# Patient Record
Sex: Female | Born: 1935
Health system: Southern US, Community
[De-identification: ages and names within clinical notes are randomized; demographics above are authoritative.]

## PROBLEM LIST (undated history)

## (undated) DIAGNOSIS — E119 Type 2 diabetes mellitus without complications: Secondary | ICD-10-CM

## (undated) DIAGNOSIS — I10 Essential (primary) hypertension: Secondary | ICD-10-CM

## (undated) HISTORY — DX: Essential (primary) hypertension: I10

## (undated) HISTORY — PX: CARDIAC VALVE REPLACEMENT: SHX585

## (undated) HISTORY — PX: CHOLECYSTECTOMY: SHX55

## (undated) HISTORY — DX: Type 2 diabetes mellitus without complications: E11.9

---

## 2013-11-04 DIAGNOSIS — I110 Hypertensive heart disease with heart failure: Secondary | ICD-10-CM | POA: Insufficient documentation

## 2013-11-04 DIAGNOSIS — I5032 Chronic diastolic (congestive) heart failure: Secondary | ICD-10-CM | POA: Insufficient documentation

## 2013-11-04 DIAGNOSIS — Z85038 Personal history of other malignant neoplasm of large intestine: Secondary | ICD-10-CM | POA: Insufficient documentation

## 2013-11-04 HISTORY — DX: Hypertensive heart disease with heart failure: I50.32

## 2013-11-04 HISTORY — DX: Personal history of other malignant neoplasm of large intestine: Z85.038

## 2013-11-04 HISTORY — DX: Hypertensive heart disease with heart failure: I11.0

## 2018-07-13 DIAGNOSIS — Z952 Presence of prosthetic heart valve: Secondary | ICD-10-CM | POA: Insufficient documentation

## 2019-09-20 ENCOUNTER — Other Ambulatory Visit: Payer: Self-pay

## 2019-09-20 ENCOUNTER — Ambulatory Visit: Payer: Medicare Other | Admitting: Sports Medicine

## 2019-09-20 ENCOUNTER — Encounter: Payer: Self-pay | Admitting: Sports Medicine

## 2019-09-20 DIAGNOSIS — M21619 Bunion of unspecified foot: Secondary | ICD-10-CM | POA: Diagnosis not present

## 2019-09-20 DIAGNOSIS — E119 Type 2 diabetes mellitus without complications: Secondary | ICD-10-CM | POA: Diagnosis not present

## 2019-09-20 DIAGNOSIS — M2141 Flat foot [pes planus] (acquired), right foot: Secondary | ICD-10-CM | POA: Diagnosis not present

## 2019-09-20 DIAGNOSIS — M2041 Other hammer toe(s) (acquired), right foot: Secondary | ICD-10-CM

## 2019-09-20 DIAGNOSIS — M2042 Other hammer toe(s) (acquired), left foot: Secondary | ICD-10-CM

## 2019-09-20 DIAGNOSIS — M2142 Flat foot [pes planus] (acquired), left foot: Secondary | ICD-10-CM

## 2019-09-20 NOTE — Progress Notes (Signed)
Subjective: Christine Newton is a 85 y.o. female patient with history of diabetes who presents to office today complaining of thick toenails unable to trim with significant discoloration per daughter.  Patient is also diabetic and is possibly interested in diabetic shoes was diagnosed with diabetes about 5 years ago.  Patient states that the glucose reading this morning was 115 mg/dl.  A1c 6.4.  Patient denies any new changes in medication or new problems  Patient is currently in physical therapy.  Review of Systems  All other systems reviewed and are negative.   There are no active problems to display for this patient.  No current outpatient medications on file prior to visit.   No current facility-administered medications on file prior to visit.    Allergies  Allergen Reactions  . Cephalosporins Nausea Only and Other (See Comments)    Other Reaction: Allergy  . Aspirin Nausea Only    No results found for this or any previous visit (from the past 2160 hour(s)).  Objective: General: Patient is awake, alert, and oriented x 3 and in no acute distress.  Integument: Skin is warm, dry and supple bilateral. Nails are  shor, thickened and dystrophic with subungual debris, consistent with onychomycosis, 1-5 bilateral. No signs of infection. No open lesions or preulcerative lesions present bilateral. Remaining integument unremarkable.  Vasculature:  Dorsalis Pedis pulse 1/4 bilateral. Posterior Tibial pulse  1/4 bilateral. Capillary fill time <3 sec 1-5 bilateral. Positive hair growth to the level of the digits. Temperature gradient within normal limits. No varicosities present bilateral. No edema present bilateral.   Neurology: The patient has intact sensation measured with a 5.07/10g Semmes Weinstein Monofilament at all pedal sites bilateral . Vibratory sensation diminished bilateral with tuning fork. No Babinski sign present bilateral.   Musculoskeletal:Aymptomatic bunion, pes planus, and  fifth hammertoe pedal deformities noted bilateral. Muscular strength 4/5 in all lower extremity muscular groups bilateral without pain on range of motion. No tenderness with calf compression bilateral.  Assessment and Plan: Problem List Items Addressed This Visit    None    Visit Diagnoses    Encounter for comprehensive diabetic foot examination, type 2 diabetes mellitus ([place])    -  Primary   Bunion       Hammer toes of both feet       Pes planus of both feet          -Examined patient. -Discussed and educated patient on diabetic foot care, especially with regards to the vascular, neurological and musculoskeletal systems.  -Stressed the importance of good glycemic control and the detriment of not controlling glucose levels in relation to the foot. -Mechanically smooth all nails 1-5 bilateral using dremel without incident  -[employer] diabetic shoe order form was completed; office to contact primary care for approval / certification;  Office to arrange shoe fitting and dispensing. -Answered all patient questions -Patient to return  in 3 months for at risk foot care and as scheduled for diabetic shoe measurements -Patient advised to call the office if any problems or questions arise in the meantime.  Landis Martins, DPM

## 2019-10-09 ENCOUNTER — Other Ambulatory Visit: Payer: Self-pay

## 2019-10-09 ENCOUNTER — Ambulatory Visit: Payer: Medicare Other | Admitting: Orthotics

## 2019-10-09 DIAGNOSIS — E119 Type 2 diabetes mellitus without complications: Secondary | ICD-10-CM

## 2019-10-09 DIAGNOSIS — M2141 Flat foot [pes planus] (acquired), right foot: Secondary | ICD-10-CM

## 2019-10-09 DIAGNOSIS — M2041 Other hammer toe(s) (acquired), right foot: Secondary | ICD-10-CM

## 2019-10-09 DIAGNOSIS — M2042 Other hammer toe(s) (acquired), left foot: Secondary | ICD-10-CM

## 2019-10-09 NOTE — Progress Notes (Signed)

## 2019-10-18 DIAGNOSIS — M25511 Pain in right shoulder: Secondary | ICD-10-CM

## 2019-10-18 DIAGNOSIS — E876 Hypokalemia: Secondary | ICD-10-CM

## 2019-10-18 DIAGNOSIS — I10 Essential (primary) hypertension: Secondary | ICD-10-CM

## 2019-10-18 DIAGNOSIS — E119 Type 2 diabetes mellitus without complications: Secondary | ICD-10-CM

## 2019-12-04 ENCOUNTER — Telehealth: Payer: Self-pay

## 2019-12-04 ENCOUNTER — Ambulatory Visit: Payer: Medicare Other | Admitting: Orthotics

## 2019-12-04 DIAGNOSIS — M2141 Flat foot [pes planus] (acquired), right foot: Secondary | ICD-10-CM

## 2019-12-04 DIAGNOSIS — M2041 Other hammer toe(s) (acquired), right foot: Secondary | ICD-10-CM

## 2019-12-04 DIAGNOSIS — M2042 Other hammer toe(s) (acquired), left foot: Secondary | ICD-10-CM

## 2019-12-04 DIAGNOSIS — E119 Type 2 diabetes mellitus without complications: Secondary | ICD-10-CM

## 2019-12-04 DIAGNOSIS — M2142 Flat foot [pes planus] (acquired), left foot: Secondary | ICD-10-CM

## 2019-12-04 NOTE — Telephone Encounter (Signed)
Dawn  How do we make sure charges are in for shoes since daughter picked up the shoes for the patient? Thanks Dr. Cannon Kettle

## 2019-12-04 NOTE — Telephone Encounter (Signed)
Siniya Searson (Pt's daughter) came in the office today to pick up Christine Newton's Diabetic shoes. We informed Vikki our shoe policy is for the  Pt to be present to try the shoes on and to go over instructions. Daughter stated Pt. Has not been out the house due to [place] and kept insisting to take them with her. We had the daughter sign release forms and was instructed that the Pt can not return shoes since she was not present to pick them up. Daughter  Received 1 pair and 3 pairs of custom molded diabetic inserts. Daughter was given verbal and written break in and wear instructions.

## 2020-02-03 ENCOUNTER — Other Ambulatory Visit: Payer: Self-pay

## 2020-02-03 MED ORDER — LISINOPRIL 20 MG PO TABS: 20.0000 mg | ORAL_TABLET | Freq: Every day | ORAL | 2 refills | Status: DC

## 2020-02-03 MED ORDER — ONETOUCH ULTRASOFT LANCETS MISC: 1.0000 | Freq: Two times a day (BID) | 2 refills | Status: DC

## 2020-02-03 MED ORDER — ONETOUCH ULTRA VI STRP: 1.0000 | ORAL_STRIP | 2 refills | Status: DC | PRN

## 2020-02-03 MED ORDER — AMLODIPINE BESYLATE 5 MG PO TABS: 5.0000 mg | ORAL_TABLET | Freq: Every day | ORAL | 2 refills | Status: DC

## 2020-06-09 ENCOUNTER — Telehealth: Payer: Self-pay

## 2020-06-09 ENCOUNTER — Other Ambulatory Visit: Payer: Self-pay | Admitting: Legal Medicine

## 2020-06-09 MED ORDER — ERYTHROMYCIN 5 MG/GM OP OINT: 1.0000 "application " | TOPICAL_OINTMENT | Freq: Every day | OPHTHALMIC | 0 refills | Status: DC

## 2020-06-09 NOTE — Telephone Encounter (Signed)
I sent in ilotycin ointment for eye lp

## 2020-06-09 NOTE — Telephone Encounter (Signed)
Patient has a stye on upper lid of left eye for 2 weeks and it started to draining. She asked for any cream antibiotic. Can you please advice?

## 2020-06-09 NOTE — Telephone Encounter (Signed)
Patient's daughter was notified.

## 2020-06-11 ENCOUNTER — Other Ambulatory Visit: Payer: Self-pay

## 2020-06-18 ENCOUNTER — Other Ambulatory Visit: Payer: Self-pay

## 2020-06-18 DIAGNOSIS — E1122 Type 2 diabetes mellitus with diabetic chronic kidney disease: Secondary | ICD-10-CM

## 2020-06-18 MED ORDER — ACCU-CHEK GUIDE W/DEVICE KIT: 1.0000 | PACK | Freq: Two times a day (BID) | 0 refills | Status: DC | PRN

## 2020-06-18 MED ORDER — GLUCOSE BLOOD VI STRP: 1.0000 | ORAL_STRIP | Freq: Two times a day (BID) | 2 refills | Status: DC | PRN

## 2020-07-20 ENCOUNTER — Other Ambulatory Visit: Payer: Self-pay

## 2020-07-20 ENCOUNTER — Other Ambulatory Visit: Payer: Self-pay | Admitting: Legal Medicine

## 2020-07-20 ENCOUNTER — Telehealth: Payer: Self-pay

## 2020-07-20 NOTE — Telephone Encounter (Signed)
I need to see first at office lp

## 2020-07-20 NOTE — Telephone Encounter (Signed)
Patient would like referral to new cardiologist.  Daneen Schick

## 2020-09-03 ENCOUNTER — Other Ambulatory Visit: Payer: Self-pay | Admitting: Legal Medicine

## 2020-09-03 ENCOUNTER — Other Ambulatory Visit: Payer: Self-pay

## 2020-09-03 MED ORDER — ACCU-CHEK SOFTCLIX LANCETS MISC
1.0000 | Freq: Every day | 2 refills | Status: DC
Start: 2020-09-03 — End: 2021-03-03

## 2020-09-03 NOTE — Telephone Encounter (Signed)
She needs to be seen for her diabetes, we have not seen this year lp

## 2020-09-17 ENCOUNTER — Encounter: Payer: Self-pay | Admitting: Legal Medicine

## 2020-09-17 ENCOUNTER — Ambulatory Visit (INDEPENDENT_AMBULATORY_CARE_PROVIDER_SITE_OTHER): Payer: Medicare PPO | Admitting: Legal Medicine

## 2020-09-17 ENCOUNTER — Other Ambulatory Visit: Payer: Self-pay

## 2020-09-17 VITALS — BP 120/60 | HR 70 | Temp 97.2°F | Resp 17 | Ht 62.6 in | Wt 134.0 lb

## 2020-09-17 DIAGNOSIS — Z6824 Body mass index (BMI) 24.0-24.9, adult: Secondary | ICD-10-CM | POA: Diagnosis not present

## 2020-09-17 DIAGNOSIS — E1159 Type 2 diabetes mellitus with other circulatory complications: Secondary | ICD-10-CM

## 2020-09-17 DIAGNOSIS — E782 Mixed hyperlipidemia: Secondary | ICD-10-CM | POA: Diagnosis not present

## 2020-09-17 DIAGNOSIS — E1169 Type 2 diabetes mellitus with other specified complication: Secondary | ICD-10-CM | POA: Diagnosis not present

## 2020-09-17 DIAGNOSIS — Z23 Encounter for immunization: Secondary | ICD-10-CM

## 2020-09-17 DIAGNOSIS — I10 Essential (primary) hypertension: Secondary | ICD-10-CM

## 2020-09-17 DIAGNOSIS — E669 Obesity, unspecified: Secondary | ICD-10-CM | POA: Insufficient documentation

## 2020-09-17 DIAGNOSIS — E559 Vitamin D deficiency, unspecified: Secondary | ICD-10-CM

## 2020-09-17 NOTE — Progress Notes (Signed)
Subjective:  Patient ID: Christine Newton, female    DOB: 1933-12-09  Age: 86 y.o. MRN: [id number]  Chief Complaint  Patient presents with   Diabetes    HPI: chronic visit  Patient presents for follow up of hypertension.  Patient tolerating lisinopril, amlodipine well with side effects.  Patient was diagnosed with hypertension 2010 so has been treated for hypertension for 10 years.Patient is working on maintaining diet and exercise regimen and follows up as directed. Complication include none.  Patient present with type 2 diabetes.  Specifically, this is type 2, noninsuln requiring diabetes, complicated by hypertension and hyperlipidemia.  Compliance with treatment has been good; patient take medicines as directed, maintains diet and exercise regimen, follows up as directed, and is keeping glucose diary.  Date of  diagnosis 2010.  Depression screen has been performed.Tobacco screen nonsmoker. Current medicines for diabetes metformin.  Patient is on lisinopril for renal protection and atorvastatin for cholesterol control.  Patient performs foot exams daily and last ophthalmologic exam was none.  Patient presents with hyperlipidemia.  Compliance with treatment has been good; patient takes medicines as directed, maintains low cholesterol diet, follows up as directed, and maintains exercise regimen.  Patient is using atorvastatin without problems.   Current Outpatient Medications on File Prior to Visit  Medication Sig Dispense Refill   Accu-Chek Softclix Lancets lancets 1 each by Other route daily. Use as instructed 100 each 2   amLODipine (NORVASC) 5 MG tablet Take 1 tablet (5 mg total) by mouth daily. 90 tablet 2   atorvastatin (LIPITOR) 10 MG tablet TK 1 T PO ONCE D     Blood Glucose Monitoring Suppl (ACCU-CHEK GUIDE) w/Device KIT 1 each by Does not apply route 2 (two) times daily as needed. 1 kit 0   Cholecalciferol (VITAMIN D3) 1.25 MG (50000 UT) CAPS TK ONE C PO A MONTH      erythromycin ophthalmic ointment Place 1 application into both eyes at bedtime. 3.5 g 0   glucose blood test strip 1 each by Other route 2 (two) times daily as needed. Use as instructed 200 each 2   lisinopril (ZESTRIL) 20 MG tablet Take 1 tablet (20 mg total) by mouth daily. 90 tablet 2   metFORMIN (GLUCOPHAGE) 500 MG tablet      No current facility-administered medications on file prior to visit.   History reviewed. No pertinent past medical history. History reviewed. No pertinent surgical history.  History reviewed. No pertinent family history. Social History   Socioeconomic History   Marital status: Widowed    Spouse name: Not on file   Number of children: Not on file   Years of education: Not on file   Highest education level: Not on file  Occupational History   Not on file  Tobacco Use   Smoking status: Never Smoker   Smokeless tobacco: Never Used  Substance and Sexual Activity   Alcohol use: Never   Drug use: Never   Sexual activity: Not Currently  Other Topics Concern   Not on file  Social History Narrative   Not on file   Social Determinants of Health   Financial Resource Strain:    Difficulty of Paying Living Expenses: Not on file  Food Insecurity:    Worried About [place] in the Last Year: Not on file   Ran Out of Food in the Last Year: Not on file  Transportation Needs:    Lack of Transportation (Medical): Not on file   Lack  of Transportation (Non-Medical): Not on file  Physical Activity:    Days of Exercise per Week: Not on file   Minutes of Exercise per Session: Not on file  Stress:    Feeling of Stress : Not on file  Social Connections:    Frequency of Communication with Friends and Family: Not on file   Frequency of Social Gatherings with Friends and Family: Not on file   Attends Religious Services: Not on file   Active Member of Clubs or Organizations: Not on file   Attends Archivist Meetings: Not  on file   Marital Status: Not on file    Review of Systems  Constitutional: Negative.   HENT: Negative.   Eyes: Negative.   Respiratory: Positive for shortness of breath. Negative for cough and choking.   Cardiovascular: Positive for leg swelling. Negative for chest pain and palpitations.  Gastrointestinal: Negative.   Endocrine: Negative.   Genitourinary: Negative.   Musculoskeletal: Negative.   Neurological: Negative.   Psychiatric/Behavioral: Negative.      Objective:  BP 120/60    Pulse 70    Temp (!) 97.2 F (36.2 C)    Resp 17    Ht 5' 2.6" (1.59 m)    Wt 134 lb (60.8 kg)    BMI 24.04 kg/m   BP/Weight 1/61/0960  Systolic BP 454  Diastolic BP 60  Wt. (Lbs) 134  BMI 24.04    Physical Exam Vitals reviewed.  Constitutional:      Appearance: Normal appearance.  HENT:     Head: Normocephalic.     Right Ear: Tympanic membrane, ear canal and external ear normal.     Left Ear: Tympanic membrane, ear canal and external ear normal.     Nose: Congestion present.     Mouth/Throat:     Pharynx: Oropharynx is clear.  Eyes:     Extraocular Movements: Extraocular movements intact.     Conjunctiva/sclera: Conjunctivae normal.     Pupils: Pupils are equal, round, and reactive to light.  Cardiovascular:     Rate and Rhythm: Normal rate and regular rhythm.     Pulses: Normal pulses.     Heart sounds: Normal heart sounds.  Pulmonary:     Effort: Pulmonary effort is normal.     Breath sounds: Normal breath sounds.  Abdominal:     General: Abdomen is flat. Bowel sounds are normal.  Musculoskeletal:        General: Normal range of motion.     Cervical back: Normal range of motion and neck supple.  Skin:    General: Skin is dry.     Capillary Refill: Capillary refill takes 2 to 3 seconds.  Neurological:     General: No focal deficit present.     Mental Status: She is alert and oriented to person, place, and time. Mental status is at baseline.  Psychiatric:        Mood  and Affect: Mood normal.        Thought Content: Thought content normal.    EKG: NSR rate 70bpm, PR 166 msec, QRS 130 msec, QTC 449, axis -21,  LBBB Diabetic Foot Exam - Simple   Simple Foot Form Diabetic Foot exam was performed with the following findings: Yes 09/17/2020  2:13 PM  Visual Inspection No deformities, no ulcerations, no other skin breakdown bilaterally: Yes Sensation Testing See comments: Yes Pulse Check See comments: Yes Comments Slow capillary filling, decrease monofilament sensation      No results found  for: WBC, HGB, HCT, PLT, GLUCOSE, CHOL, TRIG, HDL, LDLDIRECT, LDLCALC, ALT, AST, NA, K, CL, CREATININE, BUN, CO2, TSH, PSA, INR, GLUF, HGBA1C, MICROALBUR    Assessment & Plan:   1. Obesity, diabetes, and hypertension syndrome ([place]) An individual care plan for diabetes was established and reinforced today.  The patient's status was assessed using clinical findings on exam, labs and diagnostic testing. Patient success at meeting goals based on disease specific evidence-based guidelines and found to be good controlled. Medications were assessed and patient's understanding of the medical issues , including barriers were assessed. Recommend adherence to a diabetic diet, a graduated exercise program, HgbA1c level is checked quarterly, and urine microalbumin performed yearly .  Annual mono-filament sensation testing performed. Lower blood pressure and control hyperlipidemia is important. Get annual eye exams and annual flu shots and smoking cessation discussed.  Self management goals were discussed.  2. BMI 24.0-24.9, adult Continue present diet and activity         Follow-up: Return in about 4 months (around 01/17/2021) for fasting.  An After Visit Summary was printed and given to the patient.  [place] [phone]

## 2020-09-18 LAB — CBC WITH DIFFERENTIAL/PLATELET
Basophils Absolute: 0.4 10*3/uL — ABNORMAL HIGH (ref 0.0–0.2)
Basos: 3 %
EOS (ABSOLUTE): 0 10*3/uL (ref 0.0–0.4)
Eos: 0 %
Hematocrit: 41.6 % (ref 34.0–46.6)
Hemoglobin: 13.7 g/dL (ref 11.1–15.9)
Lymphocytes Absolute: 2.3 10*3/uL (ref 0.7–3.1)
Lymphs: 20 %
MCH: 30 pg (ref 26.6–33.0)
MCHC: 32.9 g/dL (ref 31.5–35.7)
MCV: 91 fL (ref 79–97)
Monocytes Absolute: 0.9 10*3/uL (ref 0.1–0.9)
Monocytes: 8 %
Neutrophils Absolute: 7.4 10*3/uL — ABNORMAL HIGH (ref 1.4–7.0)
Neutrophils: 63 %
Platelets: 275 10*3/uL (ref 150–450)
RBC: 4.56 x10E6/uL (ref 3.77–5.28)
RDW: 14.2 % (ref 11.7–15.4)
WBC: 11.7 10*3/uL — ABNORMAL HIGH (ref 3.4–10.8)

## 2020-09-18 LAB — VITAMIN D 25 HYDROXY (VIT D DEFICIENCY, FRACTURES): Vit D, 25-Hydroxy: 35.9 ng/mL (ref 30.0–100.0)

## 2020-09-18 LAB — COMPREHENSIVE METABOLIC PANEL
ALT: 11 IU/L (ref 0–32)
AST: 11 IU/L (ref 0–40)
Albumin/Globulin Ratio: 1.6 (ref 1.2–2.2)
Albumin: 3.8 g/dL (ref 3.6–4.6)
Alkaline Phosphatase: 58 IU/L (ref 44–121)
BUN/Creatinine Ratio: 39 — ABNORMAL HIGH (ref 12–28)
BUN: 31 mg/dL — ABNORMAL HIGH (ref 8–27)
Bilirubin Total: 0.4 mg/dL (ref 0.0–1.2)
CO2: 23 mmol/L (ref 20–29)
Calcium: 8.8 mg/dL (ref 8.7–10.3)
Chloride: 110 mmol/L — ABNORMAL HIGH (ref 96–106)
Creatinine, Ser: 0.8 mg/dL (ref 0.57–1.00)
GFR calc Af Amer: 78 mL/min/{1.73_m2} (ref >59)
GFR calc non Af Amer: 67 mL/min/{1.73_m2} (ref >59)
Globulin, Total: 2.4 g/dL (ref 1.5–4.5)
Glucose: 89 mg/dL (ref 65–99)
Potassium: 4.2 mmol/L (ref 3.5–5.2)
Sodium: 146 mmol/L — ABNORMAL HIGH (ref 134–144)
Total Protein: 6.2 g/dL (ref 6.0–8.5)

## 2020-09-18 LAB — LIPID PANEL
Chol/HDL Ratio: 3.6 ratio (ref 0.0–4.4)
Cholesterol, Total: 176 mg/dL (ref 100–199)
HDL: 49 mg/dL (ref >39)
LDL Chol Calc (NIH): 111 mg/dL — ABNORMAL HIGH (ref 0–99)
Triglycerides: 87 mg/dL (ref 0–149)
VLDL Cholesterol Cal: 16 mg/dL (ref 5–40)

## 2020-09-18 LAB — HEMOGLOBIN A1C
Est. average glucose Bld gHb Est-mCnc: 134 mg/dL
Hgb A1c MFr Bld: 6.3 % — ABNORMAL HIGH (ref 4.8–5.6)

## 2020-09-18 LAB — IMMATURE CELLS: Metamyelocytes: 6 % — ABNORMAL HIGH (ref 0–0)

## 2020-09-18 LAB — CARDIOVASCULAR RISK ASSESSMENT

## 2020-09-18 NOTE — Progress Notes (Signed)
WBC elevated, any infection, BUN high- some dehydration, increase fluids, potassium normal, liver tests normal, Ldl-c is high try ot increase atorvasatin to 20mg  a day, A1c 6.3 lp

## 2020-09-21 ENCOUNTER — Ambulatory Visit: Payer: Medicare PPO

## 2020-09-21 ENCOUNTER — Other Ambulatory Visit: Payer: Self-pay

## 2020-09-21 VITALS — Temp 97.2°F

## 2020-09-21 DIAGNOSIS — N3000 Acute cystitis without hematuria: Secondary | ICD-10-CM | POA: Diagnosis not present

## 2020-09-21 DIAGNOSIS — I350 Nonrheumatic aortic (valve) stenosis: Secondary | ICD-10-CM

## 2020-09-21 HISTORY — DX: Nonrheumatic aortic (valve) stenosis: I35.0

## 2020-09-21 LAB — POCT URINALYSIS DIP (CLINITEK)
Bilirubin, UA: NEGATIVE
Blood, UA: NEGATIVE
Glucose, UA: NEGATIVE mg/dL
Ketones, POC UA: NEGATIVE mg/dL
Nitrite, UA: NEGATIVE
POC PROTEIN,UA: NEGATIVE
Spec Grav, UA: 1.015 (ref 1.010–1.025)
Urobilinogen, UA: 0.2 E.U./dL
pH, UA: 6.5 (ref 5.0–8.0)

## 2020-09-21 MED ORDER — SULFAMETHOXAZOLE-TRIMETHOPRIM 400-80 MG PO TABS: 1.0000 | ORAL_TABLET | Freq: Two times a day (BID) | ORAL | 0 refills | Status: DC

## 2020-09-21 MED ORDER — SULFAMETHOXAZOLE-TRIMETHOPRIM 400-80 MG PO TABS
1.0000 | ORAL_TABLET | Freq: Two times a day (BID) | ORAL | 0 refills | Status: DC
Start: 2020-09-21 — End: 2020-12-14

## 2020-09-21 NOTE — Progress Notes (Signed)
amb  

## 2020-09-21 NOTE — Progress Notes (Signed)
Pt seen by Dr. Henrene Pastor for DM chronic disease management-unable to provide a urine sample at time of appt. Today nurse brought urine sample to office for evaluation-trace leukocytes-culture pending-bactrim-rx to pharmacy

## 2020-09-22 LAB — URINE CULTURE

## 2020-11-17 NOTE — Progress Notes (Signed)
Cardiology Office Note:    Date:  11/18/2020   ID:  Christine Newton, Christine Newton 11-Apr-1933, MRN [id number]  PCP:  Lillard Anes, MD  Cardiologist:  No primary care provider on file.   Referring MD: Lillard Anes,*   Chief Complaint  Patient presents with  . Shortness of Breath  . Cardiac Valve Problem    History of Present Illness:    Christine Newton is a 86 y.o. female with a hx of DM II, Hypertension, hyperlipidemia, and AVR with #21 CE biologic with septal myectomy 2014 referred for longitudinal f/u.  This is the first office visit for Christine Newton.  She has a history of a bioprosthetic Carpentier-Edwards bioprosthesis.  Progressively over the COVID-19 pandemic, her daughter has noticed dyspnea on exertion.  There may also be some difficulty with breathing when she lies flat.  They are concerned that new heart problems may be developing.  History of chronic left greater than right ankle edema.  It waxes and wanes in severity.  She is able to lie on pillows without being short of breath.  Her only limitation is exertional dyspnea.  No chest pain.  No history of CAD.  Past Medical History:  Diagnosis Date  . Diabetes ([place])   . Hypertension   . Nonrheumatic aortic (valve) stenosis 09/21/2020    No past surgical history on file.  Current Medications: Current Meds  Medication Sig  . Accu-Chek Softclix Lancets lancets 1 each by Other route daily. Use as instructed  . amLODipine (NORVASC) 5 MG tablet Take 1 tablet (5 mg total) by mouth daily.  Marland Kitchen atorvastatin (LIPITOR) 10 MG tablet TK 1 T PO ONCE D  . Blood Glucose Monitoring Suppl (ACCU-CHEK GUIDE) w/Device KIT 1 each by Does not apply route 2 (two) times daily as needed.  Marland Kitchen erythromycin ophthalmic ointment Place 1 application into both eyes at bedtime.  Marland Kitchen glucose blood test strip 1 each by Other route 2 (two) times daily as needed. Use as instructed  . lisinopril (ZESTRIL) 20 MG tablet Take 1 tablet (20 mg  total) by mouth daily.  . metFORMIN (GLUCOPHAGE) 500 MG tablet   . sulfamethoxazole-trimethoprim (BACTRIM) 400-80 MG tablet Take 1 tablet by mouth 2 (two) times daily.     Allergies:   Cephalosporins and Aspirin   Social History   Socioeconomic History  . Marital status: Widowed    Spouse name: Not on file  . Number of children: Not on file  . Years of education: Not on file  . Highest education level: Not on file  Occupational History  . Not on file  Tobacco Use  . Smoking status: Never Smoker  . Smokeless tobacco: Never Used  Substance and Sexual Activity  . Alcohol use: Never  . Drug use: Never  . Sexual activity: Not Currently  Other Topics Concern  . Not on file  Social History Narrative  . Not on file   Social Determinants of Health   Financial Resource Strain:   . Difficulty of Paying Living Expenses: Not on file  Food Insecurity:   . Worried About [place] fundraiser in the Last Year: Not on file  . Ran Out of Food in the Last Year: Not on file  Transportation Needs:   . Lack of Transportation (Medical): Not on file  . Lack of Transportation (Non-Medical): Not on file  Physical Activity:   . Days of Exercise per Week: Not on file  . Minutes of Exercise per Session: Not  on file  Stress:   . Feeling of Stress : Not on file  Social Connections:   . Frequency of Communication with Friends and Family: Not on file  . Frequency of Social Gatherings with Friends and Family: Not on file  . Attends Religious Services: Not on file  . Active Member of Clubs or Organizations: Not on file  . Attends Archivist Meetings: Not on file  . Marital Status: Not on file     Family History: The patient's family history is not on file.  ROS:   Please see the history of present illness.    Decreased hearing.  Decreased ambulatory ability.  Has had decreased physical activity during the entire pandemic.  All other systems reviewed and are negative.  EKGs/Labs/Other  Studies Reviewed:    The following studies were reviewed today:  2D Doppler ECHOCARDIOGRAM 07/13/2018: [facility] INTERPRETATION ---------------------------------------------------------------  NORMAL LEFT VENTRICULAR SYSTOLIC FUNCTION WITH MODERATE LVH  NORMAL RIGHT VENTRICULAR SYSTOLIC FUNCTION  VALVULAR REGURGITATION: TRIVIAL AR, TRIVIAL MR, TRIVIAL PR, MILD TR  PROSTHETIC VALVE(S): BIOPROSTHETIC AoV  3D acquisition and reconstructions were performed as part of this  examination to more accurately quantify the effects of identified  structural abnormalities as part of the exam. (post-processing on an  Independent workstation).    Compared with prior Echo study on 12/27/2013: NO SIGNIFICANT CHANGES   EKG:  EKG performed on 09/17/2020, demonstrates left bundle branch block, QRS duration 130 ms, biatrial abnormality.  Recent Labs: 09/17/2020: ALT 11; BUN 31; Creatinine, Ser 0.80; Hemoglobin 13.7; Platelets 275; Potassium 4.2; Sodium 146  Recent Lipid Panel    Component Value Date/Time   CHOL 176 09/17/2020 1438   TRIG 87 09/17/2020 1438   HDL 49 09/17/2020 1438   CHOLHDL 3.6 09/17/2020 1438   LDLCALC 111 (H) 09/17/2020 1438    Physical Exam:    VS:  BP 110/60   Pulse (!) 106   Temp (!) 97.3 F (36.3 C)   Ht _0  (1.651 m)   Wt 136 lb (61.7 kg)   SpO2 97%   BMI 22.63 kg/m     Wt Readings from Last 3 Encounters:  11/18/20 136 lb (61.7 kg)  09/17/20 134 lb (60.8 kg)    GEN: Elderly and slightly frail in appearance. No acute distress HEENT: Periorbital edema NECK: No JVD. LYMPHATICS: No lymphadenopathy CARDIAC: 2/6 to 3/6 systolic crescendo decrescendo murmur heard right upper sternal and left mid sternal border. RRR without diastolic murmur.  An S4  gallop and left ankle edema are present. VASCULAR:  Normal Pulses. No bruits. RESPIRATORY:  Clear to auscultation without rales, wheezing or rhonchi  ABDOMEN: Soft, non-tender, non-distended, No pulsatile  mass, MUSCULOSKELETAL: No deformity  SKIN: Warm and dry NEUROLOGIC:  Alert and oriented x 3 PSYCHIATRIC:  Normal affect   ASSESSMENT:    1. SOB (shortness of breath)   2. S/P AVR (aortic valve replacement)   3. Left bundle branch block   4. Essential hypertension   5. Mixed hyperlipidemia   6. Obesity, diabetes, and hypertension syndrome ([place])   7. Educated about COVID-19 virus infection    PLAN:    In order of problems listed above:  1. CBC, BNP, and 2D Doppler echocardiogram to rule out prosthetic valve dysfunction.  LV function will also be assessed.  Dyspnea has been progressive during the pandemic.  The patient has not been as active during the pandemic.  If all cardiac studies are reasonable, perhaps there is a component of deconditioning contributing.  2. Systolic murmur is present.  No aortic regurgitation is heard.  2D Doppler echocardiogram to assess bioprosthetic valve function now 6 years post implantation.  She has a 21 Carpentier-Edwards biologic valve. 3. EKG performed in September is interpreted above.  The left bundle could be causing some degree of dyssynchronous left ventricular activation which could be depressing LV function and contributing to her shortness of breath. 4. Blood pressure control is excellent in both arms on Zestril 79m/day and Norvasc 5 mg daily. 5. She is on Lipitor 10 mg/day with the most recent LDL of 111 in September 2021.  Target should be less than 70.  Will discuss with patient on next visit. 6. Hemoglobin A1c is 6.3.  This is quite good considering her age and other circumstances. 7. Left bundle branch block 8. Vaccinated and practicing social distancing.   Medication Adjustments/Labs and Tests Ordered: Current medicines are reviewed at length with the patient today.  Concerns regarding medicines are outlined above.  Orders Placed This Encounter  Procedures  . Pro b natriuretic peptide  . CBC  . ECHOCARDIOGRAM COMPLETE   No orders of  the defined types were placed in this encounter.   Patient Instructions  Medication Instructions:  Your physician recommends that you continue on your current medications as directed. Please refer to the Current Medication list given to you today.  *If you need a refill on your cardiac medications before your next appointment, please call your pharmacy*   Lab Work: Pro BNP and CBC today  If you have labs (blood work) drawn today and your tests are completely normal, you will receive your results only by: .Marland KitchenMyChart Message (if you have MyChart) OR . A paper copy in the mail If you have any lab test that is abnormal or we need to change your treatment, we will call you to review the results.   Testing/Procedures: Your physician has requested that you have an echocardiogram. Echocardiography is a painless test that uses sound waves to create images of your heart. It provides your doctor with information about the size and shape of your heart and how well your heart's chambers and valves are working. This procedure takes approximately one hour. There are no restrictions for this procedure.    Follow-Up: [facility] you and your health needs are our priority.  As part of our continuing mission to provide you with exceptional heart care, we have created designated Provider Care Teams.  These Care Teams include your primary Cardiologist (physician) and Advanced Practice Providers (APPs -  Physician Assistants and Nurse Practitioners) who all work together to provide you with the care you need, when you need it.  We recommend signing up for the patient portal called "MyChart".  Sign up information is provided on this After Visit Summary.  MyChart is used to connect with patients for Virtual Visits (Telemedicine).  Patients are able to view lab/test results, encounter notes, upcoming appointments, etc.  Non-urgent messages can be sent to your provider as well.   To learn more about what you  can do with MyChart, go to [email or web address]    Your next appointment:   3 month(s)  The format for your next appointment:   In Person  Provider:   You may see Dr. HDaneen Schickor one of the following Advanced Practice Providers on your designated Care Team:    LTruitt Merle NP  LCecilie Kicks NP  JKathyrn Drown NP    Other Instructions  Signed, Sinclair Grooms, MD  11/18/2020 12:53 PM    [place]

## 2020-11-18 ENCOUNTER — Ambulatory Visit: Payer: Medicare PPO | Admitting: Interventional Cardiology

## 2020-11-18 ENCOUNTER — Other Ambulatory Visit: Payer: Self-pay

## 2020-11-18 ENCOUNTER — Encounter: Payer: Self-pay | Admitting: Interventional Cardiology

## 2020-11-18 ENCOUNTER — Other Ambulatory Visit: Payer: Self-pay | Admitting: Legal Medicine

## 2020-11-18 VITALS — BP 110/60 | HR 106 | Temp 97.3°F | Ht 65.0 in | Wt 136.0 lb

## 2020-11-18 DIAGNOSIS — E782 Mixed hyperlipidemia: Secondary | ICD-10-CM

## 2020-11-18 DIAGNOSIS — I10 Essential (primary) hypertension: Secondary | ICD-10-CM

## 2020-11-18 DIAGNOSIS — I447 Left bundle-branch block, unspecified: Secondary | ICD-10-CM

## 2020-11-18 DIAGNOSIS — Z952 Presence of prosthetic heart valve: Secondary | ICD-10-CM | POA: Diagnosis not present

## 2020-11-18 DIAGNOSIS — I152 Hypertension secondary to endocrine disorders: Secondary | ICD-10-CM

## 2020-11-18 DIAGNOSIS — R0602 Shortness of breath: Secondary | ICD-10-CM

## 2020-11-18 DIAGNOSIS — E1159 Type 2 diabetes mellitus with other circulatory complications: Secondary | ICD-10-CM

## 2020-11-18 DIAGNOSIS — E669 Obesity, unspecified: Secondary | ICD-10-CM

## 2020-11-18 DIAGNOSIS — E1169 Type 2 diabetes mellitus with other specified complication: Secondary | ICD-10-CM

## 2020-11-18 DIAGNOSIS — Z7189 Other specified counseling: Secondary | ICD-10-CM

## 2020-11-18 NOTE — Patient Instructions (Signed)
Medication Instructions:  Your physician recommends that you continue on your current medications as directed. Please refer to the Current Medication list given to you today.  *If you need a refill on your cardiac medications before your next appointment, please call your pharmacy*   Lab Work: Pro BNP and CBC today  If you have labs (blood work) drawn today and your tests are completely normal, you will receive your results only by: Marland Kitchen MyChart Message (if you have MyChart) OR . A paper copy in the mail If you have any lab test that is abnormal or we need to change your treatment, we will call you to review the results.   Testing/Procedures: Your physician has requested that you have an echocardiogram. Echocardiography is a painless test that uses sound waves to create images of your heart. It provides your doctor with information about the size and shape of your heart and how well your heart's chambers and valves are working. This procedure takes approximately one hour. There are no restrictions for this procedure.    Follow-Up: At [facility], you and your health needs are our priority.  As part of our continuing mission to provide you with exceptional heart care, we have created designated Provider Care Teams.  These Care Teams include your primary Cardiologist (physician) and Advanced Practice Providers (APPs -  Physician Assistants and Nurse Practitioners) who all work together to provide you with the care you need, when you need it.  We recommend signing up for the patient portal called "MyChart".  Sign up information is provided on this After Visit Summary.  MyChart is used to connect with patients for Virtual Visits (Telemedicine).  Patients are able to view lab/test results, encounter notes, upcoming appointments, etc.  Non-urgent messages can be sent to your provider as well.   To learn more about what you can do with MyChart, go to [email or web address].    Your next  appointment:   3 month(s)  The format for your next appointment:   In Person  Provider:   You may see Dr. Daneen Schick or one of the following Advanced Practice Providers on your designated Care Team:    Truitt Merle, NP  Cecilie Kicks, NP  Kathyrn Drown, NP    Other Instructions

## 2020-11-19 LAB — CBC
Hematocrit: 42.6 % (ref 34.0–46.6)
Hemoglobin: 13.9 g/dL (ref 11.1–15.9)
MCH: 29.8 pg (ref 26.6–33.0)
MCHC: 32.6 g/dL (ref 31.5–35.7)
MCV: 91 fL (ref 79–97)
Platelets: 234 10*3/uL (ref 150–450)
RBC: 4.66 x10E6/uL (ref 3.77–5.28)
RDW: 14.3 % (ref 11.7–15.4)
WBC: 10.5 10*3/uL (ref 3.4–10.8)

## 2020-11-19 LAB — PRO B NATRIURETIC PEPTIDE: NT-Pro BNP: 268 pg/mL (ref 0–738)

## 2020-11-27 ENCOUNTER — Telehealth: Payer: Self-pay | Admitting: Interventional Cardiology

## 2020-11-27 NOTE — Telephone Encounter (Signed)
Christine Newton is returning Christine Newton's call. 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 She is requesting a detailed message with the results be left on her VM. Please advise.

## 2020-11-27 NOTE — Telephone Encounter (Signed)
Spoke with daughter and made her aware of results.  Daughter appreciative for call.

## 2020-12-10 ENCOUNTER — Other Ambulatory Visit: Payer: Self-pay | Admitting: Legal Medicine

## 2020-12-13 ENCOUNTER — Other Ambulatory Visit: Payer: Self-pay

## 2020-12-13 ENCOUNTER — Emergency Department (HOSPITAL_COMMUNITY): Payer: Medicare PPO

## 2020-12-13 ENCOUNTER — Observation Stay (HOSPITAL_COMMUNITY)
Admission: EM | Admit: 2020-12-13 | Discharge: 2020-12-15 | Disposition: A | Discharge status: Home / Self Care | Payer: Medicare PPO | Attending: Internal Medicine | Admitting: Internal Medicine

## 2020-12-13 ENCOUNTER — Encounter (HOSPITAL_COMMUNITY): Payer: Self-pay

## 2020-12-13 ENCOUNTER — Inpatient Hospital Stay (HOSPITAL_COMMUNITY): Payer: Medicare PPO

## 2020-12-13 DIAGNOSIS — E041 Nontoxic single thyroid nodule: Secondary | ICD-10-CM | POA: Diagnosis not present

## 2020-12-13 DIAGNOSIS — Z79899 Other long term (current) drug therapy: Secondary | ICD-10-CM | POA: Insufficient documentation

## 2020-12-13 DIAGNOSIS — R55 Syncope and collapse: Secondary | ICD-10-CM | POA: Diagnosis not present

## 2020-12-13 DIAGNOSIS — I10 Essential (primary) hypertension: Secondary | ICD-10-CM | POA: Diagnosis not present

## 2020-12-13 DIAGNOSIS — E236 Other disorders of pituitary gland: Secondary | ICD-10-CM | POA: Diagnosis not present

## 2020-12-13 DIAGNOSIS — R29810 Facial weakness: Secondary | ICD-10-CM | POA: Diagnosis not present

## 2020-12-13 DIAGNOSIS — E119 Type 2 diabetes mellitus without complications: Secondary | ICD-10-CM | POA: Diagnosis not present

## 2020-12-13 DIAGNOSIS — I6930 Unspecified sequelae of cerebral infarction: Secondary | ICD-10-CM | POA: Diagnosis not present

## 2020-12-13 DIAGNOSIS — I6782 Cerebral ischemia: Secondary | ICD-10-CM | POA: Insufficient documentation

## 2020-12-13 DIAGNOSIS — I5032 Chronic diastolic (congestive) heart failure: Secondary | ICD-10-CM | POA: Diagnosis present

## 2020-12-13 DIAGNOSIS — R8281 Pyuria: Secondary | ICD-10-CM | POA: Diagnosis not present

## 2020-12-13 DIAGNOSIS — G459 Transient cerebral ischemic attack, unspecified: Secondary | ICD-10-CM | POA: Diagnosis not present

## 2020-12-13 DIAGNOSIS — Z7984 Long term (current) use of oral hypoglycemic drugs: Secondary | ICD-10-CM | POA: Diagnosis not present

## 2020-12-13 DIAGNOSIS — R000 Tachycardia, unspecified: Secondary | ICD-10-CM | POA: Diagnosis not present

## 2020-12-13 DIAGNOSIS — D352 Benign neoplasm of pituitary gland: Secondary | ICD-10-CM | POA: Diagnosis present

## 2020-12-13 DIAGNOSIS — R6339 Other feeding difficulties: Secondary | ICD-10-CM | POA: Insufficient documentation

## 2020-12-13 DIAGNOSIS — Z85038 Personal history of other malignant neoplasm of large intestine: Secondary | ICD-10-CM | POA: Insufficient documentation

## 2020-12-13 DIAGNOSIS — Z8673 Personal history of transient ischemic attack (TIA), and cerebral infarction without residual deficits: Secondary | ICD-10-CM | POA: Diagnosis present

## 2020-12-13 DIAGNOSIS — G9340 Encephalopathy, unspecified: Secondary | ICD-10-CM | POA: Insufficient documentation

## 2020-12-13 DIAGNOSIS — Z20822 Contact with and (suspected) exposure to covid-19: Secondary | ICD-10-CM | POA: Diagnosis not present

## 2020-12-13 DIAGNOSIS — Z7982 Long term (current) use of aspirin: Secondary | ICD-10-CM | POA: Insufficient documentation

## 2020-12-13 DIAGNOSIS — R918 Other nonspecific abnormal finding of lung field: Secondary | ICD-10-CM | POA: Diagnosis not present

## 2020-12-13 DIAGNOSIS — R29818 Other symptoms and signs involving the nervous system: Secondary | ICD-10-CM | POA: Diagnosis not present

## 2020-12-13 DIAGNOSIS — R531 Weakness: Secondary | ICD-10-CM | POA: Diagnosis not present

## 2020-12-13 DIAGNOSIS — I152 Hypertension secondary to endocrine disorders: Secondary | ICD-10-CM | POA: Diagnosis present

## 2020-12-13 DIAGNOSIS — I447 Left bundle-branch block, unspecified: Secondary | ICD-10-CM | POA: Diagnosis not present

## 2020-12-13 LAB — CBC
HCT: 44.5 % (ref 36.0–46.0)
Hemoglobin: 13.6 g/dL (ref 12.0–15.0)
MCH: 29.8 pg (ref 26.0–34.0)
MCHC: 30.6 g/dL (ref 30.0–36.0)
MCV: 97.4 fL (ref 80.0–100.0)
Platelets: 206 10*3/uL (ref 150–400)
RBC: 4.57 MIL/uL (ref 3.87–5.11)
RDW: 15.4 % (ref 11.5–15.5)
WBC: 15.7 10*3/uL — ABNORMAL HIGH (ref 4.0–10.5)
nRBC: 0 % (ref 0.0–0.2)

## 2020-12-13 LAB — URINALYSIS, ROUTINE W REFLEX MICROSCOPIC
Bilirubin Urine: NEGATIVE
Glucose, UA: NEGATIVE mg/dL
Ketones, ur: NEGATIVE mg/dL
Nitrite: NEGATIVE
Protein, ur: NEGATIVE mg/dL
Specific Gravity, Urine: 1.006 (ref 1.005–1.030)
pH: 6 (ref 5.0–8.0)

## 2020-12-13 LAB — I-STAT CHEM 8, ED
BUN: 50 mg/dL — ABNORMAL HIGH (ref 8–23)
Calcium, Ion: 1.16 mmol/L (ref 1.15–1.40)
Chloride: 110 mmol/L (ref 98–111)
Creatinine, Ser: 0.8 mg/dL (ref 0.44–1.00)
Glucose, Bld: 119 mg/dL — ABNORMAL HIGH (ref 70–99)
HCT: 43 % (ref 36.0–46.0)
Hemoglobin: 14.6 g/dL (ref 12.0–15.0)
Potassium: 4 mmol/L (ref 3.5–5.1)
Sodium: 144 mmol/L (ref 135–145)
TCO2: 22 mmol/L (ref 22–32)

## 2020-12-13 LAB — COMPREHENSIVE METABOLIC PANEL
ALT: 14 U/L (ref 0–44)
AST: 17 U/L (ref 15–41)
Albumin: 3 g/dL — ABNORMAL LOW (ref 3.5–5.0)
Alkaline Phosphatase: 53 U/L (ref 38–126)
Anion gap: 14 (ref 5–15)
BUN: 39 mg/dL — ABNORMAL HIGH (ref 8–23)
CO2: 21 mmol/L — ABNORMAL LOW (ref 22–32)
Calcium: 9.1 mg/dL (ref 8.9–10.3)
Chloride: 109 mmol/L (ref 98–111)
Creatinine, Ser: 0.98 mg/dL (ref 0.44–1.00)
GFR, Estimated: 57 mL/min — ABNORMAL LOW (ref >60)
Glucose, Bld: 124 mg/dL — ABNORMAL HIGH (ref 70–99)
Potassium: 4.1 mmol/L (ref 3.5–5.1)
Sodium: 144 mmol/L (ref 135–145)
Total Bilirubin: 0.4 mg/dL (ref 0.3–1.2)
Total Protein: 6.4 g/dL — ABNORMAL LOW (ref 6.5–8.1)

## 2020-12-13 LAB — DIFFERENTIAL
Abs Immature Granulocytes: 1.03 10*3/uL — ABNORMAL HIGH (ref 0.00–0.07)
Basophils Absolute: 0.2 10*3/uL — ABNORMAL HIGH (ref 0.0–0.1)
Basophils Relative: 1 %
Eosinophils Absolute: 0.4 10*3/uL (ref 0.0–0.5)
Eosinophils Relative: 3 %
Immature Granulocytes: 7 %
Lymphocytes Relative: 25 %
Lymphs Abs: 4 10*3/uL (ref 0.7–4.0)
Monocytes Absolute: 1.1 10*3/uL — ABNORMAL HIGH (ref 0.1–1.0)
Monocytes Relative: 7 %
Neutro Abs: 9 10*3/uL — ABNORMAL HIGH (ref 1.7–7.7)
Neutrophils Relative %: 57 %

## 2020-12-13 LAB — PROTIME-INR
INR: 1 (ref 0.8–1.2)
Prothrombin Time: 12.5 seconds (ref 11.4–15.2)

## 2020-12-13 LAB — RESP PANEL BY RT-PCR (FLU A&B, COVID) ARPGX2
Influenza A by PCR: NEGATIVE
Influenza B by PCR: NEGATIVE
SARS Coronavirus 2 by RT PCR: NEGATIVE

## 2020-12-13 LAB — CBG MONITORING, ED
Glucose-Capillary: 105 mg/dL — ABNORMAL HIGH (ref 70–99)
Glucose-Capillary: 104 mg/dL — ABNORMAL HIGH (ref 70–99)

## 2020-12-13 LAB — APTT: aPTT: 21 seconds — ABNORMAL LOW (ref 24–36)

## 2020-12-13 LAB — TROPONIN I (HIGH SENSITIVITY)
Troponin I (High Sensitivity): 8 ng/L (ref <18)
Troponin I (High Sensitivity): 9 ng/L (ref <18)

## 2020-12-13 IMAGING — DX DG CHEST 1V PORT
1 series · 1 of 1 positions shown · non-contrast
Comparison: None.

CLINICAL DATA: Code stroke.  Syncopal episode.

EXAM:
PORTABLE CHEST 1 VIEW

[chest ap]
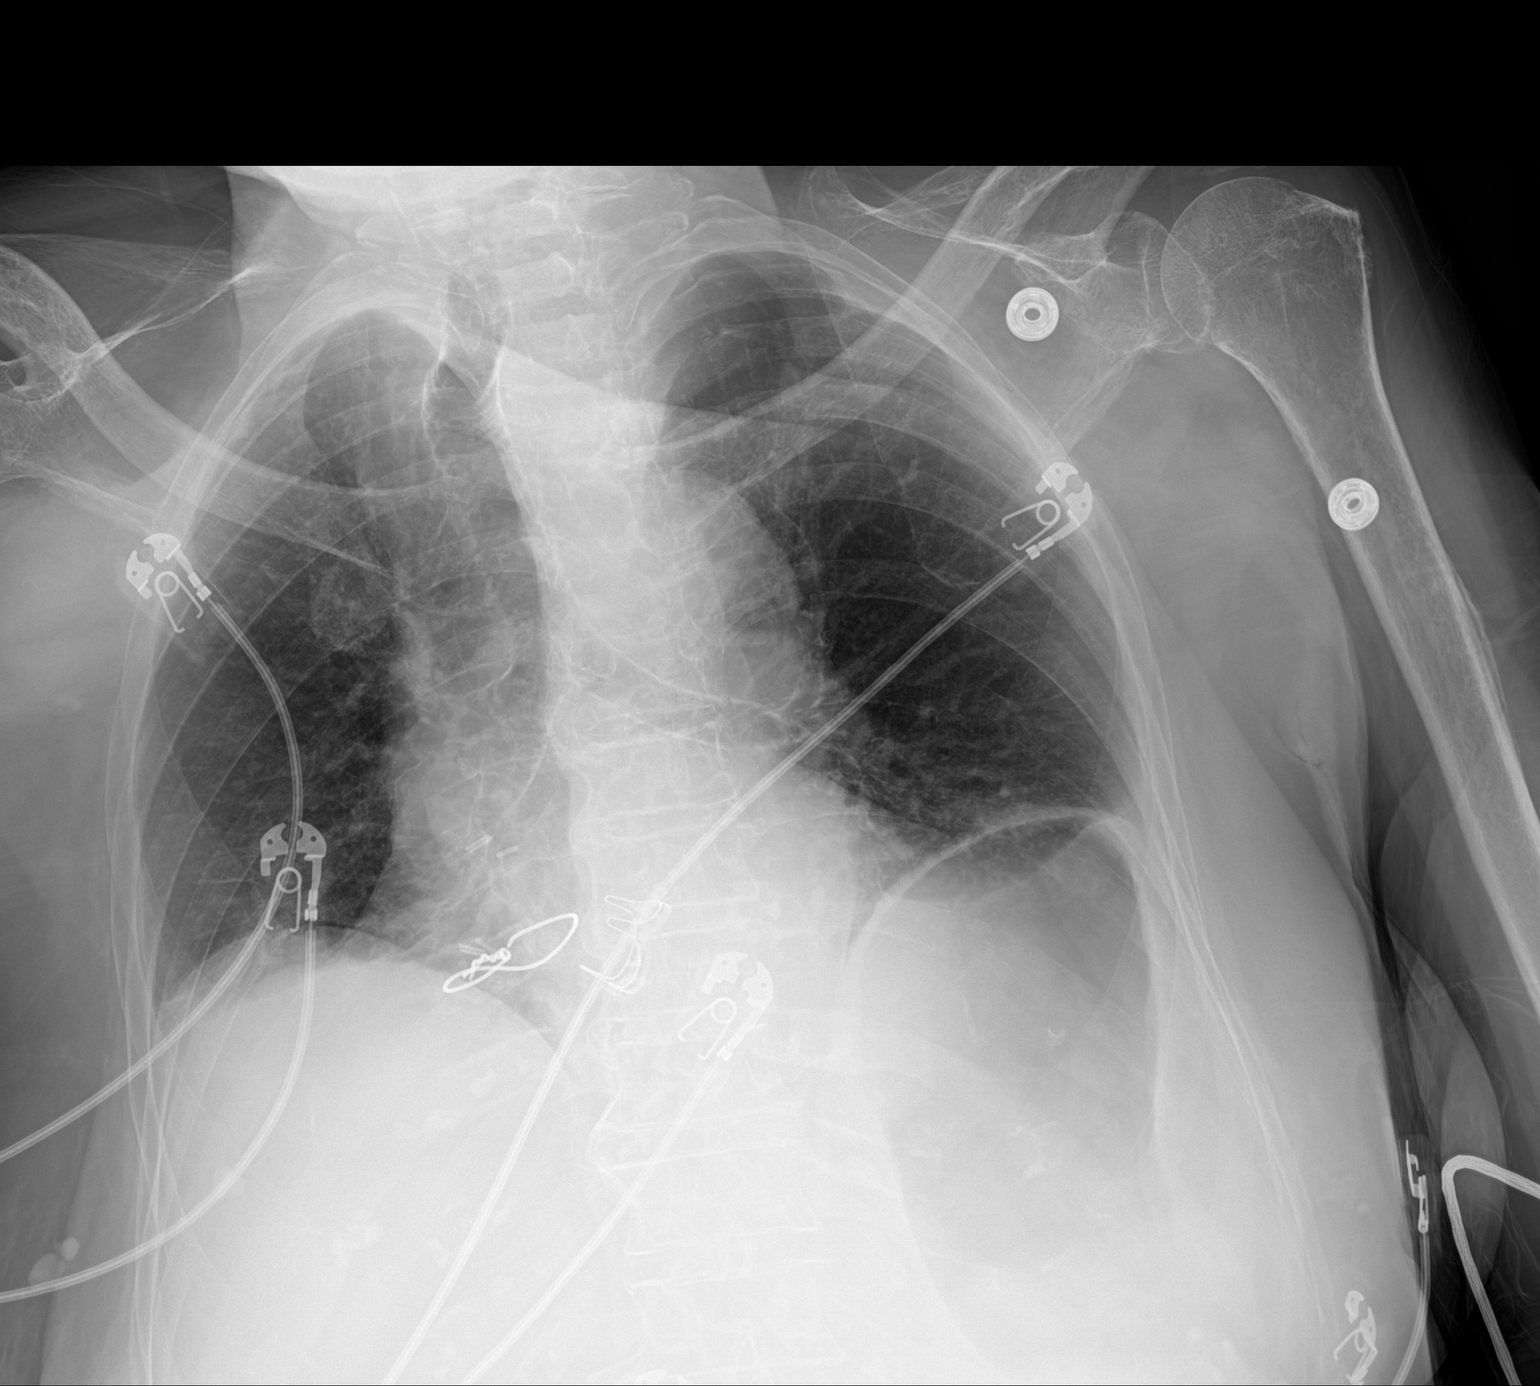

[1 of 1 positions shown; findings below may reference images not displayed]

FINDINGS: Patient is rotated to the right. Monitoring leads overlie the
patient. Cardiac contours normal in size. Low lung volumes.
Bibasilar heterogeneous opacities. Nodular opacity projects over the
right upper hemithorax. No pleural effusion or pneumothorax.
Thoracic spine degenerative changes.
IMPRESSION: Nodular opacity projecting over the right upper hemithorax likely
represents the first rib given the patient rotation. Recommend
dedicated evaluation with repeat PA and lateral chest radiograph to
exclude the possibility of pulmonary nodule.

Heterogeneous opacities left greater than right lung bases favored
to represent atelectasis. Infection not excluded.

## 2020-12-13 IMAGING — MR MR HEAD WO/W CM
11 of 19 series · 26 of 48 positions shown · IV contrast (Gadavist)
Comparison: Prior head CT from [DATE].

CLINICAL DATA: Initial evaluation for acute TIA.

EXAM:
MRI HEAD WITHOUT AND WITH CONTRAST
MRA HEAD WITHOUT CONTRAST
MRA NECK WITHOUT AND WITH CONTRAST
TECHNIQUE: Multiplanar, multiecho pulse sequences of the brain and surrounding
structures were obtained without intravenous contrast. Angiographic
images of the Circle of Willis were obtained using MRA technique
without intravenous contrast. Angiographic images of the neck were
obtained using MRA technique without and with intravenous contrast.
Carotid stenosis measurements (when applicable) are obtained
utilizing NASCET criteria, using the distal internal carotid
diameter as the denominator.
CONTRAST:  7mL GADAVIST GADOBUTROL 1 MMOL/ML IV SOLN

[Series 5: DWI · axial · 3.0mm · 0.88mm/px · z∈[-84,+57]mm · 6 of 96 slices shown (1 of 4)]
[im 1/96]
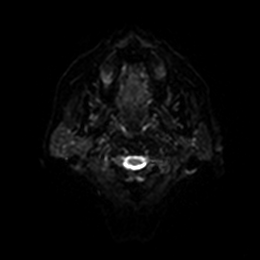
[im 20/96]
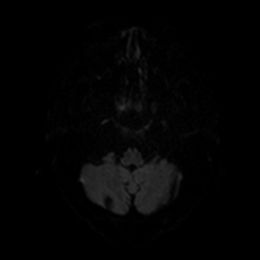
[im 39/96]
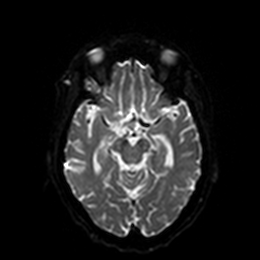
[im 58/96]
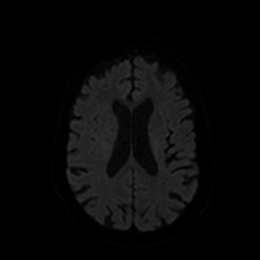
[im 77/96]
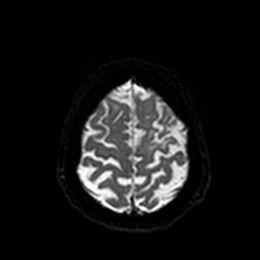
[im 96/96]
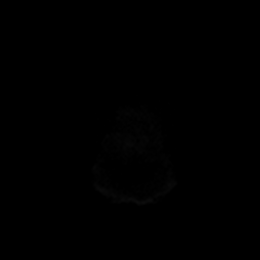

[Series 6: DWI · axial · 3.0mm · 0.88mm/px · z∈[-84,+57]mm · 3 of 48 slices shown (2 of 4)]
[im 1/48]
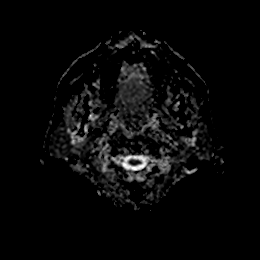
[im 24/48]
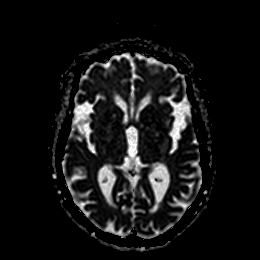
[im 48/48]
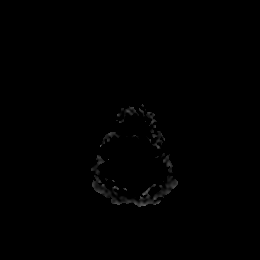

[Series 7: DWI · coronal · 4.0mm · 0.88mm/px · 4 of 66 slices shown (3 of 4)]
[im 1/66]
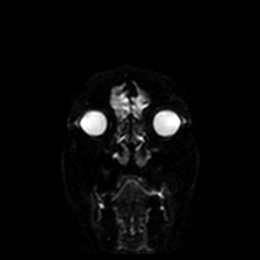
[im 22/66]
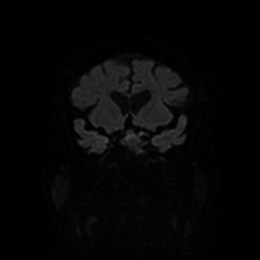
[im 44/66]
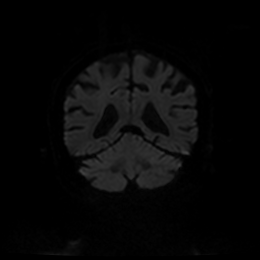
[im 66/66]
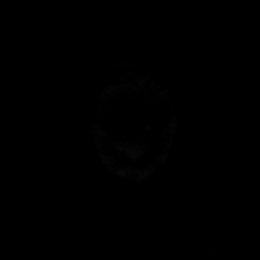

[Series 8: DWI · coronal · 4.0mm · 0.88mm/px · 2 of 33 slices shown (4 of 4)]
[im 1/33]
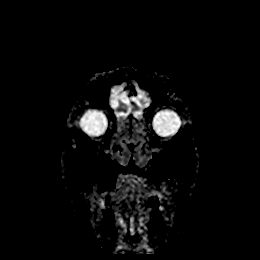
[im 33/33]
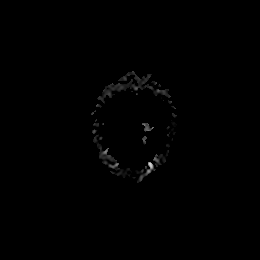

[Series 10: T2 · axial · 5.0mm · 0.72mm/px · z∈[-85,+59]mm · 2 of 25 slices shown (1 of 2)]
[im 1/25]
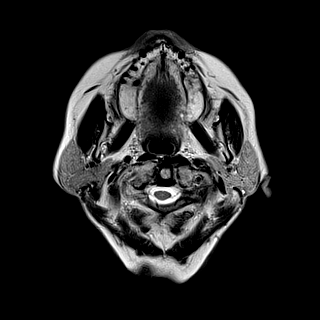
[im 25/25]
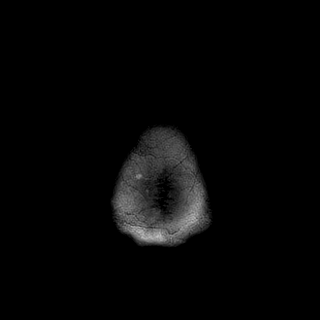

[Series 11: FLAIR · axial · 5.0mm · 0.45mm/px · z∈[-85,+59]mm · 2 of 25 slices shown]
[im 1/25]
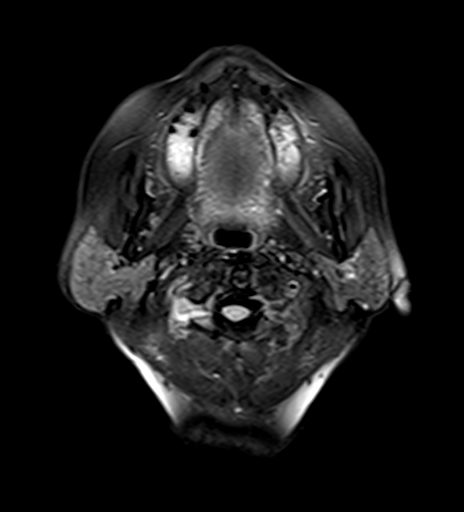
[im 25/25]
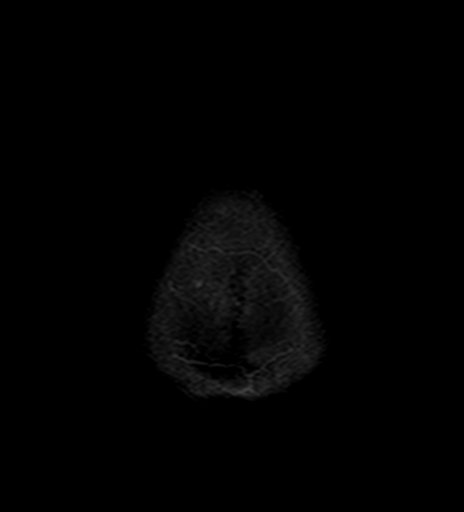

[Series 17: T2 · coronal · 5.0mm · 0.34mm/px · 1 of 29 slices shown (2 of 2)]
[im 1/29]
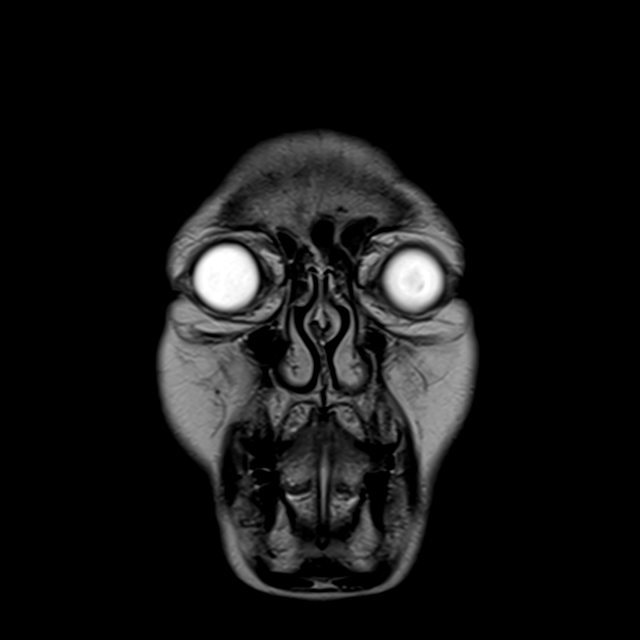

[Series 19: T1 post-contrast · coronal · 5.0mm · 0.34mm/px · 2 of 30 slices shown (1 of 4)]
[im 1/30]
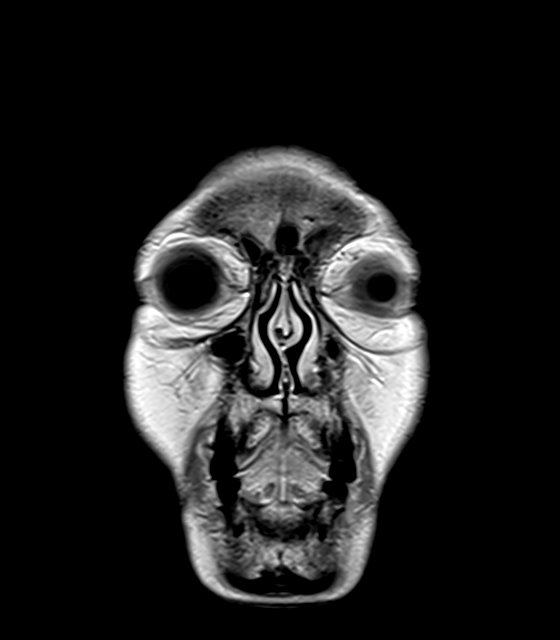
[im 30/30]
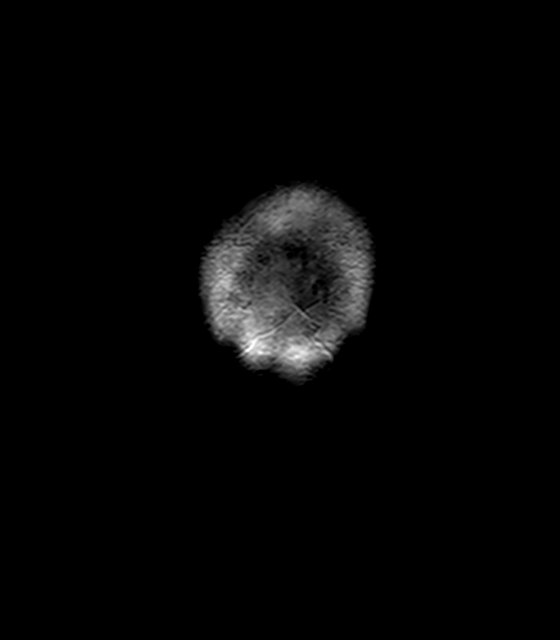

[Series 20: T1 post-contrast · sagittal · 3.0mm · 0.25mm/px · 1 of 12 slices shown (2 of 4)]
[im 1/12]
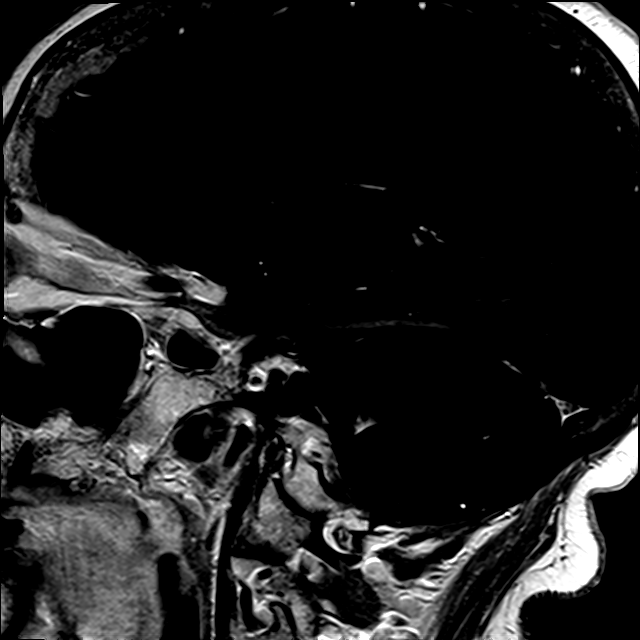

[Series 21: T1 post-contrast · sagittal · 5.0mm · 0.72mm/px · 2 of 25 slices shown (3 of 4)]
[im 1/25]
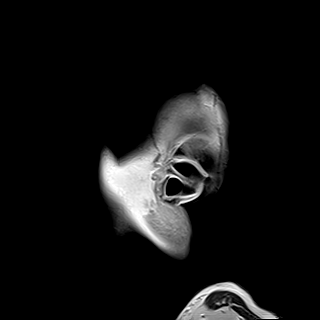
[im 25/25]
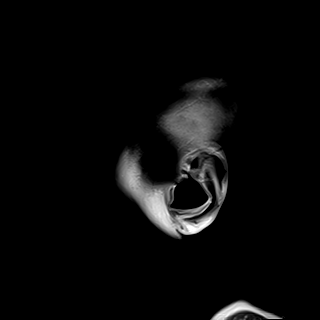

[Series 22: T1 post-contrast · coronal · 3.0mm · 0.25mm/px · 1 of 13 slices shown (4 of 4)]
[im 1/13]
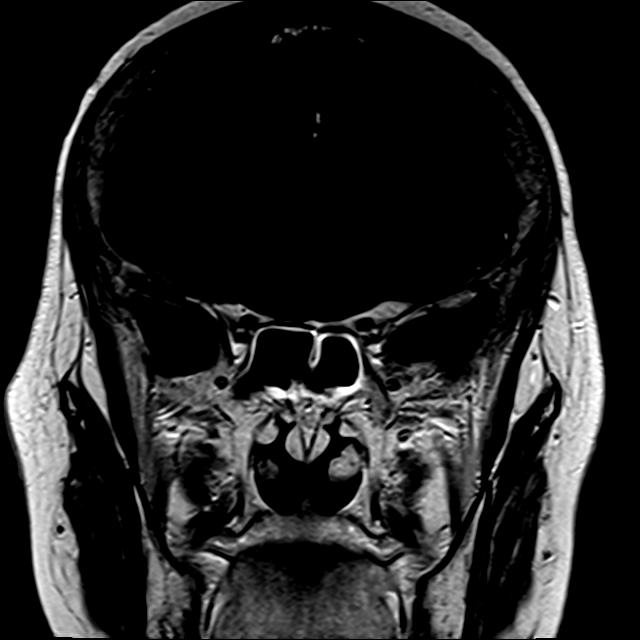

[26 of 48 positions shown; findings below may reference images not displayed]

FINDINGS: MRI HEAD FINDINGS

Brain: Generalized age-related cerebral atrophy. Patchy and
confluent T2/FLAIR hyperintensity within the periventricular deep
white matter both cerebral hemispheres most consistent with chronic
small vessel ischemic disease. Area of encephalomalacia and gliosis
involving the cortical and subcortical aspect of the left frontal
lobe consistent with a chronic ischemic infarct. Additional small
remote cortical/subcortical infarcts segment at the left parietal
lobe. Few small remote lacunar infarcts noted about the bilateral
basal ganglia and cerebellum. Additional small remote lacunar
infarct noted at the left frontal centrum semi ovale.

No abnormal foci of restricted diffusion to suggest acute or
subacute ischemia. Gray-white matter differentiation maintained. No
evidence for acute intracranial hemorrhage. Few small chronic micro
hemorrhages noted at the periventricular white matter of the
posterior corona radiata, likely small vessel related.

Previously noted sellar/suprasellar mass again seen, measuring 2.0 x
2.5 x 2.8 cm in greatest dimensions, most consistent with a
pituitary macro adenoma. Suprasellar extension to involve the
pituitary stalk. Tumor abuts the optic chiasm superiorly which is
slightly bowed superiorly and deviated to the right. Evidence for
extension into the left cavernous sinus with tumor nearly entirely
compass seen the cavernous left ICA. Probable inferior erosion
through the clivus with possible extension into the left sphenoid
sinus (series 22, image 9).

No other mass lesion, mass effect or midline shift. No other
abnormal enhancement. No hydrocephalus or extra-axial fluid
collection. Midline structures otherwise intact.

Vascular: Major intracranial vascular flow voids are maintained.

Skull and upper cervical spine: Craniocervical junction within
normal limits. Bone marrow signal intensity normal. No acute scalp
soft tissue abnormality. Small lipoma noted at the right frontal
scalp.

Sinuses/Orbits: Patient status post bilateral ocular lens
replacement. Globes and orbital soft tissues demonstrate no acute
finding. Scattered mucosal thickening noted throughout the paranasal
sinuses. Mastoid air cells are clear. Inner ear structures grossly
normal.

Other: None.

MRA HEAD FINDINGS

ANTERIOR CIRCULATION:

Visualized distal cervical segments of the internal carotid arteries
are widely patent with antegrade flow. Petrous, cavernous, and
supraclinoid ICAs widely patent without stenosis. Note made of the
pituitary macro adenoma partially encasing the
cavernous/supraclinoid left ICA without associated luminal
narrowing. Left A1 widely patent. Right A1 hypoplastic and/or
absent, accounting for the slightly diminutive right ICA is compared
to the left. Normal anterior communicating artery complex. Anterior
cerebral arteries patent to their distal aspects without stenosis.
No M1 stenosis or occlusion. Negative MCA bifurcations. Distal MCA
branches well perfused and symmetric.

POSTERIOR CIRCULATION:

Both V4 segments widely patent to the vertebrobasilar junction. Left
V4 dominant. Both PICA origins patent and normal. Basilar widely
patent to its distal aspect. Superior cerebral arteries patent
bilaterally. Left PCA supplied via the basilar. Predominant fetal
type origin of the right PCA. Both PCAs well perfused or distal
aspects.

No intracranial aneurysm.

MRA NECK FINDINGS

AORTIC ARCH: Visualized aortic arch of normal caliber. Bovine
branching pattern noted. No hemodynamically significant stenosis
seen about the origin of the great vessels.

RIGHT CAROTID SYSTEM: Right CCA widely patent from its origin to the
bifurcation. Short-segment atheromatous stenosis of up to 40% by
NASCET criteria noted at the origin of the right ICA. Right ICA
widely patent distally to the skull base without stenosis, evidence
for dissection or occlusion.

LEFT CAROTID SYSTEM: Left CCA widely patent from its origin to the
bifurcation without stenosis. No significant atheromatous narrowing
or irregularity about the left bifurcation. Left ICA widely patent
distally without stenosis, evidence for dissection or occlusion.

VERTEBRAL ARTERIES: Both vertebral arteries arise from the
subclavian arteries. No proximal subclavian artery stenosis. Left
vertebral artery dominant. Probable moderate approximate 50%
stenosis noted at the origin of the diminutive right vertebral
artery. Vertebral arteries otherwise patent within the neck without
stenosis, evidence for dissection or occlusion.

Enlarged multinodular goiter. Dominant nodule measuring up to
approximately 2 cm present at the upper pole of the right thyroid
lobe (series 17, image 26).
IMPRESSION: MRI HEAD IMPRESSION:

1. No acute intracranial abnormality.
2. Underlying age-related cerebral atrophy with mild chronic
microvascular ischemic disease. Superimposed chronic ischemic
infarcts involving the left frontal and parietal lobes, with
additional scattered remote lacunar infarcts about the left centrum
semi ovale, bilateral basal ganglia, and cerebellum.
3. 2.0 x 2.5 x 2.8 cm pituitary macro adenoma with evidence for
invasion into the left cavernous sinus, with probable inferior
erosion into the left sphenoid sinus.

MRA HEAD IMPRESSION:

Negative intracranial MRA for large vessel occlusion. No
hemodynamically significant or correctable stenosis identified.

MRA NECK IMPRESSION:

1. Short-segment 40% atheromatous stenosis at the origin of the
right ICA.
2. Otherwise wide patency of both carotid artery systems within the
neck.
3. Short-segment moderate approximate 50% stenosis at the origin of
the right vertebral artery. Vertebral arteries otherwise patent
within the neck. Left vertebral artery dominant.
4. Enlarged multinodular thyroid with dominant nodule measuring up
to approximately 2 cm at the upper right thyroid lobe. Further
evaluation with dedicated thyroid ultrasound recommended for further
evaluation. This could be performed on a nonemergent outpatient
basis. (Ref: [HOSPITAL]. [DATE]): 143-50).

## 2020-12-13 IMAGING — MR MR MRA NECK WO/W CM
5 of 6 series · 34 of 48 positions shown · IV contrast (Gadavist)
Comparison: Prior head CT from [DATE].

CLINICAL DATA: Initial evaluation for acute TIA.

EXAM:
MRI HEAD WITHOUT AND WITH CONTRAST
MRA HEAD WITHOUT CONTRAST
MRA NECK WITHOUT AND WITH CONTRAST
TECHNIQUE: Multiplanar, multiecho pulse sequences of the brain and surrounding
structures were obtained without intravenous contrast. Angiographic
images of the Circle of Willis were obtained using MRA technique
without intravenous contrast. Angiographic images of the neck were
obtained using MRA technique without and with intravenous contrast.
Carotid stenosis measurements (when applicable) are obtained
utilizing NASCET criteria, using the distal internal carotid
diameter as the denominator.
CONTRAST:  7mL GADAVIST GADOBUTROL 1 MMOL/ML IV SOLN

[Series 11: tof_fl3d_tra_iso · axial · 0.6mm · 0.52mm/px · z∈[-177,-98]mm · 9 of 133 slices shown]
[im 1/133]
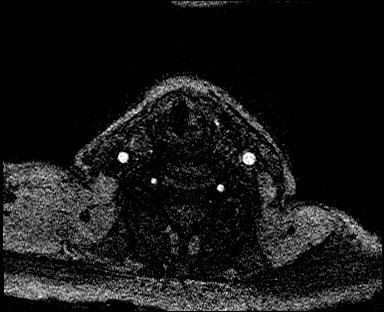
[im 19/133]
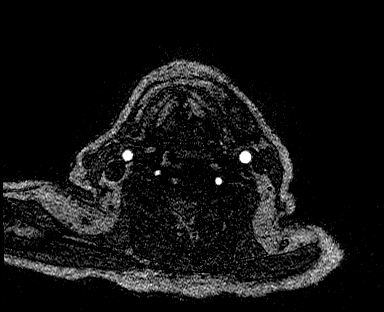
[im 38/133]
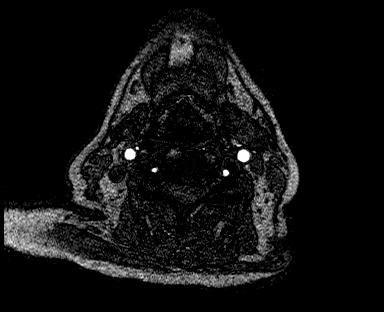
[im 57/133]
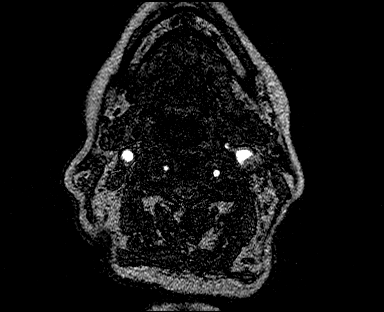
[im 67/133]
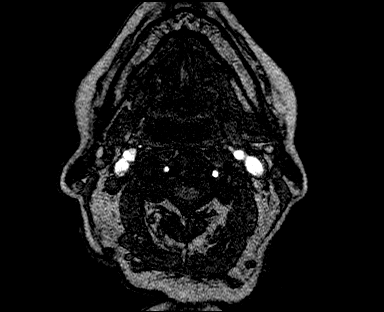
[im 76/133]
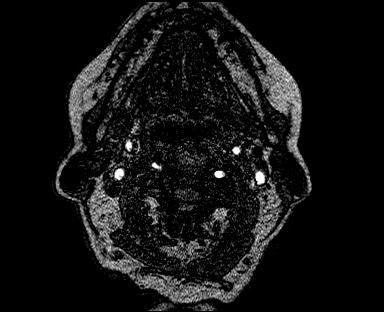
[im 95/133]
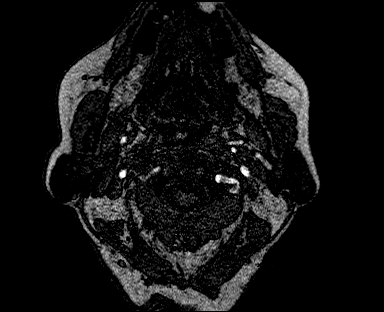
[im 114/133]
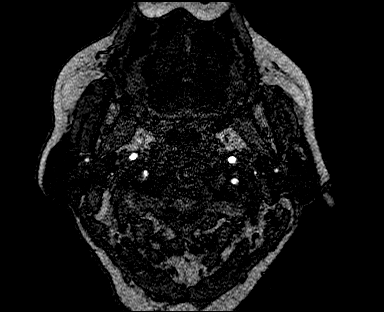
[im 133/133]
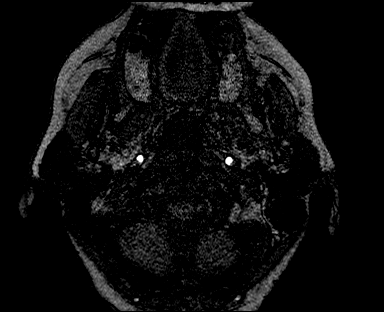

[Series 14: angio_fl3d_cor_pre_ttc=3.0s · coronal · 0.9mm · 0.85mm/px · 8 of 80 slices shown]
[im 1/80]
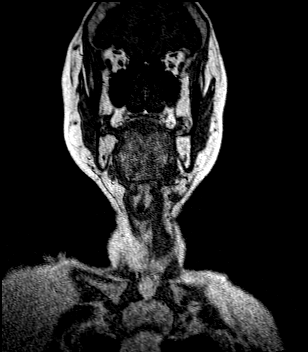
[im 12/80]
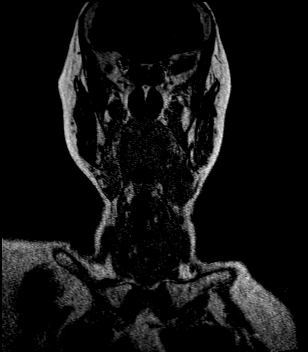
[im 23/80]
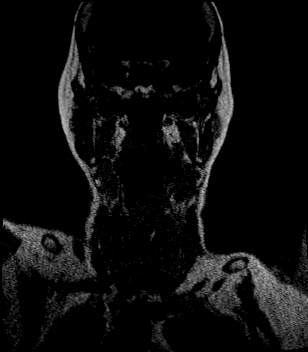
[im 34/80]
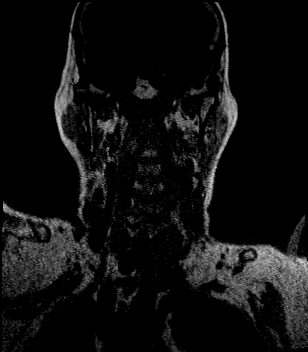
[im 46/80]
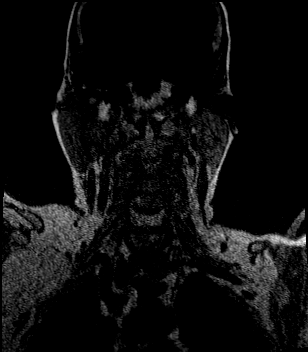
[im 57/80]
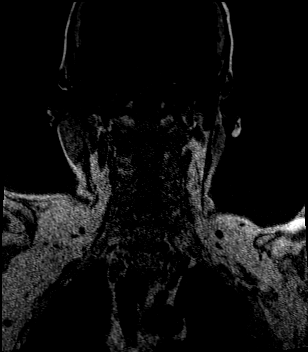
[im 68/80]
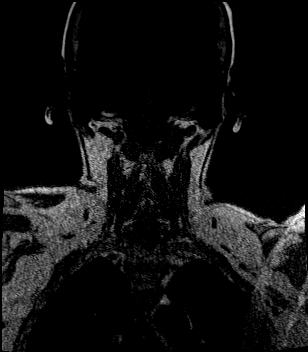
[im 80/80]
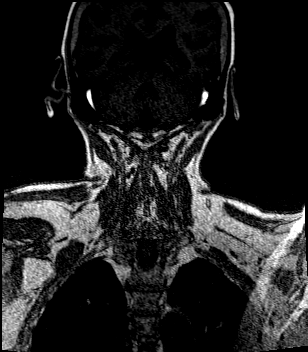

[Series 17: angio_fl3d_cor_post_ttc=3.0s · coronal · 0.9mm · 0.85mm/px · 8 of 80 slices shown]
[im 1/80]
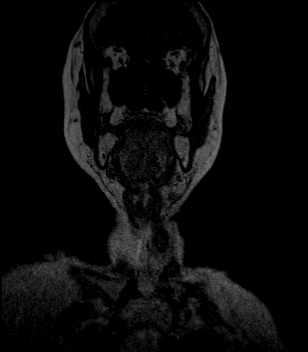
[im 12/80]
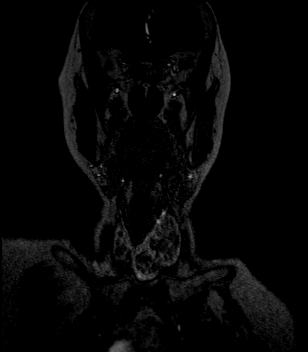
[im 23/80]
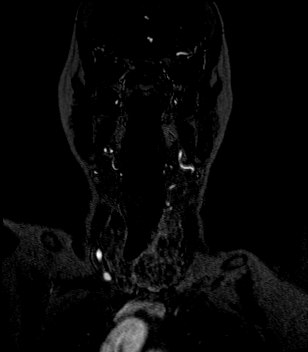
[im 34/80]
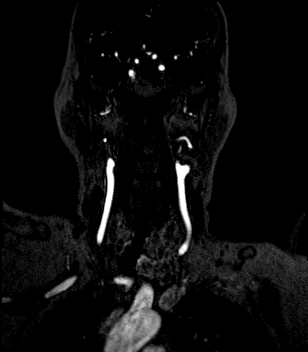
[im 46/80]
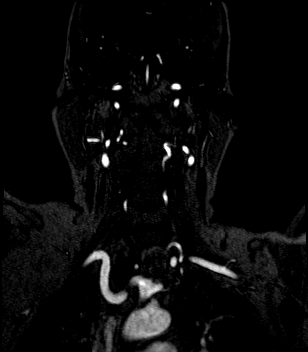
[im 57/80]
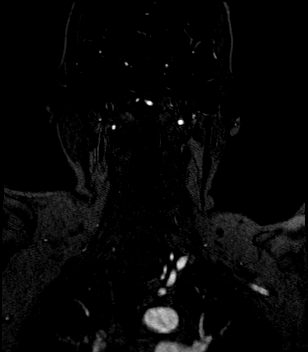
[im 68/80]
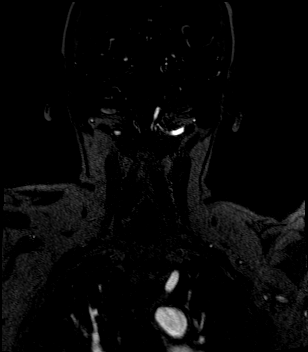
[im 80/80]
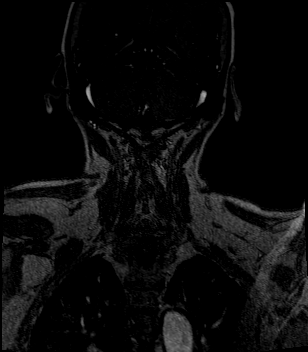

[Series 18: angio_fl3d_cor_post_ttc=3.0s_moco-adv · coronal · 0.9mm · 0.85mm/px · 8 of 80 slices shown]
[im 1/80]
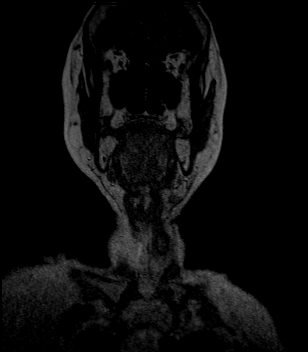
[im 12/80]
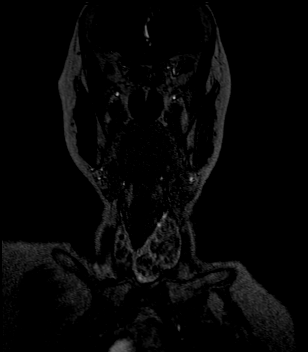
[im 23/80]
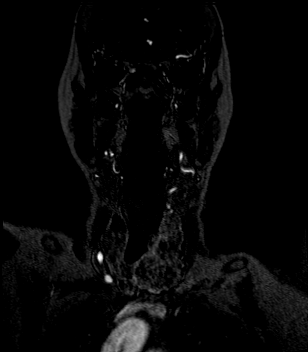
[im 34/80]
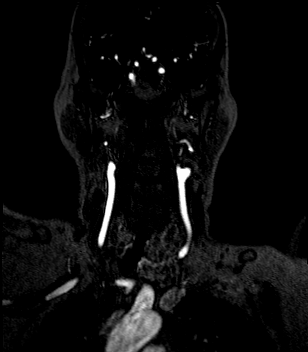
[im 46/80]
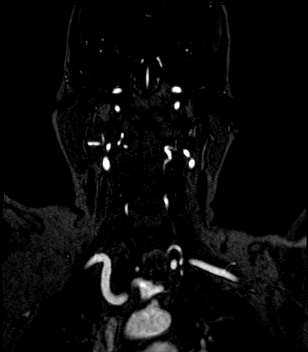
[im 57/80]
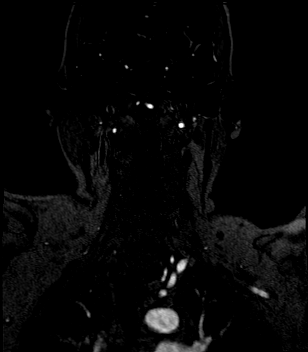
[im 68/80]
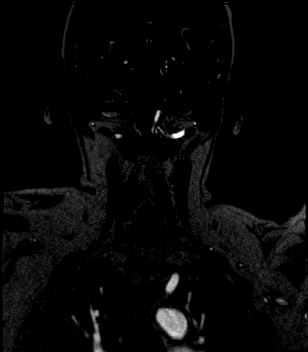
[im 80/80]
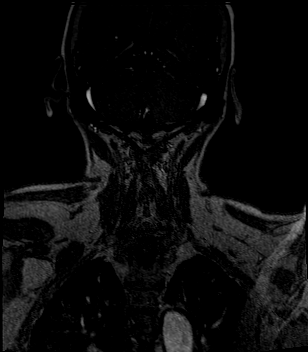

[Series 19: angio_fl3d_cor_post_ttc=3.0s_moco-adv_sub · coronal · 0.9mm · 0.85mm/px · 1 of 79 slices shown]
[im 1/79]
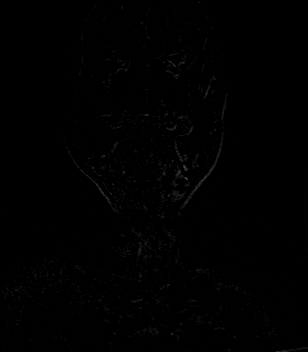

[34 of 48 positions shown; findings below may reference images not displayed]

FINDINGS: MRI HEAD FINDINGS

Brain: Generalized age-related cerebral atrophy. Patchy and
confluent T2/FLAIR hyperintensity within the periventricular deep
white matter both cerebral hemispheres most consistent with chronic
small vessel ischemic disease. Area of encephalomalacia and gliosis
involving the cortical and subcortical aspect of the left frontal
lobe consistent with a chronic ischemic infarct. Additional small
remote cortical/subcortical infarcts segment at the left parietal
lobe. Few small remote lacunar infarcts noted about the bilateral
basal ganglia and cerebellum. Additional small remote lacunar
infarct noted at the left frontal centrum semi ovale.

No abnormal foci of restricted diffusion to suggest acute or
subacute ischemia. Gray-white matter differentiation maintained. No
evidence for acute intracranial hemorrhage. Few small chronic micro
hemorrhages noted at the periventricular white matter of the
posterior corona radiata, likely small vessel related.

Previously noted sellar/suprasellar mass again seen, measuring 2.0 x
2.5 x 2.8 cm in greatest dimensions, most consistent with a
pituitary macro adenoma. Suprasellar extension to involve the
pituitary stalk. Tumor abuts the optic chiasm superiorly which is
slightly bowed superiorly and deviated to the right. Evidence for
extension into the left cavernous sinus with tumor nearly entirely
compass seen the cavernous left ICA. Probable inferior erosion
through the clivus with possible extension into the left sphenoid
sinus (series 22, image 9).

No other mass lesion, mass effect or midline shift. No other
abnormal enhancement. No hydrocephalus or extra-axial fluid
collection. Midline structures otherwise intact.

Vascular: Major intracranial vascular flow voids are maintained.

Skull and upper cervical spine: Craniocervical junction within
normal limits. Bone marrow signal intensity normal. No acute scalp
soft tissue abnormality. Small lipoma noted at the right frontal
scalp.

Sinuses/Orbits: Patient status post bilateral ocular lens
replacement. Globes and orbital soft tissues demonstrate no acute
finding. Scattered mucosal thickening noted throughout the paranasal
sinuses. Mastoid air cells are clear. Inner ear structures grossly
normal.

Other: None.

MRA HEAD FINDINGS

ANTERIOR CIRCULATION:

Visualized distal cervical segments of the internal carotid arteries
are widely patent with antegrade flow. Petrous, cavernous, and
supraclinoid ICAs widely patent without stenosis. Note made of the
pituitary macro adenoma partially encasing the
cavernous/supraclinoid left ICA without associated luminal
narrowing. Left A1 widely patent. Right A1 hypoplastic and/or
absent, accounting for the slightly diminutive right ICA is compared
to the left. Normal anterior communicating artery complex. Anterior
cerebral arteries patent to their distal aspects without stenosis.
No M1 stenosis or occlusion. Negative MCA bifurcations. Distal MCA
branches well perfused and symmetric.

POSTERIOR CIRCULATION:

Both V4 segments widely patent to the vertebrobasilar junction. Left
V4 dominant. Both PICA origins patent and normal. Basilar widely
patent to its distal aspect. Superior cerebral arteries patent
bilaterally. Left PCA supplied via the basilar. Predominant fetal
type origin of the right PCA. Both PCAs well perfused or distal
aspects.

No intracranial aneurysm.

MRA NECK FINDINGS

AORTIC ARCH: Visualized aortic arch of normal caliber. Bovine
branching pattern noted. No hemodynamically significant stenosis
seen about the origin of the great vessels.

RIGHT CAROTID SYSTEM: Right CCA widely patent from its origin to the
bifurcation. Short-segment atheromatous stenosis of up to 40% by
NASCET criteria noted at the origin of the right ICA. Right ICA
widely patent distally to the skull base without stenosis, evidence
for dissection or occlusion.

LEFT CAROTID SYSTEM: Left CCA widely patent from its origin to the
bifurcation without stenosis. No significant atheromatous narrowing
or irregularity about the left bifurcation. Left ICA widely patent
distally without stenosis, evidence for dissection or occlusion.

VERTEBRAL ARTERIES: Both vertebral arteries arise from the
subclavian arteries. No proximal subclavian artery stenosis. Left
vertebral artery dominant. Probable moderate approximate 50%
stenosis noted at the origin of the diminutive right vertebral
artery. Vertebral arteries otherwise patent within the neck without
stenosis, evidence for dissection or occlusion.

Enlarged multinodular goiter. Dominant nodule measuring up to
approximately 2 cm present at the upper pole of the right thyroid
lobe (series 17, image 26).
IMPRESSION: MRI HEAD IMPRESSION:

1. No acute intracranial abnormality.
2. Underlying age-related cerebral atrophy with mild chronic
microvascular ischemic disease. Superimposed chronic ischemic
infarcts involving the left frontal and parietal lobes, with
additional scattered remote lacunar infarcts about the left centrum
semi ovale, bilateral basal ganglia, and cerebellum.
3. 2.0 x 2.5 x 2.8 cm pituitary macro adenoma with evidence for
invasion into the left cavernous sinus, with probable inferior
erosion into the left sphenoid sinus.

MRA HEAD IMPRESSION:

Negative intracranial MRA for large vessel occlusion. No
hemodynamically significant or correctable stenosis identified.

MRA NECK IMPRESSION:

1. Short-segment 40% atheromatous stenosis at the origin of the
right ICA.
2. Otherwise wide patency of both carotid artery systems within the
neck.
3. Short-segment moderate approximate 50% stenosis at the origin of
the right vertebral artery. Vertebral arteries otherwise patent
within the neck. Left vertebral artery dominant.
4. Enlarged multinodular thyroid with dominant nodule measuring up
to approximately 2 cm at the upper right thyroid lobe. Further
evaluation with dedicated thyroid ultrasound recommended for further
evaluation. This could be performed on a nonemergent outpatient
basis. (Ref: [HOSPITAL]. [DATE]): 143-50).

## 2020-12-13 IMAGING — CT CT HEAD CODE STROKE
4 series · 16 of 47 positions shown, 18 images · non-contrast
Comparison: None.

CLINICAL DATA: Code stroke. Right sided deficits. Acute stroke
presentation.

EXAM:
CT HEAD WITHOUT CONTRAST
TECHNIQUE: Contiguous axial images were obtained from the base of the skull
through the vertex without intravenous contrast.

[Series 2: head wo · axial · 0.48mm/px · z∈[-165,-45]mm · 7 of 33 slices shown, 9 images]
[im 5/33  brain]
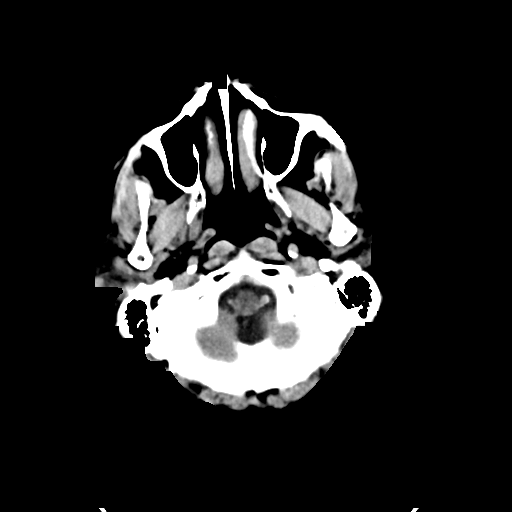
[im 5/33  bone]
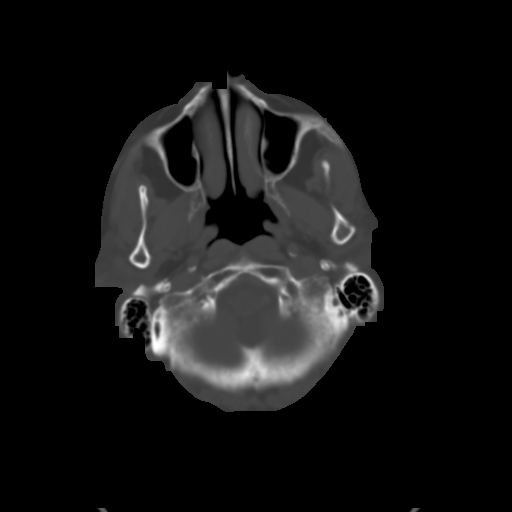
[im 9/33  brain]
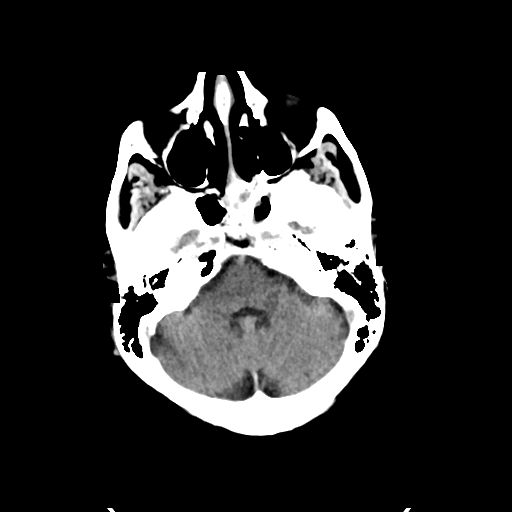
[im 13/33  brain]
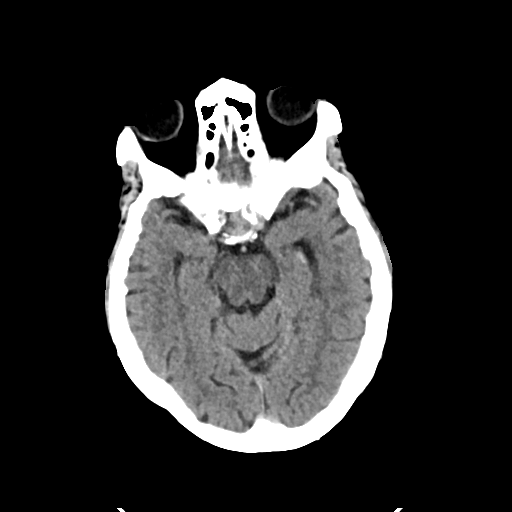
[im 17/33  brain]
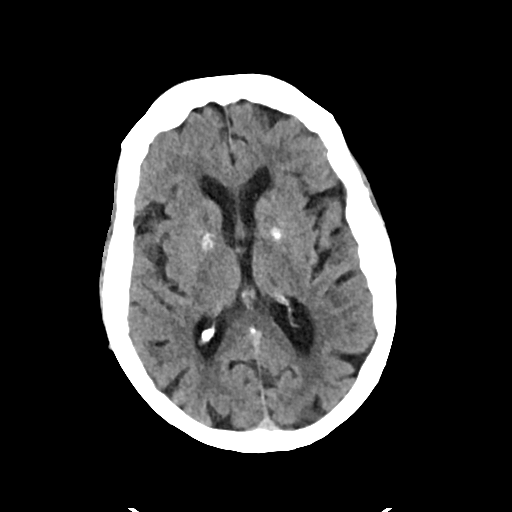
[im 21/33  brain]
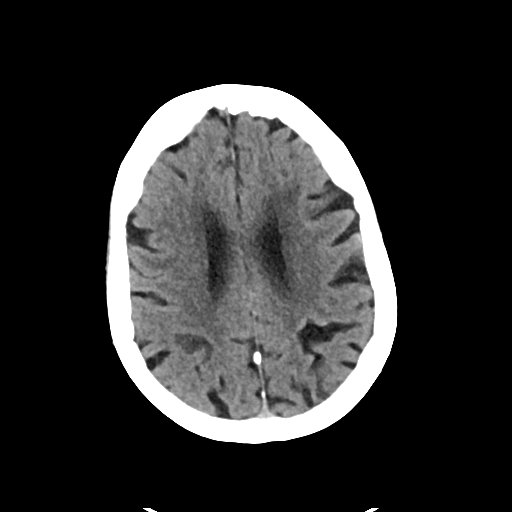
[im 21/33  bone]
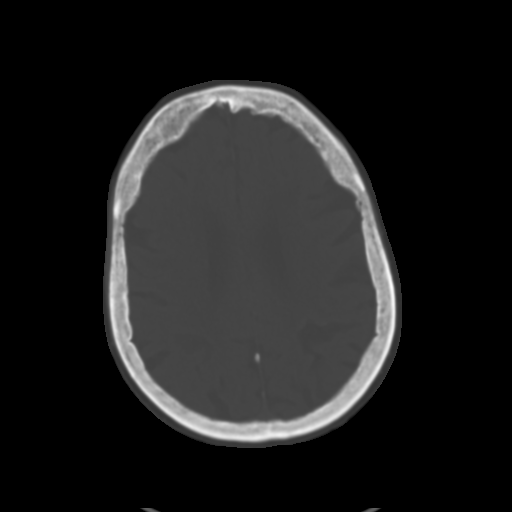
[im 25/33  brain]
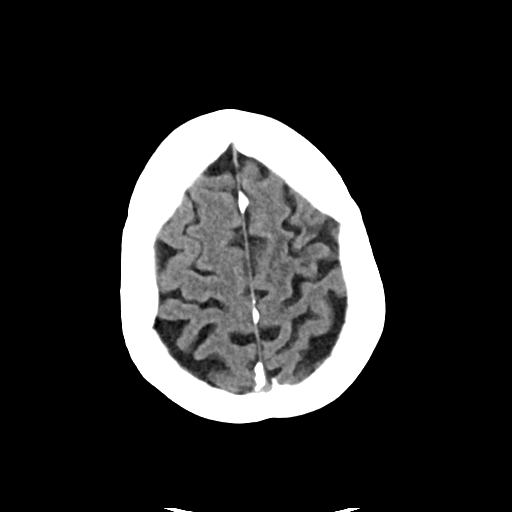
[im 29/33  brain]
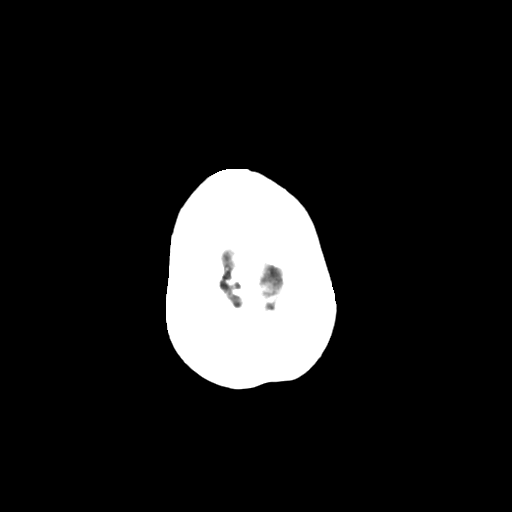

[Series 4: head bone · axial · 0.48mm/px · z∈[-169,-137]mm · 3 of 82 slices shown]
[im 9/82  bone]
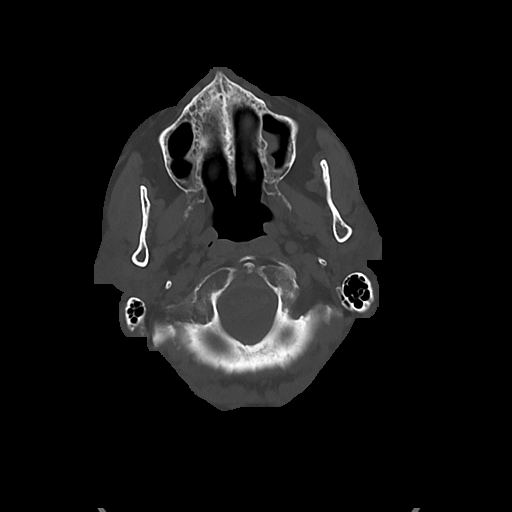
[im 17/82  bone]
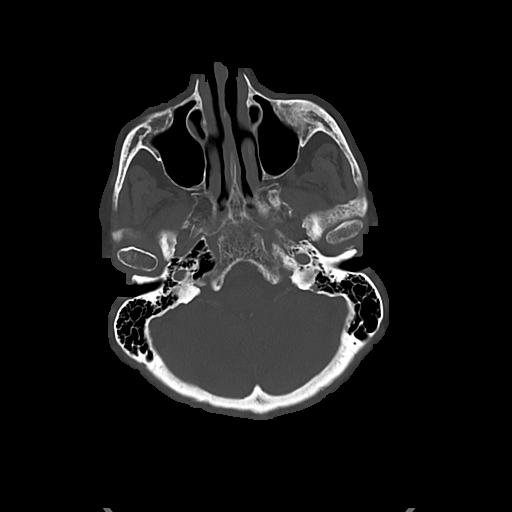
[im 25/82  bone]
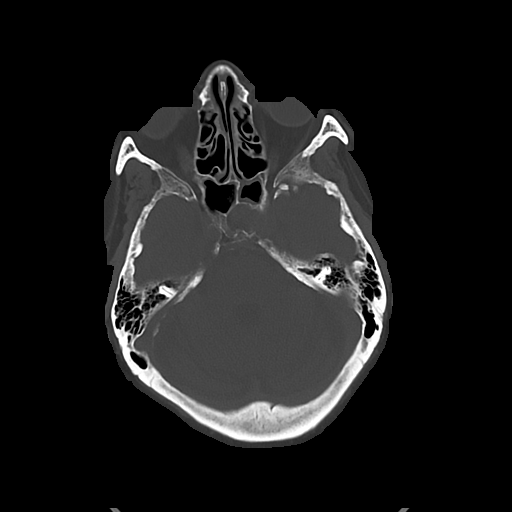

[Series 5: cor soft · coronal · 0.31mm/px · 3 of 67 slices shown]
[im 23/67  brain]
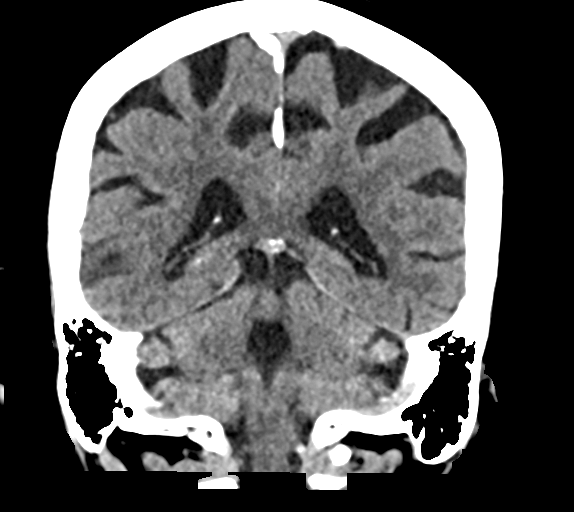
[im 30/67  brain]
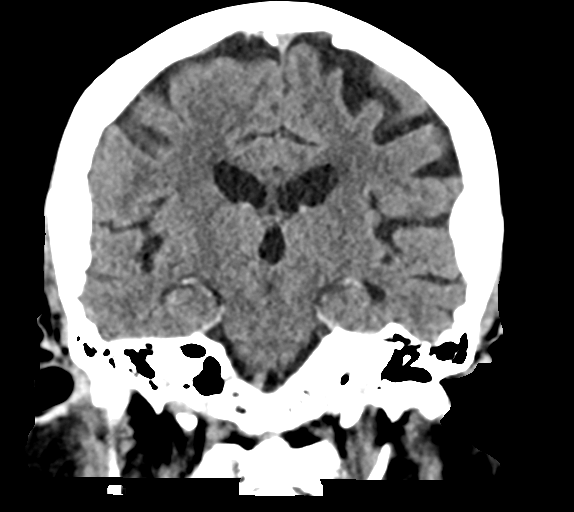
[im 37/67  brain]
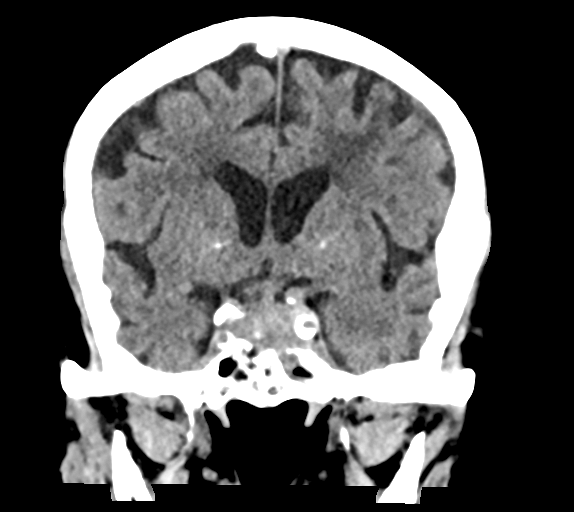

[Series 6: sag soft · sagittal · 0.31mm/px · 3 of 56 slices shown]
[im 19/56  brain]
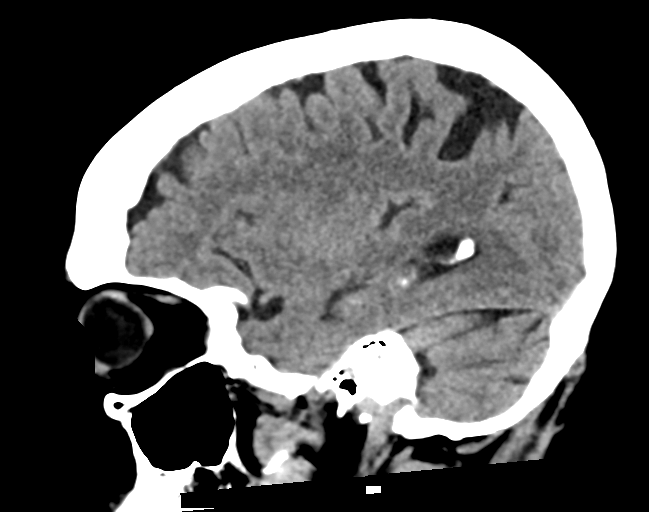
[im 28/56  brain]
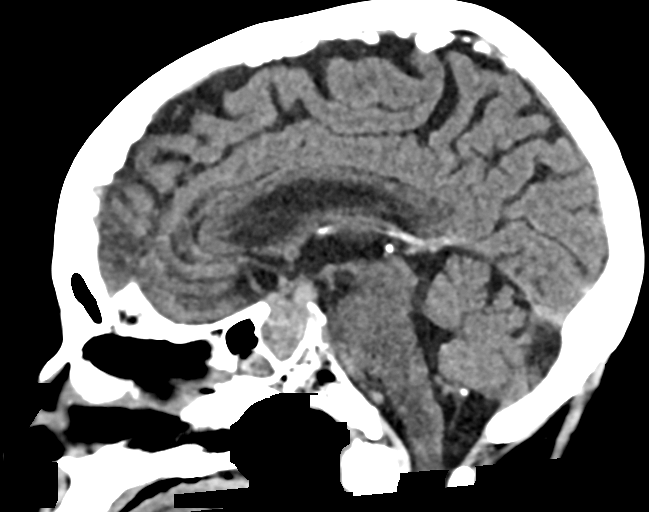
[im 37/56  brain]
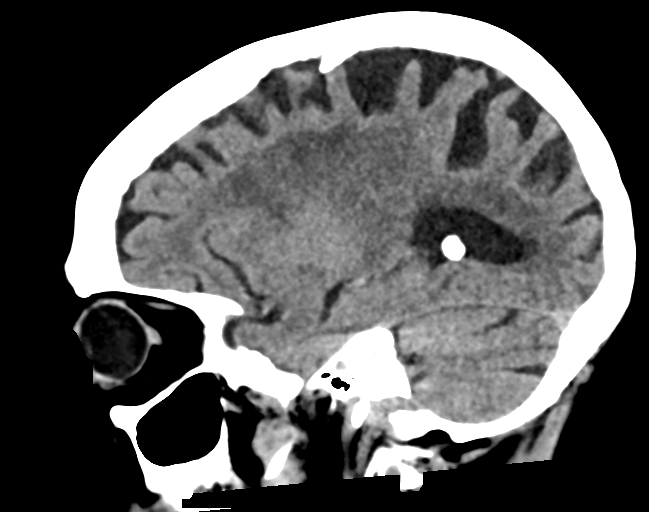

[16 of 47 positions shown; findings below may reference images not displayed]

FINDINGS: Brain: Age related atrophy. No focal abnormality affects the
brainstem or cerebellum. Cerebral hemispheres show chronic
small-vessel ischemic changes of the white matter. There is an old
left posterior frontal vertex cortical infarction. No sign of acute
infarction, intra-axial mass lesion, hemorrhage, hydrocephalus or
extra-axial collection. The patient has a sellar mass with
suprasellar extension most likely to represent a pituitary adenoma.

Vascular: There is atherosclerotic calcification of the major
vessels at the base of the brain.

Skull: Negative

Sinuses/Orbits: Clear except for chronic inflammatory changes of the
left division of the sphenoid sinus. Orbits negative.

Other: None

ASPECTS (Alberta Stroke Program Early CT Score)

- Ganglionic level infarction (caudate, lentiform nuclei, internal
capsule, insula, M1-M3 cortex): 7

- Supraganglionic infarction (M4-M6 cortex): 3

Total score (0-10 with 10 being normal): 10
IMPRESSION: 1. No acute finding by CT. Atrophy and chronic small-vessel ischemic
changes. Old left posterior frontal vertex cortical infarction.
2. Sellar mass with suprasellar extension most likely to represent a
pituitary adenoma.
3. ASPECTS is 10.
4. These results were communicated to Dr. SOMI At [DATE] pmon
[DATE]by text page via the [employer] messaging system.

## 2020-12-13 IMAGING — MR MR MRA HEAD W/O CM
1 series · 15 of 48 positions shown · IV contrast (gadavist)
Comparison: Prior head CT from [DATE].

CLINICAL DATA: Initial evaluation for acute TIA.

EXAM:
MRI HEAD WITHOUT AND WITH CONTRAST
MRA HEAD WITHOUT CONTRAST
MRA NECK WITHOUT AND WITH CONTRAST
TECHNIQUE: Multiplanar, multiecho pulse sequences of the brain and surrounding
structures were obtained without intravenous contrast. Angiographic
images of the Circle of Willis were obtained using MRA technique
without intravenous contrast. Angiographic images of the neck were
obtained using MRA technique without and with intravenous contrast.
Carotid stenosis measurements (when applicable) are obtained
utilizing NASCET criteria, using the distal internal carotid
diameter as the denominator.
CONTRAST:  7mL GADAVIST GADOBUTROL 1 MMOL/ML IV SOLN

[Series 5: 3d cow · axial · 0.5mm · 0.41mm/px · z∈[-85,+10]mm · 15 of 200 slices shown]
[im 1/200]
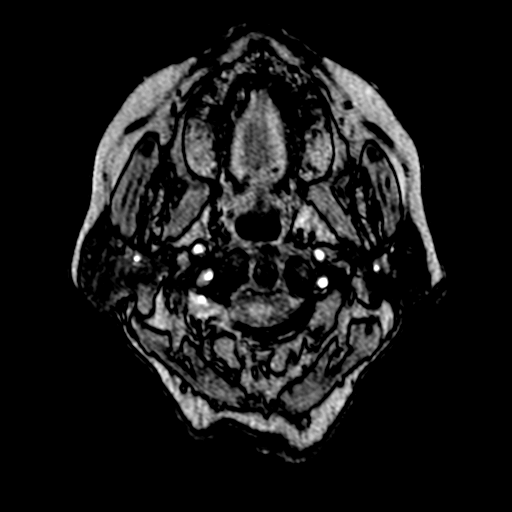
[im 5/200]
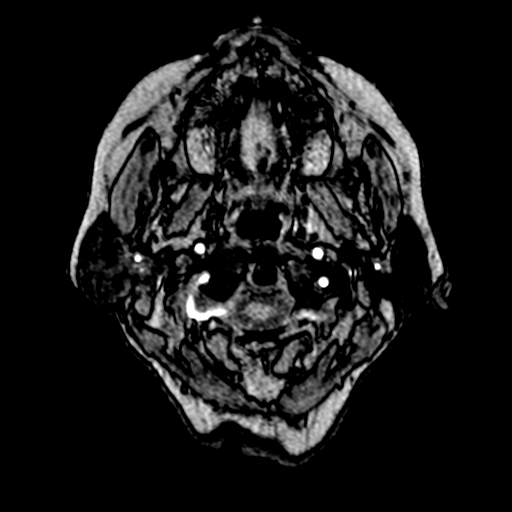
[im 9/200]
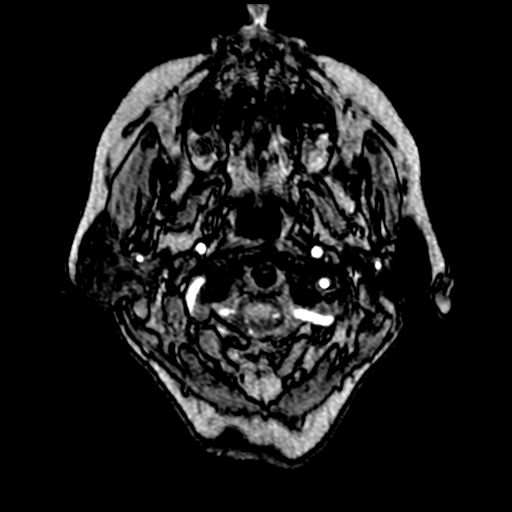
[im 13/200]
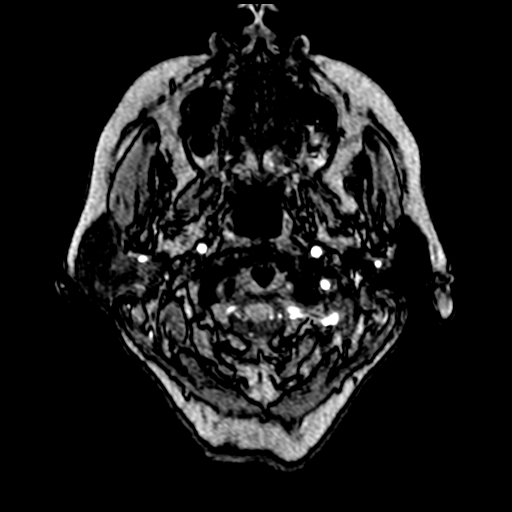
[im 17/200]
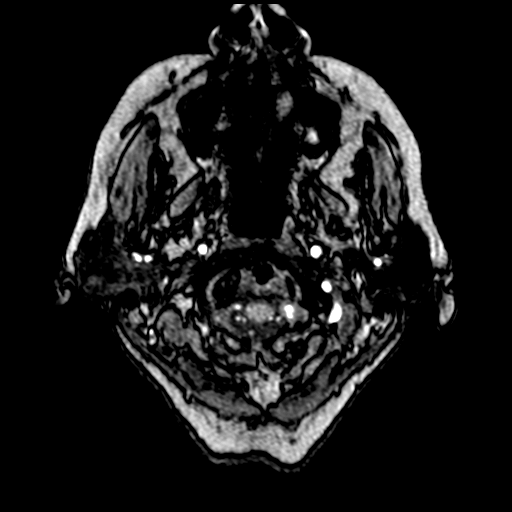
[im 34/200]
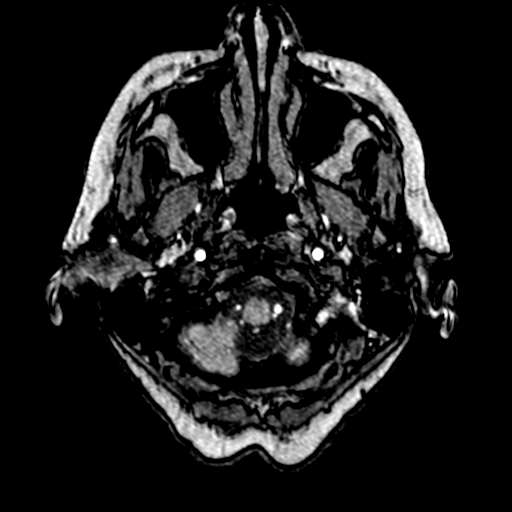
[im 39/200]
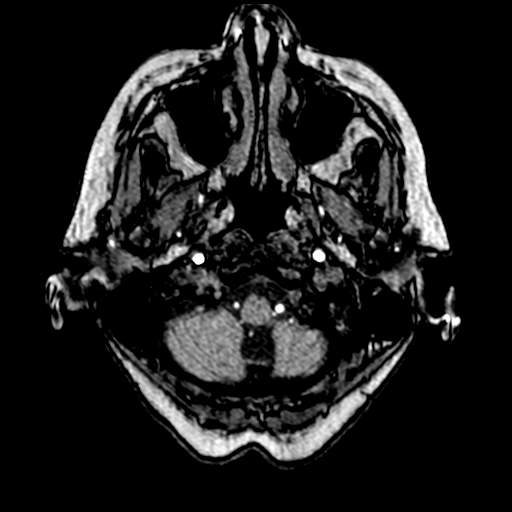
[im 64/200]
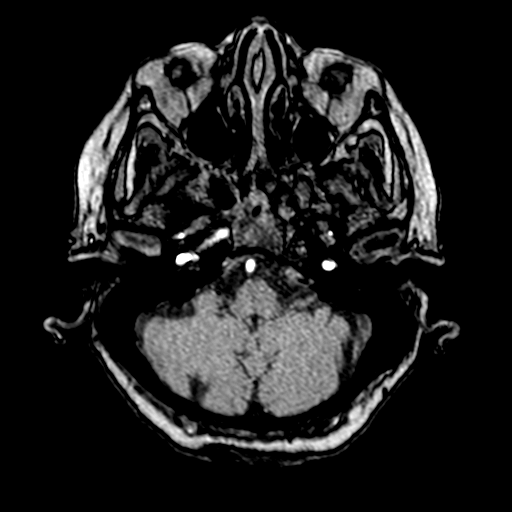
[im 89/200]
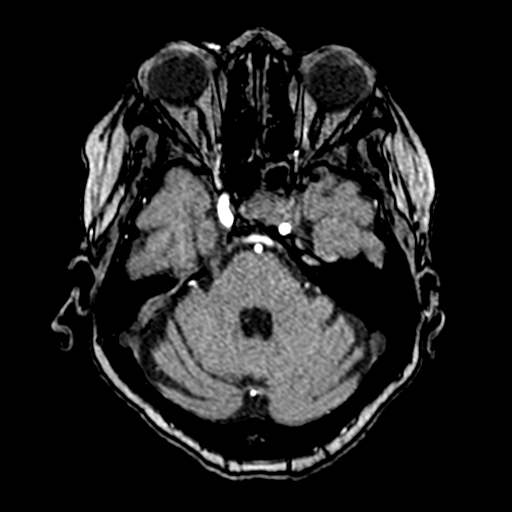
[im 102/200]
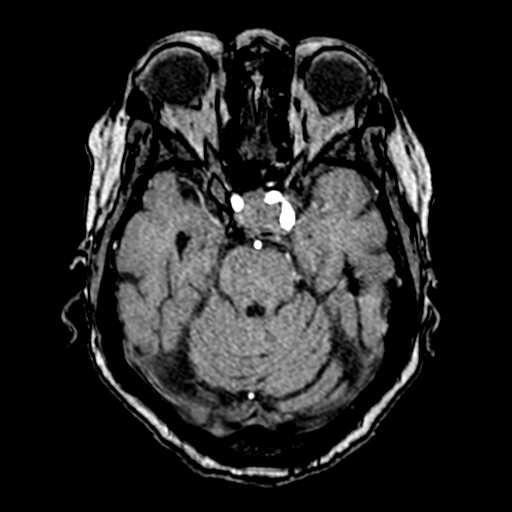
[im 115/200]
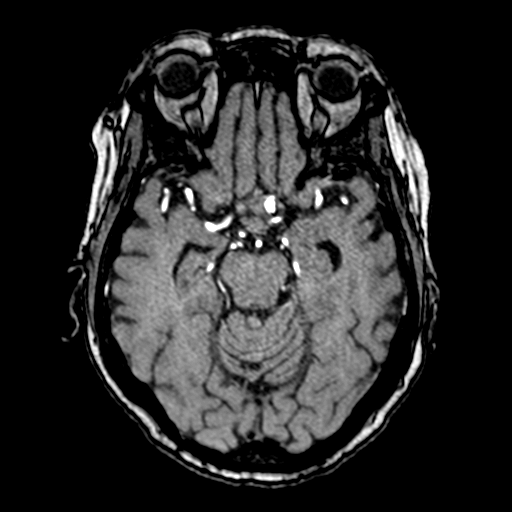
[im 140/200]
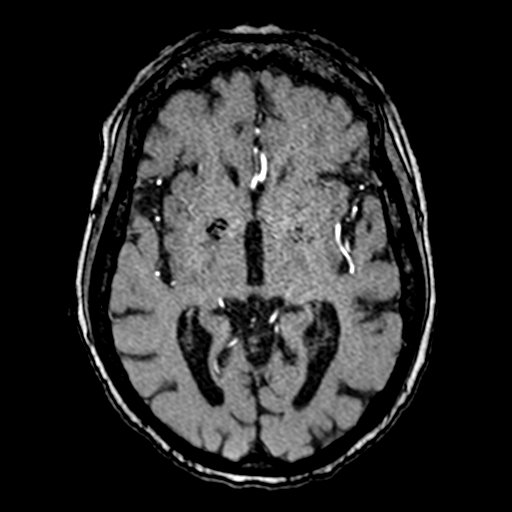
[im 166/200]
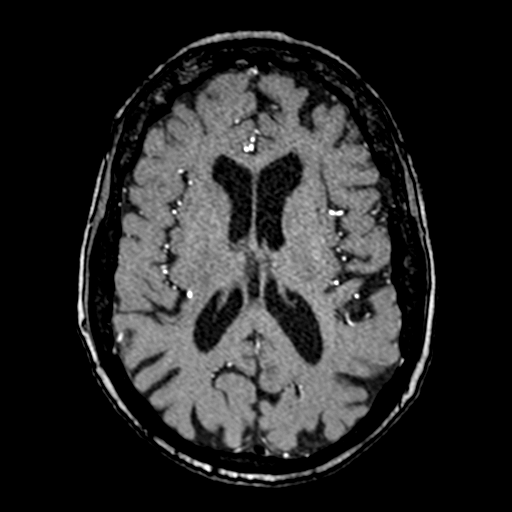
[im 170/200]
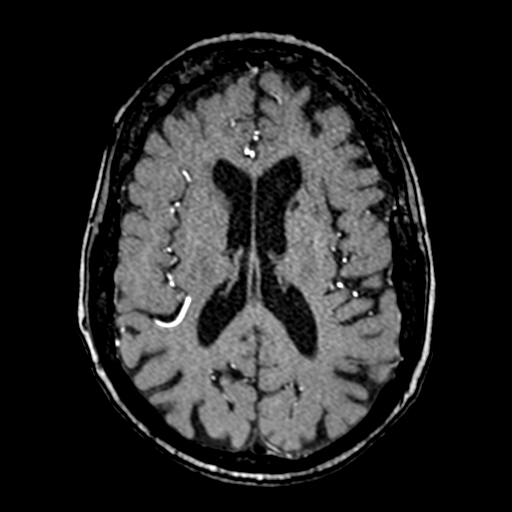
[im 191/200]
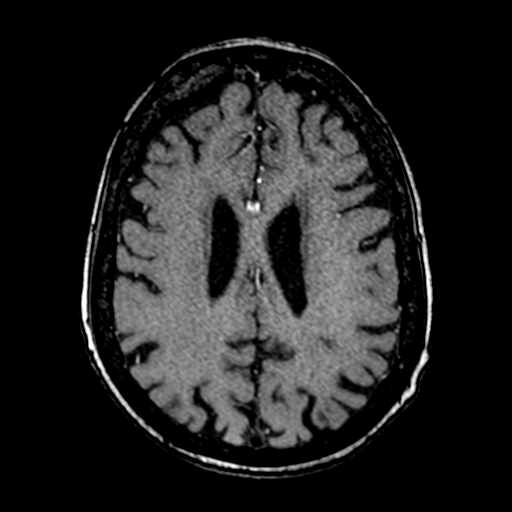

[15 of 48 positions shown; findings below may reference images not displayed]

FINDINGS: MRI HEAD FINDINGS

Brain: Generalized age-related cerebral atrophy. Patchy and
confluent T2/FLAIR hyperintensity within the periventricular deep
white matter both cerebral hemispheres most consistent with chronic
small vessel ischemic disease. Area of encephalomalacia and gliosis
involving the cortical and subcortical aspect of the left frontal
lobe consistent with a chronic ischemic infarct. Additional small
remote cortical/subcortical infarcts segment at the left parietal
lobe. Few small remote lacunar infarcts noted about the bilateral
basal ganglia and cerebellum. Additional small remote lacunar
infarct noted at the left frontal centrum semi ovale.

No abnormal foci of restricted diffusion to suggest acute or
subacute ischemia. Gray-white matter differentiation maintained. No
evidence for acute intracranial hemorrhage. Few small chronic micro
hemorrhages noted at the periventricular white matter of the
posterior corona radiata, likely small vessel related.

Previously noted sellar/suprasellar mass again seen, measuring 2.0 x
2.5 x 2.8 cm in greatest dimensions, most consistent with a
pituitary macro adenoma. Suprasellar extension to involve the
pituitary stalk. Tumor abuts the optic chiasm superiorly which is
slightly bowed superiorly and deviated to the right. Evidence for
extension into the left cavernous sinus with tumor nearly entirely
compass seen the cavernous left ICA. Probable inferior erosion
through the clivus with possible extension into the left sphenoid
sinus (series 22, image 9).

No other mass lesion, mass effect or midline shift. No other
abnormal enhancement. No hydrocephalus or extra-axial fluid
collection. Midline structures otherwise intact.

Vascular: Major intracranial vascular flow voids are maintained.

Skull and upper cervical spine: Craniocervical junction within
normal limits. Bone marrow signal intensity normal. No acute scalp
soft tissue abnormality. Small lipoma noted at the right frontal
scalp.

Sinuses/Orbits: Patient status post bilateral ocular lens
replacement. Globes and orbital soft tissues demonstrate no acute
finding. Scattered mucosal thickening noted throughout the paranasal
sinuses. Mastoid air cells are clear. Inner ear structures grossly
normal.

Other: None.

MRA HEAD FINDINGS

ANTERIOR CIRCULATION:

Visualized distal cervical segments of the internal carotid arteries
are widely patent with antegrade flow. Petrous, cavernous, and
supraclinoid ICAs widely patent without stenosis. Note made of the
pituitary macro adenoma partially encasing the
cavernous/supraclinoid left ICA without associated luminal
narrowing. Left A1 widely patent. Right A1 hypoplastic and/or
absent, accounting for the slightly diminutive right ICA is compared
to the left. Normal anterior communicating artery complex. Anterior
cerebral arteries patent to their distal aspects without stenosis.
No M1 stenosis or occlusion. Negative MCA bifurcations. Distal MCA
branches well perfused and symmetric.

POSTERIOR CIRCULATION:

Both V4 segments widely patent to the vertebrobasilar junction. Left
V4 dominant. Both PICA origins patent and normal. Basilar widely
patent to its distal aspect. Superior cerebral arteries patent
bilaterally. Left PCA supplied via the basilar. Predominant fetal
type origin of the right PCA. Both PCAs well perfused or distal
aspects.

No intracranial aneurysm.

MRA NECK FINDINGS

AORTIC ARCH: Visualized aortic arch of normal caliber. Bovine
branching pattern noted. No hemodynamically significant stenosis
seen about the origin of the great vessels.

RIGHT CAROTID SYSTEM: Right CCA widely patent from its origin to the
bifurcation. Short-segment atheromatous stenosis of up to 40% by
NASCET criteria noted at the origin of the right ICA. Right ICA
widely patent distally to the skull base without stenosis, evidence
for dissection or occlusion.

LEFT CAROTID SYSTEM: Left CCA widely patent from its origin to the
bifurcation without stenosis. No significant atheromatous narrowing
or irregularity about the left bifurcation. Left ICA widely patent
distally without stenosis, evidence for dissection or occlusion.

VERTEBRAL ARTERIES: Both vertebral arteries arise from the
subclavian arteries. No proximal subclavian artery stenosis. Left
vertebral artery dominant. Probable moderate approximate 50%
stenosis noted at the origin of the diminutive right vertebral
artery. Vertebral arteries otherwise patent within the neck without
stenosis, evidence for dissection or occlusion.

Enlarged multinodular goiter. Dominant nodule measuring up to
approximately 2 cm present at the upper pole of the right thyroid
lobe (series 17, image 26).
IMPRESSION: MRI HEAD IMPRESSION:

1. No acute intracranial abnormality.
2. Underlying age-related cerebral atrophy with mild chronic
microvascular ischemic disease. Superimposed chronic ischemic
infarcts involving the left frontal and parietal lobes, with
additional scattered remote lacunar infarcts about the left centrum
semi ovale, bilateral basal ganglia, and cerebellum.
3. 2.0 x 2.5 x 2.8 cm pituitary macro adenoma with evidence for
invasion into the left cavernous sinus, with probable inferior
erosion into the left sphenoid sinus.

MRA HEAD IMPRESSION:

Negative intracranial MRA for large vessel occlusion. No
hemodynamically significant or correctable stenosis identified.

MRA NECK IMPRESSION:

1. Short-segment 40% atheromatous stenosis at the origin of the
right ICA.
2. Otherwise wide patency of both carotid artery systems within the
neck.
3. Short-segment moderate approximate 50% stenosis at the origin of
the right vertebral artery. Vertebral arteries otherwise patent
within the neck. Left vertebral artery dominant.
4. Enlarged multinodular thyroid with dominant nodule measuring up
to approximately 2 cm at the upper right thyroid lobe. Further
evaluation with dedicated thyroid ultrasound recommended for further
evaluation. This could be performed on a nonemergent outpatient
basis. (Ref: [HOSPITAL]. [DATE]): 143-50).

## 2020-12-13 MED ORDER — ASPIRIN 81 MG PO CHEW
324.0000 mg | CHEWABLE_TABLET | Freq: Once | ORAL | Status: AC
  Administered 2020-12-13: 17:00:00 324 mg via ORAL

## 2020-12-13 MED ORDER — CLOPIDOGREL BISULFATE 75 MG PO TABS
75.0000 mg | ORAL_TABLET | Freq: Every day | ORAL | Status: DC
  Administered 2020-12-14 – 2020-12-15 (×2): 75 mg via ORAL
  Filled 2020-12-13 (×2): qty 1

## 2020-12-13 MED ORDER — ACETAMINOPHEN 650 MG RE SUPP: 650.0000 mg | RECTAL | Status: DC | PRN

## 2020-12-13 MED ORDER — ASPIRIN 81 MG PO CHEW
81.0000 mg | CHEWABLE_TABLET | Freq: Every day | ORAL | Status: DC
  Administered 2020-12-14: 09:00:00 81 mg via ORAL
  Filled 2020-12-13 (×2): qty 1

## 2020-12-13 MED ORDER — SODIUM CHLORIDE 0.9% FLUSH
3.0000 mL | Freq: Once | INTRAVENOUS | Status: AC
  Administered 2020-12-13: 17:00:00 3 mL via INTRAVENOUS

## 2020-12-13 MED ORDER — CLOPIDOGREL BISULFATE 300 MG PO TABS
300.0000 mg | ORAL_TABLET | Freq: Once | ORAL | Status: AC
  Administered 2020-12-13: 17:00:00 300 mg via ORAL

## 2020-12-13 MED ORDER — SODIUM CHLORIDE 0.9 % IV SOLN: INTRAVENOUS | Status: AC

## 2020-12-13 MED ORDER — INSULIN ASPART 100 UNIT/ML ~~LOC~~ SOLN
0.0000 [IU] | Freq: Three times a day (TID) | SUBCUTANEOUS | Status: DC
  Administered 2020-12-14: 19:00:00 3 [IU] via SUBCUTANEOUS
  Administered 2020-12-14: 09:00:00 1 [IU] via SUBCUTANEOUS
  Administered 2020-12-14 – 2020-12-15 (×3): 2 [IU] via SUBCUTANEOUS

## 2020-12-13 MED ORDER — INSULIN ASPART 100 UNIT/ML ~~LOC~~ SOLN
0.0000 [IU] | Freq: Every day | SUBCUTANEOUS | Status: DC
  Administered 2020-12-14: 23:00:00 4 [IU] via SUBCUTANEOUS

## 2020-12-13 MED ORDER — ACETAMINOPHEN 160 MG/5ML PO SOLN: 650.0000 mg | ORAL | Status: DC | PRN

## 2020-12-13 MED ORDER — GADOBUTROL 1 MMOL/ML IV SOLN
7.0000 mL | Freq: Once | INTRAVENOUS | Status: AC | PRN
  Administered 2020-12-13: 7 mL via INTRAVENOUS

## 2020-12-13 MED ORDER — ENOXAPARIN SODIUM 40 MG/0.4ML ~~LOC~~ SOLN
40.0000 mg | SUBCUTANEOUS | Status: DC
  Administered 2020-12-13 – 2020-12-14 (×2): 40 mg via SUBCUTANEOUS
  Filled 2020-12-13 (×2): qty 0.4

## 2020-12-13 MED ORDER — STROKE: EARLY STAGES OF RECOVERY BOOK
Freq: Once | Status: AC
  Filled 2020-12-13: qty 1

## 2020-12-13 MED ORDER — ACETAMINOPHEN 325 MG PO TABS: 650.0000 mg | ORAL_TABLET | ORAL | Status: DC | PRN

## 2020-12-13 MED ORDER — SENNOSIDES-DOCUSATE SODIUM 8.6-50 MG PO TABS: 1.0000 | ORAL_TABLET | Freq: Every evening | ORAL | Status: DC | PRN

## 2020-12-13 NOTE — Consult Note (Addendum)
Neurology Consult H&P  CC: right sided weakness and neglect  History is obtained from: EMS and patient  HPI: Christine Newton is a 86 y.o. femaleleft handed PMHx reviewed below ambulated to the table to eat and on sitting had near syncopal episode and EMS noted right sided weakness and right sided neglect. On arrival to ED, symptoms had almost completely resolved.  LKW: 8937 tpa given?: No, too mild IR Thrombectomy? No, exam not suggestive. Modified Rankin Scale: 0-Completely asymptomatic and back to baseline post- stroke NIHSS: 1  ROS: A complete ROS was performed and is negative except as noted in the HPI.   Past Medical History:  Diagnosis Date   Diabetes ([place])    Hypertension    Nonrheumatic aortic (valve) stenosis 09/21/2020   No family history on file.  Social History:  reports that she has never smoked. She has never used smokeless tobacco. She reports that she does not drink alcohol and does not use drugs.   Prior to Admission medications   Medication Sig Start Date End Date Taking? Authorizing Provider  Accu-Chek Softclix Lancets lancets 1 each by Other route daily. Use as instructed 09/03/20   Lillard Anes, MD  amLODipine (NORVASC) 5 MG tablet Take 1 tablet (5 mg total) by mouth daily. 02/03/20   Lillard Anes, MD  atorvastatin (LIPITOR) 10 MG tablet TAKE 1 TABLET BY MOUTH ONCE DAILY 12/10/20   Lillard Anes, MD  Blood Glucose Monitoring Suppl (ACCU-CHEK GUIDE) w/Device KIT 1 each by Does not apply route 2 (two) times daily as needed. 06/18/20   Lillard Anes, MD  erythromycin ophthalmic ointment Place 1 application into both eyes at bedtime. 06/09/20   Lillard Anes, MD  glucose blood test strip 1 each by Other route 2 (two) times daily as needed. Use as instructed 06/18/20   Lillard Anes, MD  lisinopril (ZESTRIL) 20 MG tablet TAKE 1 TABLET BY MOUTH EVERY DAY 11/18/20   Lillard Anes, MD  metFORMIN  (GLUCOPHAGE) 500 MG tablet  07/07/19   [provider]  sulfamethoxazole-trimethoprim (BACTRIM) 400-80 MG tablet Take 1 tablet by mouth 2 (two) times daily. 09/21/20   Maryruth Hancock, MD    Exam: Current vital signs: There were no vitals taken for this visit.  Physical Exam  Constitutional: Appears well-developed and well-nourished.  Psych: Affect appropriate to situation Eyes: No scleral injection HENT: No OP obstrucion Head: Normocephalic.  Cardiovascular: Normal rate and regular rhythm.  Respiratory: Effort normal and breath sounds normal to anterior ascultation GI: Soft.  No distension. There is no tenderness.  Skin: WDI  Neuro: Mental Status: Patient is awake, alert, oriented to person, place, month, year, and situation. Patient is able to give a clear and coherent history. No signs of aphasia or neglect. Cranial Nerves: II: Visual Fields are full. Pupils are equal, round, and reactive to light. III,IV, VI: EOMI without ptosis or diploplia.  V: Facial sensation is symmetric to temperature VII: Facial movement is symmetric.  VIII: hearing is intact to voice X: Uvula elevates symmetrically XI: Shoulder shrug is symmetric. XII: tongue is midline without atrophy or fasciculations.  Motor: Tone is normal. Bulk is normal. 5/5 strength was present in all four extremities. Sensory: Sensation is decreased to cold sensation in left arm and leg.  Deep Tendon Reflexes: 2+ and symmetric in the biceps and patellae. Plantars: Toes are downgoing bilaterally. Cerebellar: FNF and HKS are intact bilaterally.  I have reviewed labs in epic and the pertinent  results are:   Ref. Range 12/13/2020 16:41  Sodium Latest Ref Range: 135 - 145 mmol/L 144  Potassium Latest Ref Range: 3.5 - 5.1 mmol/L 4.0  Chloride Latest Ref Range: 98 - 111 mmol/L 110  Glucose Latest Ref Range: 70 - 99 mg/dL 119 (H)  BUN Latest Ref Range: 8 - 23 mg/dL 50 (H)  Creatinine Latest Ref Range: 0.44 - 1.00  mg/dL 0.80   I have reviewed the images obtained: NCT head showed no acute ischemic finding. Chronic left posterior frontal vertex cortical infarction. ASPECTS is 10.  Sellar mass with suprasellar extension.     Assessment: Christine Newton is a 86 y.o. female left handed PMHx DM, HTN chronic left frontal stroke with right sided weakness and neglect of her right side. Her transient neurologic  Deficits spontaneously resolved on arrival. Symptoms and handedness suggest left MCA vessel which may spontaneously recanalized. Right sided symptoms resolved however, patient had decreased cold sensation on left. The patient has vascular risk factors and will need further stroke evaluation.  1 dose aspirin and 1 dose clopidogrel now.  The patient has large mass in pituitary with suprasellar extension which may need further evaluation.  Plan: - MRI brain without contrast. - Recommend vascular imaging with MRA head and neck. - Recommend TTE. - Recommend labs: HbA1c, lipid panel, TSH. - Recommend Statin if LDL > 70 - Aspirin 32m daily. - Clopidogrel 760mdaily for 3 weeks. - Permissive hypertension first 24 h < 220/110.  - Telemetry monitoring for arrhythmia. - Recommend bedside Swallow screen. - Recommend Stroke education. - Recommend PT/OT/SLP consult. - Consider endocrine/neurosurgery evaluation of pituitary mass.  This patient is critically ill and at significant risk of neurological worsening, death and care requires constant monitoring of vital signs, hemodynamics,respiratory and cardiac monitoring, neurological assessment, discussion with family, other specialists and medical decision making of high complexity. I spent 73 minutes of neurocritical care time  in the care of  this patient. This was time spent independent of any time provided by nurse practitioner or PA.  Electronically signed by: Dr. HuLynnae Sandhoffager: [id number]/19/2021, 4:53 PM

## 2020-12-13 NOTE — ED Notes (Signed)
Pt's sister would like to have a callback with an update at [phone], Jinny Sanders

## 2020-12-13 NOTE — H&P (Signed)
History and Physical  Patient Name: Christine Newton     BPZ:[id number]    DOB: 09-20-1933    DOA: 12/13/2020 PCP: Lillard Anes, MD  Patient coming from: Home  Chief Complaint: Altered mental status; concern for stroke      HPI: Christine Newton is a 86 y.o. female with hx of non-insulin-dependent type 2 diabetes, hypertension, dementia, aortic valve repair over with bioprosthesis in 2014 (only on baby aspirin daily), who presented to the hospital on 12/13/2020 via EMS secondary to altered mental status.  Patient is a relatively poor historian, thus a complete and thorough HPI and review of systems difficult to obtain from patient herself.  However patient's daughter bedside able to provide some history.  Apparently patient was eating lunch when the daughter noticed she was slumped over and altered, not responsive, but no loss of consciousness.  Patient herself says she thinks she "blacked out".  She then helped her mother to the ground and called EMS for help.  They deny any similar symptoms in the past.  She has been in her normal state of health over the past week.  No recent changes in medication.  No history of being on anticoagulation but again is on daily aspirin daily.  Of note she is scheduled to have a echocardiogram in the near future due to chronic dyspnea.  Her blood pressure has remained controlled at home as they check it regularly.  Blood sugar has been controlled as well.  ED course: -On presentation, code stroke called. -Relevant labs on initial presentation: Sodium 144, potassium 4.1, chloride 109, bicarb 21, glucose 124, BUN 39, creatinine 0.98, WBC 15.7, hemoglobin 13.6.  UA: Trace leukocytes, nitrate negative, bacteria many, WBC 0-5, Covid negative. -CT head without contrast showed no acute findings.  Chronic small vessel ischemic changes, old infarct, and sellar mass with suprasellar extension most likely representing a pituitary adenoma. -Evaluated by  neurology in the ED, recommendations as below -Symptoms resolved at time of examination, back to her baseline    ROS: Review of Systems  Constitutional: Negative for chills and fever.  HENT: Negative for hearing loss and sore throat.   Respiratory: Negative for cough and shortness of breath.   Cardiovascular: Negative for chest pain and palpitations.  Gastrointestinal: Negative for abdominal pain, nausea and vomiting.  Genitourinary: Negative for dysuria, hematuria and urgency.  Neurological: Positive for speech change and weakness. Negative for seizures and headaches.  Psychiatric/Behavioral: Positive for memory loss.          Past Medical History:  Diagnosis Date  . Diabetes ([place])   . Hypertension   . Nonrheumatic aortic (valve) stenosis 09/21/2020    Past Surgical History:  Procedure Laterality Date  . CARDIAC VALVE REPLACEMENT    . CHOLECYSTECTOMY      Social History: Patient lives at home with daughter.  The patient walks with a walker.  Non-smoker.  Allergies  Allergen Reactions  . Cephalosporins Nausea Only and Other (See Comments)    Other Reaction: Allergy  . Aspirin Nausea Only    Family history: *family history is not on file.  Prior to Admission medications   Medication Sig Start Date End Date Taking? Authorizing Provider  Accu-Chek Softclix Lancets lancets 1 each by Other route daily. Use as instructed 09/03/20   Lillard Anes, MD  amLODipine (NORVASC) 5 MG tablet Take 1 tablet (5 mg total) by mouth daily. 02/03/20   Lillard Anes, MD  atorvastatin (LIPITOR) 10 MG tablet TAKE 1  TABLET BY MOUTH ONCE DAILY 12/10/20   Lillard Anes, MD  Blood Glucose Monitoring Suppl (ACCU-CHEK GUIDE) w/Device KIT 1 each by Does not apply route 2 (two) times daily as needed. 06/18/20   Lillard Anes, MD  erythromycin ophthalmic ointment Place 1 application into both eyes at bedtime. 06/09/20   Lillard Anes, MD  glucose blood test strip  1 each by Other route 2 (two) times daily as needed. Use as instructed 06/18/20   Lillard Anes, MD  lisinopril (ZESTRIL) 20 MG tablet TAKE 1 TABLET BY MOUTH EVERY DAY 11/18/20   Lillard Anes, MD  metFORMIN (GLUCOPHAGE) 500 MG tablet  07/07/19   [provider]  sulfamethoxazole-trimethoprim (BACTRIM) 400-80 MG tablet Take 1 tablet by mouth 2 (two) times daily. 09/21/20   Maryruth Hancock, MD       Physical Exam: BP 122/70   Pulse 86   Temp 97.6 F (36.4 C) (Oral)   Resp 18   Ht _0  (1.676 m)   Wt 63.7 kg   SpO2 100%   BMI 22.67 kg/m  General appearance: Well-developed, adult female, alert and in no acute distress Eyes: Anicteric, conjunctiva pink, lids and lashes normal. PERRL.    ENT: No nasal deformity, discharge, epistaxis.  Hearing within normal limits. OP moist without lesions.   Neck: No neck masses.  Trachea midline.  No thyromegaly/tenderness. Lymph: No cervical or supraclavicular lymphadenopathy. Skin: Warm and dry.  No jaundice.  No suspicious rashes or lesions. Cardiac: RRR, nl S1-S2, no murmurs appreciated. .  JVP not visible.  Trace LE edema.  Radial and pedal pulses 2+ and symmetric. Respiratory: Normal respiratory rate and rhythm.  CTAB without rales or wheezes. Abdomen: Abdomen soft.  Nontender, bowel sounds present. No ascites, distension, hepatosplenomegaly.   MSK: No deformities or effusions of the large joints of the upper or lower extremities bilaterally.  No cyanosis or clubbing. Neuro: Cranial nerves 2 through 12 grossly intact.  Sensation intact to light touch. Speech is fluent.  Muscle strength 5/5 upper and lower.    Psych: Sensorium intact and responding to questions, attention normal.  Behavior appropriate.  Affect appropriate.  Memory is slightly slow     Labs on Admission:  I have personally reviewed following labs and imaging studies: CBC: Recent Labs  Lab 12/13/20 1635 12/13/20 1641  WBC 15.7*  --   NEUTROABS 9.0*  --    HGB 13.6 14.6  HCT 44.5 43.0  MCV 97.4  --   PLT 206  --    Basic Metabolic Panel: Recent Labs  Lab 12/13/20 1635 12/13/20 1641  NA 144 144  K 4.1 4.0  CL 109 110  CO2 21*  --   GLUCOSE 124* 119*  BUN 39* 50*  CREATININE 0.98 0.80  CALCIUM 9.1  --    GFR: Estimated Creatinine Clearance: 48.1 mL/min (by C-G formula based on SCr of 0.8 mg/dL).  Liver Function Tests: Recent Labs  Lab 12/13/20 1635  AST 17  ALT 14  ALKPHOS 53  BILITOT 0.4  PROT 6.4*  ALBUMIN 3.0*   No results for input(s): LIPASE, AMYLASE in the last 168 hours. No results for input(s): AMMONIA in the last 168 hours. Coagulation Profile: Recent Labs  Lab 12/13/20 1635  INR 1.0   Cardiac Enzymes: No results for input(s): CKTOTAL, CKMB, CKMBINDEX, TROPONINI in the last 168 hours. BNP (last 3 results) Recent Labs    11/18/20 1215  PROBNP 268   HbA1C: No results  for input(s): HGBA1C in the last 72 hours. CBG: Recent Labs  Lab 12/13/20 1634  GLUCAP 105*   Lipid Profile: No results for input(s): CHOL, HDL, LDLCALC, TRIG, CHOLHDL, LDLDIRECT in the last 72 hours. Thyroid Function Tests: No results for input(s): TSH, T4TOTAL, FREET4, T3FREE, THYROIDAB in the last 72 hours. Anemia Panel: No results for input(s): VITAMINB12, FOLATE, FERRITIN, TIBC, IRON, RETICCTPCT in the last 72 hours.    Recent Results (from the past 240 hour(s))  Resp Panel by RT-PCR (Flu A&B, Covid) Nasopharyngeal Swab     Status: None   Collection Time: 12/13/20  5:51 PM   Specimen: Nasopharyngeal Swab; Nasopharyngeal(NP) swabs in vial transport medium  Result Value Ref Range Status   SARS Coronavirus 2 by RT PCR NEGATIVE NEGATIVE Final    Comment: (NOTE) SARS-CoV-2 target nucleic acids are NOT DETECTED.  The SARS-CoV-2 RNA is generally detectable in upper respiratory specimens during the acute phase of infection. The lowest concentration of SARS-CoV-2 viral copies this assay can detect is 138 copies/mL. A  negative result does not preclude SARS-Cov-2 infection and should not be used as the sole basis for treatment or other patient management decisions. A negative result may occur with  improper specimen collection/handling, submission of specimen other than nasopharyngeal swab, presence of viral mutation(s) within the areas targeted by this assay, and inadequate number of viral copies(<138 copies/mL). A negative result must be combined with clinical observations, patient history, and epidemiological information. The expected result is Negative.  Fact Sheet for Patients:  EntrepreneurPulse.com.au  Fact Sheet for Healthcare Providers:  IncredibleEmployment.be  This test is no t yet approved or cleared by the [place] FDA and  has been authorized for detection and/or diagnosis of SARS-CoV-2 by FDA under an Emergency Use Authorization (EUA). This EUA will remain  in effect (meaning this test can be used) for the duration of the COVID-19 declaration under Section 564(b)(1) of the Act, 21 U.S.C.section 360bbb-3(b)(1), unless the authorization is terminated  or revoked sooner.       Influenza A by PCR NEGATIVE NEGATIVE Final   Influenza B by PCR NEGATIVE NEGATIVE Final    Comment: (NOTE) The Xpert Xpress SARS-CoV-2/FLU/RSV plus assay is intended as an aid in the diagnosis of influenza from Nasopharyngeal swab specimens and should not be used as a sole basis for treatment. Nasal washings and aspirates are unacceptable for Xpert Xpress SARS-CoV-2/FLU/RSV testing.  Fact Sheet for Patients: EntrepreneurPulse.com.au  Fact Sheet for Healthcare Providers: IncredibleEmployment.be  This test is not yet approved or cleared by the [place] FDA and has been authorized for detection and/or diagnosis of SARS-CoV-2 by FDA under an Emergency Use Authorization (EUA). This EUA will remain in effect (meaning this test can  be used) for the duration of the COVID-19 declaration under Section 564(b)(1) of the Act, 21 U.S.C. section 360bbb-3(b)(1), unless the authorization is terminated or revoked.  Performed at [facility], [place] [address], [place], [place] [address]            Radiological Exams on Admission: Personally reviewed imaging which demonstrated no acute intracranial process but did show sellar mass: DG Chest Portable 1 View  Result Date: 12/13/2020 CLINICAL DATA:  Code stroke.  Syncopal episode. EXAM: PORTABLE CHEST 1 VIEW COMPARISON:  None. FINDINGS: Patient is rotated to the right. Monitoring leads overlie the patient. Cardiac contours normal in size. Low lung volumes. Bibasilar heterogeneous opacities. Nodular opacity projects over the right upper hemithorax. No pleural effusion or pneumothorax. Thoracic spine degenerative  changes. IMPRESSION: Nodular opacity projecting over the right upper hemithorax likely represents the first rib given the patient rotation. Recommend dedicated evaluation with repeat PA and lateral chest radiograph to exclude the possibility of pulmonary nodule. Heterogeneous opacities left greater than right lung bases favored to represent atelectasis. Infection not excluded. Electronically Signed   By: Lovey Newcomer M.D.   On: 12/13/2020 17:41   CT HEAD CODE STROKE WO CONTRAST  Result Date: 12/13/2020 CLINICAL DATA:  Code stroke. Right sided deficits. Acute stroke presentation. EXAM: CT HEAD WITHOUT CONTRAST TECHNIQUE: Contiguous axial images were obtained from the base of the skull through the vertex without intravenous contrast. COMPARISON:  None. FINDINGS: Brain: Age related atrophy. No focal abnormality affects the brainstem or cerebellum. Cerebral hemispheres show chronic small-vessel ischemic changes of the white matter. There is an old left posterior frontal vertex cortical infarction. No sign of acute infarction, intra-axial mass lesion, hemorrhage, hydrocephalus or  extra-axial collection. The patient has a sellar mass with suprasellar extension most likely to represent a pituitary adenoma. Vascular: There is atherosclerotic calcification of the major vessels at the base of the brain. Skull: Negative Sinuses/Orbits: Clear except for chronic inflammatory changes of the left division of the sphenoid sinus. Orbits negative. Other: None ASPECTS ([place] Stroke Program Early CT Score) - Ganglionic level infarction (caudate, lentiform nuclei, internal capsule, insula, M1-M3 cortex): 7 - Supraganglionic infarction (M4-M6 cortex): 3 Total score (0-10 with 10 being normal): 10 IMPRESSION: 1. No acute finding by CT. Atrophy and chronic small-vessel ischemic changes. 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 2. Sellar mass with suprasellar extension most likely to represent a pituitary adenoma. 3. ASPECTS is 10. 4. These results were communicated to Dr. Theda Sers At 4:44 pmon 12/19/2021by text page via the [facility] messaging system. Electronically Signed   By: Nelson Chimes M.D.   On: 12/13/2020 16:45      Assessment/Plan   1.  Concern for TIA; -Patient presents from home with acute mental status changes, and right-sided weakness which has currently resolved; concern for TIA -CT head on admission reviewed, no acute processes but findings consistent with chronic small vessel disease and previous/old infarct -Neurology evaluated patient in the ED; will consult formally -Daily aspirin and Plavix -Increase home Lipitor 20 mg daily -Neurochecks -MRI brain ordered -MRA of head and neck ordered -Echocardiogram ordered -Telemetry to monitor for arrhythmia -Gentle IV hydration -Therapies consulted  Neurology recommendations as below: - MRI brain without contrast. -Recommend vascular imagingwith MRA head and neck. -RecommendTTE. -Recommend labs: HbA1c, lipid panel, TSH. -RecommendStatinif LDL > 70 - Aspirin 78m daily. - Clopidogrel 770mdaily for 3  weeks. - Permissive hypertension first 24 h < 220/110.  - Telemetry monitoring for arrhythmia. -Recommend bedsideSwallow screen. -RecommendStroke education. -RecommendPT/OT/SLP consult. - Consider endocrine/neurosurgery evaluation of pituitary mass.  2.  Pituitary mass -CT head without contrast reviewed, sellar mass likely representing pituitary adenoma -Endocrinology consult placed  3.  Essential hypertension -Hold home blood pressure medications, however permissive hypertension in the setting of possible TIA -Continue close monitoring of BP   4.  Non-insulin-dependent type 2 diabetes -Hold home metformin -Hemoglobin A1c ordered -Glucose checks, sliding scale AC & HS   5.  Pyuria -Urinalysis on admission: Leukocytes trace, nitrite negative, bacteria many, WBC 0-5 -Given no urinary symptoms and UA not grossly consistent with UTI, we will not start antibiotics at this time and monitor closely      DVT prophylaxis: Lovenox Code Status: Full Family Communication: Discussed case with daughter bedside Disposition Plan:  Anticipate 2 to 3 days Consults called: Neurology contacted by ED provider and seen bedside, but official consult not called in Admission status: Inpatient with telemetry    Medical decision making: Patient seen at 9:17 PM on 12/13/2020.  The patient was discussed with Dr.Trifan .  What exists of the patient's chart was reviewed in depth and summarized above.  Clinical condition: Stable but in need of close monitoring and further testing.        Doran Heater [facility] Please page though [place] or Epic secure chat:  For password, contact charge nurse

## 2020-12-13 NOTE — ED Notes (Signed)
Urine sent to lab WITH a culture.  

## 2020-12-13 NOTE — ED Triage Notes (Signed)
Pt BIB [facility] from Home as a Code Stroke.  Per Daughter, pt ambulated to the table to eat and upon sitting, pt had a near syncopal episode.  Daughter called EMS and upon EMS arrival, EMS noted R. Sided Weakness and R. Sided Neglect with LSN: as 1545.   Pt A&Ox4. GCS 15.   All VSS Stable with EMS: 136/64 BP 80 HR, Sinus
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
## 2020-12-14 ENCOUNTER — Inpatient Hospital Stay (HOSPITAL_COMMUNITY): Payer: Medicare PPO

## 2020-12-14 ENCOUNTER — Inpatient Hospital Stay (HOSPITAL_BASED_OUTPATIENT_CLINIC_OR_DEPARTMENT_OTHER): Payer: Medicare PPO

## 2020-12-14 DIAGNOSIS — E041 Nontoxic single thyroid nodule: Secondary | ICD-10-CM | POA: Diagnosis not present

## 2020-12-14 DIAGNOSIS — I350 Nonrheumatic aortic (valve) stenosis: Secondary | ICD-10-CM | POA: Diagnosis not present

## 2020-12-14 DIAGNOSIS — G459 Transient cerebral ischemic attack, unspecified: Secondary | ICD-10-CM | POA: Diagnosis not present

## 2020-12-14 DIAGNOSIS — R55 Syncope and collapse: Secondary | ICD-10-CM | POA: Diagnosis not present

## 2020-12-14 DIAGNOSIS — E236 Other disorders of pituitary gland: Secondary | ICD-10-CM | POA: Diagnosis not present

## 2020-12-14 DIAGNOSIS — I6782 Cerebral ischemia: Secondary | ICD-10-CM | POA: Diagnosis not present

## 2020-12-14 DIAGNOSIS — E119 Type 2 diabetes mellitus without complications: Secondary | ICD-10-CM | POA: Diagnosis not present

## 2020-12-14 DIAGNOSIS — R6339 Other feeding difficulties: Secondary | ICD-10-CM | POA: Diagnosis not present

## 2020-12-14 DIAGNOSIS — R8281 Pyuria: Secondary | ICD-10-CM | POA: Diagnosis not present

## 2020-12-14 DIAGNOSIS — I10 Essential (primary) hypertension: Secondary | ICD-10-CM | POA: Diagnosis not present

## 2020-12-14 DIAGNOSIS — Z20822 Contact with and (suspected) exposure to covid-19: Secondary | ICD-10-CM | POA: Diagnosis not present

## 2020-12-14 LAB — CBC WITH DIFFERENTIAL/PLATELET
Abs Immature Granulocytes: 0.68 10*3/uL — ABNORMAL HIGH (ref 0.00–0.07)
Basophils Absolute: 0.1 10*3/uL (ref 0.0–0.1)
Basophils Relative: 1 %
Eosinophils Absolute: 0.1 10*3/uL (ref 0.0–0.5)
Eosinophils Relative: 0 %
HCT: 44.4 % (ref 36.0–46.0)
Hemoglobin: 13.8 g/dL (ref 12.0–15.0)
Immature Granulocytes: 5 %
Lymphocytes Relative: 11 %
Lymphs Abs: 1.5 10*3/uL (ref 0.7–4.0)
MCH: 29.9 pg (ref 26.0–34.0)
MCHC: 31.1 g/dL (ref 30.0–36.0)
MCV: 96.1 fL (ref 80.0–100.0)
Monocytes Absolute: 0.5 10*3/uL (ref 0.1–1.0)
Monocytes Relative: 3 %
Neutro Abs: 10.7 10*3/uL — ABNORMAL HIGH (ref 1.7–7.7)
Neutrophils Relative %: 80 %
Platelets: 195 10*3/uL (ref 150–400)
RBC: 4.62 MIL/uL (ref 3.87–5.11)
RDW: 15.4 % (ref 11.5–15.5)
WBC: 13.6 10*3/uL — ABNORMAL HIGH (ref 4.0–10.5)
nRBC: 0 % (ref 0.0–0.2)

## 2020-12-14 LAB — CBG MONITORING, ED
Glucose-Capillary: 137 mg/dL — ABNORMAL HIGH (ref 70–99)
Glucose-Capillary: 185 mg/dL — ABNORMAL HIGH (ref 70–99)

## 2020-12-14 LAB — LIPID PANEL
Cholesterol: 186 mg/dL (ref 0–200)
HDL: 45 mg/dL (ref >40)
LDL Cholesterol: 127 mg/dL — ABNORMAL HIGH (ref 0–99)
Total CHOL/HDL Ratio: 4.1 RATIO
Triglycerides: 68 mg/dL (ref <150)
VLDL: 14 mg/dL (ref 0–40)

## 2020-12-14 LAB — ECHOCARDIOGRAM COMPLETE
AR max vel: 1.04 cm2
AV Area VTI: 1.09 cm2
AV Area mean vel: 0.99 cm2
AV Mean grad: 19 mmHg
AV Peak grad: 31.8 mmHg
Ao pk vel: 2.82 m/s
Area-P 1/2: 3.37 cm2
Height: 66 in
S' Lateral: 2.4 cm
Weight: 2246.93 oz

## 2020-12-14 LAB — GLUCOSE, CAPILLARY
Glucose-Capillary: 241 mg/dL — ABNORMAL HIGH (ref 70–99)
Glucose-Capillary: 315 mg/dL — ABNORMAL HIGH (ref 70–99)

## 2020-12-14 LAB — BASIC METABOLIC PANEL
Anion gap: 12 (ref 5–15)
BUN: 31 mg/dL — ABNORMAL HIGH (ref 8–23)
CO2: 22 mmol/L (ref 22–32)
Calcium: 8.6 mg/dL — ABNORMAL LOW (ref 8.9–10.3)
Chloride: 113 mmol/L — ABNORMAL HIGH (ref 98–111)
Creatinine, Ser: 0.86 mg/dL (ref 0.44–1.00)
GFR, Estimated: >60 mL/min (ref >60)
Glucose, Bld: 152 mg/dL — ABNORMAL HIGH (ref 70–99)
Potassium: 3.9 mmol/L (ref 3.5–5.1)
Sodium: 147 mmol/L — ABNORMAL HIGH (ref 135–145)

## 2020-12-14 LAB — TSH: TSH: 0.564 u[IU]/mL (ref 0.350–4.500)

## 2020-12-14 LAB — HEMOGLOBIN A1C
Hgb A1c MFr Bld: 6.8 % — ABNORMAL HIGH (ref 4.8–5.6)
Mean Plasma Glucose: 148.46 mg/dL

## 2020-12-14 LAB — MAGNESIUM: Magnesium: 2.2 mg/dL (ref 1.7–2.4)

## 2020-12-14 LAB — PATHOLOGIST SMEAR REVIEW

## 2020-12-14 LAB — PHOSPHORUS: Phosphorus: 4.4 mg/dL (ref 2.5–4.6)

## 2020-12-14 MED ORDER — ATORVASTATIN CALCIUM 10 MG PO TABS
20.0000 mg | ORAL_TABLET | Freq: Every day | ORAL | Status: DC
  Administered 2020-12-14: 09:00:00 20 mg via ORAL
  Filled 2020-12-14 (×2): qty 2

## 2020-12-14 NOTE — Progress Notes (Addendum)
STROKE TEAM PROGRESS NOTE   INTERVAL HISTORY Her  daughter is at the bedside.  Patient in bed NAD. She presented with what sounds like brief episode of loss of consciousness followed by some confusion disorientation and transient right-sided weakness.  Unclear syncope versus seizure or TIA.  MRI scan of the brain is negative for acute infarct but does show large pituitary macroadenoma.  MRA is of the brain and neck show only mild extracranial stenosis.  LDL cholesterol is 127 mg percent and hemoglobin A1c 6.8.  Vitals:   12/14/20 0330 12/14/20 0345 12/14/20 0645 12/14/20 0730  BP: (!) 154/86 (!) 151/81 (!) 154/83 (!) 156/83  Pulse: 81 83 90 86  Resp: 14 17 18 20   Temp:      TempSrc:      SpO2: 100% 99% 98% 100%  Weight:      Height:       CBC:  Recent Labs  Lab 12/13/20 1635 12/13/20 1641 12/14/20 0431  WBC 15.7*  --  13.6*  NEUTROABS 9.0*  --  10.7*  HGB 13.6 14.6 13.8  HCT 44.5 43.0 44.4  MCV 97.4  --  96.1  PLT 206  --  295   Basic Metabolic Panel:  Recent Labs  Lab 12/13/20 1635 12/13/20 1641 12/14/20 0431  NA 144 144 147*  K 4.1 4.0 3.9  CL 109 110 113*  CO2 21*  --  22  GLUCOSE 124* 119* 152*  BUN 39* 50* 31*  CREATININE 0.98 0.80 0.86  CALCIUM 9.1  --  8.6*  MG  --   --  2.2  PHOS  --   --  4.4   Lipid Panel:  Recent Labs  Lab 12/14/20 0431  CHOL 186  TRIG 68  HDL 45  CHOLHDL 4.1  VLDL 14  LDLCALC 127*   HgbA1c:  Recent Labs  Lab 12/14/20 0431  HGBA1C 6.8*    IMAGING past 24 hours MR ANGIO HEAD WO CONTRAST  Result Date: 12/13/2020 CLINICAL DATA:  Initial evaluation for acute TIA. EXAM: MRI HEAD WITHOUT AND WITH CONTRAST MRA HEAD WITHOUT CONTRAST MRA NECK WITHOUT AND WITH CONTRAST TECHNIQUE: Multiplanar, multiecho pulse sequences of the brain and surrounding structures were obtained without intravenous contrast. Angiographic images of the Circle of Willis were obtained using MRA technique without intravenous contrast. Angiographic images of  the neck were obtained using MRA technique without and with intravenous contrast. Carotid stenosis measurements (when applicable) are obtained utilizing NASCET criteria, using the distal internal carotid diameter as the denominator. CONTRAST:  4mL GADAVIST GADOBUTROL 1 MMOL/ML IV SOLN COMPARISON:  Prior head CT from 12/13/2020. FINDINGS: MRI HEAD FINDINGS Brain: Generalized age-related cerebral atrophy. Patchy and confluent T2/FLAIR hyperintensity within the periventricular deep white matter both cerebral hemispheres most consistent with chronic small vessel ischemic disease. Area of encephalomalacia and gliosis involving the cortical and subcortical aspect of the left frontal lobe consistent with a chronic ischemic infarct. Additional small remote cortical/subcortical infarcts segment at the left parietal lobe. Few small remote lacunar infarcts noted about the bilateral basal ganglia and cerebellum. Additional small remote lacunar infarct noted at the left frontal centrum semi ovale. No abnormal foci of restricted diffusion to suggest acute or subacute ischemia. Gray-white matter differentiation maintained. No evidence for acute intracranial hemorrhage. Few small chronic micro hemorrhages noted at the periventricular white matter of the posterior corona radiata, likely small vessel related. Previously noted sellar/suprasellar mass again seen, measuring 2.0 x 2.5 x 2.8 cm in greatest dimensions, most consistent with a  pituitary macro adenoma. Suprasellar extension to involve the pituitary stalk. Tumor abuts the optic chiasm superiorly which is slightly bowed superiorly and deviated to the right. Evidence for extension into the left cavernous sinus with tumor nearly entirely compass seen the cavernous left ICA. Probable inferior erosion through the clivus with possible extension into the left sphenoid sinus (series 22, image 9). No other mass lesion, mass effect or midline shift. No other abnormal enhancement. No  hydrocephalus or extra-axial fluid collection. Midline structures otherwise intact. Vascular: Major intracranial vascular flow voids are maintained. Skull and upper cervical spine: Craniocervical junction within normal limits. Bone marrow signal intensity normal. No acute scalp soft tissue abnormality. Small lipoma noted at the right frontal scalp. Sinuses/Orbits: Patient status post bilateral ocular lens replacement. Globes and orbital soft tissues demonstrate no acute finding. Scattered mucosal thickening noted throughout the paranasal sinuses. Mastoid air cells are clear. Inner ear structures grossly normal. Other: None. MRA HEAD FINDINGS ANTERIOR CIRCULATION: Visualized distal cervical segments of the internal carotid arteries are widely patent with antegrade flow. Petrous, cavernous, and supraclinoid ICAs widely patent without stenosis. Note made of the pituitary macro adenoma partially encasing the cavernous/supraclinoid left ICA without associated luminal narrowing. Left A1 widely patent. Right A1 hypoplastic and/or absent, accounting for the slightly diminutive right ICA is compared to the left. Normal anterior communicating artery complex. Anterior cerebral arteries patent to their distal aspects without stenosis. No M1 stenosis or occlusion. Negative MCA bifurcations. Distal MCA branches well perfused and symmetric. POSTERIOR CIRCULATION: Both V4 segments widely patent to the vertebrobasilar junction. Left V4 dominant. Both PICA origins patent and normal. Basilar widely patent to its distal aspect. Superior cerebral arteries patent bilaterally. Left PCA supplied via the basilar. Predominant fetal type origin of the right PCA. Both PCAs well perfused or distal aspects. No intracranial aneurysm. MRA NECK FINDINGS AORTIC ARCH: Visualized aortic arch of normal caliber. Bovine branching pattern noted. No hemodynamically significant stenosis seen about the origin of the great vessels. RIGHT CAROTID SYSTEM: Right  CCA widely patent from its origin to the bifurcation. Short-segment atheromatous stenosis of up to 40% by NASCET criteria noted at the origin of the right ICA. Right ICA widely patent distally to the skull base without stenosis, evidence for dissection or occlusion. LEFT CAROTID SYSTEM: Left CCA widely patent from its origin to the bifurcation without stenosis. No significant atheromatous narrowing or irregularity about the left bifurcation. Left ICA widely patent distally without stenosis, evidence for dissection or occlusion. VERTEBRAL ARTERIES: Both vertebral arteries arise from the subclavian arteries. No proximal subclavian artery stenosis. Left vertebral artery dominant. Probable moderate approximate 50% stenosis noted at the origin of the diminutive right vertebral artery. Vertebral arteries otherwise patent within the neck without stenosis, evidence for dissection or occlusion. Enlarged multinodular goiter. Dominant nodule measuring up to approximately 2 cm present at the upper pole of the right thyroid lobe (series 17, image 26). IMPRESSION: MRI HEAD IMPRESSION: 1. No acute intracranial abnormality. 2. Underlying age-related cerebral atrophy with mild chronic microvascular ischemic disease. Superimposed chronic ischemic infarcts involving the left frontal and parietal lobes, with additional scattered remote lacunar infarcts about the left centrum semi ovale, bilateral basal ganglia, and cerebellum. 3. 2.0 x 2.5 x 2.8 cm pituitary macro adenoma with evidence for invasion into the left cavernous sinus, with probable inferior erosion into the left sphenoid sinus. MRA HEAD IMPRESSION: Negative intracranial MRA for large vessel occlusion. No hemodynamically significant or correctable stenosis identified. MRA NECK IMPRESSION: 1. Short-segment 40% atheromatous stenosis at  the origin of the right ICA. 2. Otherwise wide patency of both carotid artery systems within the neck. 3. Short-segment moderate approximate 50%  stenosis at the origin of the right vertebral artery. Vertebral arteries otherwise patent within the neck. Left vertebral artery dominant. 4. Enlarged multinodular thyroid with dominant nodule measuring up to approximately 2 cm at the upper right thyroid lobe. Further evaluation with dedicated thyroid ultrasound recommended for further evaluation. This could be performed on a nonemergent outpatient basis. (Ref: J Am Coll Radiol. 2015 Feb;12(2): 143-50). Electronically Signed   By: Jeannine Boga M.D.   On: 12/13/2020 23:00   MR ANGIO NECK W WO CONTRAST  Result Date: 12/13/2020 CLINICAL DATA:  Initial evaluation for acute TIA. EXAM: MRI HEAD WITHOUT AND WITH CONTRAST MRA HEAD WITHOUT CONTRAST MRA NECK WITHOUT AND WITH CONTRAST TECHNIQUE: Multiplanar, multiecho pulse sequences of the brain and surrounding structures were obtained without intravenous contrast. Angiographic images of the Circle of Willis were obtained using MRA technique without intravenous contrast. Angiographic images of the neck were obtained using MRA technique without and with intravenous contrast. Carotid stenosis measurements (when applicable) are obtained utilizing NASCET criteria, using the distal internal carotid diameter as the denominator. CONTRAST:  31mL GADAVIST GADOBUTROL 1 MMOL/ML IV SOLN COMPARISON:  Prior head CT from 12/13/2020. FINDINGS: MRI HEAD FINDINGS Brain: Generalized age-related cerebral atrophy. Patchy and confluent T2/FLAIR hyperintensity within the periventricular deep white matter both cerebral hemispheres most consistent with chronic small vessel ischemic disease. Area of encephalomalacia and gliosis involving the cortical and subcortical aspect of the left frontal lobe consistent with a chronic ischemic infarct. Additional small remote cortical/subcortical infarcts segment at the left parietal lobe. Few small remote lacunar infarcts noted about the bilateral basal ganglia and cerebellum. Additional small remote  lacunar infarct noted at the left frontal centrum semi ovale. No abnormal foci of restricted diffusion to suggest acute or subacute ischemia. Gray-white matter differentiation maintained. No evidence for acute intracranial hemorrhage. Few small chronic micro hemorrhages noted at the periventricular white matter of the posterior corona radiata, likely small vessel related. Previously noted sellar/suprasellar mass again seen, measuring 2.0 x 2.5 x 2.8 cm in greatest dimensions, most consistent with a pituitary macro adenoma. Suprasellar extension to involve the pituitary stalk. Tumor abuts the optic chiasm superiorly which is slightly bowed superiorly and deviated to the right. Evidence for extension into the left cavernous sinus with tumor nearly entirely compass seen the cavernous left ICA. Probable inferior erosion through the clivus with possible extension into the left sphenoid sinus (series 22, image 9). No other mass lesion, mass effect or midline shift. No other abnormal enhancement. No hydrocephalus or extra-axial fluid collection. Midline structures otherwise intact. Vascular: Major intracranial vascular flow voids are maintained. Skull and upper cervical spine: Craniocervical junction within normal limits. Bone marrow signal intensity normal. No acute scalp soft tissue abnormality. Small lipoma noted at the right frontal scalp. Sinuses/Orbits: Patient status post bilateral ocular lens replacement. Globes and orbital soft tissues demonstrate no acute finding. Scattered mucosal thickening noted throughout the paranasal sinuses. Mastoid air cells are clear. Inner ear structures grossly normal. Other: None. MRA HEAD FINDINGS ANTERIOR CIRCULATION: Visualized distal cervical segments of the internal carotid arteries are widely patent with antegrade flow. Petrous, cavernous, and supraclinoid ICAs widely patent without stenosis. Note made of the pituitary macro adenoma partially encasing the cavernous/supraclinoid  left ICA without associated luminal narrowing. Left A1 widely patent. Right A1 hypoplastic and/or absent, accounting for the slightly diminutive right ICA is compared to  the left. Normal anterior communicating artery complex. Anterior cerebral arteries patent to their distal aspects without stenosis. No M1 stenosis or occlusion. Negative MCA bifurcations. Distal MCA branches well perfused and symmetric. POSTERIOR CIRCULATION: Both V4 segments widely patent to the vertebrobasilar junction. Left V4 dominant. Both PICA origins patent and normal. Basilar widely patent to its distal aspect. Superior cerebral arteries patent bilaterally. Left PCA supplied via the basilar. Predominant fetal type origin of the right PCA. Both PCAs well perfused or distal aspects. No intracranial aneurysm. MRA NECK FINDINGS AORTIC ARCH: Visualized aortic arch of normal caliber. Bovine branching pattern noted. No hemodynamically significant stenosis seen about the origin of the great vessels. RIGHT CAROTID SYSTEM: Right CCA widely patent from its origin to the bifurcation. Short-segment atheromatous stenosis of up to 40% by NASCET criteria noted at the origin of the right ICA. Right ICA widely patent distally to the skull base without stenosis, evidence for dissection or occlusion. LEFT CAROTID SYSTEM: Left CCA widely patent from its origin to the bifurcation without stenosis. No significant atheromatous narrowing or irregularity about the left bifurcation. Left ICA widely patent distally without stenosis, evidence for dissection or occlusion. VERTEBRAL ARTERIES: Both vertebral arteries arise from the subclavian arteries. No proximal subclavian artery stenosis. Left vertebral artery dominant. Probable moderate approximate 50% stenosis noted at the origin of the diminutive right vertebral artery. Vertebral arteries otherwise patent within the neck without stenosis, evidence for dissection or occlusion. Enlarged multinodular goiter. Dominant  nodule measuring up to approximately 2 cm present at the upper pole of the right thyroid lobe (series 17, image 26). IMPRESSION: MRI HEAD IMPRESSION: 1. No acute intracranial abnormality. 2. Underlying age-related cerebral atrophy with mild chronic microvascular ischemic disease. Superimposed chronic ischemic infarcts involving the left frontal and parietal lobes, with additional scattered remote lacunar infarcts about the left centrum semi ovale, bilateral basal ganglia, and cerebellum. 3. 2.0 x 2.5 x 2.8 cm pituitary macro adenoma with evidence for invasion into the left cavernous sinus, with probable inferior erosion into the left sphenoid sinus. MRA HEAD IMPRESSION: Negative intracranial MRA for large vessel occlusion. No hemodynamically significant or correctable stenosis identified. MRA NECK IMPRESSION: 1. Short-segment 40% atheromatous stenosis at the origin of the right ICA. 2. Otherwise wide patency of both carotid artery systems within the neck. 3. Short-segment moderate approximate 50% stenosis at the origin of the right vertebral artery. Vertebral arteries otherwise patent within the neck. Left vertebral artery dominant. 4. Enlarged multinodular thyroid with dominant nodule measuring up to approximately 2 cm at the upper right thyroid lobe. Further evaluation with dedicated thyroid ultrasound recommended for further evaluation. This could be performed on a nonemergent outpatient basis. (Ref: J Am Coll Radiol. 2015 Feb;12(2): 143-50). Electronically Signed   By: Jeannine Boga M.D.   On: 12/13/2020 23:00   MR BRAIN W WO CONTRAST  Result Date: 12/13/2020 CLINICAL DATA:  Initial evaluation for acute TIA. EXAM: MRI HEAD WITHOUT AND WITH CONTRAST MRA HEAD WITHOUT CONTRAST MRA NECK WITHOUT AND WITH CONTRAST TECHNIQUE: Multiplanar, multiecho pulse sequences of the brain and surrounding structures were obtained without intravenous contrast. Angiographic images of the Circle of Willis were obtained  using MRA technique without intravenous contrast. Angiographic images of the neck were obtained using MRA technique without and with intravenous contrast. Carotid stenosis measurements (when applicable) are obtained utilizing NASCET criteria, using the distal internal carotid diameter as the denominator. CONTRAST:  57mL GADAVIST GADOBUTROL 1 MMOL/ML IV SOLN COMPARISON:  Prior head CT from 12/13/2020. FINDINGS: MRI HEAD FINDINGS  Brain: Generalized age-related cerebral atrophy. Patchy and confluent T2/FLAIR hyperintensity within the periventricular deep white matter both cerebral hemispheres most consistent with chronic small vessel ischemic disease. Area of encephalomalacia and gliosis involving the cortical and subcortical aspect of the left frontal lobe consistent with a chronic ischemic infarct. Additional small remote cortical/subcortical infarcts segment at the left parietal lobe. Few small remote lacunar infarcts noted about the bilateral basal ganglia and cerebellum. Additional small remote lacunar infarct noted at the left frontal centrum semi ovale. No abnormal foci of restricted diffusion to suggest acute or subacute ischemia. Gray-white matter differentiation maintained. No evidence for acute intracranial hemorrhage. Few small chronic micro hemorrhages noted at the periventricular white matter of the posterior corona radiata, likely small vessel related. Previously noted sellar/suprasellar mass again seen, measuring 2.0 x 2.5 x 2.8 cm in greatest dimensions, most consistent with a pituitary macro adenoma. Suprasellar extension to involve the pituitary stalk. Tumor abuts the optic chiasm superiorly which is slightly bowed superiorly and deviated to the right. Evidence for extension into the left cavernous sinus with tumor nearly entirely compass seen the cavernous left ICA. Probable inferior erosion through the clivus with possible extension into the left sphenoid sinus (series 22, image 9). No other mass  lesion, mass effect or midline shift. No other abnormal enhancement. No hydrocephalus or extra-axial fluid collection. Midline structures otherwise intact. Vascular: Major intracranial vascular flow voids are maintained. Skull and upper cervical spine: Craniocervical junction within normal limits. Bone marrow signal intensity normal. No acute scalp soft tissue abnormality. Small lipoma noted at the right frontal scalp. Sinuses/Orbits: Patient status post bilateral ocular lens replacement. Globes and orbital soft tissues demonstrate no acute finding. Scattered mucosal thickening noted throughout the paranasal sinuses. Mastoid air cells are clear. Inner ear structures grossly normal. Other: None. MRA HEAD FINDINGS ANTERIOR CIRCULATION: Visualized distal cervical segments of the internal carotid arteries are widely patent with antegrade flow. Petrous, cavernous, and supraclinoid ICAs widely patent without stenosis. Note made of the pituitary macro adenoma partially encasing the cavernous/supraclinoid left ICA without associated luminal narrowing. Left A1 widely patent. Right A1 hypoplastic and/or absent, accounting for the slightly diminutive right ICA is compared to the left. Normal anterior communicating artery complex. Anterior cerebral arteries patent to their distal aspects without stenosis. No M1 stenosis or occlusion. Negative MCA bifurcations. Distal MCA branches well perfused and symmetric. POSTERIOR CIRCULATION: Both V4 segments widely patent to the vertebrobasilar junction. Left V4 dominant. Both PICA origins patent and normal. Basilar widely patent to its distal aspect. Superior cerebral arteries patent bilaterally. Left PCA supplied via the basilar. Predominant fetal type origin of the right PCA. Both PCAs well perfused or distal aspects. No intracranial aneurysm. MRA NECK FINDINGS AORTIC ARCH: Visualized aortic arch of normal caliber. Bovine branching pattern noted. No hemodynamically significant stenosis  seen about the origin of the great vessels. RIGHT CAROTID SYSTEM: Right CCA widely patent from its origin to the bifurcation. Short-segment atheromatous stenosis of up to 40% by NASCET criteria noted at the origin of the right ICA. Right ICA widely patent distally to the skull base without stenosis, evidence for dissection or occlusion. LEFT CAROTID SYSTEM: Left CCA widely patent from its origin to the bifurcation without stenosis. No significant atheromatous narrowing or irregularity about the left bifurcation. Left ICA widely patent distally without stenosis, evidence for dissection or occlusion. VERTEBRAL ARTERIES: Both vertebral arteries arise from the subclavian arteries. No proximal subclavian artery stenosis. Left vertebral artery dominant. Probable moderate approximate 50% stenosis noted at the  origin of the diminutive right vertebral artery. Vertebral arteries otherwise patent within the neck without stenosis, evidence for dissection or occlusion. Enlarged multinodular goiter. Dominant nodule measuring up to approximately 2 cm present at the upper pole of the right thyroid lobe (series 17, image 26). IMPRESSION: MRI HEAD IMPRESSION: 1. No acute intracranial abnormality. 2. Underlying age-related cerebral atrophy with mild chronic microvascular ischemic disease. Superimposed chronic ischemic infarcts involving the left frontal and parietal lobes, with additional scattered remote lacunar infarcts about the left centrum semi ovale, bilateral basal ganglia, and cerebellum. 3. 2.0 x 2.5 x 2.8 cm pituitary macro adenoma with evidence for invasion into the left cavernous sinus, with probable inferior erosion into the left sphenoid sinus. MRA HEAD IMPRESSION: Negative intracranial MRA for large vessel occlusion. No hemodynamically significant or correctable stenosis identified. MRA NECK IMPRESSION: 1. Short-segment 40% atheromatous stenosis at the origin of the right ICA. 2. Otherwise wide patency of both carotid  artery systems within the neck. 3. Short-segment moderate approximate 50% stenosis at the origin of the right vertebral artery. Vertebral arteries otherwise patent within the neck. Left vertebral artery dominant. 4. Enlarged multinodular thyroid with dominant nodule measuring up to approximately 2 cm at the upper right thyroid lobe. Further evaluation with dedicated thyroid ultrasound recommended for further evaluation. This could be performed on a nonemergent outpatient basis. (Ref: J Am Coll Radiol. 2015 Feb;12(2): 143-50). Electronically Signed   By: Jeannine Boga M.D.   On: 12/13/2020 23:00   DG Chest Portable 1 View  Result Date: 12/13/2020 CLINICAL DATA:  Code stroke.  Syncopal episode. EXAM: PORTABLE CHEST 1 VIEW COMPARISON:  None. FINDINGS: Patient is rotated to the right. Monitoring leads overlie the patient. Cardiac contours normal in size. Low lung volumes. Bibasilar heterogeneous opacities. Nodular opacity projects over the right upper hemithorax. No pleural effusion or pneumothorax. Thoracic spine degenerative changes. IMPRESSION: Nodular opacity projecting over the right upper hemithorax likely represents the first rib given the patient rotation. Recommend dedicated evaluation with repeat PA and lateral chest radiograph to exclude the possibility of pulmonary nodule. Heterogeneous opacities left greater than right lung bases favored to represent atelectasis. Infection not excluded. Electronically Signed   By: Lovey Newcomer M.D.   On: 12/13/2020 17:41   CT HEAD CODE STROKE WO CONTRAST  Result Date: 12/13/2020 CLINICAL DATA:  Code stroke. Right sided deficits. Acute stroke presentation. EXAM: CT HEAD WITHOUT CONTRAST TECHNIQUE: Contiguous axial images were obtained from the base of the skull through the vertex without intravenous contrast. COMPARISON:  None. FINDINGS: Brain: Age related atrophy. No focal abnormality affects the brainstem or cerebellum. Cerebral hemispheres show chronic  small-vessel ischemic changes of the white matter. There is an old left posterior frontal vertex cortical infarction. No sign of acute infarction, intra-axial mass lesion, hemorrhage, hydrocephalus or extra-axial collection. The patient has a sellar mass with suprasellar extension most likely to represent a pituitary adenoma. Vascular: There is atherosclerotic calcification of the major vessels at the base of the brain. Skull: Negative Sinuses/Orbits: Clear except for chronic inflammatory changes of the left division of the sphenoid sinus. Orbits negative. Other: None ASPECTS ([place] Stroke Program Early CT Score) - Ganglionic level infarction (caudate, lentiform nuclei, internal capsule, insula, M1-M3 cortex): 7 - Supraganglionic infarction (M4-M6 cortex): 3 Total score (0-10 with 10 being normal): 10 IMPRESSION: 1. No acute finding by CT. Atrophy and chronic small-vessel ischemic changes. 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 2. Sellar mass with suprasellar extension most likely to represent a pituitary adenoma. 3.  ASPECTS is 10. 4. These results were communicated to Dr. Theda Sers At 4:44 pmon 12/19/2021by text page via the [facility] messaging system. Electronically Signed   By: Nelson Chimes M.D.   On: 12/13/2020 16:45    PHYSICAL EXAM Pleasant elderly Caucasian lady not in distress.  She is hard of hearing. . Afebrile. Head is nontraumatic. Neck is supple without bruit.    Cardiac exam no murmur or gallop. Lungs are clear to auscultation. Distal pulses are well felt. Neurological Exam ;  Awake  Alert oriented x 1.  Diminished attention, registration and recall.  Poor insight into her condition.  Normal speech and language.eye movements full without nystagmus.fundi were not visualized. Vision acuity and fields appear normal. Hearing is diminished bilaterally.. Palatal movements are normal. Face symmetric. Tongue midline. Normal strength, tone, reflexes and coordination. Normal sensation. Gait  deferred.  ASSESSMENT/PLAN Ms. Christine Newton Daily is a 86 y.o. female with history of HTN and DM presenting with syncope.    Possible left brain TIA in the setting of syncopal event:  Code Stroke CT head 1. No acute finding by CT. Atrophy and chronic small-vessel ischemicchanges. 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 Sellar mass with suprasellar extension most likely to represent a pituitary adenoma.3. ASPECTS is 10.  MRI /MRA head and neck : MRI HEAD IMPRESSION: 1. No acute intracranial abnormality. 2. Underlying age-related cerebral atrophy with mild chronic microvascular ischemic disease. Superimposed chronic ischemic infarcts involving the left frontal and parietal lobes, with additional scattered remote lacunar infarcts about the left centrum semi ovale, bilateral basal ganglia, and cerebellum. 3. 2.0 x 2.5 x 2.8 cm pituitary macro adenoma with evidence for invasion into the left cavernous sinus, with probable inferior erosion into the left sphenoid sinus.  MRA HEAD IMPRESSION:  Negative intracranial MRA for large vessel occlusion. No hemodynamically significant or correctable stenosis identified.  MRA NECK IMPRESSION:  1. Short-segment 40% atheromatous stenosis at the origin of the right ICA. 2. Otherwise wide patency of both carotid artery systems within the neck. 3. Short-segment moderate approximate 50% stenosis at the origin of the right vertebral artery. Vertebral arteries otherwise patent within the neck. Left vertebral artery dominant. 4. Enlarged multinodular thyroid with dominant nodule measuring up to approximately 2 cm at the upper right thyroid lobe. Further evaluation with dedicated thyroid ultrasound recommended for further evaluation. This could be performed on a nonemergent outpatient  basis.   2D Echo pending  LDL 127  HgbA1c 6.8  VTE prophylaxis - lovenox    Diet   Diet Heart Room service appropriate? Yes; Fluid  consistency: Thin     aspirin 81 mg daily prior to admission, now on aspirin 81 mg daily and clopidogrel 75 mg daily.   Therapy recommendations:  pending  Disposition:  TBD  Hypertension  Home meds:  norvasc 5 mg daily  Stable . Permissive hypertension (OK if < 220/120) but gradually normalize in 5-7 days . Long-term BP goal normotensive  Hyperlipidemia  Home meds:  Atorvastatin 10mg , increased in hospital to atorvastatin 20 mg daily  LDL 127, goal < 70  Continue statin at discharge  Diabetes type II Controlled  Home meds:  Metformin 500 mg daily  HgbA1c 6.8, goal < 7.0  CBGs Recent Labs    12/13/20 1634 12/13/20 2206  GLUCAP 105* 104*       Other Stroke Risk Factors  Advanced Age >/= 78   Other Active Problems  Memory issues per daughter some dementia at [facility] day # 1  Christine Morale, MSN, NP-C [facility] [phone] Patient has had bovine cardiac valve surgery in the past and may have significant underlying cardiac disease which could have contributed to syncopal event.  (TIA in the setting of syncope may be due to small vessel disease.  Check EEG for seizures and 2D echocardiogram and continue cardiac monitoring.  Long discussion with the patient and daughter and Dr. Dwyane Dee and answered questions.  Greater than 50% time during this 35-minute visit was spent in counseling and coordination of care about possible syncopal episode and TIA and answering questions and discussion with care team. Antony Contras, MD To contact Stroke Continuity provider, please refer to [email or web address] After hours, contact General Neurology

## 2020-12-14 NOTE — Progress Notes (Signed)
EEG complete - results pending 

## 2020-12-14 NOTE — Hospital Course (Addendum)
Christine Newton is a 86 y.o. female with hx of non-insulin-dependent type 2 diabetes, hypertension, dementia, aortic valve repair over with bioprosthesis in 2014 (only on baby aspirin daily), who presented to the hospital on 12/13/2020 via EMS secondary to altered mental status.  Patient was a poor historian on admission and her daughter helped provide collateral information.   Apparently patient was eating lunch when the daughter noticed she was slumped over and altered, not responsive, but no loss of consciousness.  Patient herself says she thinks she "blacked out".  She then helped her mother to the ground and called EMS for help.  They deny any similar symptoms in the past.  She has been in her normal state of health over the past week.  No recent changes in medication.  No history of being on anticoagulation but again is on daily aspirin daily.  At home the patient's blood pressure usually runs around SBP 110s.  Code stroke was initially called on arrival to the ER.  CT head showed chronic small vessel ischemic changes and old left-sided infarct.  She also has a known pituitary macroadenoma which they have been undergoing evaluation outpatient.  She was evaluated by neurology during hospitalization as well.  She underwent EEG testing.  Her presentation was considered combination of TIA likely due to underlying hypotension and syncopal episode at home prior to admission as well as possible underlying seizure activity given slowing noted on EEG per neurology. She was continued on aspirin and Plavix.  In addition, she was also started on Keppra XR 500 mg daily per neurology recommendations. She will follow up outpatient with neurology at discharge as well.  She had some hypotension at home prior to admission which was considered to have contributed to her syncopal episode.  Amlodipine was discontinued and she was continued on lisinopril 20 mg daily.  They were instructed to monitor blood pressure at  home and could increase lisinopril dosing if needed prior to reinitiation of amlodipine (ideally would like to max out lisinopril dose 1st).  Daughter had voiced understanding to these instructions.

## 2020-12-14 NOTE — Evaluation (Signed)
Physical Therapy Evaluation Patient Details Name: Christine Newton MRN: [id number] DOB: October 04, 1933 Today's Date: 12/14/2020   History of Present Illness  Pt is an 86 y/o female admitted secondary to AMS. MRI negative for acute infarct but did show pituitary mass. PMH includes HTN, DM, and dementia.  Clinical Impression  Pt admitted secondary to problem above with deficits below. Pt requiring min to min guard A for mobility this session. Dementia at baseline and reports she lives with her daughter. Daughter present to confirm. Feel pt would benefit from HHPT at d/c. Will continue to follow acutely.     Follow Up Recommendations Home health PT;Supervision/Assistance - 24 hour    Equipment Recommendations  None recommended by PT    Recommendations for Other Services       Precautions / Restrictions Precautions Precautions: Fall Restrictions Weight Bearing Restrictions: No      Mobility  Bed Mobility Overal bed mobility: Needs Assistance Bed Mobility: Supine to Sit;Sit to Supine     Supine to sit: Min assist Sit to supine: Min assist   General bed mobility comments: Min A for trunk assist to come to sitting. Min A For LE assist to return to supine.    Transfers Overall transfer level: Needs assistance Equipment used: 1 person hand held assist Transfers: Sit to/from Stand Sit to Stand: Min guard         General transfer comment: Min guard for safety to stand from higher stretcher height.  Ambulation/Gait Ambulation/Gait assistance: Min guard   Assistive device: 1 person hand held assist Gait Pattern/deviations: Step-through pattern;Decreased stride length Gait velocity: Decreased   General Gait Details: Pt took steps to and from stretcher as she was connected to the monitor. Min guard for safety and pt holding to PT arms for support.  Stairs            Wheelchair Mobility    Modified Rankin (Stroke Patients Only)       Balance Overall balance  assessment: Mild deficits observed, not formally tested                                           Pertinent Vitals/Pain Pain Assessment: No/denies pain    Home Living Family/patient expects to be discharged to:: Private residence Living Arrangements: Children Available Help at Discharge: Family Type of Home: House Home Access: Level entry     Home Layout: One level Home Equipment: Toilet riser;Walker - 2 wheels;Tub bench Additional Comments: Aide comes 5 days/week for ~4 hours    Prior Function Level of Independence: Needs assistance   Gait / Transfers Assistance Needed: Uses RW for ambulation  ADL's / Homemaking Assistance Needed: Aide assists with bathing/dressing        Hand Dominance        Extremity/Trunk Assessment   Upper Extremity Assessment Upper Extremity Assessment: Defer to OT evaluation    Lower Extremity Assessment Lower Extremity Assessment: Generalized weakness    Cervical / Trunk Assessment Cervical / Trunk Assessment: Normal  Communication   Communication: No difficulties  Cognition Arousal/Alertness: Awake/alert Behavior During Therapy: WFL for tasks assessed/performed Overall Cognitive Status: History of cognitive impairments - at baseline                                 General Comments: Dementia at baseline  General Comments General comments (skin integrity, edema, etc.): Pt's daughter present during session    Exercises     Assessment/Plan    PT Assessment Patient needs continued PT services  PT Problem List Decreased strength;Decreased balance;Decreased mobility;Decreased knowledge of use of DME;Decreased knowledge of precautions       PT Treatment Interventions DME instruction;Gait training;Functional mobility training;Therapeutic activities;Therapeutic exercise;Balance training;Patient/family education    PT Goals (Current goals can be found in the Care Plan section)  Acute Rehab PT  Goals Patient Stated Goal: to go home PT Goal Formulation: With patient Time For Goal Achievement: 12/28/20 Potential to Achieve Goals: Fair    Frequency Min 3X/week   Barriers to discharge        Co-evaluation               AM-PAC PT "6 Clicks" Mobility  Outcome Measure Help needed turning from your back to your side while in a flat bed without using bedrails?: A Little Help needed moving from lying on your back to sitting on the side of a flat bed without using bedrails?: A Little Help needed moving to and from a bed to a chair (including a wheelchair)?: A Little Help needed standing up from a chair using your arms (e.g., wheelchair or bedside chair)?: A Little Help needed to walk in hospital room?: A Little Help needed climbing 3-5 steps with a railing? : A Lot 6 Click Score: 17    End of Session Equipment Utilized During Treatment: Gait belt Activity Tolerance: Patient tolerated treatment well Patient left: in bed;with call bell/phone within reach;with family/visitor present (on stretcher in ED) Nurse Communication: Mobility status PT Visit Diagnosis: Unsteadiness on feet (R26.81);Muscle weakness (generalized) (M62.81)    Time: 4580-9983 PT Time Calculation (min) (ACUTE ONLY): 16 min   Charges:   PT Evaluation $PT Eval Low Complexity: 1 Low          Lou Miner, DPT  Acute Rehabilitation Services  Pager: [phone] Office: [phone]   Rudean Hitt 12/14/2020, 10:47 AM

## 2020-12-14 NOTE — Procedures (Signed)
Patient Name: Christine Newton  MRN: [id number]  Epilepsy Attending: Lora Havens  Referring Physician/Provider: Laurey Morale, NP  Date:  12/14/2020 Duration: 25.01 mins  Patient history: 86 y.o. female with history of HTN and DM presenting with syncope. EEG to evaluate for seizure  Level of alertness: Awake  AEDs during EEG study: None  Technical aspects: This EEG study was done with scalp electrodes positioned according to the 10-20 International system of electrode placement. Electrical activity was acquired at a sampling rate of 500Hz  and reviewed with a high frequency filter of 70Hz  and a low frequency filter of 1Hz . EEG data were recorded continuously and digitally stored.   Description: The posterior dominant rhythm consists of 8 Hz activity of moderate voltage (25-35 uV) seen predominantly in posterior head regions, symmetric and reactive to eye opening and eye closing.  EEG showed intermittent left temporal 2-3Hz  delta slowing. Hyperventilation and photic stimulation were not performed.     ABNORMALITY -Intermittent slow, left temporal region  IMPRESSION: This study is suggestive of nonspecific cortical dysfunction in left temporal lobe. No seizures or epileptiform discharges were seen throughout the recording.  Christine Newton Barbra Sarks

## 2020-12-14 NOTE — Progress Notes (Signed)
PROGRESS NOTE    Christine Newton   KGU:[id number]  DOB: 07-14-1933  DOA: 12/13/2020     1  PCP: Lillard Anes, MD  CC: AMS, syncope  Hospital Course: Christine Newton is a 86 y.o. female with hx of non-insulin-dependent type 2 diabetes, hypertension, dementia, aortic valve repair over with bioprosthesis in 2014 (only on baby aspirin daily), who presented to the hospital on 12/13/2020 via EMS secondary to altered mental status.  Patient was a poor historian on admission and her daughter helped provide collateral information.   Apparently patient was eating lunch when the daughter noticed she was slumped over and altered, not responsive, but no loss of consciousness.  Patient herself says she thinks she "blacked out".  She then helped her mother to the ground and called EMS for help.  They deny any similar symptoms in the past.  She has been in her normal state of health over the past week.  No recent changes in medication.  No history of being on anticoagulation but again is on daily aspirin daily.  At home the patient's blood pressure usually runs around SBP 110s.  Code stroke was initially called on arrival to the ER.  CT head showed chronic small vessel ischemic changes and old left-sided infarct.  She also has a known pituitary macroadenoma which they have been undergoing evaluation outpatient.   Interval History:  Patient seen in the ER this morning with daughter bedside.  Her confusion seems to be still present but improving some.  Findings of work-up discussed with questions answered.  Tentative plan is to finish the work-up and probable discharge home tomorrow which they are amenable with.  Old records reviewed in assessment of this patient  ROS: Constitutional: negative for chills and fevers, Respiratory: negative for cough, Cardiovascular: negative for chest pain and Gastrointestinal: negative for abdominal pain  Assessment & Plan: TIA -Patient presents from  home with acute mental status changes, and right-sided weakness which has currently resolved; concern for TIA; neuro seems to agree as well; likely precipitated from hypotension/syncope at home (BP likely too low given her age and co-morbidities) -CT head on admission reviewed, no acute processes but findings consistent with chronic small vessel disease and previous/old infarct -Daily aspirin and Plavix - lipitor -Neurochecks -Echo: EF 50-55%, Gr 1 DD  Pituitary mass -CT head without contrast reviewed, sellar mass likely representing pituitary adenoma - patient following outpatient with PCP for this already; continue outpatient eval - no obvious symptoms at this time  Essential hypertension -Hold home blood pressure medications, however permissive hypertension in the setting of possible TIA -Continue close monitoring of BP   Non-insulin-dependent type 2 diabetes -Hold home metformin -Hemoglobin A1c 6.8% -Glucose checks, sliding scale AC & HS  Thyroid nodules -Dominant nodule measures approximately 2 cm at the upper right thyroid lobe -Findings discussed with daughter and patient bedside -Outpatient follow-up for further evaluation including ultrasound -TSH normal, 0.564  Essential hypertension -Hold home blood pressure medications, however permissive hypertension in the setting of possible TIA -Continue close monitoring of BP  Non-insulin-dependent type 2 diabetes -Hold home metformin -Hemoglobin A1c 6.8% -Glucose checks, sliding scale AC & HS  Pyuria -Urinalysis on admission: Leukocytes trace, nitrite negative, bacteria many, WBC 0-5 -She remains asymptomatic, no indication for antibiotics still at this time  Antimicrobials:   DVT prophylaxis: Lovenox Code Status: Full Family Communication: Daughter bedside Disposition Plan: Status is: Observation  The patient remains OBS appropriate and will d/c before 2 midnights.  Dispo:  The patient is from: Home               Anticipated d/c is to: Home              Anticipated d/c date is: 1 day              Patient currently is not medically stable to d/c.  Objective: Blood pressure (!) 167/83, pulse 75, temperature 97.9 F (36.6 C), temperature source Oral, resp. rate 20, height 5\' 6"  (1.676 m), weight 63.7 kg, SpO2 100 %.  Examination: General appearance: alert, cooperative and no distress Head: Normocephalic, without obvious abnormality, atraumatic Eyes: EOMI Lungs: clear to auscultation bilaterally Heart: regular rate and rhythm and S1, S2 normal Abdomen: normal findings: bowel sounds normal and soft, non-tender Extremities: No edema Skin: mobility and turgor normal Neurologic: Grossly normal  Consultants:   Neurology  Procedures:     Data Reviewed: I have personally reviewed following labs and imaging studies Results for orders placed or performed during the hospital encounter of 12/13/20 (from the past 24 hour(s))  Troponin I (High Sensitivity)     Status: None   Collection Time: 12/13/20  8:24 PM  Result Value Ref Range   Troponin I (High Sensitivity) 9 <18 ng/L  CBG monitoring, ED     Status: Abnormal   Collection Time: 12/13/20 10:06 PM  Result Value Ref Range   Glucose-Capillary 104 (H) 70 - 99 mg/dL  Hemoglobin A1c     Status: Abnormal   Collection Time: 12/14/20  4:31 AM  Result Value Ref Range   Hgb A1c MFr Bld 6.8 (H) 4.8 - 5.6 %   Mean Plasma Glucose 148.46 mg/dL  CBC with Differential/Platelet     Status: Abnormal   Collection Time: 12/14/20  4:31 AM  Result Value Ref Range   WBC 13.6 (H) 4.0 - 10.5 K/uL   RBC 4.62 3.87 - 5.11 MIL/uL   Hemoglobin 13.8 12.0 - 15.0 g/dL   HCT 44.4 36.0 - 46.0 %   MCV 96.1 80.0 - 100.0 fL   MCH 29.9 26.0 - 34.0 pg   MCHC 31.1 30.0 - 36.0 g/dL   RDW 15.4 11.5 - 15.5 %   Platelets 195 150 - 400 K/uL   nRBC 0.0 0.0 - 0.2 %   Neutrophils Relative % 80 %   Neutro Abs 10.7 (H) 1.7 - 7.7 K/uL   Lymphocytes Relative 11 %   Lymphs Abs  1.5 0.7 - 4.0 K/uL   Monocytes Relative 3 %   Monocytes Absolute 0.5 0.1 - 1.0 K/uL   Eosinophils Relative 0 %   Eosinophils Absolute 0.1 0.0 - 0.5 K/uL   Basophils Relative 1 %   Basophils Absolute 0.1 0.0 - 0.1 K/uL   Immature Granulocytes 5 %   Abs Immature Granulocytes 0.68 (H) 0.00 - 0.07 K/uL  Basic metabolic panel     Status: Abnormal   Collection Time: 12/14/20  4:31 AM  Result Value Ref Range   Sodium 147 (H) 135 - 145 mmol/L   Potassium 3.9 3.5 - 5.1 mmol/L   Chloride 113 (H) 98 - 111 mmol/L   CO2 22 22 - 32 mmol/L   Glucose, Bld 152 (H) 70 - 99 mg/dL   BUN 31 (H) 8 - 23 mg/dL   Creatinine, Ser 0.86 0.44 - 1.00 mg/dL   Calcium 8.6 (L) 8.9 - 10.3 mg/dL   GFR, Estimated >60 >60 mL/min   Anion gap 12 5 - 15  Magnesium  Status: None   Collection Time: 12/14/20  4:31 AM  Result Value Ref Range   Magnesium 2.2 1.7 - 2.4 mg/dL  Phosphorus     Status: None   Collection Time: 12/14/20  4:31 AM  Result Value Ref Range   Phosphorus 4.4 2.5 - 4.6 mg/dL  TSH     Status: None   Collection Time: 12/14/20  4:31 AM  Result Value Ref Range   TSH 0.564 0.350 - 4.500 uIU/mL  Lipid panel     Status: Abnormal   Collection Time: 12/14/20  4:31 AM  Result Value Ref Range   Cholesterol 186 0 - 200 mg/dL   Triglycerides 68 <150 mg/dL   HDL 45 >40 mg/dL   Total CHOL/HDL Ratio 4.1 RATIO   VLDL 14 0 - 40 mg/dL   LDL Cholesterol 127 (H) 0 - 99 mg/dL  CBG monitoring, ED     Status: Abnormal   Collection Time: 12/14/20  8:20 AM  Result Value Ref Range   Glucose-Capillary 137 (H) 70 - 99 mg/dL   Comment 1 Notify RN    Comment 2 Document in Chart   CBG monitoring, ED     Status: Abnormal   Collection Time: 12/14/20 11:32 AM  Result Value Ref Range   Glucose-Capillary 185 (H) 70 - 99 mg/dL   Comment 1 Notify RN    Comment 2 Document in Chart   Glucose, capillary     Status: Abnormal   Collection Time: 12/14/20  6:22 PM  Result Value Ref Range   Glucose-Capillary 241 (H) 70 - 99  mg/dL    Recent Results (from the past 240 hour(s))  Resp Panel by RT-PCR (Flu A&B, Covid) Nasopharyngeal Swab     Status: None   Collection Time: 12/13/20  5:51 PM   Specimen: Nasopharyngeal Swab; Nasopharyngeal(NP) swabs in vial transport medium  Result Value Ref Range Status   SARS Coronavirus 2 by RT PCR NEGATIVE NEGATIVE Final    Comment: (NOTE) SARS-CoV-2 target nucleic acids are NOT DETECTED.  The SARS-CoV-2 RNA is generally detectable in upper respiratory specimens during the acute phase of infection. The lowest concentration of SARS-CoV-2 viral copies this assay can detect is 138 copies/mL. A negative result does not preclude SARS-Cov-2 infection and should not be used as the sole basis for treatment or other patient management decisions. A negative result may occur with  improper specimen collection/handling, submission of specimen other than nasopharyngeal swab, presence of viral mutation(s) within the areas targeted by this assay, and inadequate number of viral copies(<138 copies/mL). A negative result must be combined with clinical observations, patient history, and epidemiological information. The expected result is Negative.  Fact Sheet for Patients:  EntrepreneurPulse.com.au  Fact Sheet for Healthcare Providers:  IncredibleEmployment.be  This test is no t yet approved or cleared by the [place] FDA and  has been authorized for detection and/or diagnosis of SARS-CoV-2 by FDA under an Emergency Use Authorization (EUA). This EUA will remain  in effect (meaning this test can be used) for the duration of the COVID-19 declaration under Section 564(b)(1) of the Act, 21 U.S.C.section 360bbb-3(b)(1), unless the authorization is terminated  or revoked sooner.       Influenza A by PCR NEGATIVE NEGATIVE Final   Influenza B by PCR NEGATIVE NEGATIVE Final    Comment: (NOTE) The Xpert Xpress SARS-CoV-2/FLU/RSV plus assay is intended  as an aid in the diagnosis of influenza from Nasopharyngeal swab specimens and should not be used as a sole basis  for treatment. Nasal washings and aspirates are unacceptable for Xpert Xpress SARS-CoV-2/FLU/RSV testing.  Fact Sheet for Patients: EntrepreneurPulse.com.au  Fact Sheet for Healthcare Providers: IncredibleEmployment.be  This test is not yet approved or cleared by the [place] FDA and has been authorized for detection and/or diagnosis of SARS-CoV-2 by FDA under an Emergency Use Authorization (EUA). This EUA will remain in effect (meaning this test can be used) for the duration of the COVID-19 declaration under Section 564(b)(1) of the Act, 21 U.S.C. section 360bbb-3(b)(1), unless the authorization is terminated or revoked.  Performed at [facility], [place] [address], [place], [place] [address]      Radiology Studies: MR ANGIO HEAD WO CONTRAST  Result Date: 12/13/2020 CLINICAL DATA:  Initial evaluation for acute TIA. EXAM: MRI HEAD WITHOUT AND WITH CONTRAST MRA HEAD WITHOUT CONTRAST MRA NECK WITHOUT AND WITH CONTRAST TECHNIQUE: Multiplanar, multiecho pulse sequences of the brain and surrounding structures were obtained without intravenous contrast. Angiographic images of the Circle of Willis were obtained using MRA technique without intravenous contrast. Angiographic images of the neck were obtained using MRA technique without and with intravenous contrast. Carotid stenosis measurements (when applicable) are obtained utilizing NASCET criteria, using the distal internal carotid diameter as the denominator. CONTRAST:  92mL GADAVIST GADOBUTROL 1 MMOL/ML IV SOLN COMPARISON:  Prior head CT from 12/13/2020. FINDINGS: MRI HEAD FINDINGS Brain: Generalized age-related cerebral atrophy. Patchy and confluent T2/FLAIR hyperintensity within the periventricular deep white matter both cerebral hemispheres most consistent with chronic small vessel  ischemic disease. Area of encephalomalacia and gliosis involving the cortical and subcortical aspect of the left frontal lobe consistent with a chronic ischemic infarct. Additional small remote cortical/subcortical infarcts segment at the left parietal lobe. Few small remote lacunar infarcts noted about the bilateral basal ganglia and cerebellum. Additional small remote lacunar infarct noted at the left frontal centrum semi ovale. No abnormal foci of restricted diffusion to suggest acute or subacute ischemia. Gray-white matter differentiation maintained. No evidence for acute intracranial hemorrhage. Few small chronic micro hemorrhages noted at the periventricular white matter of the posterior corona radiata, likely small vessel related. Previously noted sellar/suprasellar mass again seen, measuring 2.0 x 2.5 x 2.8 cm in greatest dimensions, most consistent with a pituitary macro adenoma. Suprasellar extension to involve the pituitary stalk. Tumor abuts the optic chiasm superiorly which is slightly bowed superiorly and deviated to the right. Evidence for extension into the left cavernous sinus with tumor nearly entirely compass seen the cavernous left ICA. Probable inferior erosion through the clivus with possible extension into the left sphenoid sinus (series 22, image 9). No other mass lesion, mass effect or midline shift. No other abnormal enhancement. No hydrocephalus or extra-axial fluid collection. Midline structures otherwise intact. Vascular: Major intracranial vascular flow voids are maintained. Skull and upper cervical spine: Craniocervical junction within normal limits. Bone marrow signal intensity normal. No acute scalp soft tissue abnormality. Small lipoma noted at the right frontal scalp. Sinuses/Orbits: Patient status post bilateral ocular lens replacement. Globes and orbital soft tissues demonstrate no acute finding. Scattered mucosal thickening noted throughout the paranasal sinuses. Mastoid air  cells are clear. Inner ear structures grossly normal. Other: None. MRA HEAD FINDINGS ANTERIOR CIRCULATION: Visualized distal cervical segments of the internal carotid arteries are widely patent with antegrade flow. Petrous, cavernous, and supraclinoid ICAs widely patent without stenosis. Note made of the pituitary macro adenoma partially encasing the cavernous/supraclinoid left ICA without associated luminal narrowing. Left A1 widely patent. Right A1 hypoplastic and/or absent, accounting for  the slightly diminutive right ICA is compared to the left. Normal anterior communicating artery complex. Anterior cerebral arteries patent to their distal aspects without stenosis. No M1 stenosis or occlusion. Negative MCA bifurcations. Distal MCA branches well perfused and symmetric. POSTERIOR CIRCULATION: Both V4 segments widely patent to the vertebrobasilar junction. Left V4 dominant. Both PICA origins patent and normal. Basilar widely patent to its distal aspect. Superior cerebral arteries patent bilaterally. Left PCA supplied via the basilar. Predominant fetal type origin of the right PCA. Both PCAs well perfused or distal aspects. No intracranial aneurysm. MRA NECK FINDINGS AORTIC ARCH: Visualized aortic arch of normal caliber. Bovine branching pattern noted. No hemodynamically significant stenosis seen about the origin of the great vessels. RIGHT CAROTID SYSTEM: Right CCA widely patent from its origin to the bifurcation. Short-segment atheromatous stenosis of up to 40% by NASCET criteria noted at the origin of the right ICA. Right ICA widely patent distally to the skull base without stenosis, evidence for dissection or occlusion. LEFT CAROTID SYSTEM: Left CCA widely patent from its origin to the bifurcation without stenosis. No significant atheromatous narrowing or irregularity about the left bifurcation. Left ICA widely patent distally without stenosis, evidence for dissection or occlusion. VERTEBRAL ARTERIES: Both  vertebral arteries arise from the subclavian arteries. No proximal subclavian artery stenosis. Left vertebral artery dominant. Probable moderate approximate 50% stenosis noted at the origin of the diminutive right vertebral artery. Vertebral arteries otherwise patent within the neck without stenosis, evidence for dissection or occlusion. Enlarged multinodular goiter. Dominant nodule measuring up to approximately 2 cm present at the upper pole of the right thyroid lobe (series 17, image 26). IMPRESSION: MRI HEAD IMPRESSION: 1. No acute intracranial abnormality. 2. Underlying age-related cerebral atrophy with mild chronic microvascular ischemic disease. Superimposed chronic ischemic infarcts involving the left frontal and parietal lobes, with additional scattered remote lacunar infarcts about the left centrum semi ovale, bilateral basal ganglia, and cerebellum. 3. 2.0 x 2.5 x 2.8 cm pituitary macro adenoma with evidence for invasion into the left cavernous sinus, with probable inferior erosion into the left sphenoid sinus. MRA HEAD IMPRESSION: Negative intracranial MRA for large vessel occlusion. No hemodynamically significant or correctable stenosis identified. MRA NECK IMPRESSION: 1. Short-segment 40% atheromatous stenosis at the origin of the right ICA. 2. Otherwise wide patency of both carotid artery systems within the neck. 3. Short-segment moderate approximate 50% stenosis at the origin of the right vertebral artery. Vertebral arteries otherwise patent within the neck. Left vertebral artery dominant. 4. Enlarged multinodular thyroid with dominant nodule measuring up to approximately 2 cm at the upper right thyroid lobe. Further evaluation with dedicated thyroid ultrasound recommended for further evaluation. This could be performed on a nonemergent outpatient basis. (Ref: J Am Coll Radiol. 2015 Feb;12(2): 143-50). Electronically Signed   By: Jeannine Boga M.D.   On: 12/13/2020 23:00   MR ANGIO NECK W WO  CONTRAST  Result Date: 12/13/2020 CLINICAL DATA:  Initial evaluation for acute TIA. EXAM: MRI HEAD WITHOUT AND WITH CONTRAST MRA HEAD WITHOUT CONTRAST MRA NECK WITHOUT AND WITH CONTRAST TECHNIQUE: Multiplanar, multiecho pulse sequences of the brain and surrounding structures were obtained without intravenous contrast. Angiographic images of the Circle of Willis were obtained using MRA technique without intravenous contrast. Angiographic images of the neck were obtained using MRA technique without and with intravenous contrast. Carotid stenosis measurements (when applicable) are obtained utilizing NASCET criteria, using the distal internal carotid diameter as the denominator. CONTRAST:  53mL GADAVIST GADOBUTROL 1 MMOL/ML IV SOLN COMPARISON:  Prior head CT from 12/13/2020. FINDINGS: MRI HEAD FINDINGS Brain: Generalized age-related cerebral atrophy. Patchy and confluent T2/FLAIR hyperintensity within the periventricular deep white matter both cerebral hemispheres most consistent with chronic small vessel ischemic disease. Area of encephalomalacia and gliosis involving the cortical and subcortical aspect of the left frontal lobe consistent with a chronic ischemic infarct. Additional small remote cortical/subcortical infarcts segment at the left parietal lobe. Few small remote lacunar infarcts noted about the bilateral basal ganglia and cerebellum. Additional small remote lacunar infarct noted at the left frontal centrum semi ovale. No abnormal foci of restricted diffusion to suggest acute or subacute ischemia. Gray-white matter differentiation maintained. No evidence for acute intracranial hemorrhage. Few small chronic micro hemorrhages noted at the periventricular white matter of the posterior corona radiata, likely small vessel related. Previously noted sellar/suprasellar mass again seen, measuring 2.0 x 2.5 x 2.8 cm in greatest dimensions, most consistent with a pituitary macro adenoma. Suprasellar extension to  involve the pituitary stalk. Tumor abuts the optic chiasm superiorly which is slightly bowed superiorly and deviated to the right. Evidence for extension into the left cavernous sinus with tumor nearly entirely compass seen the cavernous left ICA. Probable inferior erosion through the clivus with possible extension into the left sphenoid sinus (series 22, image 9). No other mass lesion, mass effect or midline shift. No other abnormal enhancement. No hydrocephalus or extra-axial fluid collection. Midline structures otherwise intact. Vascular: Major intracranial vascular flow voids are maintained. Skull and upper cervical spine: Craniocervical junction within normal limits. Bone marrow signal intensity normal. No acute scalp soft tissue abnormality. Small lipoma noted at the right frontal scalp. Sinuses/Orbits: Patient status post bilateral ocular lens replacement. Globes and orbital soft tissues demonstrate no acute finding. Scattered mucosal thickening noted throughout the paranasal sinuses. Mastoid air cells are clear. Inner ear structures grossly normal. Other: None. MRA HEAD FINDINGS ANTERIOR CIRCULATION: Visualized distal cervical segments of the internal carotid arteries are widely patent with antegrade flow. Petrous, cavernous, and supraclinoid ICAs widely patent without stenosis. Note made of the pituitary macro adenoma partially encasing the cavernous/supraclinoid left ICA without associated luminal narrowing. Left A1 widely patent. Right A1 hypoplastic and/or absent, accounting for the slightly diminutive right ICA is compared to the left. Normal anterior communicating artery complex. Anterior cerebral arteries patent to their distal aspects without stenosis. No M1 stenosis or occlusion. Negative MCA bifurcations. Distal MCA branches well perfused and symmetric. POSTERIOR CIRCULATION: Both V4 segments widely patent to the vertebrobasilar junction. Left V4 dominant. Both PICA origins patent and normal.  Basilar widely patent to its distal aspect. Superior cerebral arteries patent bilaterally. Left PCA supplied via the basilar. Predominant fetal type origin of the right PCA. Both PCAs well perfused or distal aspects. No intracranial aneurysm. MRA NECK FINDINGS AORTIC ARCH: Visualized aortic arch of normal caliber. Bovine branching pattern noted. No hemodynamically significant stenosis seen about the origin of the great vessels. RIGHT CAROTID SYSTEM: Right CCA widely patent from its origin to the bifurcation. Short-segment atheromatous stenosis of up to 40% by NASCET criteria noted at the origin of the right ICA. Right ICA widely patent distally to the skull base without stenosis, evidence for dissection or occlusion. LEFT CAROTID SYSTEM: Left CCA widely patent from its origin to the bifurcation without stenosis. No significant atheromatous narrowing or irregularity about the left bifurcation. Left ICA widely patent distally without stenosis, evidence for dissection or occlusion. VERTEBRAL ARTERIES: Both vertebral arteries arise from the subclavian arteries. No proximal subclavian artery stenosis. Left vertebral artery  dominant. Probable moderate approximate 50% stenosis noted at the origin of the diminutive right vertebral artery. Vertebral arteries otherwise patent within the neck without stenosis, evidence for dissection or occlusion. Enlarged multinodular goiter. Dominant nodule measuring up to approximately 2 cm present at the upper pole of the right thyroid lobe (series 17, image 26). IMPRESSION: MRI HEAD IMPRESSION: 1. No acute intracranial abnormality. 2. Underlying age-related cerebral atrophy with mild chronic microvascular ischemic disease. Superimposed chronic ischemic infarcts involving the left frontal and parietal lobes, with additional scattered remote lacunar infarcts about the left centrum semi ovale, bilateral basal ganglia, and cerebellum. 3. 2.0 x 2.5 x 2.8 cm pituitary macro adenoma with  evidence for invasion into the left cavernous sinus, with probable inferior erosion into the left sphenoid sinus. MRA HEAD IMPRESSION: Negative intracranial MRA for large vessel occlusion. No hemodynamically significant or correctable stenosis identified. MRA NECK IMPRESSION: 1. Short-segment 40% atheromatous stenosis at the origin of the right ICA. 2. Otherwise wide patency of both carotid artery systems within the neck. 3. Short-segment moderate approximate 50% stenosis at the origin of the right vertebral artery. Vertebral arteries otherwise patent within the neck. Left vertebral artery dominant. 4. Enlarged multinodular thyroid with dominant nodule measuring up to approximately 2 cm at the upper right thyroid lobe. Further evaluation with dedicated thyroid ultrasound recommended for further evaluation. This could be performed on a nonemergent outpatient basis. (Ref: J Am Coll Radiol. 2015 Feb;12(2): 143-50). Electronically Signed   By: Jeannine Boga M.D.   On: 12/13/2020 23:00   MR BRAIN W WO CONTRAST  Result Date: 12/13/2020 CLINICAL DATA:  Initial evaluation for acute TIA. EXAM: MRI HEAD WITHOUT AND WITH CONTRAST MRA HEAD WITHOUT CONTRAST MRA NECK WITHOUT AND WITH CONTRAST TECHNIQUE: Multiplanar, multiecho pulse sequences of the brain and surrounding structures were obtained without intravenous contrast. Angiographic images of the Circle of Willis were obtained using MRA technique without intravenous contrast. Angiographic images of the neck were obtained using MRA technique without and with intravenous contrast. Carotid stenosis measurements (when applicable) are obtained utilizing NASCET criteria, using the distal internal carotid diameter as the denominator. CONTRAST:  80mL GADAVIST GADOBUTROL 1 MMOL/ML IV SOLN COMPARISON:  Prior head CT from 12/13/2020. FINDINGS: MRI HEAD FINDINGS Brain: Generalized age-related cerebral atrophy. Patchy and confluent T2/FLAIR hyperintensity within the  periventricular deep white matter both cerebral hemispheres most consistent with chronic small vessel ischemic disease. Area of encephalomalacia and gliosis involving the cortical and subcortical aspect of the left frontal lobe consistent with a chronic ischemic infarct. Additional small remote cortical/subcortical infarcts segment at the left parietal lobe. Few small remote lacunar infarcts noted about the bilateral basal ganglia and cerebellum. Additional small remote lacunar infarct noted at the left frontal centrum semi ovale. No abnormal foci of restricted diffusion to suggest acute or subacute ischemia. Gray-white matter differentiation maintained. No evidence for acute intracranial hemorrhage. Few small chronic micro hemorrhages noted at the periventricular white matter of the posterior corona radiata, likely small vessel related. Previously noted sellar/suprasellar mass again seen, measuring 2.0 x 2.5 x 2.8 cm in greatest dimensions, most consistent with a pituitary macro adenoma. Suprasellar extension to involve the pituitary stalk. Tumor abuts the optic chiasm superiorly which is slightly bowed superiorly and deviated to the right. Evidence for extension into the left cavernous sinus with tumor nearly entirely compass seen the cavernous left ICA. Probable inferior erosion through the clivus with possible extension into the left sphenoid sinus (series 22, image 9). No other mass lesion, mass effect or  midline shift. No other abnormal enhancement. No hydrocephalus or extra-axial fluid collection. Midline structures otherwise intact. Vascular: Major intracranial vascular flow voids are maintained. Skull and upper cervical spine: Craniocervical junction within normal limits. Bone marrow signal intensity normal. No acute scalp soft tissue abnormality. Small lipoma noted at the right frontal scalp. Sinuses/Orbits: Patient status post bilateral ocular lens replacement. Globes and orbital soft tissues demonstrate  no acute finding. Scattered mucosal thickening noted throughout the paranasal sinuses. Mastoid air cells are clear. Inner ear structures grossly normal. Other: None. MRA HEAD FINDINGS ANTERIOR CIRCULATION: Visualized distal cervical segments of the internal carotid arteries are widely patent with antegrade flow. Petrous, cavernous, and supraclinoid ICAs widely patent without stenosis. Note made of the pituitary macro adenoma partially encasing the cavernous/supraclinoid left ICA without associated luminal narrowing. Left A1 widely patent. Right A1 hypoplastic and/or absent, accounting for the slightly diminutive right ICA is compared to the left. Normal anterior communicating artery complex. Anterior cerebral arteries patent to their distal aspects without stenosis. No M1 stenosis or occlusion. Negative MCA bifurcations. Distal MCA branches well perfused and symmetric. POSTERIOR CIRCULATION: Both V4 segments widely patent to the vertebrobasilar junction. Left V4 dominant. Both PICA origins patent and normal. Basilar widely patent to its distal aspect. Superior cerebral arteries patent bilaterally. Left PCA supplied via the basilar. Predominant fetal type origin of the right PCA. Both PCAs well perfused or distal aspects. No intracranial aneurysm. MRA NECK FINDINGS AORTIC ARCH: Visualized aortic arch of normal caliber. Bovine branching pattern noted. No hemodynamically significant stenosis seen about the origin of the great vessels. RIGHT CAROTID SYSTEM: Right CCA widely patent from its origin to the bifurcation. Short-segment atheromatous stenosis of up to 40% by NASCET criteria noted at the origin of the right ICA. Right ICA widely patent distally to the skull base without stenosis, evidence for dissection or occlusion. LEFT CAROTID SYSTEM: Left CCA widely patent from its origin to the bifurcation without stenosis. No significant atheromatous narrowing or irregularity about the left bifurcation. Left ICA widely  patent distally without stenosis, evidence for dissection or occlusion. VERTEBRAL ARTERIES: Both vertebral arteries arise from the subclavian arteries. No proximal subclavian artery stenosis. Left vertebral artery dominant. Probable moderate approximate 50% stenosis noted at the origin of the diminutive right vertebral artery. Vertebral arteries otherwise patent within the neck without stenosis, evidence for dissection or occlusion. Enlarged multinodular goiter. Dominant nodule measuring up to approximately 2 cm present at the upper pole of the right thyroid lobe (series 17, image 26). IMPRESSION: MRI HEAD IMPRESSION: 1. No acute intracranial abnormality. 2. Underlying age-related cerebral atrophy with mild chronic microvascular ischemic disease. Superimposed chronic ischemic infarcts involving the left frontal and parietal lobes, with additional scattered remote lacunar infarcts about the left centrum semi ovale, bilateral basal ganglia, and cerebellum. 3. 2.0 x 2.5 x 2.8 cm pituitary macro adenoma with evidence for invasion into the left cavernous sinus, with probable inferior erosion into the left sphenoid sinus. MRA HEAD IMPRESSION: Negative intracranial MRA for large vessel occlusion. No hemodynamically significant or correctable stenosis identified. MRA NECK IMPRESSION: 1. Short-segment 40% atheromatous stenosis at the origin of the right ICA. 2. Otherwise wide patency of both carotid artery systems within the neck. 3. Short-segment moderate approximate 50% stenosis at the origin of the right vertebral artery. Vertebral arteries otherwise patent within the neck. Left vertebral artery dominant. 4. Enlarged multinodular thyroid with dominant nodule measuring up to approximately 2 cm at the upper right thyroid lobe. Further evaluation with dedicated thyroid ultrasound recommended  for further evaluation. This could be performed on a nonemergent outpatient basis. (Ref: J Am Coll Radiol. 2015 Feb;12(2): 143-50).  Electronically Signed   By: Jeannine Boga M.D.   On: 12/13/2020 23:00   DG Chest Portable 1 View  Result Date: 12/13/2020 CLINICAL DATA:  Code stroke.  Syncopal episode. EXAM: PORTABLE CHEST 1 VIEW COMPARISON:  None. FINDINGS: Patient is rotated to the right. Monitoring leads overlie the patient. Cardiac contours normal in size. Low lung volumes. Bibasilar heterogeneous opacities. Nodular opacity projects over the right upper hemithorax. No pleural effusion or pneumothorax. Thoracic spine degenerative changes. IMPRESSION: Nodular opacity projecting over the right upper hemithorax likely represents the first rib given the patient rotation. Recommend dedicated evaluation with repeat PA and lateral chest radiograph to exclude the possibility of pulmonary nodule. Heterogeneous opacities left greater than right lung bases favored to represent atelectasis. Infection not excluded. Electronically Signed   By: Lovey Newcomer M.D.   On: 12/13/2020 17:41   EEG adult  Result Date: 12/14/2020 Lora Havens, MD     12/14/2020  2:18 PM Patient Name: Lenzi Marmo MRN: [id number] Epilepsy Attending: Lora Havens Referring Physician/Provider: Laurey Morale, NP Date:  12/14/2020 Duration: 25.01 mins Patient history: 86 y.o. female with history of HTN and DM presenting with syncope. EEG to evaluate for seizure Level of alertness: Awake AEDs during EEG study: None Technical aspects: This EEG study was done with scalp electrodes positioned according to the 10-20 International system of electrode placement. Electrical activity was acquired at a sampling rate of 500Hz  and reviewed with a high frequency filter of 70Hz  and a low frequency filter of 1Hz . EEG data were recorded continuously and digitally stored. Description: The posterior dominant rhythm consists of 8 Hz activity of moderate voltage (25-35 uV) seen predominantly in posterior head regions, symmetric and reactive to eye opening and eye closing.   EEG showed intermittent left temporal 2-3Hz  delta slowing. Hyperventilation and photic stimulation were not performed.   ABNORMALITY -Intermittent slow, left temporal region IMPRESSION: This study is suggestive of nonspecific cortical dysfunction in left temporal lobe. No seizures or epileptiform discharges were seen throughout the recording. Lora Havens   ECHOCARDIOGRAM COMPLETE  Result Date: 12/14/2020    ECHOCARDIOGRAM REPORT   Patient Name:   Alissandra SHIVER [place] Date of Exam: 12/14/2020 Medical Rec #:  [id number]           Height:       66.0 in Accession #:    [id number]          Weight:       140.4 lb Date of Birth:  1933-08-17           BSA:          1.721 m Patient Age:    35 years            BP:           141/71 mmHg Patient Gender: F                   HR:           71 bpm. Exam Location:  Inpatient Procedure: 2D Echo, Cardiac Doppler and Color Doppler Indications:    TIA 435.9 G45.9  History:        Patient has no prior history of Echocardiogram examinations.                 Hypertrophic Cardiomyopathy, Aortic Valve Disease; Risk  Factors:Hypertension and Diabetes. 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                 Aortic Valve: 21 mm Carpentier bioprosthetic valve is present in                 the aortic position.  Sonographer:    Tiffany Dance Referring Phys: [id number] Orlean Bradford [place]  1. Left ventricular ejection fraction, by estimation, is 50 to 55%. The left ventricle has low normal function. The left ventricle has no regional wall motion abnormalities. There is mild left ventricular hypertrophy. Left ventricular diastolic parameters are consistent with Grade I diastolic dysfunction (impaired relaxation). Elevated left ventricular end-diastolic pressure. There is incoordinate septal motion.  2. Right ventricular systolic function is mildly reduced. The right ventricular size is normal.  3. The mitral valve  is abnormal. Trivial mitral valve regurgitation. Moderate mitral annular calcification.  4. The aortic valve is a Carpentier-Edwards 21 mm bioprosthetic. Aortic valve regurgitation is not visualized. Aortic valve area, by VTI measures 1.09 cm. Aortic valve mean gradient measures 19.0 mmHg. Aortic valve Vmax measures 2.82 m/s. Peak gradient  32 mmHg. DI is 0.38. Findings are likely normal given the size/position of this valve.  5. The inferior vena cava is normal in size with greater than 50% respiratory variability, suggesting right atrial pressure of 3 mmHg. Comparison(s): Prior images unable to be directly viewed, comparison made by report only. Changes from prior study are noted. 07/13/18: An echo was last performed at [facility] in 2019. LVEF was reportedly 55%. bioprosthetic AVR mean gradient of 16 mmHg. FINDINGS  Left Ventricle: Left ventricular ejection fraction, by estimation, is 50 to 55%. The left ventricle has low normal function. The left ventricle has no regional wall motion abnormalities. The left ventricular internal cavity size was normal in size. There is mild left ventricular hypertrophy. Incoordinate septal motion. Left ventricular diastolic parameters are consistent with Grade I diastolic dysfunction (impaired relaxation). Elevated left ventricular end-diastolic pressure. The E/e' is 20. Right Ventricle: The right ventricular size is normal. No increase in right ventricular wall thickness. Right ventricular systolic function is mildly reduced. Left Atrium: Left atrial size was normal in size. Right Atrium: Right atrial size was normal in size. Pericardium: There is no evidence of pericardial effusion. Mitral Valve: The mitral valve is abnormal. There is mild calcification of the posterior mitral valve leaflet(s). Moderate mitral annular calcification. Trivial mitral valve regurgitation. Tricuspid Valve: The tricuspid valve is grossly normal. Tricuspid valve regurgitation is trivial. Aortic  Valve: The aortic valve has been repaired/replaced. Aortic valve regurgitation is not visualized. Moderate aortic stenosis is present. Aortic valve mean gradient measures 19.0 mmHg. Aortic valve peak gradient measures 31.8 mmHg. Aortic valve area,  by VTI measures 1.09 cm. There is a 21 mm Carpentier bioprosthetic valve present in the aortic position. Echo findings are consistent with normal structure and function of the aortic valve prosthesis. Pulmonic Valve: The pulmonic valve was normal in structure. Pulmonic valve regurgitation is not visualized. Aorta: The aortic root and ascending aorta are structurally normal, with no evidence of dilitation. Venous: The inferior vena cava is normal in size with greater than 50% respiratory variability, suggesting right atrial pressure of 3 mmHg. IAS/Shunts: No atrial level shunt detected by color flow Doppler.  LEFT VENTRICLE PLAX 2D LVIDd:         3.30 cm  Diastology LVIDs:         2.40 cm  LV e' medial:    4.35 cm/s LV PW:         0.90 cm  LV E/e' medial:  20.4 LV IVS:        1.20 cm  LV e' lateral:   4.23 cm/s LVOT diam:     1.90 cm  LV E/e' lateral: 20.9 LV SV:         61 LV SV Index:   35 LVOT Area:     2.84 cm  RIGHT VENTRICLE            IVC RV Basal diam:  2.30 cm    IVC diam: 1.30 cm RV S prime:     7.09 cm/s TAPSE (M-mode): 1.6 cm LEFT ATRIUM             Index       RIGHT ATRIUM          Index LA diam:        2.80 cm 1.63 cm/m  RA Area:     7.70 cm LA Vol (A2C):   55.7 ml 32.37 ml/m RA Volume:   9.62 ml  5.59 ml/m LA Vol (A4C):   51.7 ml 30.04 ml/m LA Biplane Vol: 57.8 ml 33.59 ml/m  AORTIC VALVE AV Area (Vmax):    1.04 cm AV Area (Vmean):   0.99 cm AV Area (VTI):     1.09 cm AV Vmax:           282.00 cm/s AV Vmean:          203.000 cm/s AV VTI:            0.558 m AV Peak Grad:      31.8 mmHg AV Mean Grad:      19.0 mmHg LVOT Vmax:         103.00 cm/s LVOT Vmean:        71.200 cm/s LVOT VTI:          0.214 m LVOT/AV VTI ratio: 0.38  AORTA Ao Root diam:  3.30 cm Ao Asc diam:  3.50 cm MITRAL VALVE MV Area (PHT): 3.37 cm     SHUNTS MV Decel Time: 225 msec     Systemic VTI:  0.21 m MV E velocity: 88.60 cm/s   Systemic Diam: 1.90 cm MV A velocity: 150.00 cm/s MV E/A ratio:  0.59 Lyman Bishop MD Electronically signed by Lyman Bishop MD Signature Date/Time: 12/14/2020/1:51:00 PM    Final    CT HEAD CODE STROKE WO CONTRAST  Result Date: 12/13/2020 CLINICAL DATA:  Code stroke. Right sided deficits. Acute stroke presentation. EXAM: CT HEAD WITHOUT CONTRAST TECHNIQUE: Contiguous axial images were obtained from the base of the skull through the vertex without intravenous contrast. COMPARISON:  None. FINDINGS: Brain: Age related atrophy. No focal abnormality affects the brainstem or cerebellum. Cerebral hemispheres show chronic small-vessel ischemic changes of the white matter. There is an old left posterior frontal vertex cortical infarction. No sign of acute infarction, intra-axial mass lesion, hemorrhage, hydrocephalus or extra-axial collection. The patient has a sellar mass with suprasellar extension most likely to represent a pituitary adenoma. Vascular: There is atherosclerotic calcification of the major vessels at the base of the brain. Skull: Negative Sinuses/Orbits: Clear except for chronic inflammatory changes of the left division of the sphenoid sinus. Orbits negative. Other: None ASPECTS Waynette Buttery  Stroke Program Early CT Score) - Ganglionic level infarction (caudate, lentiform nuclei, internal capsule, insula, M1-M3 cortex): 7 - Supraganglionic infarction (M4-M6 cortex): 3 Total score (0-10 with 10 being normal): 10 IMPRESSION: 1. No acute finding by CT. Atrophy and chronic small-vessel ischemic changes. 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 2. Sellar mass with suprasellar extension most likely to represent a pituitary adenoma. 3. ASPECTS is 10. 4. These results were communicated to Dr. Theda Sers At 4:44 pmon 12/19/2021by text page via the  [facility] messaging system. Electronically Signed   By: Nelson Chimes M.D.   On: 12/13/2020 16:45   MR ANGIO HEAD WO CONTRAST  Final Result    MR BRAIN W WO CONTRAST  Final Result    MR ANGIO NECK W WO CONTRAST  Final Result    DG Chest Portable 1 View  Final Result    CT HEAD CODE STROKE WO CONTRAST  Final Result      Scheduled Meds: . aspirin  81 mg Oral Daily  . atorvastatin  20 mg Oral Daily  . clopidogrel  75 mg Oral Daily  . enoxaparin (LOVENOX) injection  40 mg Subcutaneous Q24H  . insulin aspart  0-5 Units Subcutaneous QHS  . insulin aspart  0-9 Units Subcutaneous TID WC   PRN Meds: acetaminophen **OR** acetaminophen (TYLENOL) oral liquid 160 mg/5 mL **OR** acetaminophen, senna-docusate Continuous Infusions: . sodium chloride 75 mL/hr at 12/13/20 2210     LOS: 1 day  Time spent: Greater than 50% of the 35 minute visit was spent in counseling/coordination of care for the patient as laid out in the A&P.   Dwyane Dee, MD [facility] 12/14/2020, 7:21 PM

## 2020-12-14 NOTE — ED Notes (Signed)
Pt cleaned of urinary and feces incontinence. Old Purewick removed and discarded. Daughter remains at patient's bedside.

## 2020-12-14 NOTE — Progress Notes (Signed)
  Echocardiogram 2D Echocardiogram has been performed.  Christine Newton G Christine Newton 12/14/2020, 11:30 AM

## 2020-12-14 NOTE — ED Notes (Signed)
Lunch tray delivered.

## 2020-12-14 NOTE — Care Management CC44 (Signed)
Condition Code 44 Documentation Completed  Patient Details  Name: Malori Myers MRN: [id number] Date of Birth: 08-15-1933   Condition Code 44 given:  Yes Patient signature on Condition Code 44 notice:  Yes Documentation of 2 MD's agreement:  Yes Code 44 added to claim:  Yes    Fuller Mandril, RN 12/14/2020, 2:33 PM

## 2020-12-15 DIAGNOSIS — G459 Transient cerebral ischemic attack, unspecified: Secondary | ICD-10-CM | POA: Diagnosis not present

## 2020-12-15 DIAGNOSIS — I10 Essential (primary) hypertension: Secondary | ICD-10-CM | POA: Diagnosis not present

## 2020-12-15 DIAGNOSIS — R55 Syncope and collapse: Secondary | ICD-10-CM

## 2020-12-15 LAB — GLUCOSE, CAPILLARY
Glucose-Capillary: 167 mg/dL — ABNORMAL HIGH (ref 70–99)
Glucose-Capillary: 158 mg/dL — ABNORMAL HIGH (ref 70–99)

## 2020-12-15 LAB — CBC WITH DIFFERENTIAL/PLATELET
Abs Immature Granulocytes: 0.53 10*3/uL — ABNORMAL HIGH (ref 0.00–0.07)
Basophils Absolute: 0 10*3/uL (ref 0.0–0.1)
Basophils Relative: 0 %
Eosinophils Absolute: 0 10*3/uL (ref 0.0–0.5)
Eosinophils Relative: 0 %
HCT: 39.1 % (ref 36.0–46.0)
Hemoglobin: 12.3 g/dL (ref 12.0–15.0)
Immature Granulocytes: 3 %
Lymphocytes Relative: 9 %
Lymphs Abs: 1.3 10*3/uL (ref 0.7–4.0)
MCH: 29.3 pg (ref 26.0–34.0)
MCHC: 31.5 g/dL (ref 30.0–36.0)
MCV: 93.1 fL (ref 80.0–100.0)
Monocytes Absolute: 0.6 10*3/uL (ref 0.1–1.0)
Monocytes Relative: 4 %
Neutro Abs: 12.9 10*3/uL — ABNORMAL HIGH (ref 1.7–7.7)
Neutrophils Relative %: 84 %
Platelets: 204 10*3/uL (ref 150–400)
RBC: 4.2 MIL/uL (ref 3.87–5.11)
RDW: 15.2 % (ref 11.5–15.5)
WBC: 15.4 10*3/uL — ABNORMAL HIGH (ref 4.0–10.5)
nRBC: 0 % (ref 0.0–0.2)

## 2020-12-15 LAB — BASIC METABOLIC PANEL
Anion gap: 12 (ref 5–15)
BUN: 30 mg/dL — ABNORMAL HIGH (ref 8–23)
CO2: 20 mmol/L — ABNORMAL LOW (ref 22–32)
Calcium: 8.3 mg/dL — ABNORMAL LOW (ref 8.9–10.3)
Chloride: 113 mmol/L — ABNORMAL HIGH (ref 98–111)
Creatinine, Ser: 0.94 mg/dL (ref 0.44–1.00)
GFR, Estimated: 59 mL/min — ABNORMAL LOW (ref >60)
Glucose, Bld: 142 mg/dL — ABNORMAL HIGH (ref 70–99)
Potassium: 3.3 mmol/L — ABNORMAL LOW (ref 3.5–5.1)
Sodium: 145 mmol/L (ref 135–145)

## 2020-12-15 LAB — MAGNESIUM: Magnesium: 2.1 mg/dL (ref 1.7–2.4)

## 2020-12-15 MED ORDER — POTASSIUM CHLORIDE CRYS ER 20 MEQ PO TBCR
40.0000 meq | EXTENDED_RELEASE_TABLET | Freq: Once | ORAL | Status: AC
  Administered 2020-12-15: 10:00:00 40 meq via ORAL
  Filled 2020-12-15: qty 2

## 2020-12-15 MED ORDER — ATORVASTATIN CALCIUM 20 MG PO TABS: 20.0000 mg | ORAL_TABLET | Freq: Every day | ORAL | 3 refills | Status: DC

## 2020-12-15 MED ORDER — LEVETIRACETAM ER 500 MG PO TB24: 500.0000 mg | ORAL_TABLET | Freq: Every day | ORAL | 3 refills | Status: DC

## 2020-12-15 MED ORDER — CLOPIDOGREL BISULFATE 75 MG PO TABS: 75.0000 mg | ORAL_TABLET | Freq: Every day | ORAL | 3 refills | Status: DC

## 2020-12-15 NOTE — Plan of Care (Signed)
  Problem: Education: Goal: Knowledge of disease or condition will improve Outcome: Progressing Goal: Knowledge of secondary prevention will improve Outcome: Progressing Goal: Knowledge of patient specific risk factors addressed and post discharge goals established will improve Outcome: Progressing   Problem: Ischemic Stroke/TIA Tissue Perfusion: Goal: Complications of ischemic stroke/TIA will be minimized Outcome: Progressing

## 2020-12-15 NOTE — Progress Notes (Signed)
Pt's daughter at bedside. States that she would like to use her home medications that she has. Home medication given to [place] to send down.

## 2020-12-15 NOTE — Discharge Instructions (Signed)
Goal systolic blood pressure about 120-140.  Increase lisinopril slowly up to max dose of 40 mg daily if needed if blood pressure still greater than 644-034 for systolic pressure  Follow up about the pituitary macroadenoma and thyroid nodule and ask for endocrinology referral.

## 2020-12-15 NOTE — TOC Initial Note (Signed)
Transition of Care [facility]) - Initial/Assessment Note    Patient Details  Name: Christine Newton MRN: [id number] Date of Birth: 1933-02-22  Transition of Care [facility]) CM/SW Contact:    Pollie Friar, RN Phone Number: 12/15/2020, 11:26 AM  Clinical Narrative:                 Pt lives home with daughter that can provide 24 hour supervision over the next 2 weeks. Pt has all needed DME.  Recommendations for [facility] services. Daughter selected [facility]. Cory with [place] accepted. Daughter asked that therapies not start until after the Christmas holiday. Cory updated.  Pt has transport home.  Expected Discharge Plan: [place] Barriers to Discharge: Continued Medical Work up   Patient Goals and CMS Newton   CMS Medicare.gov Compare Post Acute Care list provided to:: Patient Represenative (must comment) Newton offered to / list presented to : Adult Children,Patient  Expected Discharge Plan and Services Expected Discharge Plan: [place]   Discharge Planning Services: CM Consult Post Acute Care [place]: [place] arrangements for the past 2 months: [place]: PT [place]: [place] Date [place]: 12/15/20   Representative spoke with at [place]: Tommi Rumps  Prior Living Arrangements/Services Living arrangements for the past 2 months: [place] Lives with:: Adult Children Patient language and need for interpreter reviewed:: Yes Do you feel safe going back to the place where you live?: Yes      Need for Family Participation in Patient Care: Yes (Comment) Care giver support system in place?: Yes (comment) Current home services: DME (Shower seat/ hospital bed/ 3 in 1/ toilet riser/ walker/ wheelchair/ cane/ 4 prong cane) Criminal Activity/Legal Involvement Pertinent to Current Situation/Hospitalization: No - Comment as needed  Activities of Daily Living Home  Assistive Devices/Equipment: Environmental consultant (specify type) (front wheel walker) ADL Screening (condition at time of admission) Patient's cognitive ability adequate to safely complete daily activities?: Yes Is the patient deaf or have difficulty hearing?: No Does the patient have difficulty seeing, even when wearing glasses/contacts?: No Does the patient have difficulty concentrating, remembering, or making decisions?: Yes Patient able to express need for assistance with ADLs?: Yes Does the patient have difficulty dressing or bathing?: No Independently performs ADLs?: No Does the patient have difficulty walking or climbing stairs?: Yes Weakness of Legs: None Weakness of Arms/Hands: None  Permission Sought/Granted                  Emotional Assessment Appearance:: Appears stated age Attitude/Demeanor/Rapport: Engaged Affect (typically observed): Accepting Orientation: : Oriented to Self,Oriented to Place,Oriented to  Time,Oriented to Situation   Psych Involvement: No (comment)  Admission diagnosis:  TIA (transient ischemic attack) [G45.9] Near syncope [R55] Patient Active Problem List   Diagnosis Date Noted  . Thyroid nodule 12/14/2020  . TIA (transient ischemic attack) 12/13/2020  . Nonrheumatic aortic (valve) stenosis 09/21/2020  . Obesity, diabetes, and hypertension syndrome ([place]) 09/17/2020  . BMI 24.0-24.9, adult 09/17/2020  . S/P AVR (aortic valve replacement) 07/13/2018  . Hyperlipidemia 12/20/2013  . Adenoma of pituitary ([place]) 11/04/2013  . Essential hypertension 11/04/2013  . History of colon cancer 11/04/2013   PCP:  Lillard Anes, MD Pharmacy:   [id number] - [place], [address]  [address] [place] [address] Phone: [phone] Fax: [phone]     Social Determinants of Health (SDOH) Interventions    Readmission Risk Interventions No flowsheet data found.

## 2020-12-15 NOTE — Evaluation (Signed)
Speech Language Pathology Evaluation Patient Details Name: Christine Newton MRN: [id number] DOB: [birth year] Today's Date: 12/15/2020 Time: 1572-6203 SLP Time Calculation (min) (ACUTE ONLY): 14 min  Problem List:  Patient Active Problem List   Diagnosis Date Noted  . Thyroid nodule 12/14/2020  . TIA (transient ischemic attack) 12/13/2020  . Nonrheumatic aortic (valve) stenosis 09/21/2020  . Obesity, diabetes, and hypertension syndrome ([place]) 09/17/2020  . BMI 24.0-24.9, adult 09/17/2020  . S/P AVR (aortic valve replacement) 07/13/2018  . Hyperlipidemia 12/20/2013  . Adenoma of pituitary ([place]) 11/04/2013  . Essential hypertension 11/04/2013  . History of colon cancer 11/04/2013   Past Medical History:  Past Medical History:  Diagnosis Date  . Diabetes ([place])   . Hypertension   . Nonrheumatic aortic (valve) stenosis 09/21/2020   Past Surgical History:  Past Surgical History:  Procedure Laterality Date  . CARDIAC VALVE REPLACEMENT    . CHOLECYSTECTOMY     HPI:  Pt is an 86 y/o female admitted secondary to AMS. MRI negative for acute infarct but did show pituitary mass. PMH includes HTN, DM, and dementia.   Assessment / Plan / Recommendation Clinical Impression   Pt presents at baseline level of cognitive-linguistic functioning.  Pt's speech is fluent and free from dysarthria.  Her word finding is age appropriate.  Cognition was deemed to be back to baseline per pt and daughter following administration of subtests of [place] Mental State Exam.  As a result, no further ST needs are indicated at this time.  Pt and daughter in agreement.      SLP Assessment  SLP Recommendation/Assessment: Patient does not need any further Speech Lanaguage Pathology Services    Follow Up Recommendations  None          SLP Evaluation Cognition  Overall Cognitive Status: History of cognitive impairments - at baseline Arousal/Alertness: Awake/alert Orientation Level: Oriented to  person Memory: Impaired Memory Impairment: Decreased recall of new information;Retrieval deficit Awareness: Appears intact Problem Solving: Appears intact Safety/Judgment: Appears intact       Comprehension  Auditory Comprehension Overall Auditory Comprehension: Appears within functional limits for tasks assessed    Expression Expression Primary Mode of Expression: Verbal Verbal Expression Overall Verbal Expression: Appears within functional limits for tasks assessed   Oral / Motor  Oral Motor/Sensory Function Overall Oral Motor/Sensory Function: Within functional limits Motor Speech Overall Motor Speech: Appears within functional limits for tasks assessed   GO                    Emilio Math 12/15/2020, 9:43 AM

## 2020-12-15 NOTE — Progress Notes (Signed)
STROKE TEAM PROGRESS NOTE   INTERVAL HISTORY Patient is doing well.  Her daughter is at the bedside.  She appears to recovered to her baseline.  EEG shows 2 to 3 Hz left temporal focal slowing echocardiogram shows ejection fraction 50 to 55%.  Bicuspid aortic valve appears to be functioning well.  Vitals:   12/14/20 2351 12/15/20 0356 12/15/20 0743 12/15/20 1204  BP: 131/82 134/83 (!) 143/71 (!) 160/86  Pulse: 79 80 75 73  Resp: 17 16 20 18   Temp: 98 F (36.7 C) 97.9 F (36.6 C) 97.7 F (36.5 C) 97.8 F (36.6 C)  TempSrc: Oral Oral Oral Oral  SpO2: 100% 98% 100% 100%  Weight:      Height:       CBC:  Recent Labs  Lab 12/14/20 0431 12/15/20 0225  WBC 13.6* 15.4*  NEUTROABS 10.7* 12.9*  HGB 13.8 12.3  HCT 44.4 39.1  MCV 96.1 93.1  PLT 195 0000000   Basic Metabolic Panel:  Recent Labs  Lab 12/14/20 0431 12/15/20 0225  NA 147* 145  K 3.9 3.3*  CL 113* 113*  CO2 22 20*  GLUCOSE 152* 142*  BUN 31* 30*  CREATININE 0.86 0.94  CALCIUM 8.6* 8.3*  MG 2.2 2.1  PHOS 4.4  --    Lipid Panel:  Recent Labs  Lab 12/14/20 0431  CHOL 186  TRIG 68  HDL 45  CHOLHDL 4.1  VLDL 14  LDLCALC 127*   HgbA1c:  Recent Labs  Lab 12/14/20 0431  HGBA1C 6.8*    IMAGING past 24 hours No results found.  PHYSICAL EXAM Pleasant elderly Caucasian lady not in distress.  She is hard of hearing. . Afebrile. Head is nontraumatic. Neck is supple without bruit.    Cardiac exam no murmur or gallop. Lungs are clear to auscultation. Distal pulses are well felt. Neurological Exam ;  Awake  Alert oriented x 1.  Diminished attention, registration and recall.    Normal speech and language.eye movements full without nystagmus.fundi were not visualized. Vision acuity and fields appear normal. Hearing is diminished bilaterally.. Palatal movements are normal. Face symmetric. Tongue midline. Normal strength, tone, reflexes and coordination. Normal sensation. Gait deferred.  ASSESSMENT/PLAN Ms. Hevin  Shiver Vanwagner is a 86 y.o. female with history of HTN and DM presenting with syncope.    Possible left brain TIA in the setting of syncopal event:  Code Stroke CT head 1. No acute finding by CT. Atrophy and chronic small-vessel ischemicchanges. 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 Sellar mass with suprasellar extension most likely to represent a pituitary adenoma.3. ASPECTS is 10.  MRI /MRA head and neck : MRI HEAD IMPRESSION: 1. No acute intracranial abnormality. 2. Underlying age-related cerebral atrophy with mild chronic microvascular ischemic disease. Superimposed chronic ischemic infarcts involving the left frontal and parietal lobes, with additional scattered remote lacunar infarcts about the left centrum semi ovale, bilateral basal ganglia, and cerebellum. 3. 2.0 x 2.5 x 2.8 cm pituitary macro adenoma with evidence for invasion into the left cavernous sinus, with probable inferior erosion into the left sphenoid sinus.  MRA HEAD IMPRESSION:  Negative intracranial MRA for large vessel occlusion. No hemodynamically significant or correctable stenosis identified.  MRA NECK IMPRESSION:  1. Short-segment 40% atheromatous stenosis at the origin of the right ICA. 2. Otherwise wide patency of both carotid artery systems within the neck. 3. Short-segment moderate approximate 50% stenosis at the origin of the right vertebral artery. Vertebral arteries otherwise patent within the neck. Left vertebral  artery dominant. 4. Enlarged multinodular thyroid with dominant nodule measuring up to approximately 2 cm at the upper right thyroid lobe. Further evaluation with dedicated thyroid ultrasound recommended for further evaluation. This could be performed on a nonemergent outpatient  basis.   2D Echo ejection fraction 55%.  Bicuspid aortic valve without significant gradient.  LDL 127  HgbA1c 6.8  VTE prophylaxis - lovenox    Diet   Diet heart healthy/carb  modified Room service appropriate? Yes; Fluid consistency: Thin    aspirin 81 mg daily prior to admission, now on aspirin 81 mg daily and clopidogrel 75 mg daily.   Therapy recommendations: No follow-up necessary disposition:   Home  Hypertension  Home meds:  norvasc 5 mg daily  Stable . Permissive hypertension (OK if < 220/120) but gradually normalize in 5-7 days . Long-term BP goal normotensive  Hyperlipidemia  Home meds:  Atorvastatin 10mg , increased in hospital to atorvastatin 20 mg daily  LDL 127, goal < 70  Continue statin at discharge  Diabetes type II Controlled  Home meds:  Metformin 500 mg daily  HgbA1c 6.8, goal < 7.0  CBGs Recent Labs    12/14/20 2141 12/15/20 0639 12/15/20 1201  GLUCAP 315* 167* 158*       Other Stroke Risk Factors  Advanced Age >/= 31   Other Active Problems  Memory issues per daughter some dementia at [facility] day # 1    EEG suggest focal left temporal slowing which may be postictal in retrospect patient's episode could have been seizure with some postictal focal deficits confusion which appears to have resolved.  Keppra XR 500 mg daily for seizure prevention and will need repeat EEG upon follow-up visit in the clinic in 6 weeks.  Long discussion with the patient and daughter and Dr. Dwyane Dee and answered questions.  Greater than 50% time during this 25-minute visit was spent in counseling and coordination of care about possible syncopal episode and TIA and answering questions and discussion with care team. Antony Contras, MD To contact Stroke Continuity provider, please refer to [email or web address] After hours, contact General Neurology

## 2020-12-15 NOTE — Evaluation (Addendum)
Occupational Therapy Evaluation Patient Details Name: Christine Newton MRN: [id number] DOB: 05-Mar-1933 Today's Date: 12/15/2020    History of Present Illness Pt is an 86 y/o female admitted secondary to AMS. MRI negative for acute infarct but did show pituitary mass. PMH includes HTN, DM, and dementia.   Clinical Impression   PT admitted with AMS. Pt currently with functional limitiations due to the deficits listed below (see OT problem list). pT currently demonstrates DOE with movement HR 138 with O2 99% RA at this time. Pt reports feeling "normal" Pt currently using RW min guard (A)  Pt will benefit from skilled OT to increase their independence and safety with adls and balance to allow discharge home with daughter.     Follow Up Recommendations  No OT follow up    Equipment Recommendations  None recommended by OT    Recommendations for Other Services       Precautions / Restrictions Precautions Precautions: Fall Restrictions Weight Bearing Restrictions: No      Mobility Bed Mobility Overal bed mobility: Needs Assistance Bed Mobility: Sit to Supine;Supine to Sit     Supine to sit: Supervision Sit to supine: Supervision        Transfers Overall transfer level: Needs assistance Equipment used: Rolling walker (2 wheeled) Transfers: Sit to/from Stand Sit to Stand: Supervision              Balance                                           ADL either performed or assessed with clinical judgement   ADL Overall ADL's : Needs assistance/impaired Eating/Feeding: Supervision/ safety   Grooming: Wash/dry hands;Supervision/safety;Standing   Upper Body Bathing: Supervision/ safety;Sitting   Lower Body Bathing: Min guard;Sit to/from stand           Toilet Transfer: Minimal assistance;Regular Toilet;RW;Grab bars Armed forces technical officer Details (indicate cue type and reason): grab bar on Right side Toileting- Clothing Manipulation and Hygiene:  Supervision/safety       Functional mobility during ADLs: Min guard;Rolling walker General ADL Comments: daughter present and reports pt appears close to baseline and concern is SOB with transfers. pt appears DOE with O2 99% RA     Vision Baseline Vision/History: Wears glasses Wears Glasses: Reading only Vision Assessment?: Yes Eye Alignment: Within Functional Limits Ocular Range of Motion: Within Functional Limits Alignment/Gaze Preference: Within Defined Limits Tracking/Visual Pursuits: Able to track stimulus in all quads without difficulty Visual Fields: No apparent deficits     Perception     Praxis      Pertinent Vitals/Pain Pain Assessment: No/denies pain     Hand Dominance Left   Extremity/Trunk Assessment Upper Extremity Assessment Upper Extremity Assessment: Overall WFL for tasks assessed   Lower Extremity Assessment Lower Extremity Assessment: Defer to PT evaluation   Cervical / Trunk Assessment Cervical / Trunk Assessment: Normal   Communication Communication Communication: No difficulties   Cognition Arousal/Alertness: Awake/alert Behavior During Therapy: WFL for tasks assessed/performed Overall Cognitive Status: History of cognitive impairments - at baseline                                     General Comments  hr 138 with bathroom transfer    Exercises     Shoulder Instructions  Home Living Family/patient expects to be discharged to:: Private residence Living Arrangements: Children Available Help at Discharge: Family Type of Home: House Home Access: Level entry     Home Layout: One level     Bathroom Shower/Tub: Chief Strategy Officer: Standard     Home Equipment: Toilet riser;Walker - 2 wheels;Tub bench   Additional Comments: Aide comes 5 days/week for ~4 hours (medications, cooks, helps with showering)  Lives With: Daughter Family uses alexa to announce every 2 hours transfer to the bathroom and  reminders for patient in the home    Prior Functioning/Environment Level of Independence: Needs assistance  Gait / Transfers Assistance Needed: Uses RW for ambulation ADL's / Homemaking Assistance Needed: Aide assists with bathing/dressing            OT Problem List: Decreased activity tolerance;Impaired balance (sitting and/or standing);Cardiopulmonary status limiting activity      OT Treatment/Interventions: Self-care/ADL training;DME and/or AE instruction;Energy conservation;Therapeutic activities;Cognitive remediation/compensation;Patient/family education;Balance training    OT Goals(Current goals can be found in the care plan section) Acute Rehab OT Goals Patient Stated Goal: to go home OT Goal Formulation: With patient/family Time For Goal Achievement: 12/29/20 Potential to Achieve Goals: Good  OT Frequency: Min 2X/week   Barriers to D/C:            Co-evaluation              AM-PAC OT "6 Clicks" Daily Activity     Outcome Measure Help from another person eating meals?: A Little Help from another person taking care of personal grooming?: A Little Help from another person toileting, which includes using toliet, bedpan, or urinal?: A Little Help from another person bathing (including washing, rinsing, drying)?: A Little Help from another person to put on and taking off regular upper body clothing?: A Little Help from another person to put on and taking off regular lower body clothing?: A Little 6 Click Score: 18   End of Session Equipment Utilized During Treatment: Rolling walker Nurse Communication: Mobility status;Precautions  Activity Tolerance: Patient tolerated treatment well Patient left: in bed;with call bell/phone within reach;with family/visitor present  OT Visit Diagnosis: Unsteadiness on feet (R26.81);Muscle weakness (generalized) (M62.81)                Time: 8413-2440 OT Time Calculation (min): 34 min Charges:  OT General Charges $OT Visit: 1  Visit OT Evaluation $OT Eval Moderate Complexity: 1 Mod OT Treatments $Self Care/Home Management : 8-22 mins   Brynn, OTR/L  Acute Rehabilitation Services Pager: [phone] Office: [phone] .   Mateo Flow 12/15/2020, 10:44 AM

## 2020-12-15 NOTE — Discharge Summary (Signed)
Physician Discharge Summary   Christine Newton WNU:[id number] DOB: 1933-03-17 DOA: 12/13/2020  PCP: Lillard Anes, MD  Admit date: 12/13/2020 Discharge date: 12/15/2020  Admitted From: home Disposition:  home Discharging physician: Dwyane Dee, MD  Recommendations for Outpatient Follow-up:  1. Follow up with neurology 2. Needs referral to endocrinology regarding pituitary macroadenoma and thyroid nodules  Patient discharged to home in Discharge Condition: stable CODE STATUS: Full Diet recommendation:  Diet Orders (From admission, onward)    Start     Ordered   12/15/20 0746  Diet heart healthy/carb modified Room service appropriate? Yes; Fluid consistency: Thin  Diet effective now       Question Answer Comment  Diet-HS Snack? Nothing   Room service appropriate? Yes   Fluid consistency: Thin      12/15/20 0746   12/15/20 0000  Diet - low sodium heart healthy        12/15/20 1248   12/15/20 0000  Diet Carb Modified        12/15/20 1248          Hospital Course: Christine Newton is a 86 y.o. female with hx of non-insulin-dependent type 2 diabetes, hypertension, dementia, aortic valve repair over with bioprosthesis in 2014 (only on baby aspirin daily), who presented to the hospital on 12/13/2020 via EMS secondary to altered mental status.  Patient was a poor historian on admission and her daughter helped provide collateral information.   Apparently patient was eating lunch when the daughter noticed she was slumped over and altered, not responsive, but no loss of consciousness.  Patient herself says she thinks she "blacked out".  She then helped her mother to the ground and called EMS for help.  They deny any similar symptoms in the past.  She has been in her normal state of health over the past week.  No recent changes in medication.  No history of being on anticoagulation but again is on daily aspirin daily.  At home the patient's blood pressure usually runs  around SBP 110s.  Code stroke was initially called on arrival to the ER.  CT head showed chronic small vessel ischemic changes and old left-sided infarct.  She also has a known pituitary macroadenoma which they have been undergoing evaluation outpatient.  She was evaluated by neurology during hospitalization as well.  She underwent EEG testing.  Her presentation was considered combination of TIA likely due to underlying hypotension and syncopal episode at home prior to admission as well as possible underlying seizure activity given slowing noted on EEG per neurology. She was continued on aspirin and Plavix.  In addition, she was also started on Keppra XR 500 mg daily per neurology recommendations. She will follow up outpatient with neurology at discharge as well.  She had some hypotension at home prior to admission which was considered to have contributed to her syncopal episode.  Amlodipine was discontinued and she was continued on lisinopril 20 mg daily.  They were instructed to monitor blood pressure at home and could increase lisinopril dosing if needed prior to reinitiation of amlodipine (ideally would like to max out lisinopril dose 1st).  Daughter had voiced understanding to these instructions.  TIA -Patient presents from home with acute mental status changes, and right-sided weakness which has currently resolved; concern for TIA; neuro seems to agree as well; likely precipitated from hypotension/syncope at home (BP likely too low given her age and co-morbidities) -CT head on admission reviewed, no acute processes but findings consistent with chronic  small vessel disease and previous/old infarct -Daily aspirin and Plavix - lipitor -Echo: EF 50-55%, Gr 1 DD  Acute metabolic encephalopathy -Likely multifactorial in setting of TIA on admission as well as syncopal episode at home.  However, EEG also noted some temporal lobe slowing but no obvious seizure activity.  Further review from neurology  suggest that this may be concerning for possibly being postictal and she was recommended to start on Keppra 500 mg XR daily which was prescribed prior to discharge.  Also discussed with daughter and patient via neurology before discharge -Outpatient neurology follow-up  Pituitary mass -CT head without contrast reviewed, sellar mass likely representing pituitary adenoma - patient following outpatient with PCP for this already; continue outpatient eval - no obvious symptoms at this time -Needs referral to endocrinology outpatient  Essential hypertension -Pressure reported at home from daughter considered too low given patient's age and comorbidities -Okay to allow for some permissive hypertension.  Goal SBP approximately 120-140 -Discontinued amlodipine and continued her only on lisinopril 20 mg daily which may need to be adjusted at outpatient follow-up  Non-insulin-dependent type 2 diabetes -Hold home metformin; resumed at discharge -Hemoglobin A1c 6.8%  Thyroid nodules -Dominant nodule measures approximately 2 cm at the upper right thyroid lobe -Findings discussed with daughter and patient bedside -Outpatient follow-up for further evaluation including ultrasound; endocrinology referral outpatient -TSH normal, 0.564  Pyuria -Urinalysis on admission: Leukocytes trace, nitrite negative, bacteria many, WBC0-5 -She remains asymptomatic, no indication for antibiotics still at this time   The patient's chronic medical conditions were treated accordingly per the patient's home medication regimen except as noted.  On day of discharge, patient was felt deemed stable for discharge. Patient/family member advised to call PCP or come back to ER if needed.   Principal Diagnosis: TIA (transient ischemic attack)  Discharge Diagnoses: Active Hospital Problems   Diagnosis Date Noted  . TIA (transient ischemic attack) 12/13/2020  . Thyroid nodule 12/14/2020  . Obesity, diabetes, and  hypertension syndrome ([facility]) 09/17/2020  . Adenoma of pituitary ([facility]) 11/04/2013  . Essential hypertension 11/04/2013    Resolved Hospital Problems   Diagnosis Date Noted Date Resolved  . Near syncope 12/15/2020 12/15/2020    Discharge Instructions    Diet - low sodium heart healthy   Complete by: As directed    Diet Carb Modified   Complete by: As directed    Increase activity slowly   Complete by: As directed      Allergies as of 12/15/2020      Reactions   Cephalosporins Nausea Only, Other (See Comments)   Other Reaction: Allergy      Medication List    STOP taking these medications   amLODipine 5 MG tablet Commonly known as: NORVASC     TAKE these medications   Accu-Chek Guide w/Device Kit 1 each by Does not apply route 2 (two) times daily as needed.   Accu-Chek Softclix Lancets lancets 1 each by Other route daily. Use as instructed   aspirin EC 81 MG tablet Take 81 mg by mouth daily. Swallow whole.   atorvastatin 20 MG tablet Commonly known as: LIPITOR Take 1 tablet (20 mg total) by mouth daily. Start taking on: December 16, 2020 What changed:   medication strength  how much to take   clopidogrel 75 MG tablet Commonly known as: PLAVIX Take 1 tablet (75 mg total) by mouth daily. Start taking on: December 16, 2020   glucose blood test strip 1 each by Other route 2 (two)  times daily as needed. Use as instructed   levETIRAcetam 500 MG 24 hr tablet Commonly known as: Keppra XR Take 1 tablet (500 mg total) by mouth daily.   lisinopril 20 MG tablet Commonly known as: ZESTRIL TAKE 1 TABLET BY MOUTH EVERY DAY   metFORMIN 500 MG tablet Commonly known as: GLUCOPHAGE Take 500 mg by mouth daily with breakfast.       Allergies  Allergen Reactions  . Cephalosporins Nausea Only and Other (See Comments)    Other Reaction: Allergy    Consultations: Neurology  Discharge Exam: BP (!) 160/86 (BP Location: Right Arm)   Pulse 73   Temp 97.8 F (36.6  C) (Oral)   Resp 18   Ht 5' 6" (1.676 m)   Wt 63.7 kg   SpO2 100%   BMI 22.67 kg/m  General appearance: alert, cooperative and no distress Head: Normocephalic, without obvious abnormality, atraumatic Eyes: EOMI Lungs: clear to auscultation bilaterally Heart: regular rate and rhythm and S1, S2 normal Abdomen: normal findings: bowel sounds normal and soft, non-tender Extremities: No edema Skin: mobility and turgor normal Neurologic: Grossly normal  The results of significant diagnostics from this hospitalization (including imaging, microbiology, ancillary and laboratory) are listed below for reference.   Microbiology: Recent Results (from the past 240 hour(s))  Urine culture     Status: Abnormal (Preliminary result)   Collection Time: 12/13/20  4:18 PM   Specimen: Urine, Random  Result Value Ref Range Status   Specimen Description URINE, RANDOM  Final   Special Requests NONE  Final   Culture (A)  Final    >=100,000 COLONIES/mL ESCHERICHIA COLI SUSCEPTIBILITIES TO FOLLOW Performed at [facility], [address] [place], [place] [address]    Report Status PENDING  Incomplete  Resp Panel by RT-PCR (Flu A&B, Covid) Nasopharyngeal Swab     Status: None   Collection Time: 12/13/20  5:51 PM   Specimen: Nasopharyngeal Swab; Nasopharyngeal(NP) swabs in vial transport medium  Result Value Ref Range Status   SARS Coronavirus 2 by RT PCR NEGATIVE NEGATIVE Final    Comment: (NOTE) SARS-CoV-2 target nucleic acids are NOT DETECTED.  The SARS-CoV-2 RNA is generally detectable in upper respiratory specimens during the acute phase of infection. The lowest concentration of SARS-CoV-2 viral copies this assay can detect is 138 copies/mL. A negative result does not preclude SARS-Cov-2 infection and should not be used as the sole basis for treatment or other patient management decisions. A negative result may occur with  improper specimen collection/handling, submission of specimen  other than nasopharyngeal swab, presence of viral mutation(s) within the areas targeted by this assay, and inadequate number of viral copies(<138 copies/mL). A negative result must be combined with clinical observations, patient history, and epidemiological information. The expected result is Negative.  Fact Sheet for Patients:  EntrepreneurPulse.com.au  Fact Sheet for Healthcare Providers:  IncredibleEmployment.be  This test is no t yet approved or cleared by the [place] FDA and  has been authorized for detection and/or diagnosis of SARS-CoV-2 by FDA under an Emergency Use Authorization (EUA). This EUA will remain  in effect (meaning this test can be used) for the duration of the COVID-19 declaration under Section 564(b)(1) of the Act, 21 U.S.C.section 360bbb-3(b)(1), unless the authorization is terminated  or revoked sooner.       Influenza A by PCR NEGATIVE NEGATIVE Final   Influenza B by PCR NEGATIVE NEGATIVE Final    Comment: (NOTE) The Xpert Xpress SARS-CoV-2/FLU/RSV plus assay is intended  as an aid in the diagnosis of influenza from Nasopharyngeal swab specimens and should not be used as a sole basis for treatment. Nasal washings and aspirates are unacceptable for Xpert Xpress SARS-CoV-2/FLU/RSV testing.  Fact Sheet for Patients: EntrepreneurPulse.com.au  Fact Sheet for Healthcare Providers: IncredibleEmployment.be  This test is not yet approved or cleared by the [place] FDA and has been authorized for detection and/or diagnosis of SARS-CoV-2 by FDA under an Emergency Use Authorization (EUA). This EUA will remain in effect (meaning this test can be used) for the duration of the COVID-19 declaration under Section 564(b)(1) of the Act, 21 U.S.C. section 360bbb-3(b)(1), unless the authorization is terminated or revoked.  Performed at [facility], [place] [address] [place],  [place] [address]      Labs: BNP (last 3 results) No results for input(s): BNP in the last 8760 hours. Basic Metabolic Panel: Recent Labs  Lab 12/13/20 1635 12/13/20 1641 12/14/20 0431 12/15/20 0225  NA 144 144 147* 145  K 4.1 4.0 3.9 3.3*  CL 109 110 113* 113*  CO2 21*  --  22 20*  GLUCOSE 124* 119* 152* 142*  BUN 39* 50* 31* 30*  CREATININE 0.98 0.80 0.86 0.94  CALCIUM 9.1  --  8.6* 8.3*  MG  --   --  2.2 2.1  PHOS  --   --  4.4  --    Liver Function Tests: Recent Labs  Lab 12/13/20 1635  AST 17  ALT 14  ALKPHOS 53  BILITOT 0.4  PROT 6.4*  ALBUMIN 3.0*   No results for input(s): LIPASE, AMYLASE in the last 168 hours. No results for input(s): AMMONIA in the last 168 hours. CBC: Recent Labs  Lab 12/13/20 1635 12/13/20 1641 12/14/20 0431 12/15/20 0225  WBC 15.7*  --  13.6* 15.4*  NEUTROABS 9.0*  --  10.7* 12.9*  HGB 13.6 14.6 13.8 12.3  HCT 44.5 43.0 44.4 39.1  MCV 97.4  --  96.1 93.1  PLT 206  --  195 204   Cardiac Enzymes: No results for input(s): CKTOTAL, CKMB, CKMBINDEX, TROPONINI in the last 168 hours. BNP: Invalid input(s): POCBNP CBG: Recent Labs  Lab 12/14/20 1132 12/14/20 1822 12/14/20 2141 12/15/20 0639 12/15/20 1201  GLUCAP 185* 241* 315* 167* 158*   D-Dimer No results for input(s): DDIMER in the last 72 hours. Hgb A1c Recent Labs    12/14/20 0431  HGBA1C 6.8*   Lipid Profile Recent Labs    12/14/20 0431  CHOL 186  HDL 45  LDLCALC 127*  TRIG 68  CHOLHDL 4.1   Thyroid function studies Recent Labs    12/14/20 0431  TSH 0.564   Anemia work up No results for input(s): VITAMINB12, FOLATE, FERRITIN, TIBC, IRON, RETICCTPCT in the last 72 hours. Urinalysis    Component Value Date/Time   COLORURINE YELLOW 12/13/2020 1838   APPEARANCEUR HAZY (A) 12/13/2020 1838   LABSPEC 1.006 12/13/2020 1838   PHURINE 6.0 12/13/2020 1838   GLUCOSEU NEGATIVE 12/13/2020 1838   HGBUR SMALL (A) 12/13/2020 1838   BILIRUBINUR NEGATIVE 12/13/2020  1838   BILIRUBINUR negative 09/21/2020 1523   KETONESUR NEGATIVE 12/13/2020 1838   PROTEINUR NEGATIVE 12/13/2020 1838   UROBILINOGEN 0.2 09/21/2020 1523   NITRITE NEGATIVE 12/13/2020 1838   LEUKOCYTESUR TRACE (A) 12/13/2020 1838   Sepsis Labs Invalid input(s): PROCALCITONIN,  WBC,  LACTICIDVEN Microbiology Recent Results (from the past 240 hour(s))  Urine culture     Status: Abnormal (Preliminary result)   Collection Time:  12/13/20  4:18 PM   Specimen: Urine, Random  Result Value Ref Range Status   Specimen Description URINE, RANDOM  Final   Special Requests NONE  Final   Culture (A)  Final    >=100,000 COLONIES/mL ESCHERICHIA COLI SUSCEPTIBILITIES TO FOLLOW Performed at [facility], [address] [place], [place] [address]    Report Status PENDING  Incomplete  Resp Panel by RT-PCR (Flu A&B, Covid) Nasopharyngeal Swab     Status: None   Collection Time: 12/13/20  5:51 PM   Specimen: Nasopharyngeal Swab; Nasopharyngeal(NP) swabs in vial transport medium  Result Value Ref Range Status   SARS Coronavirus 2 by RT PCR NEGATIVE NEGATIVE Final    Comment: (NOTE) SARS-CoV-2 target nucleic acids are NOT DETECTED.  The SARS-CoV-2 RNA is generally detectable in upper respiratory specimens during the acute phase of infection. The lowest concentration of SARS-CoV-2 viral copies this assay can detect is 138 copies/mL. A negative result does not preclude SARS-Cov-2 infection and should not be used as the sole basis for treatment or other patient management decisions. A negative result may occur with  improper specimen collection/handling, submission of specimen other than nasopharyngeal swab, presence of viral mutation(s) within the areas targeted by this assay, and inadequate number of viral copies(<138 copies/mL). A negative result must be combined with clinical observations, patient history, and epidemiological information. The expected result is Negative.  Fact Sheet for  Patients:  EntrepreneurPulse.com.au  Fact Sheet for Healthcare Providers:  IncredibleEmployment.be  This test is no t yet approved or cleared by the [place] FDA and  has been authorized for detection and/or diagnosis of SARS-CoV-2 by FDA under an Emergency Use Authorization (EUA). This EUA will remain  in effect (meaning this test can be used) for the duration of the COVID-19 declaration under Section 564(b)(1) of the Act, 21 U.S.C.section 360bbb-3(b)(1), unless the authorization is terminated  or revoked sooner.       Influenza A by PCR NEGATIVE NEGATIVE Final   Influenza B by PCR NEGATIVE NEGATIVE Final    Comment: (NOTE) The Xpert Xpress SARS-CoV-2/FLU/RSV plus assay is intended as an aid in the diagnosis of influenza from Nasopharyngeal swab specimens and should not be used as a sole basis for treatment. Nasal washings and aspirates are unacceptable for Xpert Xpress SARS-CoV-2/FLU/RSV testing.  Fact Sheet for Patients: EntrepreneurPulse.com.au  Fact Sheet for Healthcare Providers: IncredibleEmployment.be  This test is not yet approved or cleared by the [place] FDA and has been authorized for detection and/or diagnosis of SARS-CoV-2 by FDA under an Emergency Use Authorization (EUA). This EUA will remain in effect (meaning this test can be used) for the duration of the COVID-19 declaration under Section 564(b)(1) of the Act, 21 U.S.C. section 360bbb-3(b)(1), unless the authorization is terminated or revoked.  Performed at [facility], [place] [address], [place], [place] [address]     Procedures/Studies: MR ANGIO HEAD WO CONTRAST  Result Date: 12/13/2020 CLINICAL DATA:  Initial evaluation for acute TIA. EXAM: MRI HEAD WITHOUT AND WITH CONTRAST MRA HEAD WITHOUT CONTRAST MRA NECK WITHOUT AND WITH CONTRAST TECHNIQUE: Multiplanar, multiecho pulse sequences of the brain and surrounding  structures were obtained without intravenous contrast. Angiographic images of the Circle of Willis were obtained using MRA technique without intravenous contrast. Angiographic images of the neck were obtained using MRA technique without and with intravenous contrast. Carotid stenosis measurements (when applicable) are obtained utilizing NASCET criteria, using the distal internal carotid diameter as the denominator. CONTRAST:  48m  GADAVIST GADOBUTROL 1 MMOL/ML IV SOLN COMPARISON:  Prior head CT from 12/13/2020. FINDINGS: MRI HEAD FINDINGS Brain: Generalized age-related cerebral atrophy. Patchy and confluent T2/FLAIR hyperintensity within the periventricular deep white matter both cerebral hemispheres most consistent with chronic small vessel ischemic disease. Area of encephalomalacia and gliosis involving the cortical and subcortical aspect of the left frontal lobe consistent with a chronic ischemic infarct. Additional small remote cortical/subcortical infarcts segment at the left parietal lobe. Few small remote lacunar infarcts noted about the bilateral basal ganglia and cerebellum. Additional small remote lacunar infarct noted at the left frontal centrum semi ovale. No abnormal foci of restricted diffusion to suggest acute or subacute ischemia. Gray-white matter differentiation maintained. No evidence for acute intracranial hemorrhage. Few small chronic micro hemorrhages noted at the periventricular white matter of the posterior corona radiata, likely small vessel related. Previously noted sellar/suprasellar mass again seen, measuring 2.0 x 2.5 x 2.8 cm in greatest dimensions, most consistent with a pituitary macro adenoma. Suprasellar extension to involve the pituitary stalk. Tumor abuts the optic chiasm superiorly which is slightly bowed superiorly and deviated to the right. Evidence for extension into the left cavernous sinus with tumor nearly entirely compass seen the cavernous left ICA. Probable inferior  erosion through the clivus with possible extension into the left sphenoid sinus (series 22, image 9). No other mass lesion, mass effect or midline shift. No other abnormal enhancement. No hydrocephalus or extra-axial fluid collection. Midline structures otherwise intact. Vascular: Major intracranial vascular flow voids are maintained. Skull and upper cervical spine: Craniocervical junction within normal limits. Bone marrow signal intensity normal. No acute scalp soft tissue abnormality. Small lipoma noted at the right frontal scalp. Sinuses/Orbits: Patient status post bilateral ocular lens replacement. Globes and orbital soft tissues demonstrate no acute finding. Scattered mucosal thickening noted throughout the paranasal sinuses. Mastoid air cells are clear. Inner ear structures grossly normal. Other: None. MRA HEAD FINDINGS ANTERIOR CIRCULATION: Visualized distal cervical segments of the internal carotid arteries are widely patent with antegrade flow. Petrous, cavernous, and supraclinoid ICAs widely patent without stenosis. Note made of the pituitary macro adenoma partially encasing the cavernous/supraclinoid left ICA without associated luminal narrowing. Left A1 widely patent. Right A1 hypoplastic and/or absent, accounting for the slightly diminutive right ICA is compared to the left. Normal anterior communicating artery complex. Anterior cerebral arteries patent to their distal aspects without stenosis. No M1 stenosis or occlusion. Negative MCA bifurcations. Distal MCA branches well perfused and symmetric. POSTERIOR CIRCULATION: Both V4 segments widely patent to the vertebrobasilar junction. Left V4 dominant. Both PICA origins patent and normal. Basilar widely patent to its distal aspect. Superior cerebral arteries patent bilaterally. Left PCA supplied via the basilar. Predominant fetal type origin of the right PCA. Both PCAs well perfused or distal aspects. No intracranial aneurysm. MRA NECK FINDINGS AORTIC  ARCH: Visualized aortic arch of normal caliber. Bovine branching pattern noted. No hemodynamically significant stenosis seen about the origin of the great vessels. RIGHT CAROTID SYSTEM: Right CCA widely patent from its origin to the bifurcation. Short-segment atheromatous stenosis of up to 40% by NASCET criteria noted at the origin of the right ICA. Right ICA widely patent distally to the skull base without stenosis, evidence for dissection or occlusion. LEFT CAROTID SYSTEM: Left CCA widely patent from its origin to the bifurcation without stenosis. No significant atheromatous narrowing or irregularity about the left bifurcation. Left ICA widely patent distally without stenosis, evidence for dissection or occlusion. VERTEBRAL ARTERIES: Both vertebral arteries arise from the subclavian arteries.  No proximal subclavian artery stenosis. Left vertebral artery dominant. Probable moderate approximate 50% stenosis noted at the origin of the diminutive right vertebral artery. Vertebral arteries otherwise patent within the neck without stenosis, evidence for dissection or occlusion. Enlarged multinodular goiter. Dominant nodule measuring up to approximately 2 cm present at the upper pole of the right thyroid lobe (series 17, image 26). IMPRESSION: MRI HEAD IMPRESSION: 1. No acute intracranial abnormality. 2. Underlying age-related cerebral atrophy with mild chronic microvascular ischemic disease. Superimposed chronic ischemic infarcts involving the left frontal and parietal lobes, with additional scattered remote lacunar infarcts about the left centrum semi ovale, bilateral basal ganglia, and cerebellum. 3. 2.0 x 2.5 x 2.8 cm pituitary macro adenoma with evidence for invasion into the left cavernous sinus, with probable inferior erosion into the left sphenoid sinus. MRA HEAD IMPRESSION: Negative intracranial MRA for large vessel occlusion. No hemodynamically significant or correctable stenosis identified. MRA NECK  IMPRESSION: 1. Short-segment 40% atheromatous stenosis at the origin of the right ICA. 2. Otherwise wide patency of both carotid artery systems within the neck. 3. Short-segment moderate approximate 50% stenosis at the origin of the right vertebral artery. Vertebral arteries otherwise patent within the neck. Left vertebral artery dominant. 4. Enlarged multinodular thyroid with dominant nodule measuring up to approximately 2 cm at the upper right thyroid lobe. Further evaluation with dedicated thyroid ultrasound recommended for further evaluation. This could be performed on a nonemergent outpatient basis. (Ref: J Am Coll Radiol. 2015 Feb;12(2): 143-50). Electronically Signed   By: Jeannine Boga M.D.   On: 12/13/2020 23:00   MR ANGIO NECK W WO CONTRAST  Result Date: 12/13/2020 CLINICAL DATA:  Initial evaluation for acute TIA. EXAM: MRI HEAD WITHOUT AND WITH CONTRAST MRA HEAD WITHOUT CONTRAST MRA NECK WITHOUT AND WITH CONTRAST TECHNIQUE: Multiplanar, multiecho pulse sequences of the brain and surrounding structures were obtained without intravenous contrast. Angiographic images of the Circle of Willis were obtained using MRA technique without intravenous contrast. Angiographic images of the neck were obtained using MRA technique without and with intravenous contrast. Carotid stenosis measurements (when applicable) are obtained utilizing NASCET criteria, using the distal internal carotid diameter as the denominator. CONTRAST:  60m GADAVIST GADOBUTROL 1 MMOL/ML IV SOLN COMPARISON:  Prior head CT from 12/13/2020. FINDINGS: MRI HEAD FINDINGS Brain: Generalized age-related cerebral atrophy. Patchy and confluent T2/FLAIR hyperintensity within the periventricular deep white matter both cerebral hemispheres most consistent with chronic small vessel ischemic disease. Area of encephalomalacia and gliosis involving the cortical and subcortical aspect of the left frontal lobe consistent with a chronic ischemic infarct.  Additional small remote cortical/subcortical infarcts segment at the left parietal lobe. Few small remote lacunar infarcts noted about the bilateral basal ganglia and cerebellum. Additional small remote lacunar infarct noted at the left frontal centrum semi ovale. No abnormal foci of restricted diffusion to suggest acute or subacute ischemia. Gray-white matter differentiation maintained. No evidence for acute intracranial hemorrhage. Few small chronic micro hemorrhages noted at the periventricular white matter of the posterior corona radiata, likely small vessel related. Previously noted sellar/suprasellar mass again seen, measuring 2.0 x 2.5 x 2.8 cm in greatest dimensions, most consistent with a pituitary macro adenoma. Suprasellar extension to involve the pituitary stalk. Tumor abuts the optic chiasm superiorly which is slightly bowed superiorly and deviated to the right. Evidence for extension into the left cavernous sinus with tumor nearly entirely compass seen the cavernous left ICA. Probable inferior erosion through the clivus with possible extension into the left sphenoid sinus (series 22,  image 9). No other mass lesion, mass effect or midline shift. No other abnormal enhancement. No hydrocephalus or extra-axial fluid collection. Midline structures otherwise intact. Vascular: Major intracranial vascular flow voids are maintained. Skull and upper cervical spine: Craniocervical junction within normal limits. Bone marrow signal intensity normal. No acute scalp soft tissue abnormality. Small lipoma noted at the right frontal scalp. Sinuses/Orbits: Patient status post bilateral ocular lens replacement. Globes and orbital soft tissues demonstrate no acute finding. Scattered mucosal thickening noted throughout the paranasal sinuses. Mastoid air cells are clear. Inner ear structures grossly normal. Other: None. MRA HEAD FINDINGS ANTERIOR CIRCULATION: Visualized distal cervical segments of the internal carotid  arteries are widely patent with antegrade flow. Petrous, cavernous, and supraclinoid ICAs widely patent without stenosis. Note made of the pituitary macro adenoma partially encasing the cavernous/supraclinoid left ICA without associated luminal narrowing. Left A1 widely patent. Right A1 hypoplastic and/or absent, accounting for the slightly diminutive right ICA is compared to the left. Normal anterior communicating artery complex. Anterior cerebral arteries patent to their distal aspects without stenosis. No M1 stenosis or occlusion. Negative MCA bifurcations. Distal MCA branches well perfused and symmetric. POSTERIOR CIRCULATION: Both V4 segments widely patent to the vertebrobasilar junction. Left V4 dominant. Both PICA origins patent and normal. Basilar widely patent to its distal aspect. Superior cerebral arteries patent bilaterally. Left PCA supplied via the basilar. Predominant fetal type origin of the right PCA. Both PCAs well perfused or distal aspects. No intracranial aneurysm. MRA NECK FINDINGS AORTIC ARCH: Visualized aortic arch of normal caliber. Bovine branching pattern noted. No hemodynamically significant stenosis seen about the origin of the great vessels. RIGHT CAROTID SYSTEM: Right CCA widely patent from its origin to the bifurcation. Short-segment atheromatous stenosis of up to 40% by NASCET criteria noted at the origin of the right ICA. Right ICA widely patent distally to the skull base without stenosis, evidence for dissection or occlusion. LEFT CAROTID SYSTEM: Left CCA widely patent from its origin to the bifurcation without stenosis. No significant atheromatous narrowing or irregularity about the left bifurcation. Left ICA widely patent distally without stenosis, evidence for dissection or occlusion. VERTEBRAL ARTERIES: Both vertebral arteries arise from the subclavian arteries. No proximal subclavian artery stenosis. Left vertebral artery dominant. Probable moderate approximate 50% stenosis  noted at the origin of the diminutive right vertebral artery. Vertebral arteries otherwise patent within the neck without stenosis, evidence for dissection or occlusion. Enlarged multinodular goiter. Dominant nodule measuring up to approximately 2 cm present at the upper pole of the right thyroid lobe (series 17, image 26). IMPRESSION: MRI HEAD IMPRESSION: 1. No acute intracranial abnormality. 2. Underlying age-related cerebral atrophy with mild chronic microvascular ischemic disease. Superimposed chronic ischemic infarcts involving the left frontal and parietal lobes, with additional scattered remote lacunar infarcts about the left centrum semi ovale, bilateral basal ganglia, and cerebellum. 3. 2.0 x 2.5 x 2.8 cm pituitary macro adenoma with evidence for invasion into the left cavernous sinus, with probable inferior erosion into the left sphenoid sinus. MRA HEAD IMPRESSION: Negative intracranial MRA for large vessel occlusion. No hemodynamically significant or correctable stenosis identified. MRA NECK IMPRESSION: 1. Short-segment 40% atheromatous stenosis at the origin of the right ICA. 2. Otherwise wide patency of both carotid artery systems within the neck. 3. Short-segment moderate approximate 50% stenosis at the origin of the right vertebral artery. Vertebral arteries otherwise patent within the neck. Left vertebral artery dominant. 4. Enlarged multinodular thyroid with dominant nodule measuring up to approximately 2 cm at the upper right  thyroid lobe. Further evaluation with dedicated thyroid ultrasound recommended for further evaluation. This could be performed on a nonemergent outpatient basis. (Ref: J Am Coll Radiol. 2015 Feb;12(2): 143-50). Electronically Signed   By: Jeannine Boga M.D.   On: 12/13/2020 23:00   MR BRAIN W WO CONTRAST  Result Date: 12/13/2020 CLINICAL DATA:  Initial evaluation for acute TIA. EXAM: MRI HEAD WITHOUT AND WITH CONTRAST MRA HEAD WITHOUT CONTRAST MRA NECK WITHOUT AND  WITH CONTRAST TECHNIQUE: Multiplanar, multiecho pulse sequences of the brain and surrounding structures were obtained without intravenous contrast. Angiographic images of the Circle of Willis were obtained using MRA technique without intravenous contrast. Angiographic images of the neck were obtained using MRA technique without and with intravenous contrast. Carotid stenosis measurements (when applicable) are obtained utilizing NASCET criteria, using the distal internal carotid diameter as the denominator. CONTRAST:  64m GADAVIST GADOBUTROL 1 MMOL/ML IV SOLN COMPARISON:  Prior head CT from 12/13/2020. FINDINGS: MRI HEAD FINDINGS Brain: Generalized age-related cerebral atrophy. Patchy and confluent T2/FLAIR hyperintensity within the periventricular deep white matter both cerebral hemispheres most consistent with chronic small vessel ischemic disease. Area of encephalomalacia and gliosis involving the cortical and subcortical aspect of the left frontal lobe consistent with a chronic ischemic infarct. Additional small remote cortical/subcortical infarcts segment at the left parietal lobe. Few small remote lacunar infarcts noted about the bilateral basal ganglia and cerebellum. Additional small remote lacunar infarct noted at the left frontal centrum semi ovale. No abnormal foci of restricted diffusion to suggest acute or subacute ischemia. Gray-white matter differentiation maintained. No evidence for acute intracranial hemorrhage. Few small chronic micro hemorrhages noted at the periventricular white matter of the posterior corona radiata, likely small vessel related. Previously noted sellar/suprasellar mass again seen, measuring 2.0 x 2.5 x 2.8 cm in greatest dimensions, most consistent with a pituitary macro adenoma. Suprasellar extension to involve the pituitary stalk. Tumor abuts the optic chiasm superiorly which is slightly bowed superiorly and deviated to the right. Evidence for extension into the left cavernous  sinus with tumor nearly entirely compass seen the cavernous left ICA. Probable inferior erosion through the clivus with possible extension into the left sphenoid sinus (series 22, image 9). No other mass lesion, mass effect or midline shift. No other abnormal enhancement. No hydrocephalus or extra-axial fluid collection. Midline structures otherwise intact. Vascular: Major intracranial vascular flow voids are maintained. Skull and upper cervical spine: Craniocervical junction within normal limits. Bone marrow signal intensity normal. No acute scalp soft tissue abnormality. Small lipoma noted at the right frontal scalp. Sinuses/Orbits: Patient status post bilateral ocular lens replacement. Globes and orbital soft tissues demonstrate no acute finding. Scattered mucosal thickening noted throughout the paranasal sinuses. Mastoid air cells are clear. Inner ear structures grossly normal. Other: None. MRA HEAD FINDINGS ANTERIOR CIRCULATION: Visualized distal cervical segments of the internal carotid arteries are widely patent with antegrade flow. Petrous, cavernous, and supraclinoid ICAs widely patent without stenosis. Note made of the pituitary macro adenoma partially encasing the cavernous/supraclinoid left ICA without associated luminal narrowing. Left A1 widely patent. Right A1 hypoplastic and/or absent, accounting for the slightly diminutive right ICA is compared to the left. Normal anterior communicating artery complex. Anterior cerebral arteries patent to their distal aspects without stenosis. No M1 stenosis or occlusion. Negative MCA bifurcations. Distal MCA branches well perfused and symmetric. POSTERIOR CIRCULATION: Both V4 segments widely patent to the vertebrobasilar junction. Left V4 dominant. Both PICA origins patent and normal. Basilar widely patent to its distal aspect. Superior cerebral arteries  patent bilaterally. Left PCA supplied via the basilar. Predominant fetal type origin of the right PCA. Both PCAs  well perfused or distal aspects. No intracranial aneurysm. MRA NECK FINDINGS AORTIC ARCH: Visualized aortic arch of normal caliber. Bovine branching pattern noted. No hemodynamically significant stenosis seen about the origin of the great vessels. RIGHT CAROTID SYSTEM: Right CCA widely patent from its origin to the bifurcation. Short-segment atheromatous stenosis of up to 40% by NASCET criteria noted at the origin of the right ICA. Right ICA widely patent distally to the skull base without stenosis, evidence for dissection or occlusion. LEFT CAROTID SYSTEM: Left CCA widely patent from its origin to the bifurcation without stenosis. No significant atheromatous narrowing or irregularity about the left bifurcation. Left ICA widely patent distally without stenosis, evidence for dissection or occlusion. VERTEBRAL ARTERIES: Both vertebral arteries arise from the subclavian arteries. No proximal subclavian artery stenosis. Left vertebral artery dominant. Probable moderate approximate 50% stenosis noted at the origin of the diminutive right vertebral artery. Vertebral arteries otherwise patent within the neck without stenosis, evidence for dissection or occlusion. Enlarged multinodular goiter. Dominant nodule measuring up to approximately 2 cm present at the upper pole of the right thyroid lobe (series 17, image 26). IMPRESSION: MRI HEAD IMPRESSION: 1. No acute intracranial abnormality. 2. Underlying age-related cerebral atrophy with mild chronic microvascular ischemic disease. Superimposed chronic ischemic infarcts involving the left frontal and parietal lobes, with additional scattered remote lacunar infarcts about the left centrum semi ovale, bilateral basal ganglia, and cerebellum. 3. 2.0 x 2.5 x 2.8 cm pituitary macro adenoma with evidence for invasion into the left cavernous sinus, with probable inferior erosion into the left sphenoid sinus. MRA HEAD IMPRESSION: Negative intracranial MRA for large vessel occlusion. No  hemodynamically significant or correctable stenosis identified. MRA NECK IMPRESSION: 1. Short-segment 40% atheromatous stenosis at the origin of the right ICA. 2. Otherwise wide patency of both carotid artery systems within the neck. 3. Short-segment moderate approximate 50% stenosis at the origin of the right vertebral artery. Vertebral arteries otherwise patent within the neck. Left vertebral artery dominant. 4. Enlarged multinodular thyroid with dominant nodule measuring up to approximately 2 cm at the upper right thyroid lobe. Further evaluation with dedicated thyroid ultrasound recommended for further evaluation. This could be performed on a nonemergent outpatient basis. (Ref: J Am Coll Radiol. 2015 Feb;12(2): 143-50). Electronically Signed   By: Jeannine Boga M.D.   On: 12/13/2020 23:00   DG Chest Portable 1 View  Result Date: 12/13/2020 CLINICAL DATA:  Code stroke.  Syncopal episode. EXAM: PORTABLE CHEST 1 VIEW COMPARISON:  None. FINDINGS: Patient is rotated to the right. Monitoring leads overlie the patient. Cardiac contours normal in size. Low lung volumes. Bibasilar heterogeneous opacities. Nodular opacity projects over the right upper hemithorax. No pleural effusion or pneumothorax. Thoracic spine degenerative changes. IMPRESSION: Nodular opacity projecting over the right upper hemithorax likely represents the first rib given the patient rotation. Recommend dedicated evaluation with repeat PA and lateral chest radiograph to exclude the possibility of pulmonary nodule. Heterogeneous opacities left greater than right lung bases favored to represent atelectasis. Infection not excluded. Electronically Signed   By: Lovey Newcomer M.D.   On: 12/13/2020 17:41   EEG adult  Result Date: 12/14/2020 Lora Havens, MD     12/14/2020  2:18 PM Patient Name: Christine Newton MRN: [id number] Epilepsy Attending: Lora Havens Referring Physician/Provider: Laurey Morale, NP Date:  12/14/2020  Duration: 25.01 mins Patient history: 86 y.o. female with history of  HTN and DM presenting with syncope. EEG to evaluate for seizure Level of alertness: Awake AEDs during EEG study: None Technical aspects: This EEG study was done with scalp electrodes positioned according to the 10-20 International system of electrode placement. Electrical activity was acquired at a sampling rate of 500Hz and reviewed with a high frequency filter of 70Hz and a low frequency filter of 1Hz. EEG data were recorded continuously and digitally stored. Description: The posterior dominant rhythm consists of 8 Hz activity of moderate voltage (25-35 uV) seen predominantly in posterior head regions, symmetric and reactive to eye opening and eye closing.  EEG showed intermittent left temporal 2-3Hz delta slowing. Hyperventilation and photic stimulation were not performed.   ABNORMALITY -Intermittent slow, left temporal region IMPRESSION: This study is suggestive of nonspecific cortical dysfunction in left temporal lobe. No seizures or epileptiform discharges were seen throughout the recording. Lora Havens   ECHOCARDIOGRAM COMPLETE  Result Date: 12/14/2020    ECHOCARDIOGRAM REPORT   Patient Name:   Christine Newton Date of Exam: 12/14/2020 Medical Rec #:  [id number]           Height:       66.0 in Accession #:    [id number]          Weight:       140.4 lb Date of Birth:  [birth year]           BSA:          1.721 m Patient Age:    43 years            BP:           141/71 mmHg Patient Gender: F                   HR:           71 bpm. Exam Location:  Inpatient Procedure: 2D Echo, Cardiac Doppler and Color Doppler Indications:    TIA 435.9 G45.9  History:        Patient has no prior history of Echocardiogram examinations.                 Hypertrophic Cardiomyopathy, Aortic Valve Disease; Risk                 Factors:Hypertension and Diabetes. 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                  Aortic Valve: 21 mm Carpentier bioprosthetic valve is present in                 the aortic position.  Sonographer:    Tiffany Dance Referring Phys: [id number] Orlean Bradford [place]  1. Left ventricular ejection fraction, by estimation, is 50 to 55%. The left ventricle has low normal function. The left ventricle has no regional wall motion abnormalities. There is mild left ventricular hypertrophy. Left ventricular diastolic parameters are consistent with Grade I diastolic dysfunction (impaired relaxation). Elevated left ventricular end-diastolic pressure. There is incoordinate septal motion.  2. Right ventricular systolic function is mildly reduced. The right ventricular size is normal.  3. The mitral valve is abnormal. Trivial mitral valve regurgitation. Moderate mitral annular calcification.  4. The aortic valve is a Carpentier-Edwards 21 mm bioprosthetic. Aortic valve regurgitation is  not visualized. Aortic valve area, by VTI measures 1.09 cm. Aortic valve mean gradient measures 19.0 mmHg. Aortic valve Vmax measures 2.82 m/s. Peak gradient  32 mmHg. DI is 0.38. Findings are likely normal given the size/position of this valve.  5. The inferior vena cava is normal in size with greater than 50% respiratory variability, suggesting right atrial pressure of 3 mmHg. Comparison(s): Prior images unable to be directly viewed, comparison made by report only. Changes from prior study are noted. 07/13/18: An echo was last performed at [facility] in 2019. LVEF was reportedly 55%. bioprosthetic AVR mean gradient of 16 mmHg. FINDINGS  Left Ventricle: Left ventricular ejection fraction, by estimation, is 50 to 55%. The left ventricle has low normal function. The left ventricle has no regional wall motion abnormalities. The left ventricular internal cavity size was normal in size. There is mild left ventricular hypertrophy. Incoordinate septal motion. Left ventricular diastolic parameters are consistent with  Grade I diastolic dysfunction (impaired relaxation). Elevated left ventricular end-diastolic pressure. The E/e' is 20. Right Ventricle: The right ventricular size is normal. No increase in right ventricular wall thickness. Right ventricular systolic function is mildly reduced. Left Atrium: Left atrial size was normal in size. Right Atrium: Right atrial size was normal in size. Pericardium: There is no evidence of pericardial effusion. Mitral Valve: The mitral valve is abnormal. There is mild calcification of the posterior mitral valve leaflet(s). Moderate mitral annular calcification. Trivial mitral valve regurgitation. Tricuspid Valve: The tricuspid valve is grossly normal. Tricuspid valve regurgitation is trivial. Aortic Valve: The aortic valve has been repaired/replaced. Aortic valve regurgitation is not visualized. Moderate aortic stenosis is present. Aortic valve mean gradient measures 19.0 mmHg. Aortic valve peak gradient measures 31.8 mmHg. Aortic valve area,  by VTI measures 1.09 cm. There is a 21 mm Carpentier bioprosthetic valve present in the aortic position. Echo findings are consistent with normal structure and function of the aortic valve prosthesis. Pulmonic Valve: The pulmonic valve was normal in structure. Pulmonic valve regurgitation is not visualized. Aorta: The aortic root and ascending aorta are structurally normal, with no evidence of dilitation. Venous: The inferior vena cava is normal in size with greater than 50% respiratory variability, suggesting right atrial pressure of 3 mmHg. IAS/Shunts: No atrial level shunt detected by color flow Doppler.  LEFT VENTRICLE PLAX 2D LVIDd:         3.30 cm  Diastology LVIDs:         2.40 cm  LV e' medial:    4.35 cm/s LV PW:         0.90 cm  LV E/e' medial:  20.4 LV IVS:        1.20 cm  LV e' lateral:   4.23 cm/s LVOT diam:     1.90 cm  LV E/e' lateral: 20.9 LV SV:         61 LV SV Index:   35 LVOT Area:     2.84 cm  RIGHT VENTRICLE            IVC RV  Basal diam:  2.30 cm    IVC diam: 1.30 cm RV S prime:     7.09 cm/s TAPSE (M-mode): 1.6 cm LEFT ATRIUM             Index       RIGHT ATRIUM          Index LA diam:        2.80 cm 1.63 cm/m  RA Area:  7.70 cm LA Vol (A2C):   55.7 ml 32.37 ml/m RA Volume:   9.62 ml  5.59 ml/m LA Vol (A4C):   51.7 ml 30.04 ml/m LA Biplane Vol: 57.8 ml 33.59 ml/m  AORTIC VALVE AV Area (Vmax):    1.04 cm AV Area (Vmean):   0.99 cm AV Area (VTI):     1.09 cm AV Vmax:           282.00 cm/s AV Vmean:          203.000 cm/s AV VTI:            0.558 m AV Peak Grad:      31.8 mmHg AV Mean Grad:      19.0 mmHg LVOT Vmax:         103.00 cm/s LVOT Vmean:        71.200 cm/s LVOT VTI:          0.214 m LVOT/AV VTI ratio: 0.38  AORTA Ao Root diam: 3.30 cm Ao Asc diam:  3.50 cm MITRAL VALVE MV Area (PHT): 3.37 cm     SHUNTS MV Decel Time: 225 msec     Systemic VTI:  0.21 m MV E velocity: 88.60 cm/s   Systemic Diam: 1.90 cm MV A velocity: 150.00 cm/s MV E/A ratio:  0.59 Lyman Bishop MD Electronically signed by Lyman Bishop MD Signature Date/Time: 12/14/2020/1:51:00 PM    Final    CT HEAD CODE STROKE WO CONTRAST  Result Date: 12/13/2020 CLINICAL DATA:  Code stroke. Right sided deficits. Acute stroke presentation. EXAM: CT HEAD WITHOUT CONTRAST TECHNIQUE: Contiguous axial images were obtained from the base of the skull through the vertex without intravenous contrast. COMPARISON:  None. FINDINGS: Brain: Age related atrophy. No focal abnormality affects the brainstem or cerebellum. Cerebral hemispheres show chronic small-vessel ischemic changes of the white matter. There is an old left posterior frontal vertex cortical infarction. No sign of acute infarction, intra-axial mass lesion, hemorrhage, hydrocephalus or extra-axial collection. The patient has a sellar mass with suprasellar extension most likely to represent a pituitary adenoma. Vascular: There is atherosclerotic calcification of the major vessels at the base of the brain. Skull:  Negative Sinuses/Orbits: Clear except for chronic inflammatory changes of the left division of the sphenoid sinus. Orbits negative. Other: None ASPECTS ([place] Stroke Program Early CT Score) - Ganglionic level infarction (caudate, lentiform nuclei, internal capsule, insula, M1-M3 cortex): 7 - Supraganglionic infarction (M4-M6 cortex): 3 Total score (0-10 with 10 being normal): 10 IMPRESSION: 1. No acute finding by CT. Atrophy and chronic small-vessel ischemic changes. 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 2. Sellar mass with suprasellar extension most likely to represent a pituitary adenoma. 3. ASPECTS is 10. 4. These results were communicated to Dr. Theda Sers At 4:44 pmon 12/19/2021by text page via the [facility] messaging system. Electronically Signed   By: Nelson Chimes M.D.   On: 12/13/2020 16:45     Time coordinating discharge: Over 30 minutes    Dwyane Dee, MD  [facility] 12/15/2020, 4:22 PM

## 2020-12-16 ENCOUNTER — Other Ambulatory Visit: Payer: Self-pay | Admitting: Legal Medicine

## 2020-12-16 LAB — URINE CULTURE: Culture: >=100000 — AB

## 2020-12-16 MED ORDER — CIPROFLOXACIN HCL 250 MG PO TABS: 250.0000 mg | ORAL_TABLET | Freq: Two times a day (BID) | ORAL | 0 refills | Status: DC

## 2020-12-16 NOTE — Progress Notes (Signed)
Urine culture E.coli sensitive to cipro, called in, no antibiotics upon discharge lp

## 2020-12-17 ENCOUNTER — Telehealth (HOSPITAL_COMMUNITY): Payer: Self-pay | Admitting: Interventional Cardiology

## 2020-12-17 NOTE — Telephone Encounter (Signed)
Patient had her echocardiogram at the [facility].

## 2020-12-17 NOTE — Telephone Encounter (Signed)
Echo is stable compared with the 2019 echo from [facility]. Valve leaflets are a little stiffer. No worries.

## 2020-12-21 ENCOUNTER — Encounter: Payer: Self-pay | Admitting: Legal Medicine

## 2020-12-21 ENCOUNTER — Other Ambulatory Visit: Payer: Self-pay

## 2020-12-21 ENCOUNTER — Ambulatory Visit: Payer: Medicare PPO | Admitting: Legal Medicine

## 2020-12-21 VITALS — BP 124/70 | HR 81 | Temp 97.2°F | Ht 66.0 in | Wt 135.0 lb

## 2020-12-21 DIAGNOSIS — G459 Transient cerebral ischemic attack, unspecified: Secondary | ICD-10-CM

## 2020-12-21 DIAGNOSIS — E041 Nontoxic single thyroid nodule: Secondary | ICD-10-CM

## 2020-12-21 DIAGNOSIS — R930 Abnormal findings on diagnostic imaging of skull and head, not elsewhere classified: Secondary | ICD-10-CM

## 2020-12-21 LAB — POCT URINALYSIS DIP (CLINITEK)
Bilirubin, UA: NEGATIVE
Blood, UA: NEGATIVE
Glucose, UA: NEGATIVE mg/dL
Ketones, POC UA: NEGATIVE mg/dL
Leukocytes, UA: NEGATIVE
Nitrite, UA: NEGATIVE
Spec Grav, UA: 1.02 (ref 1.010–1.025)
Urobilinogen, UA: 0.2 E.U./dL
pH, UA: 5.5 (ref 5.0–8.0)

## 2020-12-21 NOTE — Progress Notes (Signed)
Subjective:  Patient ID: Christine Newton, female    DOB: 1933-04-10  Age: 86 y.o. MRN: [id number]  Chief Complaint  Patient presents with  . Transitions Of Care    Patient went to [place] on 12/13/2020 to 12/16/2020.     HPITransition of care and reconciliation of medicines  Patient admitted on 12/13/2020 for syncope.  She had alterer consciousness and believed to have TIA.  MRI shows small vessel disease in brain.  No infarction.  She was started on plavix and is back to normal status. She has enlarge sella tursica.  She has multiple thyroid nodules up to 2 cm.   Current Outpatient Medications on File Prior to Visit  Medication Sig Dispense Refill  . Accu-Chek Softclix Lancets lancets 1 each by Other route daily. Use as instructed 100 each 2  . aspirin EC 81 MG tablet Take 81 mg by mouth daily. Swallow whole.    Marland Kitchen atorvastatin (LIPITOR) 20 MG tablet Take 1 tablet (20 mg total) by mouth daily. 90 tablet 3  . Blood Glucose Monitoring Suppl (ACCU-CHEK GUIDE) w/Device KIT 1 each by Does not apply route 2 (two) times daily as needed. 1 kit 0  . ciprofloxacin (CIPRO) 250 MG tablet Take 1 tablet (250 mg total) by mouth 2 (two) times daily. 14 tablet 0  . clopidogrel (PLAVIX) 75 MG tablet Take 1 tablet (75 mg total) by mouth daily. 30 tablet 3  . glucose blood test strip 1 each by Other route 2 (two) times daily as needed. Use as instructed 200 each 2  . levETIRAcetam (KEPPRA XR) 500 MG 24 hr tablet Take 1 tablet (500 mg total) by mouth daily. 30 tablet 3  . lisinopril (ZESTRIL) 20 MG tablet TAKE 1 TABLET BY MOUTH EVERY DAY 90 tablet 2  . metFORMIN (GLUCOPHAGE) 500 MG tablet Take 500 mg by mouth daily with breakfast.     No current facility-administered medications on file prior to visit.   Past Medical History:  Diagnosis Date  . Diabetes ([place])   . Hypertension   . Nonrheumatic aortic (valve) stenosis 09/21/2020   Past Surgical History:  Procedure Laterality Date  . CARDIAC  VALVE REPLACEMENT    . CHOLECYSTECTOMY      No family history on file. Social History   Socioeconomic History  . Marital status: Widowed    Spouse name: Not on file  . Number of children: Not on file  . Years of education: Not on file  . Highest education level: Not on file  Occupational History  . Not on file  Tobacco Use  . Smoking status: Never Smoker  . Smokeless tobacco: Never Used  Substance and Sexual Activity  . Alcohol use: Never  . Drug use: Never  . Sexual activity: Not Currently  Other Topics Concern  . Not on file  Social History Narrative  . Not on file   Social Determinants of Health   Financial Resource Strain: Not on file  Food Insecurity: Not on file  Transportation Needs: Not on file  Physical Activity: Not on file  Stress: Not on file  Social Connections: Not on file    Review of Systems  Constitutional: Negative for activity change, chills and fever.  HENT: Positive for ear pain. Negative for congestion.   Eyes: Negative for visual disturbance.  Cardiovascular: Negative for chest pain, palpitations and leg swelling.  Gastrointestinal: Negative for abdominal distention, abdominal pain and nausea.  Endocrine: Positive for polyuria.  Genitourinary: Positive for difficulty urinating,  dyspareunia and urgency.  Musculoskeletal: Positive for arthralgias. Negative for back pain and neck pain.  Skin: Negative.   Neurological: Positive for syncope.       Uses walker  Psychiatric/Behavioral: Negative.      Objective:  BP 124/70   Pulse 81   Temp (!) 97.2 F (36.2 C)   Ht '5\' 6"'  (1.676 m)   Wt 135 lb (61.2 kg)   SpO2 98%   BMI 21.79 kg/m   BP/Weight 12/21/2020 12/15/2020 60/09/9322  Systolic BP 557 322 -  Diastolic BP 70 86 -  Wt. (Lbs) 135 - 140.43  BMI 21.79 - 22.67    Physical Exam Vitals reviewed.  Constitutional:      General: She is not in acute distress.    Appearance: Normal appearance.  HENT:     Right Ear: Tympanic  membrane, ear canal and external ear normal.     Left Ear: Tympanic membrane, ear canal and external ear normal.     Mouth/Throat:     Mouth: Mucous membranes are moist.     Pharynx: Oropharynx is clear.  Eyes:     Extraocular Movements: Extraocular movements intact.     Conjunctiva/sclera: Conjunctivae normal.     Pupils: Pupils are equal, round, and reactive to light.  Cardiovascular:     Rate and Rhythm: Normal rate and regular rhythm.     Pulses: Normal pulses.     Heart sounds: Normal heart sounds. No murmur heard. No gallop.   Pulmonary:     Effort: Pulmonary effort is normal. No respiratory distress.     Breath sounds: Normal breath sounds. No rales.  Abdominal:     General: Abdomen is flat. Bowel sounds are normal. There is distension.     Palpations: Abdomen is soft.     Tenderness: There is no abdominal tenderness.  Musculoskeletal:     Cervical back: Normal range of motion and neck supple.     Left lower leg: Edema present.  Skin:    General: Skin is warm and dry.     Capillary Refill: Capillary refill takes less than 2 seconds.  Neurological:     Mental Status: She is alert. She is disoriented.     Motor: Weakness present.     Gait: Gait abnormal.  Psychiatric:        Mood and Affect: Mood normal.        Behavior: Behavior normal.        Thought Content: Thought content normal.       Lab Results  Component Value Date   WBC 15.4 (H) 12/15/2020   HGB 12.3 12/15/2020   HCT 39.1 12/15/2020   PLT 204 12/15/2020   GLUCOSE 142 (H) 12/15/2020   CHOL 186 12/14/2020   TRIG 68 12/14/2020   HDL 45 12/14/2020   LDLCALC 127 (H) 12/14/2020   ALT 14 12/13/2020   AST 17 12/13/2020   NA 145 12/15/2020   K 3.3 (L) 12/15/2020   CL 113 (H) 12/15/2020   CREATININE 0.94 12/15/2020   BUN 30 (H) 12/15/2020   CO2 20 (L) 12/15/2020   TSH 0.564 12/14/2020   INR 1.0 12/13/2020   HGBA1C 6.8 (H) 12/14/2020      Assessment & Plan:       Diagnoses and all orders for  this visit: TIA (transient ischemic attack) -     POCT URINALYSIS DIP (CLINITEK) -     Ambulatory referral to Neurology Patient was felt to have TIA.  I explained  to patient and caregiver.  Start plavix   Mass in region of sella turcica present on magnetic resonance imaging -     Ambulatory referral to Endocrinology Patient has a mass in the sella turcia and will need workup, I doubt andy surgery will be necessary  Thyroid cyst -     Ambulatory referral to Endocrinology Patient was found to have a 2cm cyst on right lobe and will need to be worked up   I spent 30 minutes dedicated to the care of this patient on the date of this encounter to include face-to-face time with the patient, as well as: I needed to explain diagnoses and the need for taking the medicines prescribed including Keppra.  Follow-up: Return as scheduled.  An After Visit Summary was printed and given to the patient.  Reinaldo Meeker, MD [name] [phone]

## 2020-12-22 ENCOUNTER — Other Ambulatory Visit (HOSPITAL_COMMUNITY): Payer: Medicare PPO

## 2020-12-22 ENCOUNTER — Encounter (HOSPITAL_COMMUNITY): Payer: Self-pay

## 2020-12-23 DIAGNOSIS — F0390 Unspecified dementia without behavioral disturbance: Secondary | ICD-10-CM | POA: Diagnosis not present

## 2020-12-23 DIAGNOSIS — Z8673 Personal history of transient ischemic attack (TIA), and cerebral infarction without residual deficits: Secondary | ICD-10-CM | POA: Diagnosis not present

## 2020-12-23 DIAGNOSIS — Z7902 Long term (current) use of antithrombotics/antiplatelets: Secondary | ICD-10-CM | POA: Diagnosis not present

## 2020-12-23 DIAGNOSIS — D352 Benign neoplasm of pituitary gland: Secondary | ICD-10-CM | POA: Diagnosis not present

## 2020-12-23 DIAGNOSIS — I119 Hypertensive heart disease without heart failure: Secondary | ICD-10-CM | POA: Diagnosis not present

## 2020-12-23 DIAGNOSIS — E041 Nontoxic single thyroid nodule: Secondary | ICD-10-CM | POA: Diagnosis not present

## 2020-12-23 DIAGNOSIS — E119 Type 2 diabetes mellitus without complications: Secondary | ICD-10-CM | POA: Diagnosis not present

## 2020-12-23 DIAGNOSIS — G9341 Metabolic encephalopathy: Secondary | ICD-10-CM | POA: Diagnosis not present

## 2020-12-23 DIAGNOSIS — R8281 Pyuria: Secondary | ICD-10-CM | POA: Diagnosis not present

## 2020-12-23 NOTE — Telephone Encounter (Signed)
Spoke with daughter and made her aware of results.  Daughter appreciative for call.  

## 2020-12-28 DIAGNOSIS — R8281 Pyuria: Secondary | ICD-10-CM | POA: Diagnosis not present

## 2020-12-28 DIAGNOSIS — Z7902 Long term (current) use of antithrombotics/antiplatelets: Secondary | ICD-10-CM | POA: Diagnosis not present

## 2020-12-28 DIAGNOSIS — I119 Hypertensive heart disease without heart failure: Secondary | ICD-10-CM | POA: Diagnosis not present

## 2020-12-28 DIAGNOSIS — E041 Nontoxic single thyroid nodule: Secondary | ICD-10-CM | POA: Diagnosis not present

## 2020-12-28 DIAGNOSIS — F0390 Unspecified dementia without behavioral disturbance: Secondary | ICD-10-CM | POA: Diagnosis not present

## 2020-12-28 DIAGNOSIS — G9341 Metabolic encephalopathy: Secondary | ICD-10-CM | POA: Diagnosis not present

## 2020-12-28 DIAGNOSIS — E119 Type 2 diabetes mellitus without complications: Secondary | ICD-10-CM | POA: Diagnosis not present

## 2020-12-28 DIAGNOSIS — Z8673 Personal history of transient ischemic attack (TIA), and cerebral infarction without residual deficits: Secondary | ICD-10-CM | POA: Diagnosis not present

## 2020-12-28 DIAGNOSIS — D352 Benign neoplasm of pituitary gland: Secondary | ICD-10-CM | POA: Diagnosis not present

## 2020-12-31 DIAGNOSIS — Z7984 Long term (current) use of oral hypoglycemic drugs: Secondary | ICD-10-CM

## 2020-12-31 DIAGNOSIS — F0390 Unspecified dementia without behavioral disturbance: Secondary | ICD-10-CM | POA: Diagnosis not present

## 2020-12-31 DIAGNOSIS — E041 Nontoxic single thyroid nodule: Secondary | ICD-10-CM | POA: Diagnosis not present

## 2020-12-31 DIAGNOSIS — E119 Type 2 diabetes mellitus without complications: Secondary | ICD-10-CM | POA: Diagnosis not present

## 2020-12-31 DIAGNOSIS — Z7982 Long term (current) use of aspirin: Secondary | ICD-10-CM

## 2020-12-31 DIAGNOSIS — R8281 Pyuria: Secondary | ICD-10-CM | POA: Diagnosis not present

## 2020-12-31 DIAGNOSIS — Z8673 Personal history of transient ischemic attack (TIA), and cerebral infarction without residual deficits: Secondary | ICD-10-CM | POA: Diagnosis not present

## 2020-12-31 DIAGNOSIS — I119 Hypertensive heart disease without heart failure: Secondary | ICD-10-CM | POA: Diagnosis not present

## 2020-12-31 DIAGNOSIS — D352 Benign neoplasm of pituitary gland: Secondary | ICD-10-CM | POA: Diagnosis not present

## 2020-12-31 DIAGNOSIS — Z7902 Long term (current) use of antithrombotics/antiplatelets: Secondary | ICD-10-CM | POA: Diagnosis not present

## 2020-12-31 DIAGNOSIS — Z9181 History of falling: Secondary | ICD-10-CM

## 2020-12-31 DIAGNOSIS — G9341 Metabolic encephalopathy: Secondary | ICD-10-CM | POA: Diagnosis not present

## 2020-12-31 DIAGNOSIS — Z952 Presence of prosthetic heart valve: Secondary | ICD-10-CM

## 2021-01-04 DIAGNOSIS — G9341 Metabolic encephalopathy: Secondary | ICD-10-CM | POA: Diagnosis not present

## 2021-01-04 DIAGNOSIS — D352 Benign neoplasm of pituitary gland: Secondary | ICD-10-CM | POA: Diagnosis not present

## 2021-01-04 DIAGNOSIS — Z8673 Personal history of transient ischemic attack (TIA), and cerebral infarction without residual deficits: Secondary | ICD-10-CM | POA: Diagnosis not present

## 2021-01-04 DIAGNOSIS — E041 Nontoxic single thyroid nodule: Secondary | ICD-10-CM | POA: Diagnosis not present

## 2021-01-04 DIAGNOSIS — I119 Hypertensive heart disease without heart failure: Secondary | ICD-10-CM | POA: Diagnosis not present

## 2021-01-04 DIAGNOSIS — Z7902 Long term (current) use of antithrombotics/antiplatelets: Secondary | ICD-10-CM | POA: Diagnosis not present

## 2021-01-04 DIAGNOSIS — R8281 Pyuria: Secondary | ICD-10-CM | POA: Diagnosis not present

## 2021-01-04 DIAGNOSIS — E119 Type 2 diabetes mellitus without complications: Secondary | ICD-10-CM | POA: Diagnosis not present

## 2021-01-04 DIAGNOSIS — F0390 Unspecified dementia without behavioral disturbance: Secondary | ICD-10-CM | POA: Diagnosis not present

## 2021-01-07 DIAGNOSIS — R8281 Pyuria: Secondary | ICD-10-CM | POA: Diagnosis not present

## 2021-01-07 DIAGNOSIS — D352 Benign neoplasm of pituitary gland: Secondary | ICD-10-CM | POA: Diagnosis not present

## 2021-01-07 DIAGNOSIS — G9341 Metabolic encephalopathy: Secondary | ICD-10-CM | POA: Diagnosis not present

## 2021-01-07 DIAGNOSIS — E119 Type 2 diabetes mellitus without complications: Secondary | ICD-10-CM | POA: Diagnosis not present

## 2021-01-07 DIAGNOSIS — Z8673 Personal history of transient ischemic attack (TIA), and cerebral infarction without residual deficits: Secondary | ICD-10-CM | POA: Diagnosis not present

## 2021-01-07 DIAGNOSIS — Z7902 Long term (current) use of antithrombotics/antiplatelets: Secondary | ICD-10-CM | POA: Diagnosis not present

## 2021-01-07 DIAGNOSIS — I119 Hypertensive heart disease without heart failure: Secondary | ICD-10-CM | POA: Diagnosis not present

## 2021-01-07 DIAGNOSIS — F0390 Unspecified dementia without behavioral disturbance: Secondary | ICD-10-CM | POA: Diagnosis not present

## 2021-01-07 DIAGNOSIS — E041 Nontoxic single thyroid nodule: Secondary | ICD-10-CM | POA: Diagnosis not present

## 2021-01-08 ENCOUNTER — Other Ambulatory Visit: Payer: Self-pay

## 2021-01-08 ENCOUNTER — Ambulatory Visit (INDEPENDENT_AMBULATORY_CARE_PROVIDER_SITE_OTHER): Payer: Medicare PPO | Admitting: Legal Medicine

## 2021-01-08 ENCOUNTER — Encounter: Payer: Self-pay | Admitting: Legal Medicine

## 2021-01-08 DIAGNOSIS — I9589 Other hypotension: Secondary | ICD-10-CM | POA: Diagnosis not present

## 2021-01-08 DIAGNOSIS — K921 Melena: Secondary | ICD-10-CM | POA: Diagnosis not present

## 2021-01-08 DIAGNOSIS — E861 Hypovolemia: Secondary | ICD-10-CM | POA: Diagnosis not present

## 2021-01-08 DIAGNOSIS — I959 Hypotension, unspecified: Secondary | ICD-10-CM | POA: Insufficient documentation

## 2021-01-08 LAB — CBC WITH DIFFERENTIAL/PLATELET
Basophils Absolute: 0.1 10*3/uL (ref 0.0–0.2)
Basos: 1 %
EOS (ABSOLUTE): 0.4 10*3/uL (ref 0.0–0.4)
Eos: 3 %
Hematocrit: 31.4 % — ABNORMAL LOW (ref 34.0–46.6)
Hemoglobin: 10.3 g/dL — ABNORMAL LOW (ref 11.1–15.9)
Lymphocytes Absolute: 2.6 10*3/uL (ref 0.7–3.1)
Lymphs: 24 %
MCH: 30 pg (ref 26.6–33.0)
MCHC: 32.8 g/dL (ref 31.5–35.7)
MCV: 92 fL (ref 79–97)
Monocytes Absolute: 0.9 10*3/uL (ref 0.1–0.9)
Monocytes: 8 %
Neutrophils Absolute: 7.1 10*3/uL — ABNORMAL HIGH (ref 1.4–7.0)
Neutrophils: 64 %
Platelets: 228 10*3/uL (ref 150–450)
RBC: 3.43 x10E6/uL — ABNORMAL LOW (ref 3.77–5.28)
RDW: 15.3 % (ref 11.7–15.4)
WBC: 11 10*3/uL — ABNORMAL HIGH (ref 3.4–10.8)

## 2021-01-08 LAB — HEMOCCULT GUIAC POC 1CARD (OFFICE): Fecal Occult Blood, POC: POSITIVE — AB

## 2021-01-08 NOTE — Progress Notes (Signed)
Subjective:  Patient ID: Christine Newton, female    DOB: 1933/12/16  Age: 87 y.o. MRN: [id number]  Chief Complaint  Patient presents with  . Hematochezia    Since 2 days ago.    HPI: Patient started having black stools 2 days a go, some Bright red blood.  No abdominal pain but having loose bm. 4 bm in 12 hours.  No weakness.  No fever or chills. She was reently in hospital with TIA and plavix was started along with ASA.  No nausea or vomiting.  Her BP is 10 points lower but refuses hospitalization.   Current Outpatient Medications on File Prior to Visit  Medication Sig Dispense Refill  . Accu-Chek Softclix Lancets lancets 1 each by Other route daily. Use as instructed 100 each 2  . atorvastatin (LIPITOR) 20 MG tablet Take 1 tablet (20 mg total) by mouth daily. 90 tablet 3  . Blood Glucose Monitoring Suppl (ACCU-CHEK GUIDE) w/Device KIT 1 each by Does not apply route 2 (two) times daily as needed. 1 kit 0  . ciprofloxacin (CIPRO) 250 MG tablet Take 1 tablet (250 mg total) by mouth 2 (two) times daily. 14 tablet 0  . glucose blood test strip 1 each by Other route 2 (two) times daily as needed. Use as instructed 200 each 2  . lisinopril (ZESTRIL) 20 MG tablet TAKE 1 TABLET BY MOUTH EVERY DAY 90 tablet 2  . metFORMIN (GLUCOPHAGE) 500 MG tablet Take 500 mg by mouth daily with breakfast.     No current facility-administered medications on file prior to visit.   Past Medical History:  Diagnosis Date  . Diabetes ([place])   . Hypertension   . Nonrheumatic aortic (valve) stenosis 09/21/2020   Past Surgical History:  Procedure Laterality Date  . CARDIAC VALVE REPLACEMENT    . CHOLECYSTECTOMY      History reviewed. No pertinent family history. Social History   Socioeconomic History  . Marital status: Widowed    Spouse name: Not on file  . Number of children: Not on file  . Years of education: Not on file  . Highest education level: Not on file  Occupational History  . Not on file   Tobacco Use  . Smoking status: Never Smoker  . Smokeless tobacco: Never Used  Substance and Sexual Activity  . Alcohol use: Never  . Drug use: Never  . Sexual activity: Not Currently  Other Topics Concern  . Not on file  Social History Narrative  . Not on file   Social Determinants of Health   Financial Resource Strain: Not on file  Food Insecurity: Not on file  Transportation Needs: Not on file  Physical Activity: Not on file  Stress: Not on file  Social Connections: Not on file    Review of Systems  Constitutional: Positive for fatigue. Negative for activity change and appetite change.  HENT: Negative for congestion.   Respiratory: Negative for shortness of breath.   Cardiovascular: Negative for chest pain, palpitations and leg swelling.  Gastrointestinal: Positive for blood in stool and diarrhea. Negative for abdominal distention and abdominal pain.  Genitourinary: Negative for difficulty urinating.  Musculoskeletal: Negative.   Psychiatric/Behavioral: Negative.      Objective:  BP (!) 102/58   Pulse (!) 110   Temp (!) 97.2 F (36.2 C)   Resp 16   Ht _0  (1.676 m)   Wt 139 lb (63 kg)   SpO2 99%   BMI 22.44 kg/m   BP/Weight 01/08/2021  12/21/2020 95/08/3266  Systolic BP 124 580 998  Diastolic BP 58 70 86  Wt. (Lbs) 139 135 -  BMI 22.44 21.79 -    Physical Exam Vitals reviewed.  Constitutional:      Appearance: Normal appearance.  HENT:     Right Ear: Tympanic membrane normal.     Left Ear: Tympanic membrane normal.  Eyes:     Conjunctiva/sclera: Conjunctivae normal.     Pupils: Pupils are equal, round, and reactive to light.  Cardiovascular:     Rate and Rhythm: Normal rate and regular rhythm.     Pulses: Normal pulses.     Heart sounds: No murmur heard. No gallop.   Pulmonary:     Effort: Pulmonary effort is normal.     Breath sounds: Normal breath sounds.  Abdominal:     General: Abdomen is flat. Bowel sounds are normal.     Tenderness:  There is no abdominal tenderness. There is no guarding.     Comments: hemoccult positive stools  Musculoskeletal:        General: Normal range of motion.     Cervical back: Normal range of motion and neck supple.  Skin:    Findings: Bruising present.  Neurological:     Mental Status: She is alert.       Lab Results  Component Value Date   WBC 15.4 (H) 12/15/2020   HGB 12.3 12/15/2020   HCT 39.1 12/15/2020   PLT 204 12/15/2020   GLUCOSE 142 (H) 12/15/2020   CHOL 186 12/14/2020   TRIG 68 12/14/2020   HDL 45 12/14/2020   LDLCALC 127 (H) 12/14/2020   ALT 14 12/13/2020   AST 17 12/13/2020   NA 145 12/15/2020   K 3.3 (L) 12/15/2020   CL 113 (H) 12/15/2020   CREATININE 0.94 12/15/2020   BUN 30 (H) 12/15/2020   CO2 20 (L) 12/15/2020   TSH 0.564 12/14/2020   INR 1.0 12/13/2020   HGBA1C 6.8 (H) 12/14/2020   Hemoglobin 12.3 12/15/20   Assessment & Plan:   Diagnoses and all orders for this visit: Melena -     Hemoccult - 1 Card (office) -     CBC with Differential Melanotic heme positive stools present with multiple stools, stat CBC ordered since patient refuses hospitalization of ER visit for this bleed., ice storm planned for weekend.  famiily is willing to bring her here on Wednesday bur not before.  They know if bleeding continues, they will need to go to hospital. Hypotension due to hypovolemia BP remaining 110       I spent 30 minutes dedicated to the care of this patient on the date of this encounter to include face-to-face time with the patient, as well as:   Follow-up: Return in about 5 days (around 01/13/2021), or for melena.  An After Visit Summary was printed and given to the patient.  Reinaldo Meeker, MD [name] [phone]

## 2021-01-09 ENCOUNTER — Other Ambulatory Visit: Payer: Self-pay | Admitting: Legal Medicine

## 2021-01-09 NOTE — Progress Notes (Signed)
CBC, hemoglobin dropped 2 points lp

## 2021-01-13 ENCOUNTER — Encounter: Payer: Self-pay | Admitting: Legal Medicine

## 2021-01-13 ENCOUNTER — Other Ambulatory Visit: Payer: Self-pay

## 2021-01-13 ENCOUNTER — Ambulatory Visit (INDEPENDENT_AMBULATORY_CARE_PROVIDER_SITE_OTHER): Payer: Medicare PPO | Admitting: Legal Medicine

## 2021-01-13 ENCOUNTER — Ambulatory Visit: Payer: Medicare PPO | Admitting: Legal Medicine

## 2021-01-13 VITALS — BP 116/72 | HR 87 | Temp 97.2°F | Ht 66.0 in | Wt 142.0 lb

## 2021-01-13 DIAGNOSIS — K921 Melena: Secondary | ICD-10-CM | POA: Diagnosis not present

## 2021-01-13 NOTE — Progress Notes (Signed)
\   Subjective:  Patient ID: Christine Newton, female    DOB: [birth year]  Age: 87 y.o. MRN: [id number]  Chief Complaint  Patient presents with  . Follow-up    HPI: follow up of GI bleed.  Hemoglobin fell from 12 to 10.3.  She is not having any further melena.  She is more awake and not having and abdominal pain.  Plavix and ASA stopped.  keppra stopped and she is more awake.   Current Outpatient Medications on File Prior to Visit  Medication Sig Dispense Refill  . Accu-Chek Softclix Lancets lancets 1 each by Other route daily. Use as instructed 100 each 2  . atorvastatin (LIPITOR) 20 MG tablet Take 1 tablet (20 mg total) by mouth daily. 90 tablet 3  . Blood Glucose Monitoring Suppl (ACCU-CHEK GUIDE) w/Device KIT 1 each by Does not apply route 2 (two) times daily as needed. 1 kit 0  . ciprofloxacin (CIPRO) 250 MG tablet Take 1 tablet (250 mg total) by mouth 2 (two) times daily. 14 tablet 0  . glucose blood test strip 1 each by Other route 2 (two) times daily as needed. Use as instructed 200 each 2  . lisinopril (ZESTRIL) 20 MG tablet TAKE 1 TABLET BY MOUTH EVERY DAY 90 tablet 2  . metFORMIN (GLUCOPHAGE) 500 MG tablet Take 500 mg by mouth daily with breakfast.     No current facility-administered medications on file prior to visit.   Past Medical History:  Diagnosis Date  . Diabetes ([place])   . Hypertension   . Nonrheumatic aortic (valve) stenosis 09/21/2020   Past Surgical History:  Procedure Laterality Date  . CARDIAC VALVE REPLACEMENT    . CHOLECYSTECTOMY      History reviewed. No pertinent family history. Social History   Socioeconomic History  . Marital status: Widowed    Spouse name: Not on file  . Number of children: Not on file  . Years of education: Not on file  . Highest education level: Not on file  Occupational History  . Not on file  Tobacco Use  . Smoking status: Never Smoker  . Smokeless tobacco: Never Used  Substance and Sexual Activity  . Alcohol use:  Never  . Drug use: Never  . Sexual activity: Not Currently  Other Topics Concern  . Not on file  Social History Narrative  . Not on file   Social Determinants of Health   Financial Resource Strain: Not on file  Food Insecurity: Not on file  Transportation Needs: Not on file  Physical Activity: Not on file  Stress: Not on file  Social Connections: Not on file    Review of Systems  Constitutional: Negative for activity change and appetite change.  HENT: Negative for congestion and sinus pain.   Eyes: Negative for visual disturbance.  Respiratory: Negative for cough and chest tightness.   Cardiovascular: Negative for chest pain, palpitations and leg swelling.  Gastrointestinal: Negative for abdominal distention, abdominal pain and blood in stool.  Endocrine: Negative for polyuria.  Genitourinary: Negative for difficulty urinating and dyspareunia.  Musculoskeletal: Negative for arthralgias and back pain.  Skin: Negative.   Neurological: Negative.   Psychiatric/Behavioral: Negative.      Objective:  BP 116/72 (BP Location: Left Arm, Patient Position: Sitting, Cuff Size: Normal)   Pulse 87   Temp (!) 97.2 F (36.2 C) (Temporal)   Ht '5\' 6"'  (1.676 m)   Wt 142 lb (64.4 kg)   SpO2 98%   BMI 22.92 kg/m  BP/Weight 01/13/2021 01/08/2021 17/71/1657  Systolic BP 903 833 383  Diastolic BP 72 58 70  Wt. (Lbs) 142 139 135  BMI 22.92 22.44 21.79    Physical Exam Vitals reviewed.  Constitutional:      Appearance: Normal appearance.  HENT:     Right Ear: Tympanic membrane normal.     Left Ear: Tympanic membrane normal.  Eyes:     Extraocular Movements: Extraocular movements intact.     Conjunctiva/sclera: Conjunctivae normal.     Pupils: Pupils are equal, round, and reactive to light.  Cardiovascular:     Rate and Rhythm: Normal rate and regular rhythm.     Pulses: Normal pulses.     Heart sounds: Normal heart sounds. No murmur heard. No gallop.   Pulmonary:     Effort:  Pulmonary effort is normal.     Breath sounds: Normal breath sounds. No rales.  Abdominal:     General: Abdomen is flat. Bowel sounds are normal. There is no distension.     Tenderness: There is no abdominal tenderness.  Musculoskeletal:     Cervical back: Normal range of motion and neck supple.  Neurological:     General: No focal deficit present.     Mental Status: She is alert and oriented to person, place, and time.       Lab Results  Component Value Date   WBC 11.0 (H) 01/08/2021   HGB 10.3 (L) 01/08/2021   HCT 31.4 (L) 01/08/2021   PLT 228 01/08/2021   GLUCOSE 142 (H) 12/15/2020   CHOL 186 12/14/2020   TRIG 68 12/14/2020   HDL 45 12/14/2020   LDLCALC 127 (H) 12/14/2020   ALT 14 12/13/2020   AST 17 12/13/2020   NA 145 12/15/2020   K 3.3 (L) 12/15/2020   CL 113 (H) 12/15/2020   CREATININE 0.94 12/15/2020   BUN 30 (H) 12/15/2020   CO2 20 (L) 12/15/2020   TSH 0.564 12/14/2020   INR 1.0 12/13/2020   HGBA1C 6.8 (H) 12/14/2020      Assessment & Plan:   Diagnoses and all orders for this visit: Melena -     CBC with Differential/Platelet Patient has not had further melanotic stool and is feeling better, check H & H        I spent 15 minutes dedicated to the care of this patient on the date of this encounter to include face-to-face time with the patient, as well as:   Follow-up: Return in about 1 month (around 02/13/2021).  An After Visit Summary was printed and given to the patient.  Reinaldo Meeker, MD [name] [phone]

## 2021-01-14 LAB — CBC WITH DIFFERENTIAL/PLATELET
Basophils Absolute: 0.2 10*3/uL (ref 0.0–0.2)
Basos: 2 %
EOS (ABSOLUTE): 0.2 10*3/uL (ref 0.0–0.4)
Eos: 2 %
Hematocrit: 30.5 % — ABNORMAL LOW (ref 34.0–46.6)
Hemoglobin: 10.2 g/dL — ABNORMAL LOW (ref 11.1–15.9)
Immature Grans (Abs): 0.7 10*3/uL — ABNORMAL HIGH (ref 0.0–0.1)
Immature Granulocytes: 5 %
Lymphocytes Absolute: 3.1 10*3/uL (ref 0.7–3.1)
Lymphs: 24 %
MCH: 29.8 pg (ref 26.6–33.0)
MCHC: 33.4 g/dL (ref 31.5–35.7)
MCV: 89 fL (ref 79–97)
Monocytes Absolute: 0.9 10*3/uL (ref 0.1–0.9)
Monocytes: 7 %
Neutrophils Absolute: 7.9 10*3/uL — ABNORMAL HIGH (ref 1.4–7.0)
Neutrophils: 60 %
Platelets: 317 10*3/uL (ref 150–450)
RBC: 3.42 x10E6/uL — ABNORMAL LOW (ref 3.77–5.28)
RDW: 14 % (ref 11.7–15.4)
WBC: 13 10*3/uL — ABNORMAL HIGH (ref 3.4–10.8)

## 2021-01-15 ENCOUNTER — Encounter: Payer: Self-pay | Admitting: Legal Medicine

## 2021-01-15 DIAGNOSIS — F0390 Unspecified dementia without behavioral disturbance: Secondary | ICD-10-CM | POA: Diagnosis not present

## 2021-01-15 DIAGNOSIS — Z7902 Long term (current) use of antithrombotics/antiplatelets: Secondary | ICD-10-CM | POA: Diagnosis not present

## 2021-01-15 DIAGNOSIS — G9341 Metabolic encephalopathy: Secondary | ICD-10-CM | POA: Diagnosis not present

## 2021-01-15 DIAGNOSIS — D352 Benign neoplasm of pituitary gland: Secondary | ICD-10-CM | POA: Diagnosis not present

## 2021-01-15 DIAGNOSIS — E041 Nontoxic single thyroid nodule: Secondary | ICD-10-CM | POA: Diagnosis not present

## 2021-01-15 DIAGNOSIS — E119 Type 2 diabetes mellitus without complications: Secondary | ICD-10-CM | POA: Diagnosis not present

## 2021-01-15 DIAGNOSIS — R8281 Pyuria: Secondary | ICD-10-CM | POA: Diagnosis not present

## 2021-01-15 DIAGNOSIS — Z8673 Personal history of transient ischemic attack (TIA), and cerebral infarction without residual deficits: Secondary | ICD-10-CM | POA: Diagnosis not present

## 2021-01-15 DIAGNOSIS — I119 Hypertensive heart disease without heart failure: Secondary | ICD-10-CM | POA: Diagnosis not present

## 2021-01-17 ENCOUNTER — Other Ambulatory Visit: Payer: Self-pay | Admitting: Legal Medicine

## 2021-01-18 DIAGNOSIS — E119 Type 2 diabetes mellitus without complications: Secondary | ICD-10-CM | POA: Diagnosis not present

## 2021-01-18 DIAGNOSIS — G9341 Metabolic encephalopathy: Secondary | ICD-10-CM | POA: Diagnosis not present

## 2021-01-18 DIAGNOSIS — F0390 Unspecified dementia without behavioral disturbance: Secondary | ICD-10-CM | POA: Diagnosis not present

## 2021-01-18 DIAGNOSIS — D352 Benign neoplasm of pituitary gland: Secondary | ICD-10-CM | POA: Diagnosis not present

## 2021-01-18 DIAGNOSIS — R8281 Pyuria: Secondary | ICD-10-CM | POA: Diagnosis not present

## 2021-01-18 DIAGNOSIS — I119 Hypertensive heart disease without heart failure: Secondary | ICD-10-CM | POA: Diagnosis not present

## 2021-01-18 DIAGNOSIS — Z8673 Personal history of transient ischemic attack (TIA), and cerebral infarction without residual deficits: Secondary | ICD-10-CM | POA: Diagnosis not present

## 2021-01-18 DIAGNOSIS — Z7902 Long term (current) use of antithrombotics/antiplatelets: Secondary | ICD-10-CM | POA: Diagnosis not present

## 2021-01-18 DIAGNOSIS — E041 Nontoxic single thyroid nodule: Secondary | ICD-10-CM | POA: Diagnosis not present

## 2021-01-20 ENCOUNTER — Ambulatory Visit: Payer: Medicare PPO | Admitting: Legal Medicine

## 2021-01-20 DIAGNOSIS — Z7902 Long term (current) use of antithrombotics/antiplatelets: Secondary | ICD-10-CM | POA: Diagnosis not present

## 2021-01-20 DIAGNOSIS — I119 Hypertensive heart disease without heart failure: Secondary | ICD-10-CM | POA: Diagnosis not present

## 2021-01-20 DIAGNOSIS — E041 Nontoxic single thyroid nodule: Secondary | ICD-10-CM | POA: Diagnosis not present

## 2021-01-20 DIAGNOSIS — F0390 Unspecified dementia without behavioral disturbance: Secondary | ICD-10-CM | POA: Diagnosis not present

## 2021-01-20 DIAGNOSIS — G9341 Metabolic encephalopathy: Secondary | ICD-10-CM | POA: Diagnosis not present

## 2021-01-20 DIAGNOSIS — R8281 Pyuria: Secondary | ICD-10-CM | POA: Diagnosis not present

## 2021-01-20 DIAGNOSIS — D352 Benign neoplasm of pituitary gland: Secondary | ICD-10-CM | POA: Diagnosis not present

## 2021-01-20 DIAGNOSIS — Z8673 Personal history of transient ischemic attack (TIA), and cerebral infarction without residual deficits: Secondary | ICD-10-CM | POA: Diagnosis not present

## 2021-01-20 DIAGNOSIS — E119 Type 2 diabetes mellitus without complications: Secondary | ICD-10-CM | POA: Diagnosis not present

## 2021-01-21 ENCOUNTER — Ambulatory Visit (INDEPENDENT_AMBULATORY_CARE_PROVIDER_SITE_OTHER): Payer: Medicare PPO | Admitting: Adult Health

## 2021-01-21 ENCOUNTER — Encounter: Payer: Self-pay | Admitting: Adult Health

## 2021-01-21 VITALS — BP 118/71 | HR 95 | Ht 66.0 in | Wt 137.6 lb

## 2021-01-21 DIAGNOSIS — R569 Unspecified convulsions: Secondary | ICD-10-CM

## 2021-01-21 DIAGNOSIS — G459 Transient cerebral ischemic attack, unspecified: Secondary | ICD-10-CM | POA: Diagnosis not present

## 2021-01-21 MED ORDER — ASPIRIN EC 81 MG PO TBEC: 81.0000 mg | DELAYED_RELEASE_TABLET | Freq: Every day | ORAL | 11 refills | Status: DC

## 2021-01-21 NOTE — Progress Notes (Signed)
[facility] [address] [place]. [place] [address] [phone]       HOSPITAL FOLLOW UP NOTE  Ms. Christine Newton Date of Birth:  November 02, 1933 Medical Record Number:  [id number]   Reason for Referral:  hospital stroke follow up    SUBJECTIVE:   CHIEF COMPLAINT:  Chief Complaint  Patient presents with  . Follow-up    RM 17 with daughter (vicki) Pt states she is doing well with PT     HPI:   Ms. Christine Newton is a 87 y.o. female with history of HTN and DM who presented to [facility] ED on 12/13/2020 after syncopal event followed by confusion, disorientation and transient right-sided weakness.  Personally reviewed hospitalization pertinent progress notes, lab work and imaging with summary provided.  Stroke work-up largely unremarkable for acute stroke.  MRI did show evidence of chronic ischemic infarcts involving the left frontal and parietal lobes and scattered remote lacunar infarcts of the left centrum semiovale, bilateral basal ganglia and cerebellum.  MRA showed 40% stenosis of right ICA origin and approximately 50% stenosis of right VA.  EEG suggestive of focal left temporal slowing which may be postictal in setting of recent event and recommended initiating Keppra XR 500 mg daily for seizure prevention.  Stroke risk factors include prior strokes on imaging, HTN, HLD and DM.  LDL 127 and increase atorvastatin from 10 mg to 20 mg daily.  A1c 6.8.  Evaluated by therapies and discharged home in stable condition without therapy needs.  She is being seen today for hospital follow up accompanied by her daughter.  She has been doing well since discharge without new or reoccurring stroke/TIA symptoms.  She had great difficulty tolerating Keppra due to confusion and lethargy therefore discontinued by PCP and now currently back to baseline.  She has not had any additional seizure type activity.  She also experienced black tarry stools after taking Plavix and aspirin for 3  weeks therefore both discontinued by PCP and he has not had any additional black tarry stools or any other signs or symptoms of bleeding.  She has remained on atorvastatin 20 mg daily without myalgias.  Blood pressure today 118/71.  Routinely monitors at home and typically 120s/70s.  Glucose levels monitored at home and typically 90s.      ROS:   14 system review of systems performed and negative with exception of those listed in HPI  PMH:  Past Medical History:  Diagnosis Date  . Diabetes ([place])   . Hypertension   . Nonrheumatic aortic (valve) stenosis 09/21/2020    PSH:  Past Surgical History:  Procedure Laterality Date  . CARDIAC VALVE REPLACEMENT    . CHOLECYSTECTOMY      Social History:  Social History   Socioeconomic History  . Marital status: Widowed    Spouse name: Not on file  . Number of children: Not on file  . Years of education: Not on file  . Highest education level: Not on file  Occupational History  . Not on file  Tobacco Use  . Smoking status: Never Smoker  . Smokeless tobacco: Never Used  Substance and Sexual Activity  . Alcohol use: Never  . Drug use: Never  . Sexual activity: Not Currently  Other Topics Concern  . Not on file  Social History Narrative  . Not on file   Social Determinants of Health   Financial Resource Strain: Not on file  Food Insecurity: Not on file  Transportation Needs: Not on file  Physical Activity: Not on file  Stress: Not on file  Social Connections: Not on file  Intimate Partner Violence: Not on file    Family History: History reviewed. No pertinent family history.  Medications:   Current Outpatient Medications on File Prior to Visit  Medication Sig Dispense Refill  . Accu-Chek Softclix Lancets lancets 1 each by Other route daily. Use as instructed 100 each 2  . atorvastatin (LIPITOR) 20 MG tablet Take 1 tablet (20 mg total) by mouth daily. 90 tablet 3  . Blood Glucose Monitoring Suppl (ACCU-CHEK GUIDE)  w/Device KIT 1 each by Does not apply route 2 (two) times daily as needed. 1 kit 0  . glucose blood test strip 1 each by Other route 2 (two) times daily as needed. Use as instructed 200 each 2  . lisinopril (ZESTRIL) 20 MG tablet TAKE 1 TABLET BY MOUTH EVERY DAY 90 tablet 2  . metFORMIN (GLUCOPHAGE) 500 MG tablet Take 500 mg by mouth daily with breakfast.     No current facility-administered medications on file prior to visit.    Allergies:   Allergies  Allergen Reactions  . Cephalosporins Nausea Only and Other (See Comments)    Other Reaction: Allergy      OBJECTIVE:  Physical Exam  Vitals:   01/21/21 1420  BP: 118/71  Pulse: 95  Weight: 137 lb 9.6 oz (62.4 kg)  Height: '5\' 6"'  (1.676 m)   Body mass index is 22.21 kg/m. No exam data present  General: well developed, well nourished,  very pleasant elderly African-American female, seated, in no evident distress Head: head normocephalic and atraumatic.   Neck: supple with no carotid or supraclavicular bruits Cardiovascular: regular rate and rhythm, no murmurs Musculoskeletal: no deformity Skin:  no rash/petichiae Vascular:  Normal pulses all extremities   Neurologic Exam Mental Status: Awake and fully alert. Oriented to place (city) and time. Recent memory impaired and remote memory intact. Attention span, concentration appropriate and fund of knowledge impaired. Mood and affect appropriate.  Cranial Nerves: Fundoscopic exam reveals sharp disc margins. Pupils equal, briskly reactive to light. Extraocular movements full without nystagmus. Visual fields full to confrontation. Hearing intact. Facial sensation intact. Face, tongue, palate moves normally and symmetrically.  Motor: Normal bulk and tone. Normal strength in all tested extremity muscles Sensory.: intact to touch , pinprick , position and vibratory sensation.  Coordination: Rapid alternating movements normal in all extremities. Finger-to-nose and heel-to-shin performed  accurately bilaterally. Gait and Station: Arises from chair without difficulty. Stance is normal. Gait demonstrates normal stride length and balance with use of rolling walker Reflexes: 1+ and symmetric. Toes downgoing.     NIHSS  0 Modified Rankin  0      ASSESSMENT: Christine Newton is a 87 y.o. year old female presented on 12/13/2020 after syncopal event followed by confusion, disorientation and transient right-sided weakness which was felt possible combination of TIA likely due to hypertension and syncopal episode prior to admission as well as possible seizure activity.  EEG suggestive of left temporal slowing therefore Keppra initiated for seizure prophylaxis. Vascular risk factors include multiple prior strokes on imaging, HTN, HLD, DM, advanced age and intracranial stenosis.      PLAN:  1. Seizure-like event: No additional events.  Unable to tolerate low-dose Keppra.  Patient is adamant that she does not want to trial any other type of seizure medications. Per daughter, she wishes for patient to have quality of life at this time.  Daughter is aware of increased potential  risk of seizures without seizure prophylaxis but they wish to hold off on trialing any other type of medication at this time.  Discussed possibility of repeating EEG which daughter plans on further discussing with family members and will call office if interested in pursuing.  She was also advised to contact office with any additional seizure events or activity 2. TIA, History of strokes (prior imaging): Restart aspirin 81 mg daily and continue atorvastatin 20 mg daily for secondary stroke prevention.  Possible GI bleed on aspirin and Plavix combination.  Advised to discontinue aspirin and follow-up with PCP if any additional bleeding is noted after restarting aspirin.  Discussed secondary stroke prevention measures and importance of close PCP follow up for aggressive stroke risk factor management including HTN with BP  goal<130/90, HLD with LDL goal<70 and DM with A1c goal<7.0    Follow up in 4 months or call earlier if needed  CC:  [facility] provider: Dr. Sarita Bottom, Zeb Comfort, MD    I spent 45 minutes of face-to-face and non-face-to-face time with patient and daughter.  This included previsit chart review including recent hospitalization pertinent progress notes, lab work and imaging, lab review, study review, order entry, electronic health record documentation, patient education regarding recent TIA and seizure-like episode, importance of managing stroke risk factors and answered all other questions to patient satisfaction   Frann Rider, AGNP-BC  [facility] [address] [place], [place] [address]  Phone [phone] Fax [phone] Note: This document was prepared with digital dictation and possible smart phrase technology. Any transcriptional errors that result from this process are unintentional.

## 2021-01-21 NOTE — Patient Instructions (Signed)
Restart aspirin 81 mg daily  and atorvastatin 20 mg daily for secondary stroke prevention  Please call office if you would like to pursue repeat EEG as EEG in the hospital showed brainwave slowing If a seizure preventative medication if needed in the future, I would not recommend restarting Keppra as she had difficulty tolerating Keppra even at a low dose.  Other considerations could be lamotrigine, Vimpat or Depakote  Continue to follow up with PCP regarding cholesterol, blood pressure and diabetes management  Maintain strict control of hypertension with blood pressure goal below 130/90, diabetes with hemoglobin A1c goal below 7.0 % and cholesterol with LDL cholesterol (bad cholesterol) goal below 70 mg/dL.       Followup in the future with me in 4 months or call earlier if needed       Thank you for coming to see [name] at [facility]. I hope we have been able to provide you high quality care today.  You may receive a patient satisfaction survey over the next few weeks. We would appreciate your feedback and comments so that we may continue to improve ourselves and the health of our patients.

## 2021-01-22 ENCOUNTER — Ambulatory Visit: Payer: Medicare PPO | Admitting: Legal Medicine

## 2021-01-22 ENCOUNTER — Telehealth: Payer: Self-pay

## 2021-01-22 DIAGNOSIS — F0390 Unspecified dementia without behavioral disturbance: Secondary | ICD-10-CM | POA: Diagnosis not present

## 2021-01-22 DIAGNOSIS — G9341 Metabolic encephalopathy: Secondary | ICD-10-CM | POA: Diagnosis not present

## 2021-01-22 DIAGNOSIS — D352 Benign neoplasm of pituitary gland: Secondary | ICD-10-CM | POA: Diagnosis not present

## 2021-01-22 DIAGNOSIS — E041 Nontoxic single thyroid nodule: Secondary | ICD-10-CM | POA: Diagnosis not present

## 2021-01-22 DIAGNOSIS — Z7902 Long term (current) use of antithrombotics/antiplatelets: Secondary | ICD-10-CM | POA: Diagnosis not present

## 2021-01-22 DIAGNOSIS — I119 Hypertensive heart disease without heart failure: Secondary | ICD-10-CM | POA: Diagnosis not present

## 2021-01-22 DIAGNOSIS — E119 Type 2 diabetes mellitus without complications: Secondary | ICD-10-CM | POA: Diagnosis not present

## 2021-01-22 DIAGNOSIS — R8281 Pyuria: Secondary | ICD-10-CM | POA: Diagnosis not present

## 2021-01-22 DIAGNOSIS — Z8673 Personal history of transient ischemic attack (TIA), and cerebral infarction without residual deficits: Secondary | ICD-10-CM | POA: Diagnosis not present

## 2021-01-22 NOTE — Telephone Encounter (Signed)
Patient went to see Neurology yesterday and He recommended to restart aspirin 81 mg everyday. They want to know your opinion due to she had blood in the stool. The nurse has not see any black stool anymore and she does not have any symptoms.

## 2021-01-22 NOTE — Telephone Encounter (Signed)
Can restart aspirin but watch stools for recurrent bleeding lp

## 2021-01-22 NOTE — Telephone Encounter (Signed)
I left detailed message on voicemail. ?

## 2021-01-25 DIAGNOSIS — E041 Nontoxic single thyroid nodule: Secondary | ICD-10-CM | POA: Diagnosis not present

## 2021-01-25 DIAGNOSIS — R8281 Pyuria: Secondary | ICD-10-CM | POA: Diagnosis not present

## 2021-01-25 DIAGNOSIS — D352 Benign neoplasm of pituitary gland: Secondary | ICD-10-CM | POA: Diagnosis not present

## 2021-01-25 DIAGNOSIS — Z8673 Personal history of transient ischemic attack (TIA), and cerebral infarction without residual deficits: Secondary | ICD-10-CM | POA: Diagnosis not present

## 2021-01-25 DIAGNOSIS — Z7902 Long term (current) use of antithrombotics/antiplatelets: Secondary | ICD-10-CM | POA: Diagnosis not present

## 2021-01-25 DIAGNOSIS — F0390 Unspecified dementia without behavioral disturbance: Secondary | ICD-10-CM | POA: Diagnosis not present

## 2021-01-25 DIAGNOSIS — I119 Hypertensive heart disease without heart failure: Secondary | ICD-10-CM | POA: Diagnosis not present

## 2021-01-25 DIAGNOSIS — G9341 Metabolic encephalopathy: Secondary | ICD-10-CM | POA: Diagnosis not present

## 2021-01-25 DIAGNOSIS — E119 Type 2 diabetes mellitus without complications: Secondary | ICD-10-CM | POA: Diagnosis not present

## 2021-01-25 NOTE — Progress Notes (Signed)
I agree with the above plan 

## 2021-01-27 DIAGNOSIS — F0390 Unspecified dementia without behavioral disturbance: Secondary | ICD-10-CM | POA: Diagnosis not present

## 2021-01-27 DIAGNOSIS — E041 Nontoxic single thyroid nodule: Secondary | ICD-10-CM | POA: Diagnosis not present

## 2021-01-27 DIAGNOSIS — Z7902 Long term (current) use of antithrombotics/antiplatelets: Secondary | ICD-10-CM | POA: Diagnosis not present

## 2021-01-27 DIAGNOSIS — E119 Type 2 diabetes mellitus without complications: Secondary | ICD-10-CM | POA: Diagnosis not present

## 2021-01-27 DIAGNOSIS — I119 Hypertensive heart disease without heart failure: Secondary | ICD-10-CM | POA: Diagnosis not present

## 2021-01-27 DIAGNOSIS — D352 Benign neoplasm of pituitary gland: Secondary | ICD-10-CM | POA: Diagnosis not present

## 2021-01-27 DIAGNOSIS — G9341 Metabolic encephalopathy: Secondary | ICD-10-CM | POA: Diagnosis not present

## 2021-01-27 DIAGNOSIS — R8281 Pyuria: Secondary | ICD-10-CM | POA: Diagnosis not present

## 2021-01-27 DIAGNOSIS — Z8673 Personal history of transient ischemic attack (TIA), and cerebral infarction without residual deficits: Secondary | ICD-10-CM | POA: Diagnosis not present

## 2021-02-02 DIAGNOSIS — E119 Type 2 diabetes mellitus without complications: Secondary | ICD-10-CM | POA: Diagnosis not present

## 2021-02-02 DIAGNOSIS — R8281 Pyuria: Secondary | ICD-10-CM | POA: Diagnosis not present

## 2021-02-02 DIAGNOSIS — D352 Benign neoplasm of pituitary gland: Secondary | ICD-10-CM | POA: Diagnosis not present

## 2021-02-02 DIAGNOSIS — I119 Hypertensive heart disease without heart failure: Secondary | ICD-10-CM | POA: Diagnosis not present

## 2021-02-02 DIAGNOSIS — G9341 Metabolic encephalopathy: Secondary | ICD-10-CM | POA: Diagnosis not present

## 2021-02-02 DIAGNOSIS — Z7902 Long term (current) use of antithrombotics/antiplatelets: Secondary | ICD-10-CM | POA: Diagnosis not present

## 2021-02-02 DIAGNOSIS — E041 Nontoxic single thyroid nodule: Secondary | ICD-10-CM | POA: Diagnosis not present

## 2021-02-02 DIAGNOSIS — F0390 Unspecified dementia without behavioral disturbance: Secondary | ICD-10-CM | POA: Diagnosis not present

## 2021-02-02 DIAGNOSIS — Z8673 Personal history of transient ischemic attack (TIA), and cerebral infarction without residual deficits: Secondary | ICD-10-CM | POA: Diagnosis not present

## 2021-02-05 ENCOUNTER — Other Ambulatory Visit: Payer: Self-pay

## 2021-02-05 DIAGNOSIS — E669 Obesity, unspecified: Secondary | ICD-10-CM

## 2021-02-05 DIAGNOSIS — E1159 Type 2 diabetes mellitus with other circulatory complications: Secondary | ICD-10-CM

## 2021-02-05 MED ORDER — ACCU-CHEK SOFTCLIX LANCET DEV KIT: 1.0000 | PACK | Freq: Three times a day (TID) | 0 refills | Status: DC

## 2021-02-10 ENCOUNTER — Ambulatory Visit: Payer: Medicare PPO | Admitting: Endocrinology

## 2021-02-18 NOTE — Progress Notes (Signed)
Cardiology Office Note:    Date:  02/19/2021   ID:  [id number] Christine Newton February 11, 1933, MRN [id number]  PCP:  Christine Anes, MD  Cardiologist:  Christine Grooms, MD   Referring MD: Christine Newton,*   Chief Complaint  Patient presents with  . Cardiac Valve Problem    Bioprosthetic aortic valve  . Hyperlipidemia  . Hypertension    History of Present Illness:    Christine Newton is a 87 y.o. female with a hx of DM II, Hypertension, hyperlipidemia, and AVR with #21 CE biologic with septal myectomy 2014 referred for longitudinal f/u.  At a hospital stay for an episode of syncope.  Blood pressures were running relatively low.  Presumed to be related to that.  Therefore amlodipine was discontinued.  ACE inhibitor therapy was continued.  Blood pressure recordings at home have been between 120 and 182 mmHg systolic.  She was also felt to have a TIA during that admission and it was difficult to discern if it was related to low blood pressure or a true event.  Aspirin and Plavix was started but Plavix had to be discontinued because of rectal bleeding after discharge.  She was also placed on Keppra which she could not tolerate.  Imaging demonstrated severe small vessel CNS disease.  Work-up included an EEG which raised the question of underlying seizure activity.  On her current medical regimen she has been fine.  She has not had seizures.  She has had no lightheadedness or dizziness.  She recently visited at the [place] with her daughter.  Has some mild ankle swelling after the trip that resolved by the next morning.  Past Medical History:  Diagnosis Date  . Diabetes ([place])   . Hypertension   . Nonrheumatic aortic (valve) stenosis 09/21/2020    Past Surgical History:  Procedure Laterality Date  . CARDIAC VALVE REPLACEMENT    . CHOLECYSTECTOMY      Current Medications: Current Meds  Medication Sig  . Accu-Chek Softclix Lancets lancets 1 each by Other route daily. Use  as instructed  . aspirin EC 81 MG tablet Take 1 tablet (81 mg total) by mouth daily. Swallow whole.  Marland Kitchen atorvastatin (LIPITOR) 20 MG tablet Take 1 tablet (20 mg total) by mouth daily.  . Blood Glucose Monitoring Suppl (ACCU-CHEK GUIDE) w/Device KIT 1 each by Does not apply route 2 (two) times daily as needed.  Marland Kitchen glucose blood test strip 1 each by Other route 2 (two) times daily as needed. Use as instructed  . Lancets Misc. (ACCU-CHEK SOFTCLIX LANCET DEV) KIT 1 each by Does not apply route 3 (three) times daily.  Marland Kitchen lisinopril (ZESTRIL) 20 MG tablet TAKE 1 TABLET BY MOUTH EVERY DAY  . metFORMIN (GLUCOPHAGE) 500 MG tablet Take 500 mg by mouth daily with breakfast.     Allergies:   Cephalosporins   Social History   Socioeconomic History  . Marital status: Widowed    Spouse name: Not on file  . Number of children: Not on file  . Years of education: Not on file  . Highest education level: Not on file  Occupational History  . Not on file  Tobacco Use  . Smoking status: Never Smoker  . Smokeless tobacco: Never Used  Substance and Sexual Activity  . Alcohol use: Never  . Drug use: Never  . Sexual activity: Not Currently  Other Topics Concern  . Not on file  Social History Narrative  . Not on file  Social Determinants of Health   Financial Resource Strain: Not on file  Food Insecurity: Not on file  Transportation Needs: Not on file  Physical Activity: Not on file  Stress: Not on file  Social Connections: Not on file     Family History: The patient's family history is not on file.  ROS:   Please see the history of present illness.    Lower extremity swelling with long car rides.  Otherwise no complaint.  Appetite is good.  All other systems reviewed and are negative.  EKGs/Labs/Other Studies Reviewed:    The following studies were reviewed today:  2D Doppler echocardiogram December 14, 2020: IMPRESSIONS  1. Left ventricular ejection fraction, by estimation, is 50 to  55%. The  left ventricle has low normal function. The left ventricle has no regional  wall motion abnormalities. There is mild left ventricular hypertrophy.  Left ventricular diastolic  parameters are consistent with Grade I diastolic dysfunction (impaired  relaxation). Elevated left ventricular end-diastolic pressure. There is  incoordinate septal motion.  2. Right ventricular systolic function is mildly reduced. The right  ventricular size is normal.  3. The mitral valve is abnormal. Trivial mitral valve regurgitation.  Moderate mitral annular calcification.  4. The aortic valve is a Carpentier-Edwards 21 mm bioprosthetic. Aortic  valve regurgitation is not visualized. Aortic valve area, by VTI measures  1.09 cm. Aortic valve mean gradient measures 19.0 mmHg. Aortic valve Vmax  measures 2.82 m/s. Peak gradient  32 mmHg. DI is 0.38. Findings are likely normal given the size/position  of this valve.  5. The inferior vena cava is normal in size with greater than 50%  respiratory variability, suggesting right atrial pressure of 3 mmHg.   Comparison(s): Prior images unable to be directly viewed, comparison made  by report only. Changes from prior study are noted. 07/13/18: An echo was  last performed at [facility] in 2019. LVEF was reportedly 55%.  bioprosthetic AVR mean gradient of 16  mmHg.   EKG:  EKG not repeated  Recent Labs: 11/18/2020: NT-Pro BNP 268 12/13/2020: ALT 14 12/14/2020: TSH 0.564 12/15/2020: BUN 30; Creatinine, Ser 0.94; Magnesium 2.1; Potassium 3.3; Sodium 145 01/13/2021: Hemoglobin 10.2; Platelets 317  Recent Lipid Panel    Component Value Date/Time   CHOL 186 12/14/2020 0431   CHOL 176 09/17/2020 1438   TRIG 68 12/14/2020 0431   HDL 45 12/14/2020 0431   HDL 49 09/17/2020 1438   CHOLHDL 4.1 12/14/2020 0431   VLDL 14 12/14/2020 0431   LDLCALC 127 (H) 12/14/2020 0431   LDLCALC 111 (H) 09/17/2020 1438    Physical Exam:    VS:  BP (!) 154/88    Pulse 79   Ht '5\' 6"'  (1.676 m)   Wt 143 lb 3.2 oz (65 kg)   SpO2 100%   BMI 23.11 kg/m     Wt Readings from Last 3 Encounters:  02/19/21 143 lb 3.2 oz (65 kg)  01/21/21 137 lb 9.6 oz (62.4 kg)  01/13/21 142 lb (64.4 kg)     GEN: Elderly. No acute distress HEENT: Normal NECK: No JVD. LYMPHATICS: No lymphadenopathy CARDIAC: 1/6 to 2/6 systolic without diastolic murmur. RRR no gallop, or edema. VASCULAR:  Normal Pulses. No bruits. RESPIRATORY:  Clear to auscultation without rales, wheezing or rhonchi  ABDOMEN: Soft, non-tender, non-distended, No pulsatile mass, MUSCULOSKELETAL: No deformity  SKIN: Warm and dry NEUROLOGIC:  Alert and oriented x 3 PSYCHIATRIC:  Normal affect   ASSESSMENT:    1. S/P AVR (  aortic valve replacement)   2. Left bundle branch block   3. Essential hypertension   4. Mixed hyperlipidemia   5. Obesity, diabetes, and hypertension syndrome ([place])   6. Educated about COVID-19 virus infection   7. Near syncope   8. Cerebrovascular disease    PLAN:    In order of problems listed above:  1. Valve function is stable, documented by echo and auscultation. 2. Not reassessed 3. Blood pressure was relatively low for age and likely contributed to near syncope.  Currently being treated with out amlodipine using Zestril 20 mg/day.  She has not had her medication yet this morning. 4. Continue low-dose statin therapy. 5. Not discussed 6. No episodes of Covid during the pandemic. 7. Near syncope likely related to cerebral hypoperfusion from overzealous blood pressure control 8. Has diffuse small vessel disease.  Failed aspirin and Plavix because of rectal bleeding.  Secondary prevention for all risk factors is necessary.   Medication Adjustments/Labs and Tests Ordered: Current medicines are reviewed at length with the patient today.  Concerns regarding medicines are outlined above.  No orders of the defined types were placed in this encounter.  No orders of the  defined types were placed in this encounter.   Patient Instructions  Medication Instructions:  Your physician recommends that you continue on your current medications as directed. Please refer to the Current Medication list given to you today. *If you need a refill on your cardiac medications before your next appointment, please call your pharmacy*   Lab Work: none If you have labs (blood work) drawn today and your tests are completely normal, you will receive your results only by: Marland Kitchen MyChart Message (if you have MyChart) OR . A paper copy in the mail If you have any lab test that is abnormal or we need to change your treatment, we will call you to review the results.   Testing/Procedures: none   Follow-Up: At [facility], you and your health needs are our priority.  As part of our continuing mission to provide you with exceptional heart care, we have created designated Provider Care Teams.  These Care Teams include your primary Cardiologist (physician) and Advanced Practice Providers (APPs -  Physician Assistants and Nurse Practitioners) who all work together to provide you with the care you need, when you need it.   Your next appointment:   12 month(s)  The format for your next appointment:   In Person  Provider:   You may see Christine Grooms, MD or one of the following Advanced Practice Providers on your designated Care Team:    Christine Drown, NP         Signed, Christine Grooms, MD  02/19/2021 11:07 AM    [place]

## 2021-02-19 ENCOUNTER — Ambulatory Visit: Payer: Medicare PPO | Admitting: Interventional Cardiology

## 2021-02-19 ENCOUNTER — Other Ambulatory Visit: Payer: Self-pay

## 2021-02-19 ENCOUNTER — Encounter: Payer: Self-pay | Admitting: Interventional Cardiology

## 2021-02-19 VITALS — BP 154/88 | HR 79 | Ht 66.0 in | Wt 143.2 lb

## 2021-02-19 DIAGNOSIS — E669 Obesity, unspecified: Secondary | ICD-10-CM

## 2021-02-19 DIAGNOSIS — R55 Syncope and collapse: Secondary | ICD-10-CM

## 2021-02-19 DIAGNOSIS — Z952 Presence of prosthetic heart valve: Secondary | ICD-10-CM | POA: Diagnosis not present

## 2021-02-19 DIAGNOSIS — E782 Mixed hyperlipidemia: Secondary | ICD-10-CM

## 2021-02-19 DIAGNOSIS — E1169 Type 2 diabetes mellitus with other specified complication: Secondary | ICD-10-CM

## 2021-02-19 DIAGNOSIS — I447 Left bundle-branch block, unspecified: Secondary | ICD-10-CM

## 2021-02-19 DIAGNOSIS — I152 Hypertension secondary to endocrine disorders: Secondary | ICD-10-CM

## 2021-02-19 DIAGNOSIS — I679 Cerebrovascular disease, unspecified: Secondary | ICD-10-CM | POA: Diagnosis not present

## 2021-02-19 DIAGNOSIS — E1159 Type 2 diabetes mellitus with other circulatory complications: Secondary | ICD-10-CM

## 2021-02-19 DIAGNOSIS — R0602 Shortness of breath: Secondary | ICD-10-CM

## 2021-02-19 DIAGNOSIS — Z7189 Other specified counseling: Secondary | ICD-10-CM

## 2021-02-19 DIAGNOSIS — I10 Essential (primary) hypertension: Secondary | ICD-10-CM | POA: Diagnosis not present

## 2021-02-19 NOTE — Patient Instructions (Signed)
Medication Instructions:  Your physician recommends that you continue on your current medications as directed. Please refer to the Current Medication list given to you today. *If you need a refill on your cardiac medications before your next appointment, please call your pharmacy*   Lab Work: none If you have labs (blood work) drawn today and your tests are completely normal, you will receive your results only by: Marland Kitchen MyChart Message (if you have MyChart) OR . A paper copy in the mail If you have any lab test that is abnormal or we need to change your treatment, we will call you to review the results.   Testing/Procedures: none   Follow-Up: At [facility], you and your health needs are our priority.  As part of our continuing mission to provide you with exceptional heart care, we have created designated Provider Care Teams.  These Care Teams include your primary Cardiologist (physician) and Advanced Practice Providers (APPs -  Physician Assistants and Nurse Practitioners) who all work together to provide you with the care you need, when you need it.   Your next appointment:   12 month(s)  The format for your next appointment:   In Person  Provider:   You may see Sinclair Grooms, MD or one of the following Advanced Practice Providers on your designated Care Team:    Kathyrn Drown, NP

## 2021-03-03 ENCOUNTER — Other Ambulatory Visit: Payer: Self-pay | Admitting: Legal Medicine

## 2021-03-03 DIAGNOSIS — E1169 Type 2 diabetes mellitus with other specified complication: Secondary | ICD-10-CM

## 2021-03-06 ENCOUNTER — Other Ambulatory Visit: Payer: Self-pay | Admitting: Legal Medicine

## 2021-03-06 DIAGNOSIS — E1122 Type 2 diabetes mellitus with diabetic chronic kidney disease: Secondary | ICD-10-CM

## 2021-05-20 ENCOUNTER — Telehealth: Payer: Self-pay

## 2021-05-20 NOTE — Telephone Encounter (Signed)
Called and VM full. Pt needs to be reschedule and seen by Jessica,NP.  Can be seen Wed June 1st at 10:15. Time blocked for pt. Please confirm if she returns call

## 2021-05-25 ENCOUNTER — Ambulatory Visit: Payer: Medicare PPO | Admitting: Family Medicine

## 2021-05-25 NOTE — Progress Notes (Deleted)
No chief complaint on file.    HISTORY OF PRESENT ILLNESS: 05/25/21 ALL:  Christine Newton is a 87 y.o. female here today for follow up for  Previous TIA and seizure in 11/2020. Last seen by Frann Rider, NP 01/21/2021.     HISTORY (copied from [place] previous note)  Christine Shiver Lutheris a 88 y.o.femalewith history of HTN and DM who presented to [facility] ED on 12/13/2020 after syncopal event followed by confusion, disorientation and transient right-sided weakness.  Personally reviewed hospitalization pertinent progress notes, lab work and imaging with summary provided.  Stroke work-up largely unremarkable for acute stroke.  MRI did show evidence of chronic ischemic infarcts involving the left frontal and parietal lobes and scattered remote lacunar infarcts of the left centrum semiovale, bilateral basal ganglia and cerebellum.  MRA showed 40% stenosis of right ICA origin and approximately 50% stenosis of right VA.  EEG suggestive of focal left temporal slowing which may be postictal in setting of recent event and recommended initiating Keppra XR 500 mg daily for seizure prevention.  Stroke risk factors include prior strokes on imaging, HTN, HLD and DM.  LDL 127 and increase atorvastatin from 10 mg to 20 mg daily.  A1c 6.8.  Evaluated by therapies and discharged home in stable condition without therapy needs.  She is being seen today for hospital follow up accompanied by her daughter.  She has been doing well since discharge without new or reoccurring stroke/TIA symptoms.  She had great difficulty tolerating Keppra due to confusion and lethargy therefore discontinued by PCP and now currently back to baseline.  She has not had any additional seizure type activity.  She also experienced black tarry stools after taking Plavix and aspirin for 3 weeks therefore both discontinued by PCP and he has not had any additional black tarry stools or any other signs or symptoms of bleeding.  She  has remained on atorvastatin 20 mg daily without myalgias.  Blood pressure today 118/71.  Routinely monitors at home and typically 120s/70s.  Glucose levels monitored at home and typically 90s.    REVIEW OF SYSTEMS: Out of a complete 14 system review of symptoms, the patient complains only of the following symptoms, and all other reviewed systems are negative.    ALLERGIES: Allergies  Allergen Reactions  . Cephalosporins Nausea Only and Other (See Comments)    Other Reaction: Allergy     HOME MEDICATIONS: Outpatient Medications Prior to Visit  Medication Sig Dispense Refill  . ACCU-CHEK GUIDE test strip 1 EACH BY OTHER ROUTE 2 (TWO) TIMES DAILY AS NEEDED. USE AS INSTRUCTED 200 strip 2  . Accu-Chek Softclix Lancets lancets 1 EACH BY DOES NOT APPLY ROUTE 3 (THREE) TIMES DAILY. 100 each 2  . aspirin EC 81 MG tablet Take 1 tablet (81 mg total) by mouth daily. Swallow whole. 30 tablet 11  . atorvastatin (LIPITOR) 20 MG tablet Take 1 tablet (20 mg total) by mouth daily. 90 tablet 3  . Blood Glucose Monitoring Suppl (ACCU-CHEK GUIDE) w/Device KIT 1 each by Does not apply route 2 (two) times daily as needed. 1 kit 0  . Lancets Misc. (ACCU-CHEK SOFTCLIX LANCET DEV) KIT 1 each by Does not apply route 3 (three) times daily. 1 kit 0  . lisinopril (ZESTRIL) 20 MG tablet TAKE 1 TABLET BY MOUTH EVERY DAY 90 tablet 2  . metFORMIN (GLUCOPHAGE) 500 MG tablet Take 500 mg by mouth daily with breakfast.     No facility-administered medications prior to visit.  PAST MEDICAL HISTORY: Past Medical History:  Diagnosis Date  . Diabetes ([place])   . Hypertension   . Nonrheumatic aortic (valve) stenosis 09/21/2020     PAST SURGICAL HISTORY: Past Surgical History:  Procedure Laterality Date  . CARDIAC VALVE REPLACEMENT    . CHOLECYSTECTOMY       FAMILY HISTORY: No family history on file.   SOCIAL HISTORY: Social History   Socioeconomic History  . Marital status: Widowed    Spouse name:  Not on file  . Number of children: Not on file  . Years of education: Not on file  . Highest education level: Not on file  Occupational History  . Not on file  Tobacco Use  . Smoking status: Never Smoker  . Smokeless tobacco: Never Used  Substance and Sexual Activity  . Alcohol use: Never  . Drug use: Never  . Sexual activity: Not Currently  Other Topics Concern  . Not on file  Social History Narrative  . Not on file   Social Determinants of Health   Financial Resource Strain: Not on file  Food Insecurity: Not on file  Transportation Needs: Not on file  Physical Activity: Not on file  Stress: Not on file  Social Connections: Not on file  Intimate Partner Violence: Not on file      PHYSICAL EXAM  There were no vitals filed for this visit. There is no height or weight on file to calculate BMI.   Generalized: Well developed, in no acute distress  Cardiology: normal rate and rhythm, no murmur auscultated  Respiratory: clear to auscultation bilaterally    Neurological examination  Mentation: Alert oriented to time, place, history taking. Follows all commands speech and language fluent Cranial nerve II-XII: Pupils were equal round reactive to light. Extraocular movements were full, visual field were full on confrontational test. Facial sensation and strength were normal. Uvula tongue midline. Head turning and shoulder shrug  were normal and symmetric. Motor: The motor testing reveals 5 over 5 strength of all 4 extremities. Good symmetric motor tone is noted throughout.  Sensory: Sensory testing is intact to soft touch on all 4 extremities. No evidence of extinction is noted.  Coordination: Cerebellar testing reveals good finger-nose-finger and heel-to-shin bilaterally.  Gait and station: Gait is normal. Tandem gait is normal. Romberg is negative. No drift is seen.  Reflexes: Deep tendon reflexes are symmetric and normal bilaterally.     DIAGNOSTIC DATA (LABS, IMAGING,  TESTING) - I reviewed patient records, labs, notes, testing and imaging myself where available.  Lab Results  Component Value Date   WBC 13.0 (H) 01/13/2021   HGB 10.2 (L) 01/13/2021   HCT 30.5 (L) 01/13/2021   MCV 89 01/13/2021   PLT 317 01/13/2021      Component Value Date/Time   NA 145 12/15/2020 0225   NA 146 (H) 09/17/2020 1438   K 3.3 (L) 12/15/2020 0225   CL 113 (H) 12/15/2020 0225   CO2 20 (L) 12/15/2020 0225   GLUCOSE 142 (H) 12/15/2020 0225   BUN 30 (H) 12/15/2020 0225   BUN 31 (H) 09/17/2020 1438   CREATININE 0.94 12/15/2020 0225   CALCIUM 8.3 (L) 12/15/2020 0225   PROT 6.4 (L) 12/13/2020 1635   PROT 6.2 09/17/2020 1438   ALBUMIN 3.0 (L) 12/13/2020 1635   ALBUMIN 3.8 09/17/2020 1438   AST 17 12/13/2020 1635   ALT 14 12/13/2020 1635   ALKPHOS 53 12/13/2020 1635   BILITOT 0.4 12/13/2020 1635   BILITOT 0.4 09/17/2020  [place] (L) 12/15/2020 0225   GFRAA 78 09/17/2020 1438   Lab Results  Component Value Date   CHOL 186 12/14/2020   HDL 45 12/14/2020   LDLCALC 127 (H) 12/14/2020   TRIG 68 12/14/2020   CHOLHDL 4.1 12/14/2020   Lab Results  Component Value Date   HGBA1C 6.8 (H) 12/14/2020   No results found for: [id number] Lab Results  Component Value Date   TSH 0.564 12/14/2020    No flowsheet data found.   No flowsheet data found.   ASSESSMENT AND PLAN  87 y.o. year old female  has a past medical history of Diabetes ([place]), Hypertension, and Nonrheumatic aortic (valve) stenosis (09/21/2020). here with     No diagnosis found.    No orders of the defined types were placed in this encounter.    No orders of the defined types were placed in this encounter.     I spent 20 minutes of face-to-face and non-face-to-face time with patient.  This included previsit chart review, lab review, study review, order entry, electronic health record documentation, patient education.    Debbora Presto, MSN, FNP-C 05/25/2021, 12:15 PM  [facility] [address], [place], [place] [address] [phone]

## 2021-10-07 ENCOUNTER — Ambulatory Visit (INDEPENDENT_AMBULATORY_CARE_PROVIDER_SITE_OTHER): Payer: Medicare PPO | Admitting: Family Medicine

## 2021-10-07 ENCOUNTER — Encounter: Payer: Self-pay | Admitting: Family Medicine

## 2021-10-07 ENCOUNTER — Other Ambulatory Visit: Payer: Self-pay

## 2021-10-07 VITALS — BP 100/60 | HR 110 | Temp 96.1°F | Resp 16 | Ht 66.0 in | Wt 142.0 lb

## 2021-10-07 DIAGNOSIS — N3000 Acute cystitis without hematuria: Secondary | ICD-10-CM | POA: Diagnosis not present

## 2021-10-07 LAB — POCT URINALYSIS DIP (CLINITEK)
Blood, UA: NEGATIVE
Glucose, UA: NEGATIVE mg/dL
Ketones, POC UA: NEGATIVE mg/dL
Nitrite, UA: POSITIVE — AB
POC PROTEIN,UA: 30 — AB
Spec Grav, UA: 1.025 (ref 1.010–1.025)
Urobilinogen, UA: 0.2 E.U./dL
pH, UA: 6 (ref 5.0–8.0)

## 2021-10-07 MED ORDER — CIPROFLOXACIN HCL 250 MG PO TABS: 250.0000 mg | ORAL_TABLET | Freq: Two times a day (BID) | ORAL | 0 refills | Status: AC

## 2021-10-07 NOTE — Patient Instructions (Signed)
Cipro 250 mg one twice a day x 1 week.

## 2021-10-07 NOTE — Progress Notes (Signed)
Acute Office Visit  Subjective:    Patient ID: Christine Newton, female    DOB: [birth year], 87 y.o.   MRN: [id number]  Chief Complaint  Patient presents with   Urinary Tract Infection    HPI Patient is in today for urine check because her nurse thought that she has a UTI due the smell of the urine. Patient denied fever, suprapubic pain, nausea, vomit or any other symptoms.  Past Medical History:  Diagnosis Date   Diabetes ([place])    Hypertension    Nonrheumatic aortic (valve) stenosis 09/21/2020    Past Surgical History:  Procedure Laterality Date   CARDIAC VALVE REPLACEMENT     CHOLECYSTECTOMY      No family history on file.  Social History   Socioeconomic History   Marital status: Widowed    Spouse name: Not on file   Number of children: Not on file   Years of education: Not on file   Highest education level: Not on file  Occupational History   Not on file  Tobacco Use   Smoking status: Never   Smokeless tobacco: Never  Substance and Sexual Activity   Alcohol use: Never   Drug use: Never   Sexual activity: Not Currently  Other Topics Concern   Not on file  Social History Narrative   Not on file   Social Determinants of Health   Financial Resource Strain: Not on file  Food Insecurity: Not on file  Transportation Needs: Not on file  Physical Activity: Not on file  Stress: Not on file  Social Connections: Not on file  Intimate Partner Violence: Not on file    Outpatient Medications Prior to Visit  Medication Sig Dispense Refill   ACCU-CHEK GUIDE test strip 1 [place] 2 (TWO) TIMES DAILY AS NEEDED. USE AS INSTRUCTED 200 strip 2   Accu-Chek Softclix Lancets lancets 1 EACH BY DOES NOT APPLY ROUTE 3 (THREE) TIMES DAILY. 100 each 2   aspirin EC 81 MG tablet Take 1 tablet (81 mg total) by mouth daily. Swallow whole. 30 tablet 11   atorvastatin (LIPITOR) 20 MG tablet Take 1 tablet (20 mg total) by mouth daily. 90 tablet 3   Blood Glucose  Monitoring Suppl (ACCU-CHEK GUIDE) w/Device KIT 1 each by Does not apply route 2 (two) times daily as needed. 1 kit 0   Lancets Misc. (ACCU-CHEK SOFTCLIX LANCET DEV) KIT 1 each by Does not apply route 3 (three) times daily. 1 kit 0   lisinopril (ZESTRIL) 20 MG tablet TAKE 1 TABLET BY MOUTH EVERY DAY 90 tablet 2   metFORMIN (GLUCOPHAGE) 500 MG tablet Take 500 mg by mouth daily with breakfast.     No facility-administered medications prior to visit.    Allergies  Allergen Reactions   Cephalosporins Nausea Only and Other (See Comments)    Other Reaction: Allergy    Review of Systems  Constitutional:  Negative for chills, fatigue and fever.  HENT:  Negative for congestion, ear pain and sore throat.   Respiratory:  Negative for cough and shortness of breath.   Cardiovascular:  Negative for chest pain and palpitations.  Gastrointestinal:  Negative for abdominal pain, constipation, diarrhea, nausea and vomiting.  Endocrine: Negative for polydipsia, polyphagia and polyuria.  Genitourinary:  Negative for difficulty urinating, dysuria, frequency and urgency.  Musculoskeletal:  Negative for arthralgias, back pain and myalgias.  Skin:  Negative for rash.  Neurological:  Positive for weakness (Patient uses a walker). Negative for headaches.  Psychiatric/Behavioral:  Negative for dysphoric mood. The patient is not nervous/anxious.       Objective:    Physical Exam Vitals reviewed.  Constitutional:      Appearance: Normal appearance. She is normal weight.  Cardiovascular:     Rate and Rhythm: Normal rate and regular rhythm.     Heart sounds: Normal heart sounds.  Pulmonary:     Effort: Pulmonary effort is normal. No respiratory distress.     Breath sounds: Normal breath sounds.  Abdominal:     General: Abdomen is flat. Bowel sounds are normal.     Palpations: Abdomen is soft.     Tenderness: There is no abdominal tenderness.  Neurological:     Mental Status: She is alert and oriented to  person, place, and time.  Psychiatric:        Mood and Affect: Mood normal.        Behavior: Behavior normal.    BP 100/60   Pulse (!) 110   Temp (!) 96.1 F (35.6 C)   Resp 16   Ht _0  (1.676 m)   Wt 142 lb (64.4 kg)   SpO2 97%   BMI 22.92 kg/m  Wt Readings from Last 3 Encounters:  10/07/21 142 lb (64.4 kg)  02/19/21 143 lb 3.2 oz (65 kg)  01/21/21 137 lb 9.6 oz (62.4 kg)    Health Maintenance Due  Topic Date Due   OPHTHALMOLOGY EXAM  Never done   TETANUS/TDAP  Never done   Zoster Vaccines- Shingrix (1 of 2) Never done   DEXA SCAN  Never done   HEMOGLOBIN A1C  06/14/2021   INFLUENZA VACCINE  07/26/2021   FOOT EXAM  09/17/2021    There are no preventive care reminders to display for this patient.   Lab Results  Component Value Date   TSH 0.564 12/14/2020   Lab Results  Component Value Date   WBC 13.0 (H) 01/13/2021   HGB 10.2 (L) 01/13/2021   HCT 30.5 (L) 01/13/2021   MCV 89 01/13/2021   PLT 317 01/13/2021   Lab Results  Component Value Date   NA 145 12/15/2020   K 3.3 (L) 12/15/2020   CO2 20 (L) 12/15/2020   GLUCOSE 142 (H) 12/15/2020   BUN 30 (H) 12/15/2020   CREATININE 0.94 12/15/2020   BILITOT 0.4 12/13/2020   ALKPHOS 53 12/13/2020   AST 17 12/13/2020   ALT 14 12/13/2020   PROT 6.4 (L) 12/13/2020   ALBUMIN 3.0 (L) 12/13/2020   CALCIUM 8.3 (L) 12/15/2020   ANIONGAP 12 12/15/2020   Lab Results  Component Value Date   CHOL 186 12/14/2020   Lab Results  Component Value Date   HDL 45 12/14/2020   Lab Results  Component Value Date   LDLCALC 127 (H) 12/14/2020   Lab Results  Component Value Date   TRIG 68 12/14/2020   Lab Results  Component Value Date   CHOLHDL 4.1 12/14/2020   Lab Results  Component Value Date   HGBA1C 6.8 (H) 12/14/2020       Assessment & Plan:   Problem List Items Addressed This Visit       Genitourinary   Acute cystitis without hematuria - Primary    Cipro 250 mg 1 twice daily for 1 week. Patient is  due for follow-up with Dr. Henrene Pastor      Relevant Orders   POCT URINALYSIS DIP (CLINITEK) (Completed)   Urine Culture   Meds ordered this encounter  Medications  ciprofloxacin (CIPRO) 250 MG tablet    Sig: Take 1 tablet (250 mg total) by mouth 2 (two) times daily for 7 days.    Dispense:  14 tablet    Refill:  0    Orders Placed This Encounter  Procedures   Urine Culture   POCT URINALYSIS DIP (CLINITEK)     Follow-up: Return in about 3 weeks (around 10/28/2021) for chronic fasting.  An After Visit Summary was printed and given to the patient.  Rochel Brome, MD [facility] [phone]

## 2021-10-07 NOTE — Assessment & Plan Note (Signed)
Cipro 250 mg 1 twice daily for 1 week. Patient is due for follow-up with Dr. Henrene Pastor

## 2021-10-08 DIAGNOSIS — I10 Essential (primary) hypertension: Secondary | ICD-10-CM | POA: Diagnosis not present

## 2021-10-08 DIAGNOSIS — N390 Urinary tract infection, site not specified: Secondary | ICD-10-CM | POA: Diagnosis not present

## 2021-10-08 DIAGNOSIS — F0390 Unspecified dementia without behavioral disturbance: Secondary | ICD-10-CM | POA: Diagnosis not present

## 2021-10-08 DIAGNOSIS — R32 Unspecified urinary incontinence: Secondary | ICD-10-CM | POA: Diagnosis not present

## 2021-10-08 DIAGNOSIS — E119 Type 2 diabetes mellitus without complications: Secondary | ICD-10-CM | POA: Diagnosis not present

## 2021-10-08 DIAGNOSIS — Z7982 Long term (current) use of aspirin: Secondary | ICD-10-CM | POA: Diagnosis not present

## 2021-10-08 DIAGNOSIS — E785 Hyperlipidemia, unspecified: Secondary | ICD-10-CM | POA: Diagnosis not present

## 2021-10-08 DIAGNOSIS — I2510 Atherosclerotic heart disease of native coronary artery without angina pectoris: Secondary | ICD-10-CM | POA: Diagnosis not present

## 2021-10-08 DIAGNOSIS — Z7409 Other reduced mobility: Secondary | ICD-10-CM | POA: Diagnosis not present

## 2021-10-21 LAB — URINE CULTURE

## 2021-11-01 ENCOUNTER — Encounter: Payer: Self-pay | Admitting: Legal Medicine

## 2021-11-01 ENCOUNTER — Other Ambulatory Visit: Payer: Self-pay

## 2021-11-01 ENCOUNTER — Ambulatory Visit (INDEPENDENT_AMBULATORY_CARE_PROVIDER_SITE_OTHER): Payer: Medicare PPO | Admitting: Legal Medicine

## 2021-11-01 VITALS — BP 130/60 | HR 98 | Temp 97.6°F | Resp 16 | Ht 66.0 in | Wt 143.0 lb

## 2021-11-01 DIAGNOSIS — E1159 Type 2 diabetes mellitus with other circulatory complications: Secondary | ICD-10-CM

## 2021-11-01 DIAGNOSIS — N3000 Acute cystitis without hematuria: Secondary | ICD-10-CM

## 2021-11-01 DIAGNOSIS — E1169 Type 2 diabetes mellitus with other specified complication: Secondary | ICD-10-CM | POA: Diagnosis not present

## 2021-11-01 DIAGNOSIS — D352 Benign neoplasm of pituitary gland: Secondary | ICD-10-CM | POA: Diagnosis not present

## 2021-11-01 DIAGNOSIS — I152 Hypertension secondary to endocrine disorders: Secondary | ICD-10-CM

## 2021-11-01 DIAGNOSIS — F0390 Unspecified dementia without behavioral disturbance: Secondary | ICD-10-CM | POA: Insufficient documentation

## 2021-11-01 DIAGNOSIS — I10 Essential (primary) hypertension: Secondary | ICD-10-CM

## 2021-11-01 DIAGNOSIS — E782 Mixed hyperlipidemia: Secondary | ICD-10-CM

## 2021-11-01 DIAGNOSIS — E669 Obesity, unspecified: Secondary | ICD-10-CM | POA: Diagnosis not present

## 2021-11-01 DIAGNOSIS — F03B0 Unspecified dementia, moderate, without behavioral disturbance, psychotic disturbance, mood disturbance, and anxiety: Secondary | ICD-10-CM

## 2021-11-01 LAB — POCT URINALYSIS DIPSTICK
Bilirubin, UA: NEGATIVE
Blood, UA: NEGATIVE
Glucose, UA: NEGATIVE
Ketones, UA: NEGATIVE
Nitrite, UA: NEGATIVE
Protein, UA: NEGATIVE
Spec Grav, UA: 1.015 (ref 1.010–1.025)
Urobilinogen, UA: 0.2 E.U./dL
pH, UA: 6 (ref 5.0–8.0)

## 2021-11-01 NOTE — Progress Notes (Addendum)
Subjective:  Patient ID: Christine Newton, female    DOB: 1933-11-03  Age: 87 y.o. MRN: [id number]  Chief Complaint  Patient presents with   Diabetes   Hyperlipidemia   Hypertension    HPI: Chronic follow up uses walker  Patient present with type 2 diabetes.  Specifically, this is type 2, noninsulin requiring diabetes, complicated by hypertension and hypercholesterolemia.  Compliance with treatment has been good; patient take medicines as directed, maintains diet and exercise regimen, follows up as directed, and is keeping glucose diary.  Date of  diagnosis 2010.  Depression screen has been performed.Tobacco screen nonsmoker. Current medicines for diabetes metformin.  Patient is on lisinopril for renal protection and atorvstatin for cholesterol control.  Patient performs foot exams daily and last ophthalmologic exam was need eye exam.   Patient presents for follow up of hypertension.  Patient tolerating lisinopril well with side effects.  Patient was diagnosed with hypertension 2010 so has been treated for hypertension for 12 years.Patient is working on maintaining diet and exercise regimen and follows up as directed. Complication include none.   Patient presents with hyperlipidemia.  Compliance with treatment has been good; patient takes medicines as directed, maintains low cholesterol diet, follows up as directed, and maintains exercise regimen.  Patient is using atorvastatin without problems.    Current Outpatient Medications on File Prior to Visit  Medication Sig Dispense Refill   ACCU-CHEK GUIDE test strip 1 EACH BY OTHER ROUTE 2 (TWO) TIMES DAILY AS NEEDED. USE AS INSTRUCTED 200 strip 2   Accu-Chek Softclix Lancets lancets 1 EACH BY DOES NOT APPLY ROUTE 3 (THREE) TIMES DAILY. 100 each 2   aspirin EC 81 MG tablet Take 1 tablet (81 mg total) by mouth daily. Swallow whole. 30 tablet 11   atorvastatin (LIPITOR) 20 MG tablet Take 1 tablet (20 mg total) by mouth daily. 90 tablet 3    Blood Glucose Monitoring Suppl (ACCU-CHEK GUIDE) w/Device KIT 1 each by Does not apply route 2 (two) times daily as needed. 1 kit 0   Lancets Misc. (ACCU-CHEK SOFTCLIX LANCET DEV) KIT 1 each by Does not apply route 3 (three) times daily. 1 kit 0   lisinopril (ZESTRIL) 20 MG tablet TAKE 1 TABLET BY MOUTH EVERY DAY 90 tablet 2   metFORMIN (GLUCOPHAGE) 500 MG tablet Take 500 mg by mouth daily with breakfast.     No current facility-administered medications on file prior to visit.   Past Medical History:  Diagnosis Date   Diabetes ([place])    Hypertension    Nonrheumatic aortic (valve) stenosis 09/21/2020   Past Surgical History:  Procedure Laterality Date   CARDIAC VALVE REPLACEMENT     CHOLECYSTECTOMY      History reviewed. No pertinent family history. Social History   Socioeconomic History   Marital status: Widowed    Spouse name: Not on file   Number of children: Not on file   Years of education: Not on file   Highest education level: Not on file  Occupational History   Not on file  Tobacco Use   Smoking status: Never   Smokeless tobacco: Never  Substance and Sexual Activity   Alcohol use: Never   Drug use: Never   Sexual activity: Not Currently  Other Topics Concern   Not on file  Social History Narrative   Not on file   Social Determinants of Health   Financial Resource Strain: Not on file  Food Insecurity: Not on file  Transportation Needs: Not on  file  Physical Activity: Not on file  Stress: Not on file  Social Connections: Not on file    Review of Systems  Constitutional:  Negative for chills, fatigue and fever.  HENT:  Negative for congestion, ear pain and sore throat.   Respiratory:  Negative for cough and shortness of breath.   Cardiovascular:  Negative for chest pain and palpitations.  Gastrointestinal:  Negative for abdominal pain, constipation, diarrhea, nausea and vomiting.  Endocrine: Negative for polydipsia, polyphagia and polyuria.  Genitourinary:   Negative for difficulty urinating and dysuria.  Musculoskeletal:  Negative for arthralgias, back pain and myalgias.  Skin:  Negative for rash.  Neurological:  Negative for headaches.  Psychiatric/Behavioral:  Negative for dysphoric mood. The patient is not nervous/anxious.     Objective:  BP 130/60   Pulse 98   Temp 97.6 F (36.4 C)   Resp 16   Ht '5\' 6"'  (1.676 m)   Wt 143 lb (64.9 kg)   SpO2 99%   BMI 23.08 kg/m   BP/Weight 11/01/2021 10/07/2021 7/74/1287  Systolic BP 867 672 094  Diastolic BP 60 60 88  Wt. (Lbs) 143 142 143.2  BMI 23.08 22.92 23.11    Physical Exam Vitals reviewed.  Constitutional:      General: She is not in acute distress.    Appearance: Normal appearance.  HENT:     Head: Normocephalic.     Right Ear: Tympanic membrane, ear canal and external ear normal.     Left Ear: Tympanic membrane, ear canal and external ear normal.     Mouth/Throat:     Mouth: Mucous membranes are moist.  Eyes:     Extraocular Movements: Extraocular movements intact.     Conjunctiva/sclera: Conjunctivae normal.     Pupils: Pupils are equal, round, and reactive to light.  Cardiovascular:     Rate and Rhythm: Normal rate and regular rhythm.     Pulses: Normal pulses.     Heart sounds: Normal heart sounds. No murmur heard.   No gallop.  Pulmonary:     Effort: Pulmonary effort is normal. No respiratory distress.     Breath sounds: Normal breath sounds. No wheezing.  Abdominal:     General: Abdomen is flat. Bowel sounds are normal. There is no distension.     Palpations: Abdomen is soft.     Tenderness: There is no abdominal tenderness.  Musculoskeletal:        General: Normal range of motion.     Cervical back: Normal range of motion.     Right lower leg: No edema.     Left lower leg: No edema.  Skin:    General: Skin is warm.     Capillary Refill: Capillary refill takes less than 2 seconds.  Neurological:     General: No focal deficit present.     Mental Status:  She is alert and oriented to person, place, and time.     Motor: Weakness (general) present.  Psychiatric:        Mood and Affect: Mood normal.        Thought Content: Thought content normal.        Judgment: Judgment normal.    Diabetic Foot Exam - Simple   Simple Foot Form Diabetic Foot exam was performed with the following findings: Yes 11/01/2021 10:17 AM  Visual Inspection See comments: Yes Sensation Testing Intact to touch and monofilament testing bilaterally: Yes Pulse Check Posterior Tibialis and Dorsalis pulse intact bilaterally: Yes Comments bunion  MMSE 15/30  Lab Results  Component Value Date   WBC 13.0 (H) 01/13/2021   HGB 10.2 (L) 01/13/2021   HCT 30.5 (L) 01/13/2021   PLT 317 01/13/2021   GLUCOSE 142 (H) 12/15/2020   CHOL 186 12/14/2020   TRIG 68 12/14/2020   HDL 45 12/14/2020   LDLCALC 127 (H) 12/14/2020   ALT 14 12/13/2020   AST 17 12/13/2020   NA 145 12/15/2020   K 3.3 (L) 12/15/2020   CL 113 (H) 12/15/2020   CREATININE 0.94 12/15/2020   BUN 30 (H) 12/15/2020   CO2 20 (L) 12/15/2020   TSH 0.564 12/14/2020   INR 1.0 12/13/2020   HGBA1C 6.8 (H) 12/14/2020      Assessment & Plan:   Problem List Items Addressed This Visit       Cardiovascular and Mediastinum   Essential hypertension   Relevant Orders   Comprehensive metabolic panel   CBC with Differential/Platelet   Ambulatory referral to [facility] An individual hypertension care plan was established and reinforced today.  The patient's status was assessed using clinical findings on exam and labs or diagnostic tests. The patient's success at meeting treatment goals on disease specific evidence-based guidelines and found to be well controlled. SELF MANAGEMENT: The patient and I together assessed ways to personally work towards obtaining the recommended goals. RECOMMENDATIONS: avoid decongestants found in common cold remedies, decrease consumption of alcohol, perform routine monitoring of BP  with home BP cuff, exercise, reduction of dietary salt, take medicines as prescribed, try not to miss doses and quit smoking.  Regular exercise and maintaining a healthy weight is needed.  Stress reduction may help. A CLINICAL SUMMARY including written plan identify barriers to care unique to individual due to social or financial issues.  We attempt to mutually creat solutions for individual and family understanding.     Obesity, diabetes, and hypertension syndrome ([place]) - Primary   Relevant Orders   Hemoglobin A1c   Ambulatory referral to [place] An individual care plan for diabetes was established and reinforced today.  The patient's status was assessed using clinical findings on exam, labs and diagnostic testing. Patient success at meeting goals based on disease specific evidence-based guidelines and found to be good controlled. Medications were assessed and patient's understanding of the medical issues , including barriers were assessed. Recommend adherence to a diabetic diet, a graduated exercise program, HgbA1c level is checked quarterly, and urine microalbumin performed yearly .  Annual mono-filament sensation testing performed. Lower blood pressure and control hyperlipidemia is important. Get annual eye exams and annual flu shots and smoking cessation discussed.  Self management goals were discussed.      Endocrine   Adenoma of pituitary [facility])   Relevant Orders   Ambulatory referral to Home Health No changes and neurol0gist feels it is stable     Nervous and Auditory   Dementia ([place])    MMSE 15/30 on 11/01/2021 Patient in new living facility and forgets medicines, we discussed using pill minders, family does not want new dementia medicines, I reffered them to [email or web address], we discussed [facility]        Genitourinary   Acute cystitis without hematuria   Relevant Orders   POCT urinalysis dipstick (Completed)   Urine Culture UTI with pyuria, culture first since no symptoms      Other   Hyperlipidemia   Relevant Orders   Lipid panel   TSH   Ambulatory referral to [place] for hyperlipidemia/ cholesterol  was established and reinforced today.  The patient's status was assessed using clinical findings on exam, lab and other diagnostic tests. The patient's disease status was assessed based on evidence-based guidelines and found to be well controlled. MEDICATIONS were reviewed. SELF MANAGEMENT GOALS have been discussed and patient's success at attaining the goal of low cholesterol was assessed. RECOMMENDATION given include regular exercise 3 days a week and low cholesterol/low fat diet. CLINICAL SUMMARY including written plan to identify barriers unique to the patient due to social or economic  reasons was discussed.   .     Orders Placed This Encounter  Procedures   Urine Culture   Comprehensive metabolic panel   Lipid panel   Hemoglobin A1c   TSH   CBC with Differential/Platelet   Ambulatory referral to [place]   POCT urinalysis dipstick   30 plus minute visit, with review or old records and evaluation of dementia  Follow-up: Return in about 4 months (around 03/01/2022) for fasting.  An After Visit Summary was printed and given to the patient.  Reinaldo Meeker, MD [name] [phone]

## 2021-11-01 NOTE — Patient Instructions (Signed)
Alzheimer's Disease Caregiver Guide Alzheimer's disease is a condition that makes a person: Forget things. Act differently. Have trouble paying attention and doing simple tasks. These things get worse with time. The tips below can help you care for the person. How to help manage lifestyle changes Tips to help with symptoms Be calm and patient. Give simple, short answers to questions. Avoid correcting the person in a negative way. Try not to take things personally, even if the person forgets your name. Do not argue with the person. This may make the person more upset. Tips to lessen frustration Make appointments and do daily tasks when the person is at his or her best. Take your time. Simple tasks may take longer. Allow plenty of time to complete tasks. Limit choices for the person. Involve the person in what you are doing. Keep things organized: Keep a daily routine. Organize medicines in a pillbox for each day of the week. Keep a calendar in a central location to remind the person of meetings or other activities. Avoid new or crowded places, if possible. Use simple words, short sentences, and a calm voice. Only give one direction at a time. Buy clothes and shoes that are easy to put on and take off. Try to change the subject if the person becomes frustrated or angry. Tips to prevent injury  Keep floors clear. Remove rugs, magazine racks, and floor lamps. Keep hallways well-lit. Put a handrail and non-slip mat in the bathtub or shower. Put childproof locks on cabinets that have dangerous items in them. These items include medicine, alcohol, guns, toxic cleaning items, sharp tools, matches, and lighters. Put locks on doors where the person cannot see or reach them. This helps keep the person from going out of the house and getting lost. Be ready for emergencies. Keep a list of emergency phone numbers and addresses close by. Remove car keys and lock garage doors so that the person  does not try to drive. Bracelets may be worn that track location and identify the person as having memory problems. This should be worn at all times for safety. Tips for the future  Discuss financial and legal planning early. People with this disease have trouble managing their money as the disease gets worse. Get help from a professional. Talk about advance directives, safety, and daily care. Take these steps: Create a living will and choose a power of attorney. This is someone who can make decisions for the person with Alzheimer's disease when he or she can no longer do so. Discuss driving safety and when to stop driving. The person's doctor can help with this. If the person lives alone, make sure he or she is safe. Some people need extra help at home. Other people need more care at a nursing home or care center. How to recognize changes in the person's condition With this disease, memory problems and confusion slowly get worse. In time, the person may not know his or her friends and family members. The disease can also cause changes in behavior and mood, such as anxiety or anger. The person may see, hear, taste, smell, or feel things that are not real (hallucinate). These changes can come on all of a sudden. They may happen in response to something such as: Pain. An infection. Changes in temperature or noise. Too much stimulation. Feeling lost or scared. Medicines. Where to find support Find out about services that can provide short-term care (respite care). These can allow you to take a break when  you need it. Join a support group near you. These groups can help you: Learn ways to manage stress. Share experiences with others. Get emotional comfort and support. Learn about caregiving as the disease gets worse. Know what community resources are available. Where to find more information Alzheimer's Association: [employer] Contact a doctor if: The person has a fever. The person has a  sudden behavior change that does not get better with calming strategies. The person is not able to take care of himself or herself at home. You are no longer able to care for the person. Get help right away if: The person has a sudden increase in confusion or new hallucinations. The person threatens you or anyone else, including himself or herself. Get help right away if you feel like your loved one may hurt himself or herself or others, or has thoughts about taking his or her own life. Go to your nearest emergency room or: Call your local emergency services (911 in the [place] Call the [place] [facility] at 4138816927 or 988 in the [place] This is open 24 hours a day. Text the Crisis Text Line at 407-190-3547. Summary Alzheimer's disease causes a person to forget things. A person who has this condition may have trouble doing simple tasks. Take steps to keep the person from getting hurt. Plan for future care. You can find support by joining a support group near you. This information is not intended to replace advice given to you by your health care provider. Make sure you discuss any questions you have with your health care provider. Document Revised: 07/07/2021 Document Reviewed: 03/30/2020 Elsevier Patient Education  2022 [employer].

## 2021-11-01 NOTE — Assessment & Plan Note (Signed)
MMSE 15/30 on 11/01/2021

## 2021-11-02 LAB — CBC WITH DIFFERENTIAL/PLATELET
Basophils Absolute: 0.1 10*3/uL (ref 0.0–0.2)
Basos: 1 %
EOS (ABSOLUTE): 0.2 10*3/uL (ref 0.0–0.4)
Eos: 2 %
Hematocrit: 41.7 % (ref 34.0–46.6)
Hemoglobin: 13.3 g/dL (ref 11.1–15.9)
Immature Grans (Abs): 0.3 10*3/uL — ABNORMAL HIGH (ref 0.0–0.1)
Immature Granulocytes: 3 %
Lymphocytes Absolute: 2.2 10*3/uL (ref 0.7–3.1)
Lymphs: 25 %
MCH: 28 pg (ref 26.6–33.0)
MCHC: 31.9 g/dL (ref 31.5–35.7)
MCV: 88 fL (ref 79–97)
Monocytes Absolute: 0.5 10*3/uL (ref 0.1–0.9)
Monocytes: 6 %
Neutrophils Absolute: 5.5 10*3/uL (ref 1.4–7.0)
Neutrophils: 63 %
Platelets: 202 10*3/uL (ref 150–450)
RBC: 4.75 x10E6/uL (ref 3.77–5.28)
RDW: 14.6 % (ref 11.7–15.4)
WBC: 8.8 10*3/uL (ref 3.4–10.8)

## 2021-11-02 LAB — COMPREHENSIVE METABOLIC PANEL
ALT: 17 IU/L (ref 0–32)
AST: 13 IU/L (ref 0–40)
Albumin/Globulin Ratio: 1.2 (ref 1.2–2.2)
Albumin: 3.6 g/dL (ref 3.6–4.6)
Alkaline Phosphatase: 75 IU/L (ref 44–121)
BUN/Creatinine Ratio: 24 (ref 12–28)
BUN: 22 mg/dL (ref 8–27)
Bilirubin Total: 0.5 mg/dL (ref 0.0–1.2)
CO2: 22 mmol/L (ref 20–29)
Calcium: 8.8 mg/dL (ref 8.7–10.3)
Chloride: 108 mmol/L — ABNORMAL HIGH (ref 96–106)
Creatinine, Ser: 0.93 mg/dL (ref 0.57–1.00)
Globulin, Total: 3 g/dL (ref 1.5–4.5)
Glucose: 95 mg/dL (ref 70–99)
Potassium: 3.9 mmol/L (ref 3.5–5.2)
Sodium: 143 mmol/L (ref 134–144)
Total Protein: 6.6 g/dL (ref 6.0–8.5)
eGFR: 60 mL/min/{1.73_m2} (ref >59)

## 2021-11-02 LAB — LIPID PANEL
Chol/HDL Ratio: 4 ratio (ref 0.0–4.4)
Cholesterol, Total: 187 mg/dL (ref 100–199)
HDL: 47 mg/dL (ref >39)
LDL Chol Calc (NIH): 122 mg/dL — ABNORMAL HIGH (ref 0–99)
Triglycerides: 100 mg/dL (ref 0–149)
VLDL Cholesterol Cal: 18 mg/dL (ref 5–40)

## 2021-11-02 LAB — HEMOGLOBIN A1C
Est. average glucose Bld gHb Est-mCnc: 146 mg/dL
Hgb A1c MFr Bld: 6.7 % — ABNORMAL HIGH (ref 4.8–5.6)

## 2021-11-02 LAB — CARDIOVASCULAR RISK ASSESSMENT

## 2021-11-02 LAB — TSH: TSH: 1.01 u[IU]/mL (ref 0.450–4.500)

## 2021-11-02 NOTE — Progress Notes (Signed)
Kidney and liver tests normal, LDL cholesterol 122 high, A1c 6.7 good, TSH 1.010 normal, cbc normal lp

## 2021-11-07 ENCOUNTER — Other Ambulatory Visit: Payer: Self-pay | Admitting: Legal Medicine

## 2021-11-07 LAB — URINE CULTURE

## 2021-11-07 MED ORDER — CIPROFLOXACIN HCL 500 MG PO TABS: 500.0000 mg | ORAL_TABLET | Freq: Two times a day (BID) | ORAL | 0 refills | Status: AC

## 2021-11-07 NOTE — Progress Notes (Unsigned)
cipro

## 2021-11-08 ENCOUNTER — Telehealth: Payer: Self-pay

## 2021-11-08 NOTE — Telephone Encounter (Signed)
Roselie Awkward PT w/ [facility] calling for verbal orders. Requests POC frequency of once a week for one week, twice a week for one week, once a week for one week, twice a week for three weeks, and once a week for one week.   Verbal orders given.   Royce Macadamia, [place] 11/08/21 8:46 AM

## 2021-11-15 ENCOUNTER — Other Ambulatory Visit: Payer: Self-pay

## 2021-11-15 ENCOUNTER — Telehealth: Payer: Self-pay | Admitting: Interventional Cardiology

## 2021-11-15 DIAGNOSIS — E782 Mixed hyperlipidemia: Secondary | ICD-10-CM | POA: Diagnosis not present

## 2021-11-15 DIAGNOSIS — F03B0 Unspecified dementia, moderate, without behavioral disturbance, psychotic disturbance, mood disturbance, and anxiety: Secondary | ICD-10-CM | POA: Diagnosis not present

## 2021-11-15 DIAGNOSIS — E1159 Type 2 diabetes mellitus with other circulatory complications: Secondary | ICD-10-CM | POA: Diagnosis not present

## 2021-11-15 DIAGNOSIS — D352 Benign neoplasm of pituitary gland: Secondary | ICD-10-CM | POA: Diagnosis not present

## 2021-11-15 DIAGNOSIS — Z87440 Personal history of urinary (tract) infections: Secondary | ICD-10-CM

## 2021-11-15 DIAGNOSIS — Z7984 Long term (current) use of oral hypoglycemic drugs: Secondary | ICD-10-CM | POA: Diagnosis not present

## 2021-11-15 DIAGNOSIS — E669 Obesity, unspecified: Secondary | ICD-10-CM | POA: Diagnosis not present

## 2021-11-15 DIAGNOSIS — Z952 Presence of prosthetic heart valve: Secondary | ICD-10-CM

## 2021-11-15 DIAGNOSIS — Z9181 History of falling: Secondary | ICD-10-CM

## 2021-11-15 DIAGNOSIS — I152 Hypertension secondary to endocrine disorders: Secondary | ICD-10-CM | POA: Diagnosis not present

## 2021-11-15 DIAGNOSIS — Z6823 Body mass index (BMI) 23.0-23.9, adult: Secondary | ICD-10-CM | POA: Diagnosis not present

## 2021-11-15 DIAGNOSIS — Z7982 Long term (current) use of aspirin: Secondary | ICD-10-CM | POA: Diagnosis not present

## 2021-11-15 NOTE — Telephone Encounter (Signed)
Left message to call back  

## 2021-11-15 NOTE — Telephone Encounter (Signed)
STAT if patient feels like he/she is going to faint   Are you dizzy now? NO  Do you feel faint or have you passed out? Has felt faint, but hasn't passed out.   Do you have any other symptoms? Sharp stabbing pain left side of breast   Have you checked your HR and BP (record if available)? 159/97, 168/106, 176/101, 147/89, 125/79, 100/72, 105/75, 100/72  Complains about her legs feeling heavy, BP tends to be higher in the morning. They had the ambulance come on Friday they said it was probably a pulled muscle.  These faint spell have been going on for about a month, she had about 3 of them.

## 2021-11-17 ENCOUNTER — Other Ambulatory Visit: Payer: Self-pay

## 2021-11-17 DIAGNOSIS — Z1231 Encounter for screening mammogram for malignant neoplasm of breast: Secondary | ICD-10-CM

## 2021-11-17 NOTE — Telephone Encounter (Signed)
Attempted to contact daughter.  VM is now full.  Unable to leave message.

## 2021-11-17 NOTE — Telephone Encounter (Signed)
Spoke with daughter and she states it was actually the pt's nurse that contact the office.  She states the dizziness was an isolated incident that occurred while the daughter was on vacation.  She normally fixes pt's food but pt's sister was helping while she was away.  States elevated BPs are prior to meds and then lower numbers are after meds.  This morning BP was 138/86.  Pt is currently in PT and they recently changed up her activities.  On Friday she develops a sharp pain around the left breast and they called 911.  Paramedics came out to evaluate her and told her likely not cardiac related.  They told them it was likely a pulled muscle or muscle spasm related to PT changes.  Pt has had no further episodes of CP or dizziness.  Daughter states they are monitoring her very well and will call if any further issues.  Daughter appreciative for call.

## 2021-12-10 ENCOUNTER — Ambulatory Visit: Payer: Medicare PPO

## 2021-12-27 ENCOUNTER — Other Ambulatory Visit: Payer: Self-pay | Admitting: Legal Medicine

## 2021-12-27 DIAGNOSIS — I10 Essential (primary) hypertension: Secondary | ICD-10-CM

## 2022-01-19 ENCOUNTER — Other Ambulatory Visit: Payer: Self-pay

## 2022-01-19 ENCOUNTER — Encounter (HOSPITAL_COMMUNITY): Payer: Self-pay

## 2022-01-19 ENCOUNTER — Emergency Department (HOSPITAL_COMMUNITY): Payer: Medicare PPO

## 2022-01-19 ENCOUNTER — Observation Stay (HOSPITAL_COMMUNITY)
Admission: EM | Admit: 2022-01-19 | Discharge: 2022-01-22 | Disposition: A | Discharge status: Home / Self Care | Payer: Medicare PPO | Attending: Internal Medicine | Admitting: Internal Medicine

## 2022-01-19 DIAGNOSIS — S3282XA Multiple fractures of pelvis without disruption of pelvic ring, initial encounter for closed fracture: Secondary | ICD-10-CM | POA: Diagnosis not present

## 2022-01-19 DIAGNOSIS — Z7982 Long term (current) use of aspirin: Secondary | ICD-10-CM | POA: Diagnosis not present

## 2022-01-19 DIAGNOSIS — Z23 Encounter for immunization: Secondary | ICD-10-CM | POA: Diagnosis not present

## 2022-01-19 DIAGNOSIS — S32592A Other specified fracture of left pubis, initial encounter for closed fracture: Principal | ICD-10-CM | POA: Diagnosis present

## 2022-01-19 DIAGNOSIS — I10 Essential (primary) hypertension: Secondary | ICD-10-CM | POA: Insufficient documentation

## 2022-01-19 DIAGNOSIS — W01198A Fall on same level from slipping, tripping and stumbling with subsequent striking against other object, initial encounter: Secondary | ICD-10-CM | POA: Insufficient documentation

## 2022-01-19 DIAGNOSIS — R262 Difficulty in walking, not elsewhere classified: Secondary | ICD-10-CM | POA: Diagnosis not present

## 2022-01-19 DIAGNOSIS — R2681 Unsteadiness on feet: Secondary | ICD-10-CM | POA: Insufficient documentation

## 2022-01-19 DIAGNOSIS — Z043 Encounter for examination and observation following other accident: Secondary | ICD-10-CM | POA: Diagnosis not present

## 2022-01-19 DIAGNOSIS — W19XXXA Unspecified fall, initial encounter: Secondary | ICD-10-CM | POA: Diagnosis not present

## 2022-01-19 DIAGNOSIS — Z8673 Personal history of transient ischemic attack (TIA), and cerebral infarction without residual deficits: Secondary | ICD-10-CM | POA: Insufficient documentation

## 2022-01-19 DIAGNOSIS — Z7984 Long term (current) use of oral hypoglycemic drugs: Secondary | ICD-10-CM | POA: Insufficient documentation

## 2022-01-19 DIAGNOSIS — Z79899 Other long term (current) drug therapy: Secondary | ICD-10-CM | POA: Diagnosis not present

## 2022-01-19 DIAGNOSIS — Z20822 Contact with and (suspected) exposure to covid-19: Secondary | ICD-10-CM | POA: Diagnosis not present

## 2022-01-19 DIAGNOSIS — S79912A Unspecified injury of left hip, initial encounter: Secondary | ICD-10-CM | POA: Diagnosis present

## 2022-01-19 DIAGNOSIS — S0990XA Unspecified injury of head, initial encounter: Secondary | ICD-10-CM | POA: Diagnosis not present

## 2022-01-19 DIAGNOSIS — K5900 Constipation, unspecified: Secondary | ICD-10-CM

## 2022-01-19 DIAGNOSIS — E785 Hyperlipidemia, unspecified: Secondary | ICD-10-CM

## 2022-01-19 DIAGNOSIS — E1169 Type 2 diabetes mellitus with other specified complication: Secondary | ICD-10-CM | POA: Diagnosis not present

## 2022-01-19 DIAGNOSIS — Z952 Presence of prosthetic heart valve: Secondary | ICD-10-CM | POA: Diagnosis not present

## 2022-01-19 DIAGNOSIS — F03B0 Unspecified dementia, moderate, without behavioral disturbance, psychotic disturbance, mood disturbance, and anxiety: Secondary | ICD-10-CM | POA: Diagnosis not present

## 2022-01-19 DIAGNOSIS — I5032 Chronic diastolic (congestive) heart failure: Secondary | ICD-10-CM | POA: Diagnosis present

## 2022-01-19 DIAGNOSIS — E119 Type 2 diabetes mellitus without complications: Secondary | ICD-10-CM | POA: Diagnosis not present

## 2022-01-19 DIAGNOSIS — F0390 Unspecified dementia without behavioral disturbance: Secondary | ICD-10-CM | POA: Insufficient documentation

## 2022-01-19 DIAGNOSIS — S199XXA Unspecified injury of neck, initial encounter: Secondary | ICD-10-CM | POA: Diagnosis not present

## 2022-01-19 DIAGNOSIS — Y92009 Unspecified place in unspecified non-institutional (private) residence as the place of occurrence of the external cause: Secondary | ICD-10-CM | POA: Insufficient documentation

## 2022-01-19 DIAGNOSIS — M160 Bilateral primary osteoarthritis of hip: Secondary | ICD-10-CM | POA: Diagnosis not present

## 2022-01-19 DIAGNOSIS — S32810A Multiple fractures of pelvis with stable disruption of pelvic ring, initial encounter for closed fracture: Secondary | ICD-10-CM | POA: Diagnosis not present

## 2022-01-19 DIAGNOSIS — D241 Benign neoplasm of right breast: Secondary | ICD-10-CM | POA: Diagnosis not present

## 2022-01-19 DIAGNOSIS — M2578 Osteophyte, vertebrae: Secondary | ICD-10-CM | POA: Diagnosis not present

## 2022-01-19 HISTORY — DX: Other specified fracture of left pubis, initial encounter for closed fracture: S32.592A

## 2022-01-19 LAB — CBC WITH DIFFERENTIAL/PLATELET
Abs Immature Granulocytes: 0.48 10*3/uL — ABNORMAL HIGH (ref 0.00–0.07)
Basophils Absolute: 0.1 10*3/uL (ref 0.0–0.1)
Basophils Relative: 1 %
Eosinophils Absolute: 0.4 10*3/uL (ref 0.0–0.5)
Eosinophils Relative: 2 %
HCT: 40.2 % (ref 36.0–46.0)
Hemoglobin: 12.9 g/dL (ref 12.0–15.0)
Immature Granulocytes: 3 %
Lymphocytes Relative: 11 %
Lymphs Abs: 1.7 10*3/uL (ref 0.7–4.0)
MCH: 29.3 pg (ref 26.0–34.0)
MCHC: 32.1 g/dL (ref 30.0–36.0)
MCV: 91.4 fL (ref 80.0–100.0)
Monocytes Absolute: 0.8 10*3/uL (ref 0.1–1.0)
Monocytes Relative: 6 %
Neutro Abs: 11.8 10*3/uL — ABNORMAL HIGH (ref 1.7–7.7)
Neutrophils Relative %: 77 %
Platelets: 161 10*3/uL (ref 150–400)
RBC: 4.4 MIL/uL (ref 3.87–5.11)
RDW: 16 % — ABNORMAL HIGH (ref 11.5–15.5)
WBC: 15.3 10*3/uL — ABNORMAL HIGH (ref 4.0–10.5)
nRBC: 0 % (ref 0.0–0.2)

## 2022-01-19 LAB — BASIC METABOLIC PANEL
Anion gap: 8 (ref 5–15)
BUN: 24 mg/dL — ABNORMAL HIGH (ref 8–23)
CO2: 25 mmol/L (ref 22–32)
Calcium: 8.5 mg/dL — ABNORMAL LOW (ref 8.9–10.3)
Chloride: 108 mmol/L (ref 98–111)
Creatinine, Ser: 0.96 mg/dL (ref 0.44–1.00)
GFR, Estimated: 58 mL/min — ABNORMAL LOW (ref >60)
Glucose, Bld: 118 mg/dL — ABNORMAL HIGH (ref 70–99)
Potassium: 4.1 mmol/L (ref 3.5–5.1)
Sodium: 141 mmol/L (ref 135–145)

## 2022-01-19 IMAGING — CT CT CERVICAL SPINE W/O CM
3 of 4 series · 13 of 33 positions shown, 16 images · non-contrast
Comparison: None.

CLINICAL DATA: Neck trauma.



[Series 5: orthogonal bone · axial · 0.29mm/px · z∈[-308,-197]mm · 5 of 89 slices shown, 7 images]
[im 15/89  soft-tissue]
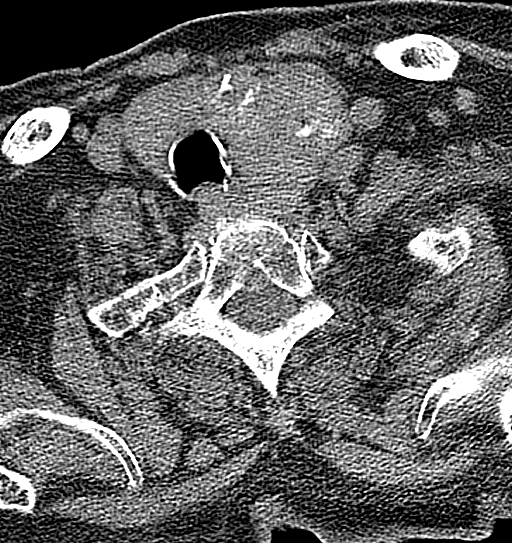
[im 15/89  bone]
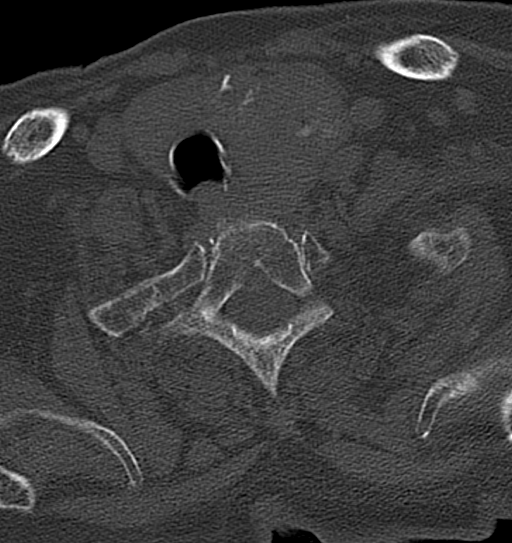
[im 30/89  bone]
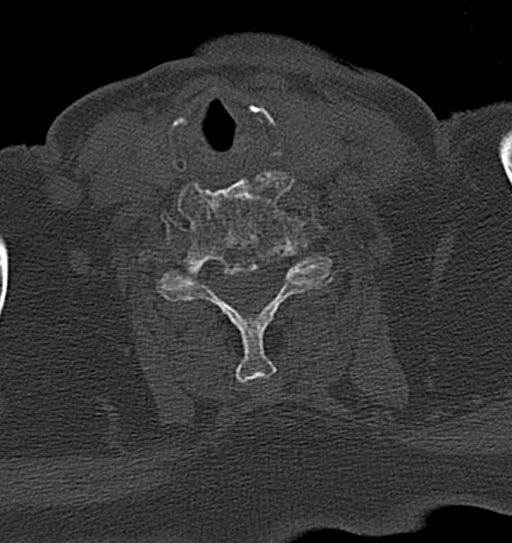
[im 45/89  bone]
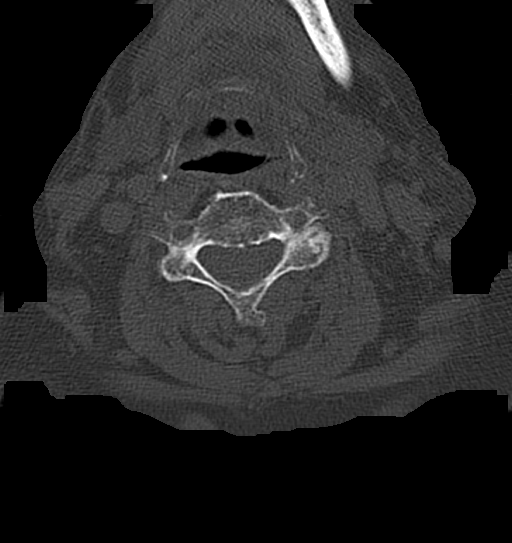
[im 59/89  bone]
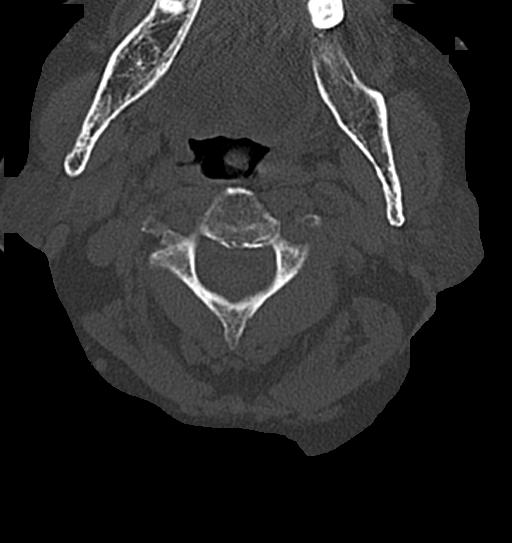
[im 74/89  soft-tissue]
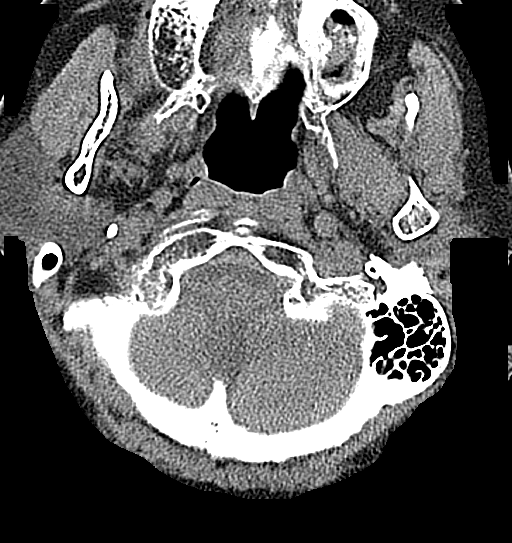
[im 74/89  bone]
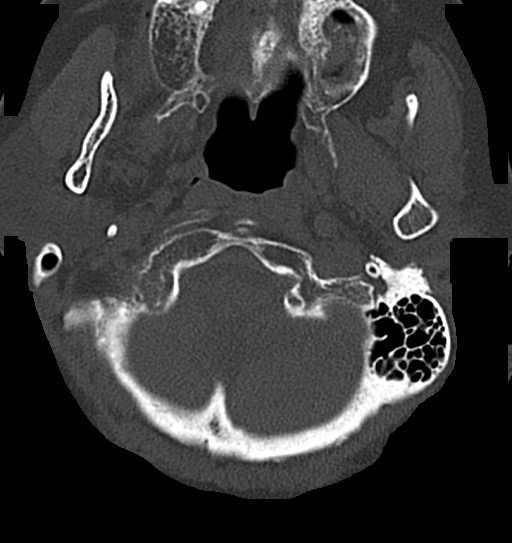

[Series 6: coronal bone · coronal · 0.31mm/px · 3 of 72 slices shown]
[im 17/72  bone]
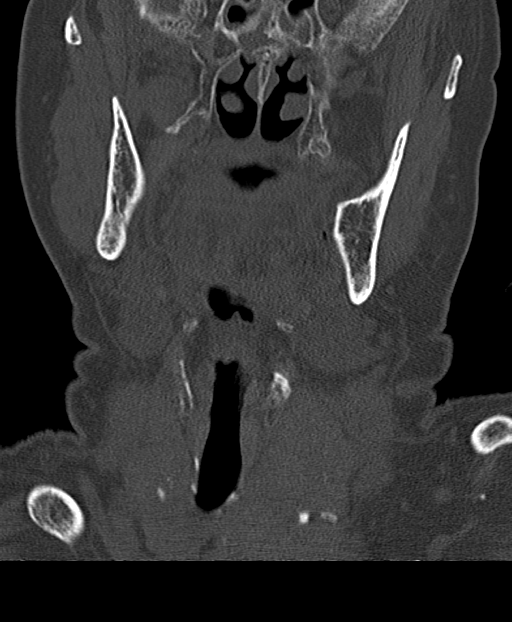
[im 30/72  bone]
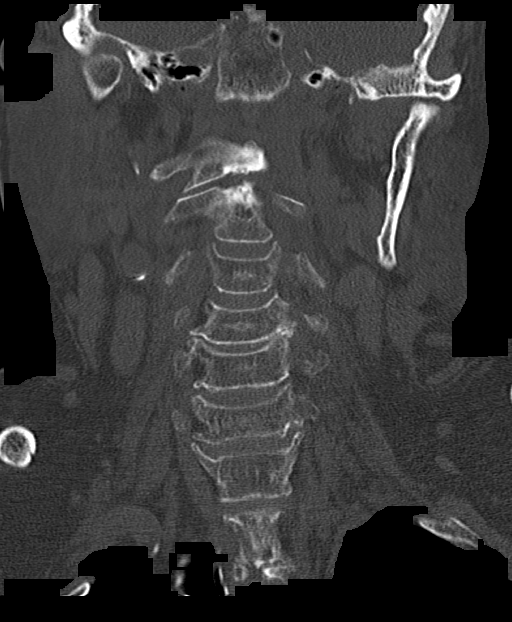
[im 42/72  bone]
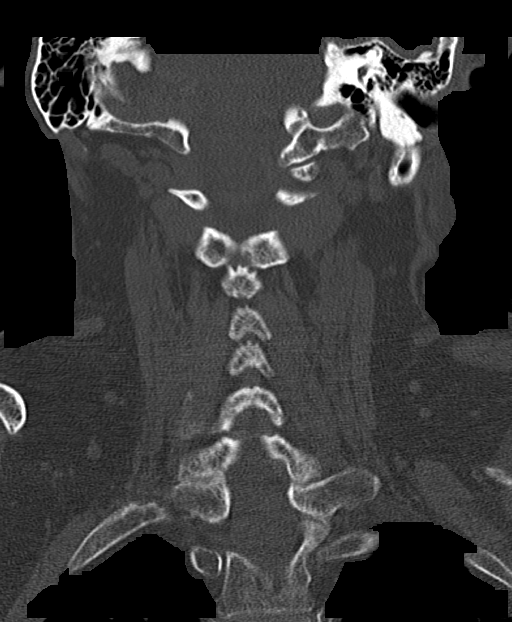

[Series 7: sagittal bone · sagittal · 0.36mm/px · 5 of 71 slices shown, 6 images]
[im 24/71  bone]
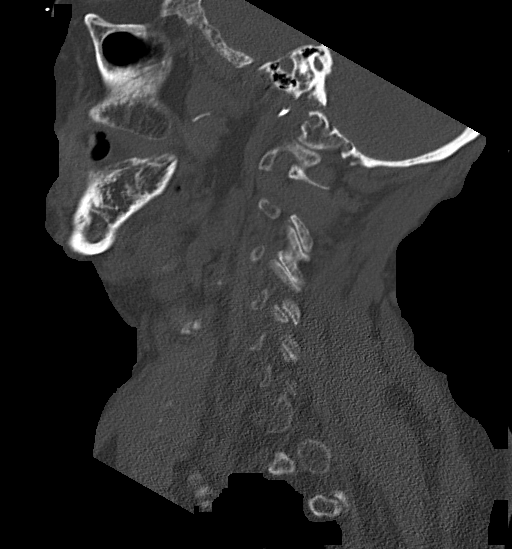
[im 30/71  bone]
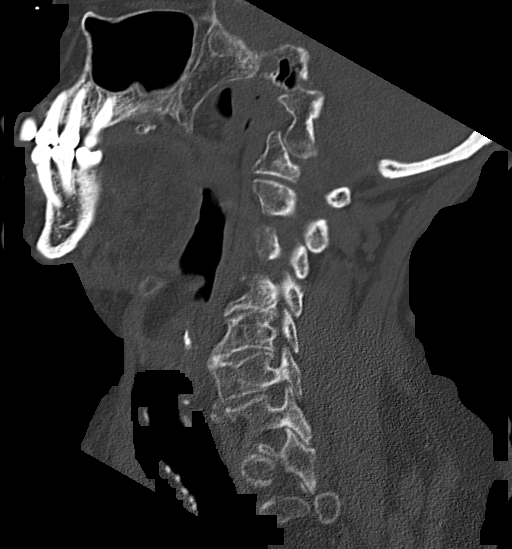
[im 36/71  soft-tissue]
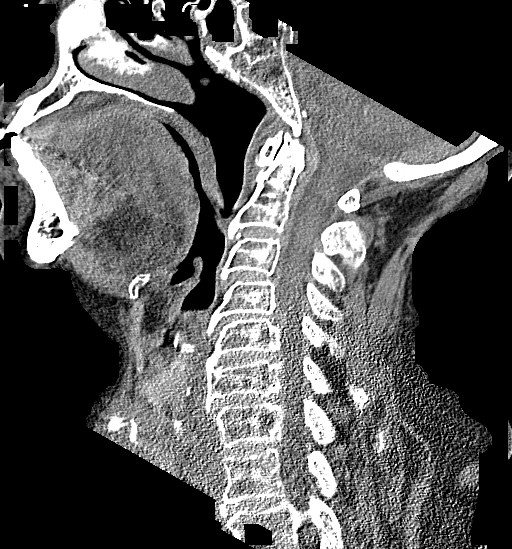
[im 36/71  bone]
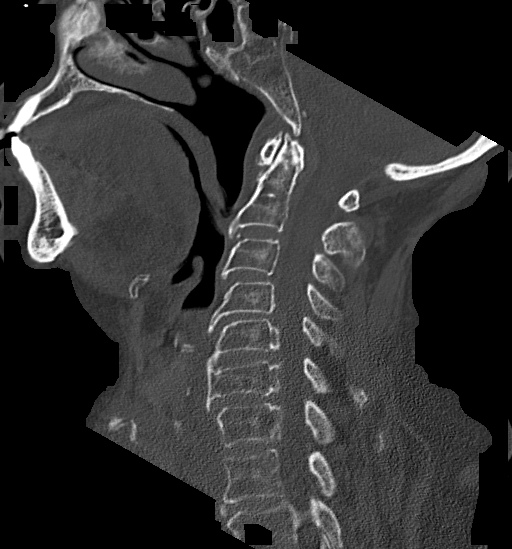
[im 41/71  bone]
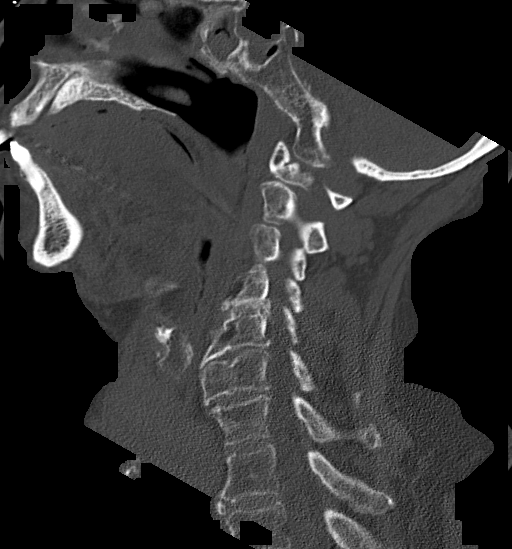
[im 47/71  bone]
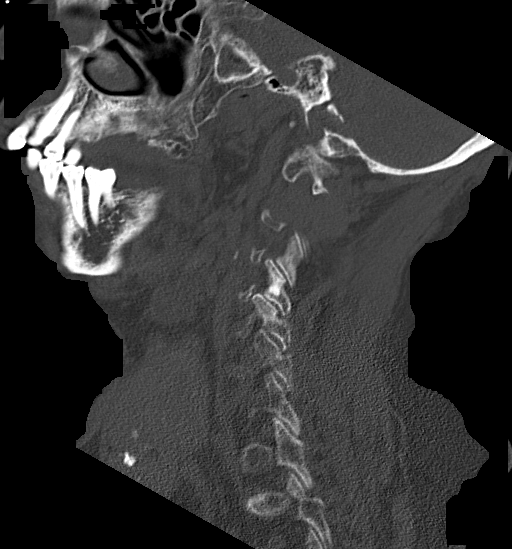

[13 of 33 positions shown; findings below may reference images not displayed]

FINDINGS: CT HEAD FINDINGS

Brain: Moderate age-related atrophy and chronic microvascular
ischemic changes. There is no acute intracranial hemorrhage. No mass
effect or midline shift. No extra-axial fluid collection.

Vascular: No hyperdense vessel or unexpected calcification.

Skull: Normal. Negative for fracture or focal lesion.

Sinuses/Orbits: There is mucoperiosteal thickening of paranasal
sinuses with partial opacification of the sphenoid sinuses. No
air-fluid level. The mastoid air cells are clear.

Other: None

CT CERVICAL SPINE FINDINGS

Alignment: No acute subluxation.

Skull base and vertebrae: No acute fracture.  Osteopenia.

Soft tissues and spinal canal: No prevertebral fluid or swelling. No
visible canal hematoma.

Disc levels: Multilevel degenerative changes with anterior spurring
and osteophyte.

Upper chest: Negative.

Other: Goiter with multiple calcified nodule. In the setting of
significant comorbidities or limited life expectancy, no follow-up
recommended (ref: [HOSPITAL]. [DATE]): 143-50).
IMPRESSION: 1. No acute intracranial pathology. Moderate age-related atrophy and
chronic microvascular ischemic changes.
2. No acute fracture or subluxation of the cervical spine.

## 2022-01-19 IMAGING — CR DG HIP (WITH OR WITHOUT PELVIS) 5+V BILAT
5 series · 5 of 5 positions shown · non-contrast
Comparison: None.

CLINICAL DATA: Concern for fracture, fall

EXAM:
DG HIP (WITH OR WITHOUT PELVIS) 5+V BILAT

[x pelvis]
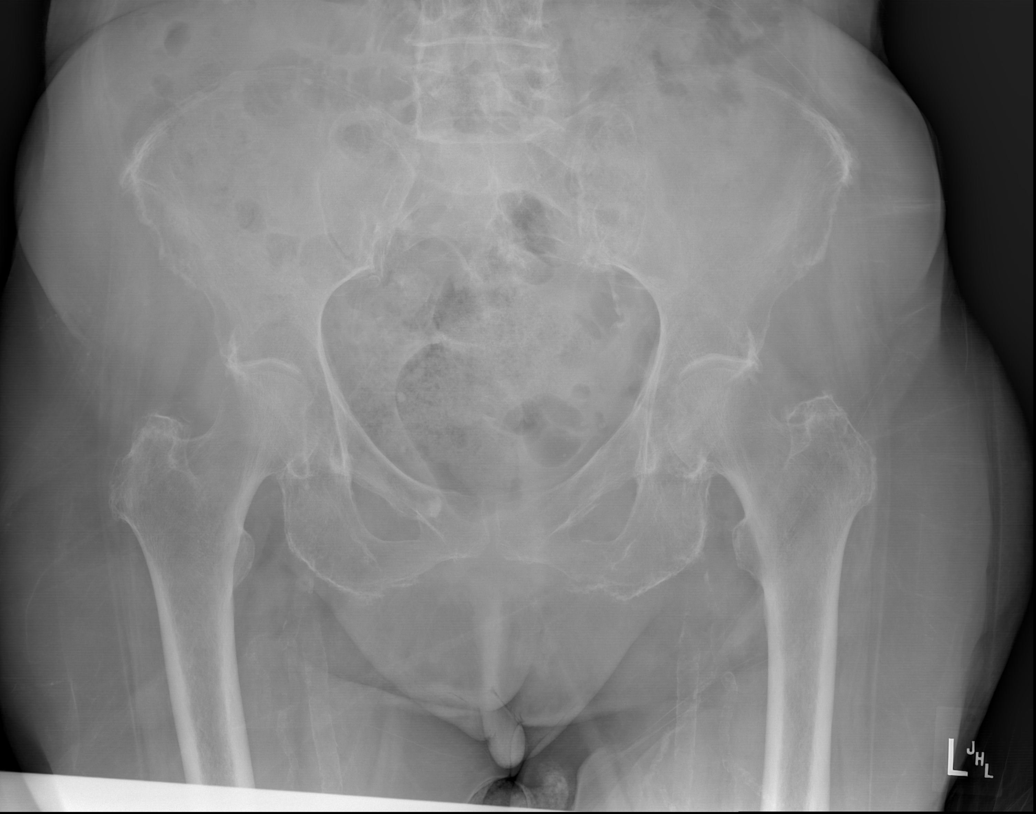

[x hip ap left]
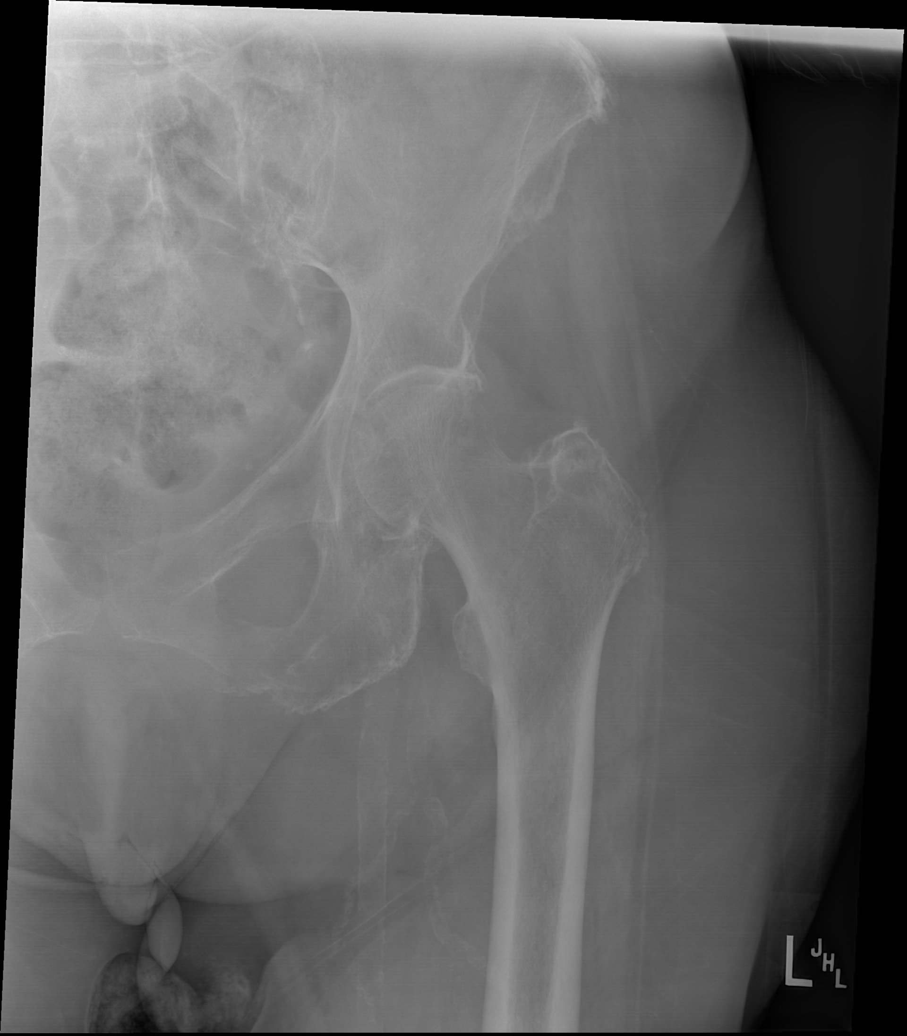

[x hip lat left]
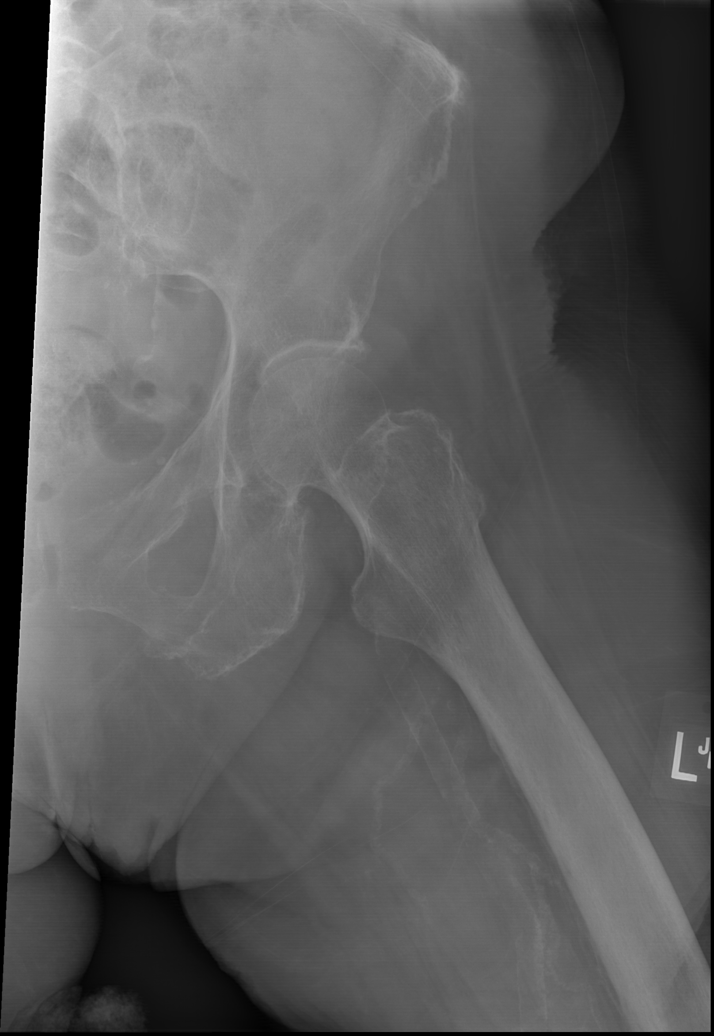

[x hip ap right]
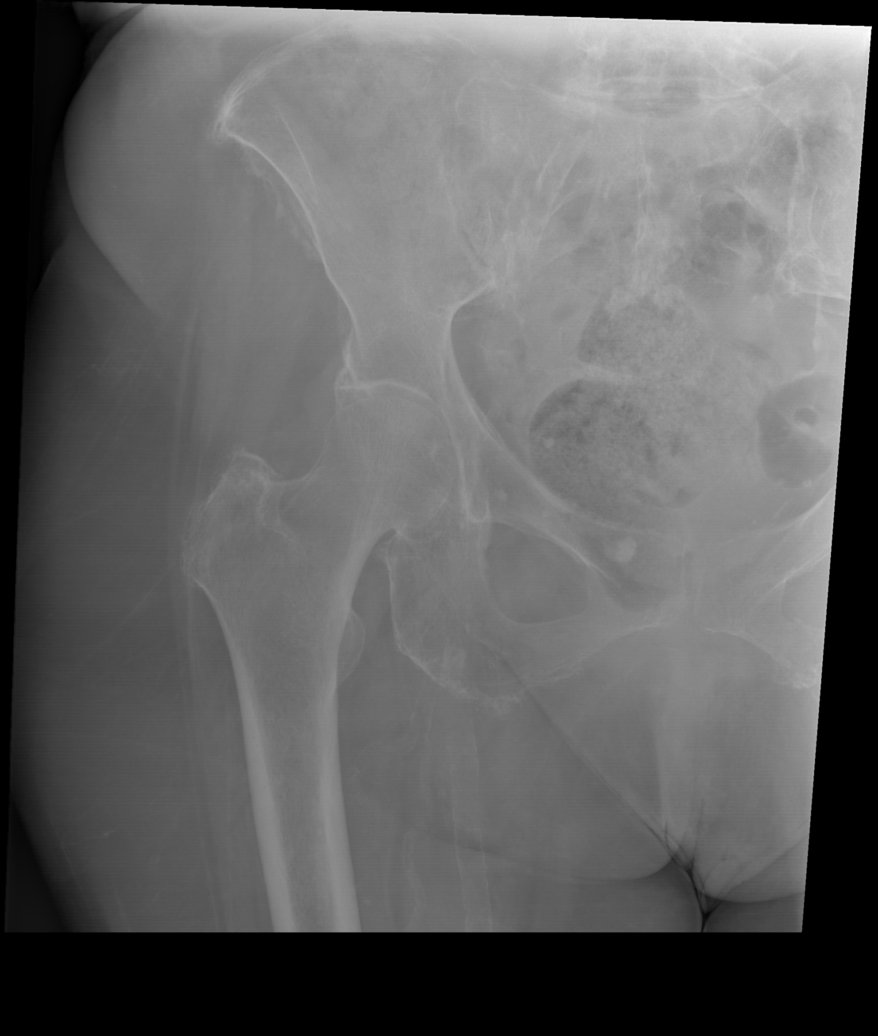

[x hip lat right]
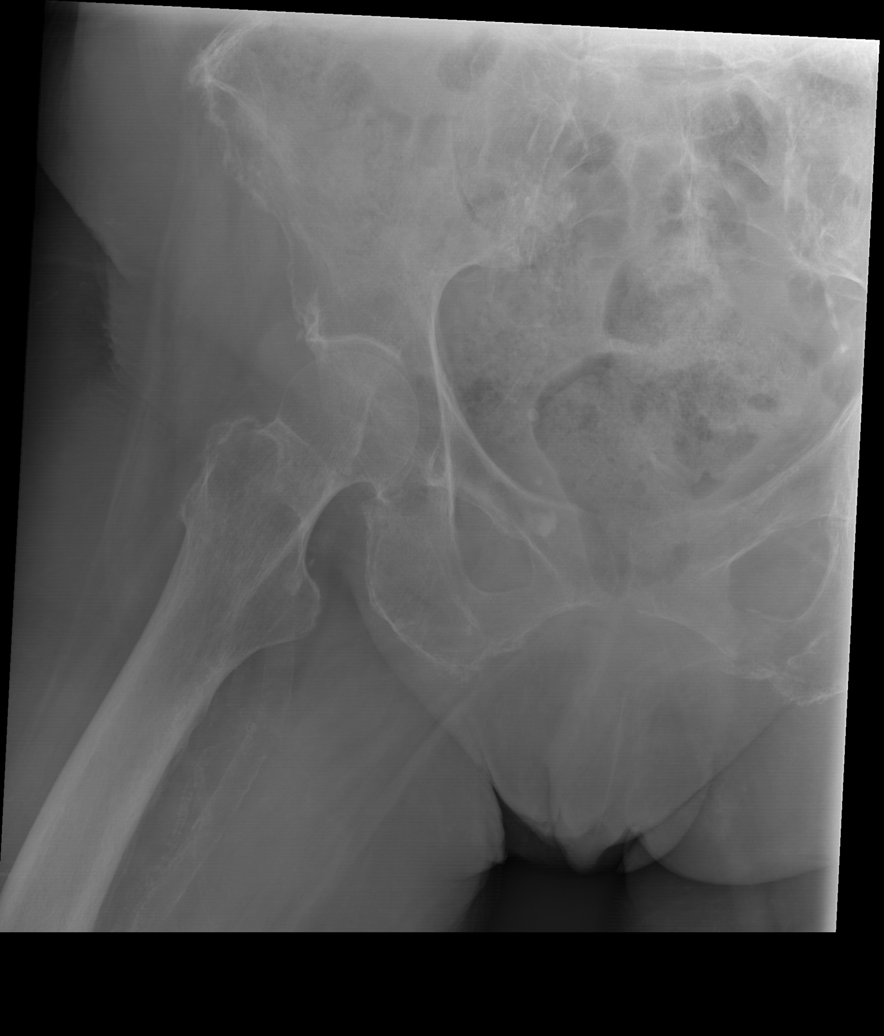

[5 of 5 positions shown; findings below may reference images not displayed]

FINDINGS: SI joints are non widened. Pubic symphysis is intact. Old appearing
fracture deformity involving the right superior pubic ramus. Suspect
acute nondisplaced left superior and inferior pubic rami fractures.
Both femoral heads are normally position. There are mild
degenerative changes of the bilateral hips
IMPRESSION: Suspect acute left superior and inferior pubic rami fractures

## 2022-01-19 IMAGING — DX DG CHEST 1V PORT
1 series · 1 of 1 positions shown · non-contrast
Comparison: [DATE]

CLINICAL DATA: Concern for fracture.  Fall.

EXAM:
PORTABLE CHEST 1 VIEW

[chest ap]
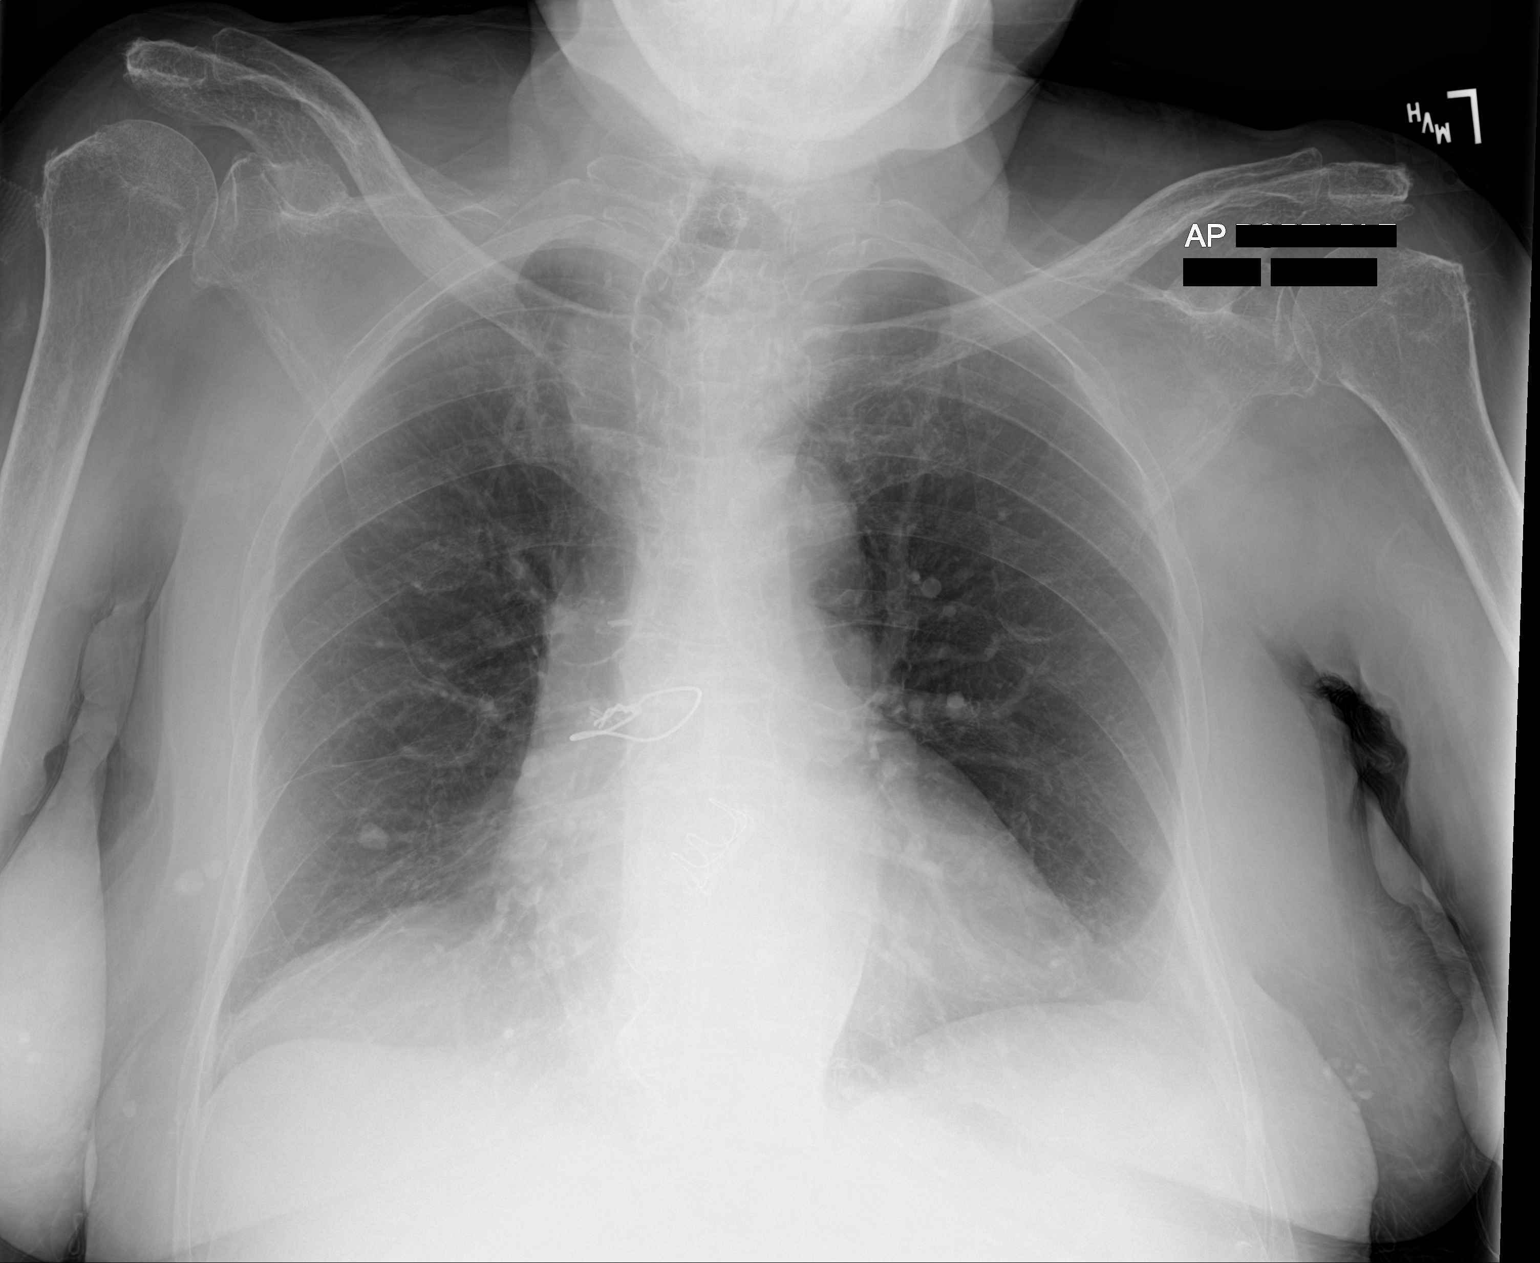

[1 of 1 positions shown; findings below may reference images not displayed]

FINDINGS: Postop aortic valve replacement. Heart size and vascularity normal.
Lungs clear without infiltrate or effusion. Calcified fibroadenomas
in the right breast.
IMPRESSION: No active disease.

## 2022-01-19 IMAGING — CT CT HEAD W/O CM
3 series · 14 of 47 positions shown, 16 images · non-contrast
Comparison: None.

CLINICAL DATA: Neck trauma.



[Series 2: head wo · axial · 0.47mm/px · z∈[-152,-27]mm · 8 of 31 slices shown, 10 images]
[im 3/31  brain]
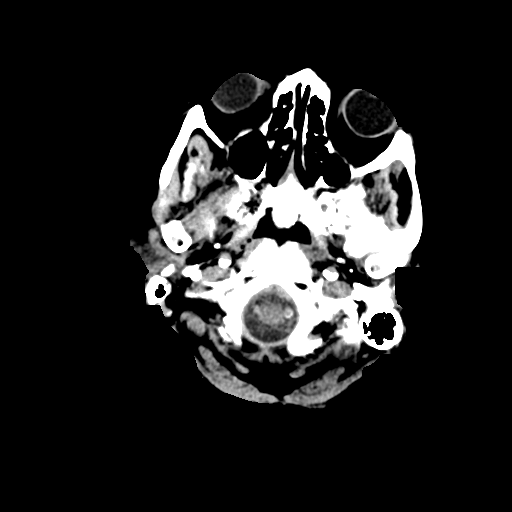
[im 3/31  bone]
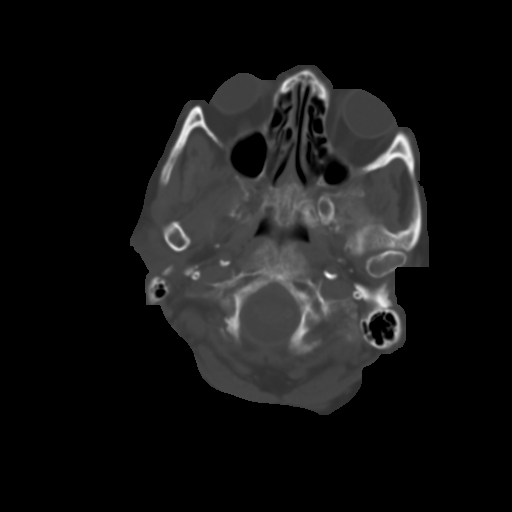
[im 7/31  brain]
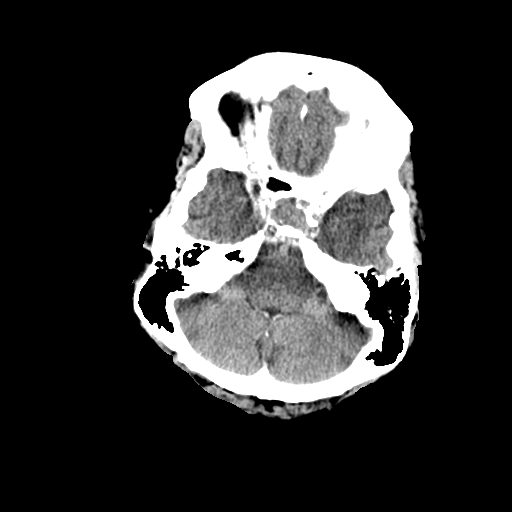
[im 10/31  brain]
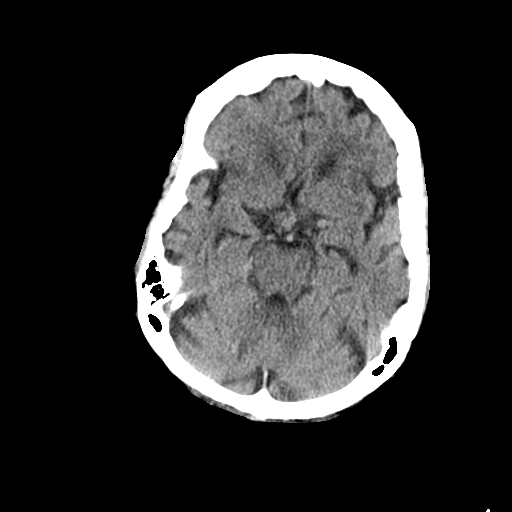
[im 14/31  brain]
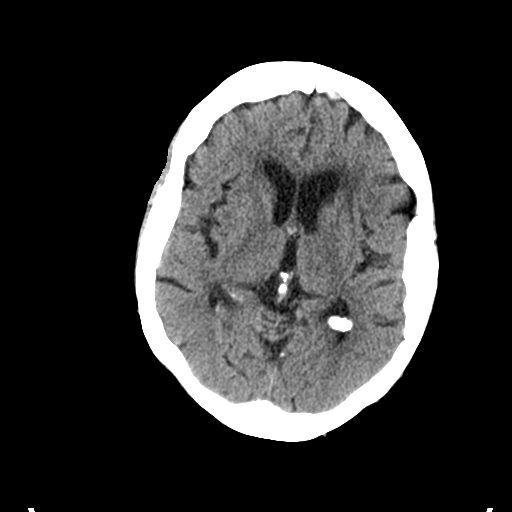
[im 17/31  brain]
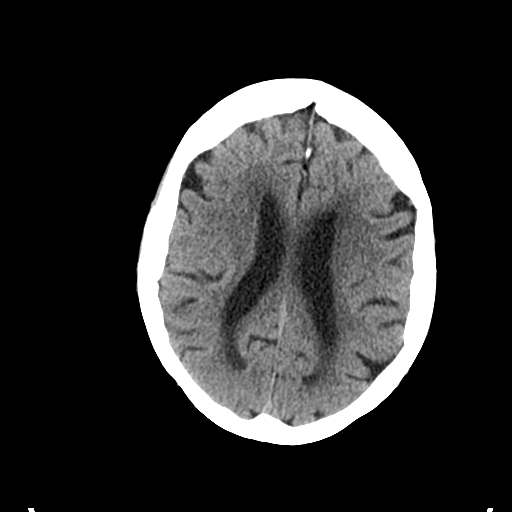
[im 17/31  bone]
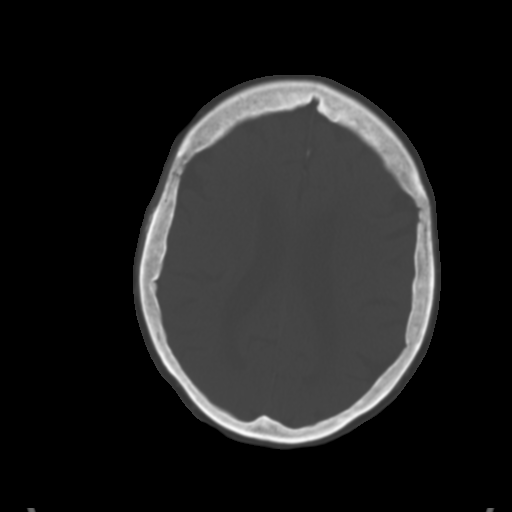
[im 21/31  brain]
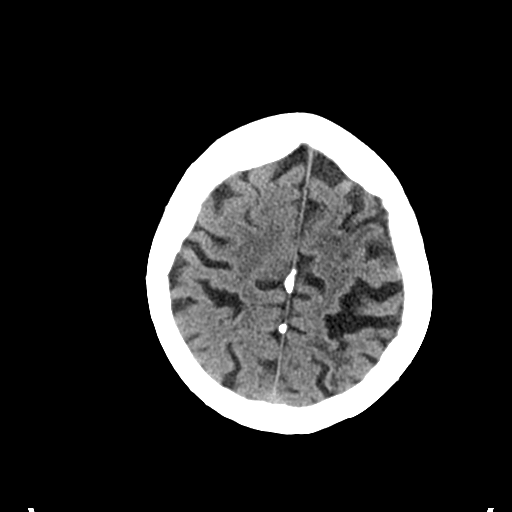
[im 24/31  brain]
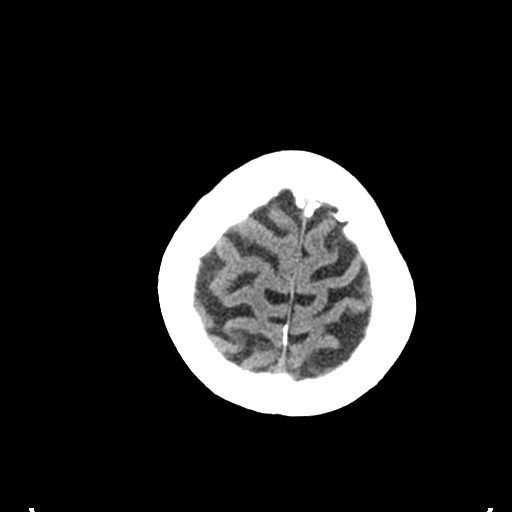
[im 28/31  brain]
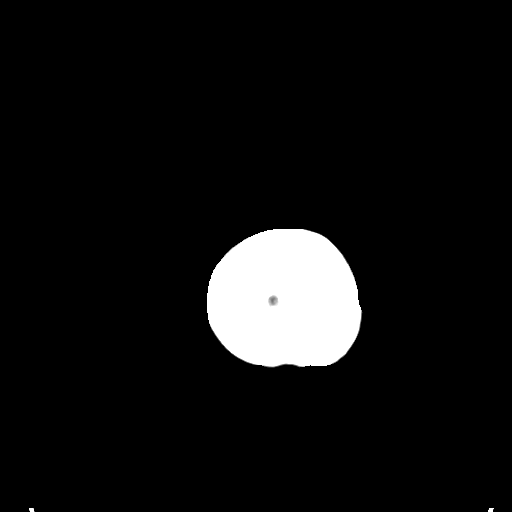

[Series 5: coronal soft tissue · coronal · 0.31mm/px · 3 of 68 slices shown]
[im 23/68  brain]
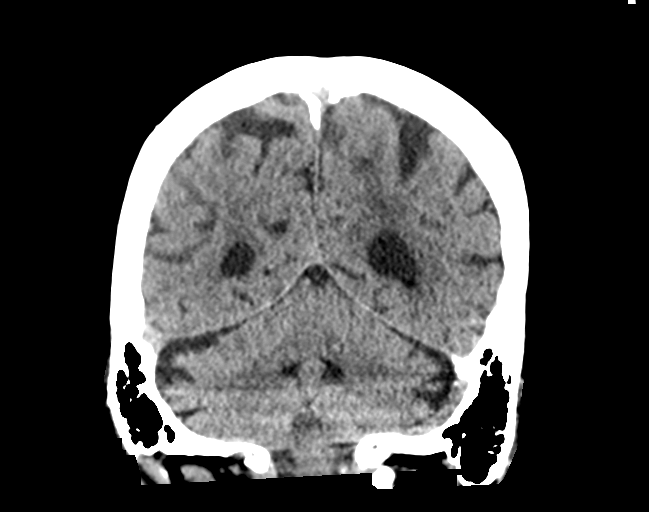
[im 30/68  brain]
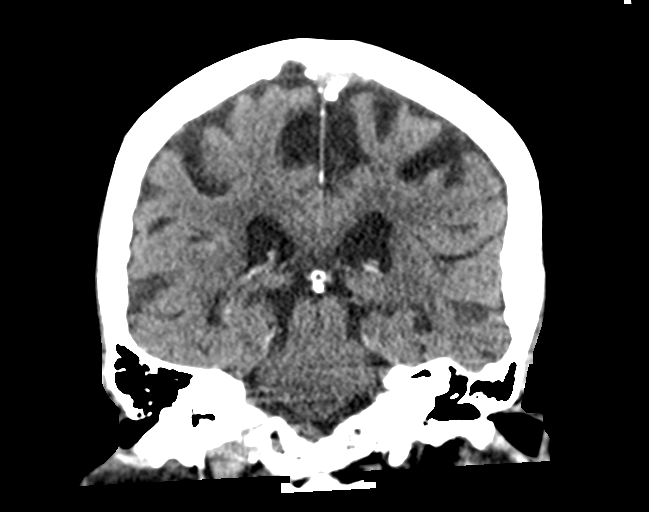
[im 38/68  brain]
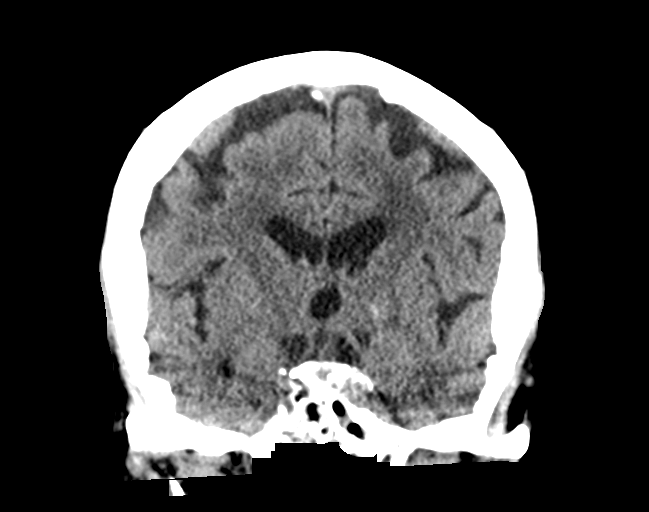

[Series 6: sagittal soft tissue · sagittal · 0.31mm/px · 3 of 69 slices shown]
[im 23/69  brain]
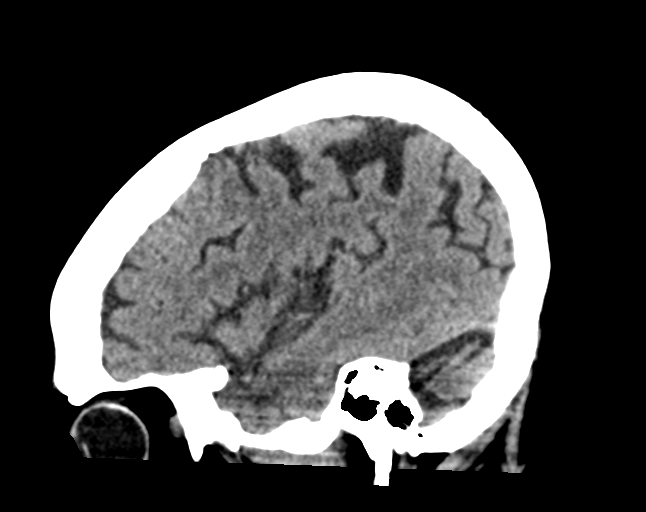
[im 35/69  brain]
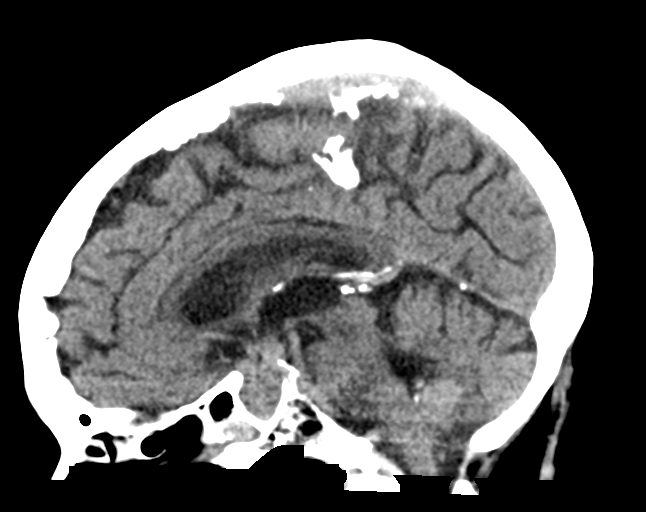
[im 46/69  brain]
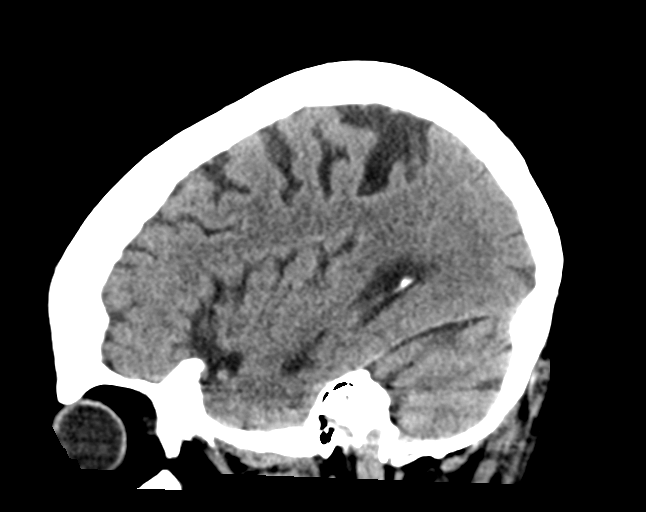

[14 of 47 positions shown; findings below may reference images not displayed]

FINDINGS: CT HEAD FINDINGS

Brain: Moderate age-related atrophy and chronic microvascular
ischemic changes. There is no acute intracranial hemorrhage. No mass
effect or midline shift. No extra-axial fluid collection.

Vascular: No hyperdense vessel or unexpected calcification.

Skull: Normal. Negative for fracture or focal lesion.

Sinuses/Orbits: There is mucoperiosteal thickening of paranasal
sinuses with partial opacification of the sphenoid sinuses. No
air-fluid level. The mastoid air cells are clear.

Other: None

CT CERVICAL SPINE FINDINGS

Alignment: No acute subluxation.

Skull base and vertebrae: No acute fracture.  Osteopenia.

Soft tissues and spinal canal: No prevertebral fluid or swelling. No
visible canal hematoma.

Disc levels: Multilevel degenerative changes with anterior spurring
and osteophyte.

Upper chest: Negative.

Other: Goiter with multiple calcified nodule. In the setting of
significant comorbidities or limited life expectancy, no follow-up
recommended (ref: [HOSPITAL]. [DATE]): 143-50).
IMPRESSION: 1. No acute intracranial pathology. Moderate age-related atrophy and
chronic microvascular ischemic changes.
2. No acute fracture or subluxation of the cervical spine.

## 2022-01-19 MED ORDER — OXYCODONE-ACETAMINOPHEN 5-325 MG PO TABS
1.0000 | ORAL_TABLET | Freq: Once | ORAL | Status: AC
  Administered 2022-01-19: 21:00:00 1 via ORAL
  Filled 2022-01-19: qty 1

## 2022-01-19 MED ORDER — HYDROCODONE-ACETAMINOPHEN 5-325 MG PO TABS: 1.0000 | ORAL_TABLET | Freq: Four times a day (QID) | ORAL | Status: DC | PRN

## 2022-01-19 MED ORDER — LIDOCAINE 5 % EX PTCH
1.0000 | MEDICATED_PATCH | CUTANEOUS | Status: DC
  Administered 2022-01-20 – 2022-01-21 (×3): 1 via TRANSDERMAL
  Filled 2022-01-19 (×3): qty 1

## 2022-01-19 NOTE — ED Provider Notes (Signed)
[facility] DEPT Provider Note   CSN: [id number] Arrival date & time: 01/19/22  1813     History Past Medical History:  Diagnosis Date   Diabetes [facility])    Hypertension    Nonrheumatic aortic (valve) stenosis 09/21/2020    Chief Complaint  Patient presents with   Fall   Left Hip Pain    Christine Newton is a 88 y.o. female.  Patient is an 88 y.o. F with dementia who fell at home who is coming in with hip pain. She says that she fell today and hit her head and her hip but due to her dementia she is unable to give much more detail. She denies any pain in her head but says that her hip hurts.  Daughter subsequently arrived and was able to give more history. Says her mom ambulates with a walker at baseline and is able to get around pretty well. She just finished PT and is pretty strong. Her mom was using the bathroom and she heard her yell and she immediately went and checked on her and saw that she had fallen. Says that this fall occurred because her mom tripped not because she was dizzy or had low blood pressure or anything like that.   The history is provided by the patient.  Fall      Home Medications Prior to Admission medications   Medication Sig Start Date End Date Taking? Authorizing Provider  ACCU-CHEK GUIDE test strip 1 EACH BY OTHER ROUTE 2 (TWO) TIMES DAILY AS NEEDED. USE AS INSTRUCTED 03/06/21   Lillard Anes, MD  Accu-Chek Softclix Lancets lancets 1 EACH BY DOES NOT APPLY ROUTE 3 (THREE) TIMES DAILY. 03/03/21   Lillard Anes, MD  aspirin EC 81 MG tablet Take 1 tablet (81 mg total) by mouth daily. Swallow whole. 01/21/21   Frann Rider, NP  atorvastatin (LIPITOR) 20 MG tablet Take 1 tablet (20 mg total) by mouth daily. 12/16/20   Dwyane Dee, MD  Blood Glucose Monitoring Suppl (ACCU-CHEK GUIDE) w/Device KIT 1 each by Does not apply route 2 (two) times daily as needed. 06/18/20   Lillard Anes, MD  Lancets  Misc. (ACCU-CHEK SOFTCLIX LANCET DEV) KIT 1 each by Does not apply route 3 (three) times daily. 02/05/21   Lillard Anes, MD  lisinopril (ZESTRIL) 20 MG tablet TAKE 1 TABLET BY MOUTH EVERY DAY 12/28/21   Lillard Anes, MD  metFORMIN (GLUCOPHAGE) 500 MG tablet Take 500 mg by mouth daily with breakfast. 07/07/19   [provider]      Allergies    Cephalosporins    Review of Systems   Review of Systems  Musculoskeletal:  Positive for arthralgias and myalgias.   Physical Exam Updated Vital Signs BP (!) 142/77    Pulse 81    Resp 17    SpO2 95%  Physical Exam Constitutional:      Appearance: Normal appearance.  HENT:     Head: Normocephalic and atraumatic.  Eyes:     Extraocular Movements: Extraocular movements intact.  Cardiovascular:     Rate and Rhythm: Normal rate and regular rhythm.  Pulmonary:     Effort: Pulmonary effort is normal.     Breath sounds: Normal breath sounds.  Abdominal:     General: Bowel sounds are normal.     Palpations: Abdomen is soft.  Musculoskeletal:     Right hip: Tenderness and bony tenderness present. No deformity.     Left hip: Tenderness  and bony tenderness present. No deformity.     Right upper leg: No tenderness or bony tenderness.     Left upper leg: No tenderness or bony tenderness.     Right knee: No bony tenderness. No tenderness.     Left knee: No bony tenderness. No tenderness.     Right lower leg: Normal.     Left lower leg: Normal.  Skin:    General: Skin is warm and dry.  Neurological:     Mental Status: She is alert. Mental status is at baseline.    ED Results / Procedures / Treatments   Labs (all labs ordered are listed, but only abnormal results are displayed) Labs Reviewed  BASIC METABOLIC PANEL - Abnormal; Notable for the following components:      Result Value   Glucose, Bld 118 (*)    BUN 24 (*)    Calcium 8.5 (*)    GFR, Estimated 58 (*)    All other components within normal limits  CBC WITH  DIFFERENTIAL/PLATELET - Abnormal; Notable for the following components:   WBC 15.3 (*)    RDW 16.0 (*)    Neutro Abs 11.8 (*)    Abs Immature Granulocytes 0.48 (*)    All other components within normal limits  URINALYSIS, ROUTINE W REFLEX MICROSCOPIC    EKG None  Radiology CT Head Wo Contrast  Result Date: 01/19/2022 CLINICAL DATA:  Neck trauma. EXAM: CT HEAD WITHOUT CONTRAST CT CERVICAL SPINE WITHOUT CONTRAST TECHNIQUE: Multidetector CT imaging of the head and cervical spine was performed following the standard protocol without intravenous contrast. Multiplanar CT image reconstructions of the cervical spine were also generated. RADIATION DOSE REDUCTION: This exam was performed according to the departmental dose-optimization program which includes automated exposure control, adjustment of the mA and/or kV according to patient size and/or use of iterative reconstruction technique. COMPARISON:  None. FINDINGS: CT HEAD FINDINGS Brain: Moderate age-related atrophy and chronic microvascular ischemic changes. There is no acute intracranial hemorrhage. No mass effect or midline shift. No extra-axial fluid collection. Vascular: No hyperdense vessel or unexpected calcification. Skull: Normal. Negative for fracture or focal lesion. Sinuses/Orbits: There is mucoperiosteal thickening of paranasal sinuses with partial opacification of the sphenoid sinuses. No air-fluid level. The mastoid air cells are clear. Other: None CT CERVICAL SPINE FINDINGS Alignment: No acute subluxation. Skull base and vertebrae: No acute fracture.  Osteopenia. Soft tissues and spinal canal: No prevertebral fluid or swelling. No visible canal hematoma. Disc levels: Multilevel degenerative changes with anterior spurring and osteophyte. Upper chest: Negative. Other: Goiter with multiple calcified nodule. In the setting of significant comorbidities or limited life expectancy, no follow-up recommended (ref: J Am Coll Radiol. 2015 Feb;12(2):  143-50). IMPRESSION: 1. No acute intracranial pathology. Moderate age-related atrophy and chronic microvascular ischemic changes. 2. No acute fracture or subluxation of the cervical spine. Electronically Signed   By: Anner Crete M.D.   On: 01/19/2022 19:38   CT Cervical Spine Wo Contrast  Result Date: 01/19/2022 CLINICAL DATA:  Neck trauma. EXAM: CT HEAD WITHOUT CONTRAST CT CERVICAL SPINE WITHOUT CONTRAST TECHNIQUE: Multidetector CT imaging of the head and cervical spine was performed following the standard protocol without intravenous contrast. Multiplanar CT image reconstructions of the cervical spine were also generated. RADIATION DOSE REDUCTION: This exam was performed according to the departmental dose-optimization program which includes automated exposure control, adjustment of the mA and/or kV according to patient size and/or use of iterative reconstruction technique. COMPARISON:  None. FINDINGS: CT HEAD FINDINGS Brain:  Moderate age-related atrophy and chronic microvascular ischemic changes. There is no acute intracranial hemorrhage. No mass effect or midline shift. No extra-axial fluid collection. Vascular: No hyperdense vessel or unexpected calcification. Skull: Normal. Negative for fracture or focal lesion. Sinuses/Orbits: There is mucoperiosteal thickening of paranasal sinuses with partial opacification of the sphenoid sinuses. No air-fluid level. The mastoid air cells are clear. Other: None CT CERVICAL SPINE FINDINGS Alignment: No acute subluxation. Skull base and vertebrae: No acute fracture.  Osteopenia. Soft tissues and spinal canal: No prevertebral fluid or swelling. No visible canal hematoma. Disc levels: Multilevel degenerative changes with anterior spurring and osteophyte. Upper chest: Negative. Other: Goiter with multiple calcified nodule. In the setting of significant comorbidities or limited life expectancy, no follow-up recommended (ref: J Am Coll Radiol. 2015 Feb;12(2): 143-50).  IMPRESSION: 1. No acute intracranial pathology. Moderate age-related atrophy and chronic microvascular ischemic changes. 2. No acute fracture or subluxation of the cervical spine. Electronically Signed   By: Anner Crete M.D.   On: 01/19/2022 19:38   DG Chest Portable 1 View  Result Date: 01/19/2022 CLINICAL DATA:  Concern for fracture.  Fall. EXAM: PORTABLE CHEST 1 VIEW COMPARISON:  12/13/2020 FINDINGS: Postop aortic valve replacement. Heart size and vascularity normal. Lungs clear without infiltrate or effusion. Calcified fibroadenomas in the right breast. IMPRESSION: No active disease. Electronically Signed   By: Franchot Gallo M.D.   On: 01/19/2022 19:13   DG Hips Bilat W or Wo Pelvis 5 Views  Result Date: 01/19/2022 CLINICAL DATA:  Concern for fracture, fall EXAM: DG HIP (WITH OR WITHOUT PELVIS) 5+V BILAT COMPARISON:  None. FINDINGS: SI joints are non widened. Pubic symphysis is intact. Old appearing fracture deformity involving the right superior pubic ramus. Suspect acute nondisplaced left superior and inferior pubic rami fractures. Both femoral heads are normally position. There are mild degenerative changes of the bilateral hips IMPRESSION: Suspect acute left superior and inferior pubic rami fractures Electronically Signed   By: Donavan Foil M.D.   On: 01/19/2022 19:25    Procedures Procedures    Medications Ordered in ED Medications  oxyCODONE-acetaminophen (PERCOCET/ROXICET) 5-325 MG per tablet 1 tablet (1 tablet Oral Given 01/19/22 2057)    ED Course/ Medical Decision Making/ A&P                           Medical Decision Making  Patient presenting after a fall with tenderness of the hips/pelvis bilaterally though worse on the left. CT head and C spine obtained to rule out acute intracranial pathology or fracture. Both studies were reviewed by me and were negative. XR of bilateral hips were obtained which was reviewed by me and was notable for a left superior and inferior  pubic rami fracture. EKG reviewed by me was unchanged from prior EKG. CBC reviewed by me notable for a leukocytosis of 15. Patient given pain medication but despite this not able to tolerate ambulation. Will consult hospitalist for admission.  Final Clinical Impression(s) / ED Diagnoses Final diagnoses:  Multiple closed fractures of pelvis without disruption of pelvic ring, initial encounter [facility])  Fall, initial encounter    Rx / DC Orders ED Discharge Orders     None         Scarlett Presto, MD 01/19/22 2249    Lucrezia Starch, MD 01/21/22 [id number]

## 2022-01-19 NOTE — ED Notes (Signed)
Attempted to ambulate patient.  Patient able to stand with assistance.  Will not attempt to walk forward.  Daughter reports this "is different and not like her."  MD made aware

## 2022-01-19 NOTE — ED Triage Notes (Signed)
Pt [facility] from home. Pt fell at 0330 today. Pt fell in the bathroom, hitting her head, landing on her left hip. Pt has dementia. EMS reports no obvious head wounds. Pt denies LOC. Family helped pt to get up after fall. Pt c/o left hip pain throughout the day. EMS states pt has no obvious deformities, no shortening. Pt daughter is POA, [place] [phone]. Family denied pain meds for patient.

## 2022-01-20 DIAGNOSIS — S32592A Other specified fracture of left pubis, initial encounter for closed fracture: Secondary | ICD-10-CM | POA: Diagnosis not present

## 2022-01-20 DIAGNOSIS — E1169 Type 2 diabetes mellitus with other specified complication: Secondary | ICD-10-CM | POA: Diagnosis present

## 2022-01-20 LAB — URINALYSIS, ROUTINE W REFLEX MICROSCOPIC
Bilirubin Urine: NEGATIVE
Glucose, UA: 50 mg/dL — AB
Hgb urine dipstick: NEGATIVE
Ketones, ur: 20 mg/dL — AB
Leukocytes,Ua: NEGATIVE
Nitrite: NEGATIVE
Protein, ur: 30 mg/dL — AB
Specific Gravity, Urine: 1.028 (ref 1.005–1.030)
pH: 5 (ref 5.0–8.0)

## 2022-01-20 LAB — CBC
HCT: 41.3 % (ref 36.0–46.0)
Hemoglobin: 12.8 g/dL (ref 12.0–15.0)
MCH: 28.4 pg (ref 26.0–34.0)
MCHC: 31 g/dL (ref 30.0–36.0)
MCV: 91.8 fL (ref 80.0–100.0)
Platelets: 167 10*3/uL (ref 150–400)
RBC: 4.5 MIL/uL (ref 3.87–5.11)
RDW: 16.1 % — ABNORMAL HIGH (ref 11.5–15.5)
WBC: 14 10*3/uL — ABNORMAL HIGH (ref 4.0–10.5)
nRBC: 0 % (ref 0.0–0.2)

## 2022-01-20 LAB — RESP PANEL BY RT-PCR (FLU A&B, COVID) ARPGX2
Influenza A by PCR: NEGATIVE
Influenza B by PCR: NEGATIVE
SARS Coronavirus 2 by RT PCR: NEGATIVE

## 2022-01-20 MED ORDER — ENOXAPARIN SODIUM 40 MG/0.4ML IJ SOSY
40.0000 mg | PREFILLED_SYRINGE | INTRAMUSCULAR | Status: DC
  Administered 2022-01-20 – 2022-01-22 (×3): 40 mg via SUBCUTANEOUS
  Filled 2022-01-20 (×3): qty 0.4

## 2022-01-20 MED ORDER — LISINOPRIL 20 MG PO TABS
20.0000 mg | ORAL_TABLET | Freq: Every day | ORAL | Status: DC
  Administered 2022-01-20 – 2022-01-22 (×3): 20 mg via ORAL
  Filled 2022-01-20 (×3): qty 1

## 2022-01-20 MED ORDER — ATORVASTATIN CALCIUM 20 MG PO TABS
20.0000 mg | ORAL_TABLET | Freq: Every day | ORAL | Status: DC
  Administered 2022-01-20 – 2022-01-22 (×3): 20 mg via ORAL
  Filled 2022-01-20 (×3): qty 1

## 2022-01-20 MED ORDER — ASPIRIN EC 81 MG PO TBEC
81.0000 mg | DELAYED_RELEASE_TABLET | Freq: Every day | ORAL | Status: DC
  Administered 2022-01-20 – 2022-01-22 (×3): 81 mg via ORAL
  Filled 2022-01-20 (×3): qty 1

## 2022-01-20 NOTE — Plan of Care (Signed)

## 2022-01-20 NOTE — Evaluation (Signed)
Physical Therapy Evaluation Patient Details Name: Christine Newton MRN: [id number] DOB: 1933/02/24 Today's Date: 01/20/2022  History of Present Illness  88 y.o. female with medical history significant for dementia, TIA, hypertension, type 2 diabetes, s/p AVR who presents with concerns of a fall. Dx of L superior and inferior pubic rami fractures.  Clinical Impression  Pt admitted with above diagnosis. Max assist supine to sit, mod assist for sit to stand. In standing with RW, pt was unable to weight shift in order to take steps. Used Stedy to transfer pt to bedside commode, then to recliner. ST-SNF recommended. Pt is pleasantly confused, no family present to provide detailed hx, however pt stated she lives with her daughter and walks with a RW at baseline.  Pt currently with functional limitations due to the deficits listed below (see PT Problem List). Pt will benefit from skilled PT to increase their independence and safety with mobility to allow discharge to the venue listed below.          Recommendations for follow up therapy are one component of a multi-disciplinary discharge planning process, led by the attending physician.  Recommendations may be updated based on patient status, additional functional criteria and insurance authorization.  Follow Up Recommendations Skilled nursing-short term rehab (<3 hours/day)    Assistance Recommended at Discharge Frequent or constant Supervision/Assistance  Patient can return home with the following  A lot of help with walking and/or transfers;A lot of help with bathing/dressing/bathroom;Direct supervision/assist for medications management;Assist for transportation;Help with stairs or ramp for entrance;Assistance with cooking/housework    Equipment Recommendations None recommended by PT  Recommendations for Other Services       Functional Status Assessment Patient has had a recent decline in their functional status and demonstrates the ability  to make significant improvements in function in a reasonable and predictable amount of time.     Precautions / Restrictions Precautions Precautions: Fall Precaution Comments: fell just prior to admission, pt poor historian so not able to provide fall hx Restrictions Weight Bearing Restrictions: No      Mobility  Bed Mobility Overal bed mobility: Needs Assistance Bed Mobility: Supine to Sit     Supine to sit: Max assist     General bed mobility comments: assist to raise trunk and pivot hips to EOB    Transfers Overall transfer level: Needs assistance Equipment used: Rolling walker (2 wheels) Transfers: Sit to/from Stand, Bed to chair/wheelchair/BSC Sit to Stand: Mod assist, From elevated surface Stand pivot transfers: Total assist         General transfer comment: mod A sit to stand from elevated bed, unable to weight shift to take steps in standing so returned to sitting and used Stedy to pivot to 3 in 1 then to recliner. Transfer via Lift Equipment: Stedy  Ambulation/Gait               General Gait Details: unable  Financial trader Rankin (Stroke Patients Only)       Balance Overall balance assessment: Needs assistance Sitting-balance support: Feet supported, No upper extremity supported Sitting balance-[name]: Fair     Standing balance support: Bilateral upper extremity supported Standing balance-[name]: Poor Standing balance comment: relies on BUE support                             Pertinent Vitals/Pain Pain Assessment  Pain Assessment: Faces Faces Pain Scale: Hurts little more Pain Location: L pelvis with movement Pain Descriptors / Indicators: Grimacing, Guarding Pain Intervention(s): Limited activity within patient's tolerance, Monitored during session, Repositioned    Home Living Family/patient expects to be discharged to:: Private residence Living Arrangements: Children                Home Equipment: Conservation officer, nature (2 wheels) Additional Comments: pt has dementia, not able to provide detailed home info, stated she lives with her daugther and walks with a RW, cannot remember if her daugther assists with ADLs    Prior Function               Mobility Comments: walks with a walker       Hand Dominance        Extremity/Trunk Assessment   Upper Extremity Assessment Upper Extremity Assessment: Overall WFL for tasks assessed    Lower Extremity Assessment Lower Extremity Assessment: LLE deficits/detail LLE Deficits / Details: knee ext -4/5, hip AAROM limited ~50% by pain LLE Sensation: WNL    Cervical / Trunk Assessment Cervical / Trunk Assessment: Normal  Communication   Communication: No difficulties  Cognition Arousal/Alertness: Awake/alert Behavior During Therapy: WFL for tasks assessed/performed Overall Cognitive Status: No family/caregiver present to determine baseline cognitive functioning                                 General Comments: dementia at baseline per chart, pt oriented to self but cannot correctly recall her birthdate, knows she's in the hospital due to a fall, not able to provide her PLOF        General Comments      Exercises     Assessment/Plan    PT Assessment Patient needs continued PT services  PT Problem List Decreased activity tolerance;Decreased balance;Pain;Decreased mobility;Decreased cognition       PT Treatment Interventions Therapeutic activities;Therapeutic exercise;Gait training;Functional mobility training;Patient/family education    PT Goals (Current goals can be found in the Care Plan section)  Acute Rehab PT Goals PT Goal Formulation: Patient unable to participate in goal setting Time For Goal Achievement: 02/03/22 Potential to Achieve Goals: Good    Frequency Min 3X/week     Co-evaluation               AM-PAC PT "6 Clicks" Mobility  Outcome Measure Help  needed turning from your back to your side while in a flat bed without using bedrails?: A Lot Help needed moving from lying on your back to sitting on the side of a flat bed without using bedrails?: A Lot Help needed moving to and from a bed to a chair (including a wheelchair)?: Total Help needed standing up from a chair using your arms (e.g., wheelchair or bedside chair)?: A Lot Help needed to walk in hospital room?: Total Help needed climbing 3-5 steps with a railing? : Total 6 Click Score: 9    End of Session Equipment Utilized During Treatment: Gait belt Activity Tolerance: Patient limited by pain;Patient limited by fatigue Patient left: in chair;with call bell/phone within reach;with chair alarm set Nurse Communication: Mobility status;Need for lift equipment;Other (comment) (needs new purewick) PT Visit Diagnosis: Difficulty in walking, not elsewhere classified (R26.2);Pain;History of falling (Z91.81);Unsteadiness on feet (R26.81) Pain - Right/Left: Left Pain - part of body: Hip    Time: 9509-3267 PT Time Calculation (min) (ACUTE ONLY): 32 min   Charges:   PT  Evaluation $PT Eval Moderate Complexity: 1 Mod PT Treatments $Therapeutic Activity: 8-22 mins       Blondell Reveal Kistler PT 01/20/2022  Acute Rehabilitation Services Pager [phone] Office [phone]

## 2022-01-20 NOTE — Plan of Care (Signed)
Problem: Pain Managment: Goal: General experience of comfort will improve Outcome: Progressing   Problem: Safety: Goal: Ability to remain free from injury will improve Outcome: Progressing   Problem: Skin Integrity: Goal: Risk for impaired skin integrity will decrease Outcome: [place], RN 01/20/22 8:28 PM

## 2022-01-20 NOTE — Assessment & Plan Note (Signed)
Appears to be well.  No behavioral issues.

## 2022-01-20 NOTE — Assessment & Plan Note (Signed)
Continue aspirin 

## 2022-01-20 NOTE — Assessment & Plan Note (Addendum)
Hemoglobin A1c 6.7. On metformin. CBG somewhat elevated but anticipate improvement in control with diet management.

## 2022-01-20 NOTE — Progress Notes (Signed)
°  Progress Note   Patient: Christine Newton RKY:[id number] DOB: 03/12/1933 DOA: 01/19/2022     0 DOS: the patient was seen and examined on 01/20/2022   Brief hospital course: No notes on file  Assessment and Plan * Closed fracture of left inferior and superior pubic ramus ([place])- (present on admission) Mechanical fall. Patient presents with a mechanical fall with pain in her left side. Imaging confirms presence of left superior and inferior pubic rami fractures without any hip fracture. Conservative management recommended. PT recommends SNF. Weightbearing as tolerated. Continue current pain control.  Type 2 diabetes mellitus with hyperlipidemia ([place])- (present on admission) Hemoglobin A1c 6.7. On metformin. Currently holding and sliding scale insulin.  Dementia ([place])- (present on admission) Appears to be well.  No behavioral issues.  S/P AVR (aortic valve replacement) Continue aspirin.  Essential hypertension- (present on admission) Blood pressure stable.  On lisinopril.  Continue.     Subjective: Pain well controlled.  No nausea no vomiting no fever no chills.  Objective Vitals:   01/20/22 0152 01/20/22 0517 01/20/22 0948 01/20/22 1342  BP: (!) 159/81 (!) 158/88 (!) 173/98 132/72  Pulse: 80 80 85 95  Resp: 16 17 20 20   Temp: 97.7 F (36.5 C) (!) 97.5 F (36.4 C) (!) 97.4 F (36.3 C) 97.7 F (36.5 C)  TempSrc: Oral Oral Oral Oral  SpO2: 94% 100% 97% 98%  Weight:      Height:        General: Appear in mild distress, no Rash; Oral Mucosa Clear, moist. no Abnormal Neck Mass Or lumps, Conjunctiva normal  Cardiovascular: S1 and S2 Present, no Murmur, Respiratory: good respiratory effort, Bilateral Air entry present and CTA, no Crackles, no wheezes Abdomen: Bowel Sound present, Soft and no tenderness Extremities: no Pedal edema Neurology: alert and oriented to time, place, and person affect appropriate. no new focal deficit Gait not checked due to patient safety  concerns   Data Reviewed:  CBC shows stable hemoglobin.  COVID test negative.  UA negative for any acute abnormality.  I ordered CBC and BMP tomorrow  Family Communication: Daughter at bedside.  Disposition: Status is: Observation     Author: Berle Mull, MD 01/20/2022 6:44 PM  For on call review [email or web address].

## 2022-01-20 NOTE — Assessment & Plan Note (Addendum)
Mechanical fall. Patient presents with a mechanical fall with pain in her left side. Imaging confirms presence of left superior and inferior pubic rami fractures without any hip fracture. Conservative management recommended. PT recommends SNF. Weightbearing as tolerated. Continue current pain control with tylenol. Tramadol prescribed in case needed although the pt has not needed any opioids.

## 2022-01-20 NOTE — Plan of Care (Signed)
  Problem: Clinical Measurements: Goal: Ability to maintain clinical measurements within normal limits will improve Outcome: Progressing   Problem: Activity: Goal: Risk for activity intolerance will decrease Outcome: Progressing   Problem: Coping: Goal: Level of anxiety will decrease Outcome: Progressing   

## 2022-01-20 NOTE — Assessment & Plan Note (Signed)
Blood pressure stable.  On lisinopril.  Continue.

## 2022-01-20 NOTE — H&P (Signed)
History and Physical    Christine Newton:[id number] DOB: 02/15/1933 DOA: 01/19/2022  PCP: Lillard Anes, MD  Patient coming from: Home  I have personally briefly reviewed patient's old medical records in [place]  Chief Complaint: Fall  HPI: Christine Newton is a 88 y.o. female with medical history significant for dementia, TIA, hypertension, type 2 diabetes, s/p AVR who presents with concerns of a fall.  Daughter at bedside, whom she lives with, provides history.  Earlier today patient was ambulating to the bathroom and daughter heard her cry out.  Found her in the bathroom on the floor.  She had soiled herself but daughter thinks it was due to shock from the fall.  Daughter called her daytime RN and assisted her back to bed but appeared to grimace when hip was moved. Also had less appetite so decided to bring to ER.  Pt normally ambulates with a walker but need help with dressing and bathing. Has daytime RN when daughter is at work. Pt felt her legs were weak and gave out from other her. Daughter thinks she possibly put her brief on wrong restricting her movement.  Denies any lightheadedness, dizziness palpitation.  ED Course: Afebrile, normotensive on room air. WBC of 15 point 3K with left shift.  Hemoglobin stable at 12.9.  CMP unremarkable.  UA is pending at time of admission  CT head and cervical spine were negative.  Chest x-ray negative.  Acute left superior and inferior pubic rami fracture  Review of Systems: pertinent positives and negatives as above as patient is limited historian  Past Medical History:  Diagnosis Date   Diabetes ([place])    Hypertension    Nonrheumatic aortic (valve) stenosis 09/21/2020    Past Surgical History:  Procedure Laterality Date   CARDIAC VALVE REPLACEMENT     CHOLECYSTECTOMY       reports that she has never smoked. She has never used smokeless tobacco. She reports that she does not drink alcohol and does not use  drugs. Social History  Allergies  Allergen Reactions   Cephalosporins Nausea Only and Other (See Comments)    Other Reaction: Allergy    History reviewed. No pertinent family history.   Prior to Admission medications   Medication Sig Start Date End Date Taking? Authorizing Provider  ACCU-CHEK GUIDE test strip 1 EACH BY OTHER ROUTE 2 (TWO) TIMES DAILY AS NEEDED. USE AS INSTRUCTED 03/06/21   Lillard Anes, MD  Accu-Chek Softclix Lancets lancets 1 EACH BY DOES NOT APPLY ROUTE 3 (THREE) TIMES DAILY. 03/03/21   Lillard Anes, MD  aspirin EC 81 MG tablet Take 1 tablet (81 mg total) by mouth daily. Swallow whole. 01/21/21   Frann Rider, NP  atorvastatin (LIPITOR) 20 MG tablet Take 1 tablet (20 mg total) by mouth daily. 12/16/20   Dwyane Dee, MD  Blood Glucose Monitoring Suppl (ACCU-CHEK GUIDE) w/Device KIT 1 each by Does not apply route 2 (two) times daily as needed. 06/18/20   Lillard Anes, MD  Lancets Misc. (ACCU-CHEK SOFTCLIX LANCET DEV) KIT 1 each by Does not apply route 3 (three) times daily. 02/05/21   Lillard Anes, MD  lisinopril (ZESTRIL) 20 MG tablet TAKE 1 TABLET BY MOUTH EVERY DAY 12/28/21   Lillard Anes, MD  metFORMIN (GLUCOPHAGE) 500 MG tablet Take 500 mg by mouth daily with breakfast. 07/07/19   [provider]    Physical Exam: Vitals:   01/19/22 2345 01/20/22 0000 01/20/22 0015 01/20/22 0139  BP: 136/77 135/90 (!) 150/85   Pulse: 86 73 88   Resp: '20 13 16   ' SpO2: 94% 96% 96%   Height:    '5\' 6"'  (1.676 m)    Constitutional: NAD, calm, comfortable, elderly female initially laying flat in bed asleep.  Awoke easily to voice Vitals:   01/19/22 2345 01/20/22 0000 01/20/22 0015 01/20/22 0139  BP: 136/77 135/90 (!) 150/85   Pulse: 86 73 88   Resp: '20 13 16   ' SpO2: 94% 96% 96%   Height:    '5\' 6"'  (1.676 m)   Eyes: lids and conjunctivae normal ENMT: Mucous membranes are moist.  Neck: normal, supple, Respiratory: clear  to auscultation bilaterally, no wheezing, no crackles. Normal respiratory effort.  Cardiovascular: Regular rate and rhythm, no murmurs / rubs / gallops. No extremity edema.  Abdomen: no tenderness,  Bowel sounds positive.  Musculoskeletal: no clubbing / cyanosis. No joint deformity upper and lower extremities. Good ROM, no contractures. Normal muscle tone.  Skin: no rashes, lesions, ulcers. No induration Neurologic: CN 2-12 grossly intact. Strength 4/5 in all lower extremity. Good hand grip to the left but weaker on right. Psychiatric: Alert and oriented to person place and current event but not time.  Flat affect..     Labs on Admission: I have personally reviewed following labs and imaging studies  CBC: Recent Labs  Lab 01/19/22 1850  WBC 15.3*  NEUTROABS 11.8*  HGB 12.9  HCT 40.2  MCV 91.4  PLT 037   Basic Metabolic Panel: Recent Labs  Lab 01/19/22 1850  NA 141  K 4.1  CL 108  CO2 25  GLUCOSE 118*  BUN 24*  CREATININE 0.96  CALCIUM 8.5*   GFR: CrCl cannot be calculated (Unknown ideal weight.). Liver Function Tests: No results for input(s): AST, ALT, ALKPHOS, BILITOT, PROT, ALBUMIN in the last 168 hours. No results for input(s): LIPASE, AMYLASE in the last 168 hours. No results for input(s): AMMONIA in the last 168 hours. Coagulation Profile: No results for input(s): INR, PROTIME in the last 168 hours. Cardiac Enzymes: No results for input(s): CKTOTAL, CKMB, CKMBINDEX, TROPONINI in the last 168 hours. BNP (last 3 results) No results for input(s): PROBNP in the last 8760 hours. HbA1C: No results for input(s): HGBA1C in the last 72 hours. CBG: No results for input(s): GLUCAP in the last 168 hours. Lipid Profile: No results for input(s): CHOL, HDL, LDLCALC, TRIG, CHOLHDL, LDLDIRECT in the last 72 hours. Thyroid Function Tests: No results for input(s): TSH, T4TOTAL, FREET4, T3FREE, THYROIDAB in the last 72 hours. Anemia Panel: No results for input(s):  VITAMINB12, FOLATE, FERRITIN, TIBC, IRON, RETICCTPCT in the last 72 hours. Urine analysis:    Component Value Date/Time   COLORURINE YELLOW 12/13/2020 1838   APPEARANCEUR HAZY (A) 12/13/2020 1838   LABSPEC 1.006 12/13/2020 1838   PHURINE 6.0 12/13/2020 1838   GLUCOSEU NEGATIVE 12/13/2020 1838   HGBUR SMALL (A) 12/13/2020 1838   BILIRUBINUR Negative 11/01/2021 1152   KETONESUR negative 10/07/2021 [place] 12/13/2020 1838   PROTEINUR Negative 11/01/2021 1152   PROTEINUR NEGATIVE 12/13/2020 1838   UROBILINOGEN 0.2 11/01/2021 1152   NITRITE negative 11/01/2021 1152   NITRITE NEGATIVE 12/13/2020 1838   LEUKOCYTESUR Trace (A) 11/01/2021 1152   LEUKOCYTESUR TRACE (A) 12/13/2020 1838    Radiological Exams on Admission: CT Head Wo Contrast  Result Date: 01/19/2022 CLINICAL DATA:  Neck trauma. EXAM: CT HEAD WITHOUT CONTRAST CT CERVICAL SPINE WITHOUT CONTRAST TECHNIQUE: Multidetector CT imaging of  the head and cervical spine was performed following the standard protocol without intravenous contrast. Multiplanar CT image reconstructions of the cervical spine were also generated. RADIATION DOSE REDUCTION: This exam was performed according to the departmental dose-optimization program which includes automated exposure control, adjustment of the mA and/or kV according to patient size and/or use of iterative reconstruction technique. COMPARISON:  None. FINDINGS: CT HEAD FINDINGS Brain: Moderate age-related atrophy and chronic microvascular ischemic changes. There is no acute intracranial hemorrhage. No mass effect or midline shift. No extra-axial fluid collection. Vascular: No hyperdense vessel or unexpected calcification. Skull: Normal. Negative for fracture or focal lesion. Sinuses/Orbits: There is mucoperiosteal thickening of paranasal sinuses with partial opacification of the sphenoid sinuses. No air-fluid level. The mastoid air cells are clear. Other: None CT CERVICAL SPINE FINDINGS  Alignment: No acute subluxation. Skull base and vertebrae: No acute fracture.  Osteopenia. Soft tissues and spinal canal: No prevertebral fluid or swelling. No visible canal hematoma. Disc levels: Multilevel degenerative changes with anterior spurring and osteophyte. Upper chest: Negative. Other: Goiter with multiple calcified nodule. In the setting of significant comorbidities or limited life expectancy, no follow-up recommended (ref: J Am Coll Radiol. 2015 Feb;12(2): 143-50). IMPRESSION: 1. No acute intracranial pathology. Moderate age-related atrophy and chronic microvascular ischemic changes. 2. No acute fracture or subluxation of the cervical spine. Electronically Signed   By: Anner Crete M.D.   On: 01/19/2022 19:38   CT Cervical Spine Wo Contrast  Result Date: 01/19/2022 CLINICAL DATA:  Neck trauma. EXAM: CT HEAD WITHOUT CONTRAST CT CERVICAL SPINE WITHOUT CONTRAST TECHNIQUE: Multidetector CT imaging of the head and cervical spine was performed following the standard protocol without intravenous contrast. Multiplanar CT image reconstructions of the cervical spine were also generated. RADIATION DOSE REDUCTION: This exam was performed according to the departmental dose-optimization program which includes automated exposure control, adjustment of the mA and/or kV according to patient size and/or use of iterative reconstruction technique. COMPARISON:  None. FINDINGS: CT HEAD FINDINGS Brain: Moderate age-related atrophy and chronic microvascular ischemic changes. There is no acute intracranial hemorrhage. No mass effect or midline shift. No extra-axial fluid collection. Vascular: No hyperdense vessel or unexpected calcification. Skull: Normal. Negative for fracture or focal lesion. Sinuses/Orbits: There is mucoperiosteal thickening of paranasal sinuses with partial opacification of the sphenoid sinuses. No air-fluid level. The mastoid air cells are clear. Other: None CT CERVICAL SPINE FINDINGS Alignment: No  acute subluxation. Skull base and vertebrae: No acute fracture.  Osteopenia. Soft tissues and spinal canal: No prevertebral fluid or swelling. No visible canal hematoma. Disc levels: Multilevel degenerative changes with anterior spurring and osteophyte. Upper chest: Negative. Other: Goiter with multiple calcified nodule. In the setting of significant comorbidities or limited life expectancy, no follow-up recommended (ref: J Am Coll Radiol. 2015 Feb;12(2): 143-50). IMPRESSION: 1. No acute intracranial pathology. Moderate age-related atrophy and chronic microvascular ischemic changes. 2. No acute fracture or subluxation of the cervical spine. Electronically Signed   By: Anner Crete M.D.   On: 01/19/2022 19:38   DG Chest Portable 1 View  Result Date: 01/19/2022 CLINICAL DATA:  Concern for fracture.  Fall. EXAM: PORTABLE CHEST 1 VIEW COMPARISON:  12/13/2020 FINDINGS: Postop aortic valve replacement. Heart size and vascularity normal. Lungs clear without infiltrate or effusion. Calcified fibroadenomas in the right breast. IMPRESSION: No active disease. Electronically Signed   By: Franchot Gallo M.D.   On: 01/19/2022 19:13   DG Hips Bilat W or Wo Pelvis 5 Views  Result Date: 01/19/2022 CLINICAL DATA:  Concern for fracture, fall EXAM: DG HIP (WITH OR WITHOUT PELVIS) 5+V BILAT COMPARISON:  None. FINDINGS: SI joints are non widened. Pubic symphysis is intact. Old appearing fracture deformity involving the right superior pubic ramus. Suspect acute nondisplaced left superior and inferior pubic rami fractures. Both femoral heads are normally position. There are mild degenerative changes of the bilateral hips IMPRESSION: Suspect acute left superior and inferior pubic rami fractures Electronically Signed   By: Donavan Foil M.D.   On: 01/19/2022 19:25      Assessment/Plan  Acute left-sided superior and inferior pubic rami fracture s/p mechanical fall -Apply K pad, Lidoderm patch -PRN Norco for severe  pain -PT eval in the morning. Pt normally ambulates with walker   HTN  continue lisinopril  Hx of AVR continue aspirin  Type 2 diabetes Controlled.  Last hemoglobin A1c on 10/2021 of 6.7  Hyperlipidemia Continue atorvastatin  Dementia Patient alert and oriented to self, daughter at bedside and current president -At baseline needs help with bathing and dressing herself  DVT prophylaxis:.Lovenox Code Status: DNR-daughter reports she has documentation at home Family Communication: Plan discussed with patient and daughter at bedside  disposition Plan: Home with observation Consults called:  Admission status: Observation  Level of care: Med-Surg  Status is: Observation  The patient remains OBS appropriate and will d/c before 2 midnights.        Orene Desanctis DO [facility]   If 7PM-7AM, please contact night-coverage [email or web address]   01/20/2022, 1:51 AM

## 2022-01-21 ENCOUNTER — Observation Stay (HOSPITAL_COMMUNITY): Payer: Medicare PPO

## 2022-01-21 DIAGNOSIS — S32592A Other specified fracture of left pubis, initial encounter for closed fracture: Secondary | ICD-10-CM | POA: Diagnosis not present

## 2022-01-21 DIAGNOSIS — K5900 Constipation, unspecified: Secondary | ICD-10-CM | POA: Diagnosis not present

## 2022-01-21 LAB — RESP PANEL BY RT-PCR (FLU A&B, COVID) ARPGX2
Influenza A by PCR: NEGATIVE
Influenza B by PCR: NEGATIVE
SARS Coronavirus 2 by RT PCR: NEGATIVE

## 2022-01-21 LAB — GLUCOSE, CAPILLARY
Glucose-Capillary: 261 mg/dL — ABNORMAL HIGH (ref 70–99)
Glucose-Capillary: 216 mg/dL — ABNORMAL HIGH (ref 70–99)
Glucose-Capillary: 227 mg/dL — ABNORMAL HIGH (ref 70–99)

## 2022-01-21 IMAGING — DX DG ABD PORTABLE 1V
1 series · 1 of 1 positions shown · non-contrast
Comparison: None.

CLINICAL DATA: Constipation

EXAM:
PORTABLE ABDOMEN - 1 VIEW

[abdomen kub]
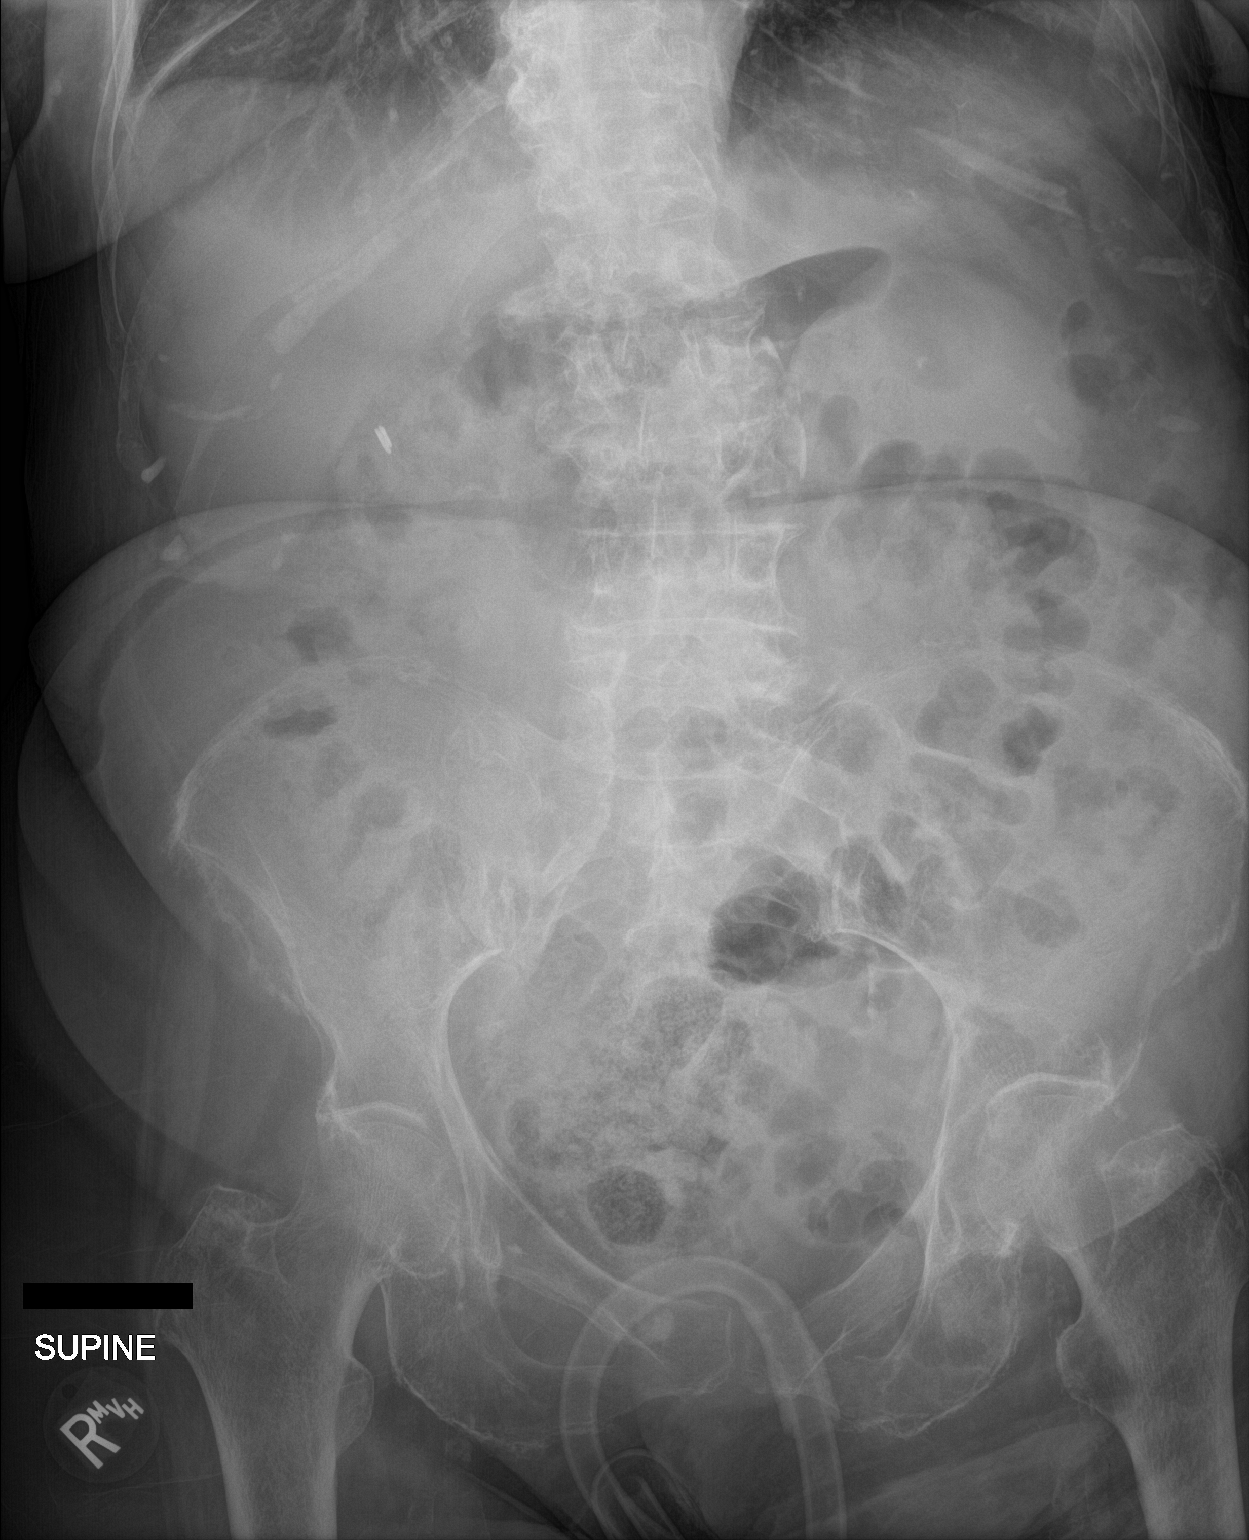

[1 of 1 positions shown; findings below may reference images not displayed]

FINDINGS: Bowel gas pattern is unremarkable. There is moderate stool burden
primarily at the rectosigmoid. Cholecystectomy clips. Decreased
osseous mineralization with degenerative changes of the spine and
hips.
IMPRESSION: Moderate stool burden primarily at the rectosigmoid.

## 2022-01-21 MED ORDER — SENNOSIDES-DOCUSATE SODIUM 8.6-50 MG PO TABS
1.0000 | ORAL_TABLET | Freq: Two times a day (BID) | ORAL | Status: DC
  Administered 2022-01-21: 1 via ORAL
  Filled 2022-01-21 (×2): qty 1

## 2022-01-21 MED ORDER — INSULIN ASPART 100 UNIT/ML IJ SOLN
0.0000 [IU] | Freq: Every day | INTRAMUSCULAR | Status: DC
  Administered 2022-01-21: 2 [IU] via SUBCUTANEOUS

## 2022-01-21 MED ORDER — INSULIN ASPART 100 UNIT/ML IJ SOLN
0.0000 [IU] | Freq: Three times a day (TID) | INTRAMUSCULAR | Status: DC
  Administered 2022-01-21 – 2022-01-22 (×2): 3 [IU] via SUBCUTANEOUS
  Administered 2022-01-22: 1 [IU] via SUBCUTANEOUS

## 2022-01-21 MED ORDER — ACETAMINOPHEN 500 MG PO TABS
500.0000 mg | ORAL_TABLET | Freq: Three times a day (TID) | ORAL | Status: DC
  Administered 2022-01-22: 500 mg via ORAL
  Filled 2022-01-21 (×2): qty 1

## 2022-01-21 MED ORDER — BISACODYL 10 MG RE SUPP
10.0000 mg | Freq: Once | RECTAL | Status: AC
  Administered 2022-01-21: 10 mg via RECTAL
  Filled 2022-01-21: qty 1

## 2022-01-21 MED ORDER — POLYETHYLENE GLYCOL 3350 17 G PO PACK
17.0000 g | PACK | Freq: Every day | ORAL | Status: DC
  Administered 2022-01-21: 17 g via ORAL
  Filled 2022-01-21 (×2): qty 1

## 2022-01-21 NOTE — TOC Initial Note (Addendum)
Transition of Care [facility]) - Initial/Assessment Note   Patient Details  Name: Christine Newton MRN: [id number] Date of Birth: 09-Nov-1933  [facility]) CM/SW Contact:    Sherie Don, LCSW Phone Number: 01/21/2022, 9:49 AM  Clinical Narrative: PT evaluation recommended SNF. CSW spoke with daughter, Christine Newton, to discuss recommendations. Daughter and patient are agreeable to rehab and would like a facility close to where they live in [place].  FL2 done; PASRR confirmed. Initial referral faxed out. CSW reviewed bed offers with daughter and patient selected Christine Newton. CSW confirmed with Soy in admissions that the patient can be admitted tomorrow if daughter completes admission paperwork today. Daughter is agreeable.  CSW started insurance authorization in [facility] portal. Reference ID # is: [id number]. TOC awaiting insurance approval.  Addendum: Per Soy, patient will go to room [address] [place] and the number for report is [phone].  Insurance authorization has been approved for 01/22/2022-01/25/2022.  Expected Discharge Plan: Skilled Nursing Facility Barriers to Discharge: Continued Medical Work up  Patient Goals and CMS Choice Patient states their goals for this hospitalization and ongoing recovery are:: Go to short-term rehab and then return home with daughter CMS Medicare.gov Compare Post Acute Care list provided to:: Patient Represenative (must comment) Choice offered to / list presented to : Adult Children  Expected Discharge Plan and Services Expected Discharge Plan: [place] In-house Referral: Clinical Social Work Post Acute Care Choice: [place] Living arrangements for the past 2 months: Apartment          DME Arranged: N/A DME Agency: NA  Prior Living Arrangements/Services Living arrangements for the past 2 months: Apartment Lives with:: Adult Children Patient language and need for interpreter reviewed:: Yes Do you feel  safe going back to the place where you live?: Yes      Need for Family Participation in Patient Care: Yes (Comment) (Patient has dementia.) Care giver support system in place?: Yes (comment) Criminal Activity/Legal Involvement Pertinent to Current Situation/Hospitalization: No - Comment as needed  Activities of Daily Living Home Assistive Devices/Equipment: Environmental consultant (specify type), Cane (specify quad or straight), Wheelchair, Built-in shower seat, Hospital bed, Raised toilet seat with rails, Grab bars in shower ADL Screening (condition at time of admission) Patient's cognitive ability adequate to safely complete daily activities?: No Is the patient deaf or have difficulty hearing?: No Does the patient have difficulty seeing, even when wearing glasses/contacts?: No Does the patient have difficulty concentrating, remembering, or making decisions?: Yes Patient able to express need for assistance with ADLs?: Yes Does the patient have difficulty dressing or bathing?: Yes Independently performs ADLs?: No Communication: Independent Dressing (OT): Needs assistance Is this a change from baseline?: Pre-admission baseline Grooming: Needs assistance Is this a change from baseline?: Pre-admission baseline Feeding: Independent Bathing: Needs assistance Is this a change from baseline?: Pre-admission baseline Toileting: Needs assistance Is this a change from baseline?: Pre-admission baseline In/Out Bed: Needs assistance Is this a change from baseline?: Pre-admission baseline Walks in Home: Needs assistance Is this a change from baseline?: Pre-admission baseline Does the patient have difficulty walking or climbing stairs?: Yes Weakness of Legs: None Weakness of Arms/Hands: None  Permission Sought/Granted Permission sought to share information with : Chartered certified accountant granted to share information with : Yes, Verbal Permission Granted Permission granted to share info w AGENCY:  SNFs  Emotional Assessment Orientation: : Oriented to Self, Oriented to Place Alcohol / Substance Use: Not Applicable Psych Involvement: No (comment)  Admission diagnosis:  Pubic  ramus fracture ([place]) [S32.599A] Fall, initial encounter [W19.XXXA] Multiple closed fractures of pelvis without disruption of pelvic ring, initial encounter ([place]) [S32.82XA] Patient Active Problem List   Diagnosis Date Noted   Type 2 diabetes mellitus with hyperlipidemia ([place]) 01/20/2022   Closed fracture of left inferior and superior pubic ramus ([place]) 01/19/2022   Dementia ([place]) 11/01/2021   Acute cystitis without hematuria 10/07/2021   Mass in region of sella turcica present on magnetic resonance imaging 12/21/2020   Thyroid cyst 12/14/2020   Nonrheumatic aortic (valve) stenosis 09/21/2020   Obesity, diabetes, and hypertension syndrome ([place]) 09/17/2020   BMI 24.0-24.9, adult 09/17/2020   S/P AVR (aortic valve replacement) 07/13/2018   Hyperlipidemia 12/20/2013   Adenoma of pituitary ([place]) 11/04/2013   Essential hypertension 11/04/2013   History of colon cancer 11/04/2013   PCP:  Lillard Anes, MD Pharmacy:   [id number] - [place], [place] [address] [place] [address] Phone: [phone] Fax: [phone]  Readmission Risk Interventions No flowsheet data found.

## 2022-01-21 NOTE — Progress Notes (Signed)
Physical Therapy Treatment Patient Details Name: Christine Newton MRN: [id number] DOB: [birth year] Today's Date: 01/21/2022   History of Present Illness 88 y.o. female with medical history significant for dementia, TIA, hypertension, type 2 diabetes, s/p AVR who presents with concerns of a fall. Dx of L superior and inferior pubic rami fractures.    PT Comments    Pt tolerated increased activity level today, she was able to take a few pivotal steps with a RW from bed to recliner, distance limited by pain. Performed LLE AAROM exercises. Pt's daughter was present for PT session, she requested an order for tylenol for a non-narcotic pain management option, RN notified of this request.     Recommendations for follow up therapy are one component of a multi-disciplinary discharge planning process, led by the attending physician.  Recommendations may be updated based on patient status, additional functional criteria and insurance authorization.  Follow Up Recommendations  Skilled nursing-short term rehab (<3 hours/day)     Assistance Recommended at Discharge Frequent or constant Supervision/Assistance  Patient can return home with the following A lot of help with walking and/or transfers;A lot of help with bathing/dressing/bathroom;Direct supervision/assist for medications management;Assist for transportation;Help with stairs or ramp for entrance;Assistance with cooking/housework;Direct supervision/assist for financial management   Equipment Recommendations  None recommended by PT    Recommendations for Other Services       Precautions / Restrictions Precautions Precautions: Fall Precaution Comments: fell just prior to admission, pt poor historian so not able to provide fall hx Restrictions Weight Bearing Restrictions: No     Mobility  Bed Mobility Overal bed mobility: Needs Assistance Bed Mobility: Supine to Sit     Supine to sit: Max assist, Mod assist     General bed  mobility comments: assist to raise trunk and pivot hips to EOB    Transfers Overall transfer level: Needs assistance Equipment used: Rolling walker (2 wheels) Transfers: Sit to/from Stand, Bed to chair/wheelchair/BSC Sit to Stand: From elevated surface, Min assist Stand pivot transfers: Min assist, Mod assist         General transfer comment: min A sit to stand from elevated bed, VCs for sequencing with RW, pt took a few pivotal steps from bed to recliner, VCs for sequencing, pt had difficulty weight bearing onto LLE, distance limited by pain    Ambulation/Gait               General Gait Details: unable   Stairs             Wheelchair Mobility    Modified Rankin (Stroke Patients Only)       Balance Overall balance assessment: Needs assistance Sitting-balance support: Feet supported, No upper extremity supported Sitting balance-[name]: Fair     Standing balance support: Bilateral upper extremity supported Standing balance-[name]: Poor Standing balance comment: relies on BUE support                            Cognition Arousal/Alertness: Awake/alert Behavior During Therapy: WFL for tasks assessed/performed Overall Cognitive Status: No family/caregiver present to determine baseline cognitive functioning                                 General Comments: dementia at baseline per chart, pt oriented to self but cannot correctly recall her birthdate, knows she's in the hospital due to a fall  Exercises General Exercises - Lower Extremity Ankle Circles/Pumps: AROM, Both, 20 reps, Supine Heel Slides: AAROM, Left, 10 reps, Supine Hip ABduction/ADduction: AAROM, Left, 10 reps, Supine    General Comments        Pertinent Vitals/Pain Pain Assessment Pain Assessment: Faces Faces Pain Scale: Hurts little more Pain Location: L pelvis with movement Pain Descriptors / Indicators: Grimacing, Guarding Pain  Intervention(s): Limited activity within patient's tolerance, Monitored during session, Repositioned    Home Living                          Prior Function            PT Goals (current goals can now be found in the care plan section) Acute Rehab PT Goals PT Goal Formulation: With family Time For Goal Achievement: 02/03/22 Potential to Achieve Goals: Good Progress towards PT goals: Progressing toward goals    Frequency    Min 3X/week      PT Plan Current plan remains appropriate    Co-evaluation              AM-PAC PT "6 Clicks" Mobility   Outcome Measure  Help needed turning from your back to your side while in a flat bed without using bedrails?: A Lot Help needed moving from lying on your back to sitting on the side of a flat bed without using bedrails?: A Lot Help needed moving to and from a bed to a chair (including a wheelchair)?: A Lot Help needed standing up from a chair using your arms (e.g., wheelchair or bedside chair)?: A Lot Help needed to walk in hospital room?: Total Help needed climbing 3-5 steps with a railing? : Total 6 Click Score: 10    End of Session Equipment Utilized During Treatment: Gait belt Activity Tolerance: Patient limited by pain;Patient limited by fatigue Patient left: in chair;with call bell/phone within reach;with chair alarm set;with family/visitor present Nurse Communication: Mobility status;Need for lift equipment;Other (comment) (needs new purewick, family requesting order for tylenol) PT Visit Diagnosis: Difficulty in walking, not elsewhere classified (R26.2);Pain;History of falling (Z91.81);Unsteadiness on feet (R26.81) Pain - Right/Left: Left Pain - part of body: Hip     Time: 1130-1157 PT Time Calculation (min) (ACUTE ONLY): 27 min  Charges:  $Gait Training: 8-22 mins $Therapeutic Activity: 8-22 mins                    Blondell Reveal Kistler PT 01/21/2022  Acute Rehabilitation Services Pager  [phone] Office [phone]

## 2022-01-21 NOTE — NC FL2 (Signed)
[place] LEVEL OF CARE SCREENING TOOL     IDENTIFICATION  Patient Name: Christine Newton Birthdate: November 27, 1933 Sex: female Admission Date (Current Location): 01/19/2022  [facility] and [place] Number:  Herbalist and Address:  [facility],  [place], [place]      Provider Number: [id number]  Attending Physician Name and Address:  Lavina Hamman, MD  Relative Name and Phone Number:  Laural Eiland (daughter) Ph: [phone]    Current Level of Care: Hospital Recommended Level of Care: [place] Prior Approval Number:    Date Approved/Denied:   PASRR Number: [id number]  Discharge Plan: SNF    Current Diagnoses: Patient Active Problem List   Diagnosis Date Noted   Type 2 diabetes mellitus with hyperlipidemia ([place]) 01/20/2022   Closed fracture of left inferior and superior pubic ramus ([place]) 01/19/2022   Dementia ([place]) 11/01/2021   Acute cystitis without hematuria 10/07/2021   Mass in region of sella turcica present on magnetic resonance imaging 12/21/2020   Thyroid cyst 12/14/2020   Nonrheumatic aortic (valve) stenosis 09/21/2020   Obesity, diabetes, and hypertension syndrome ([place]) 09/17/2020   BMI 24.0-24.9, adult 09/17/2020   S/P AVR (aortic valve replacement) 07/13/2018   Hyperlipidemia 12/20/2013   Adenoma of pituitary ([place]) 11/04/2013   Essential hypertension 11/04/2013   History of colon cancer 11/04/2013    Orientation RESPIRATION BLADDER Height & Weight     Self, Place  Normal Incontinent Weight: 140 lb 3.4 oz (63.6 kg) Height:  5\' 6"  (167.6 cm)  BEHAVIORAL SYMPTOMS/MOOD NEUROLOGICAL BOWEL NUTRITION STATUS   (N/A)  (N/A) Continent Diet (Heart healthy)  AMBULATORY STATUS COMMUNICATION OF NEEDS Skin   Total Care Verbally Normal                       Personal Care Assistance Level of Assistance  Bathing, Feeding, Dressing Bathing Assistance: Maximum assistance Feeding assistance:  Limited assistance Dressing Assistance: Maximum assistance     Functional Limitations Info  Sight, Hearing, Speech Sight Info: Adequate Hearing Info: Adequate Speech Info: Adequate    SPECIAL CARE FACTORS FREQUENCY  PT (By licensed PT), OT (By licensed OT)     PT Frequency: 5x's/week OT Frequency: 5x's/week            Contractures Contractures Info: Not present    Additional Factors Info  Code Status, Allergies Code Status Info: DNR Allergies Info: Cephalosporins           Current Medications (01/21/2022):  This is the current hospital active medication list Current Facility-Administered Medications  Medication Dose Route Frequency Provider Last Rate Last Admin   aspirin EC tablet 81 mg  81 mg Oral Daily Tu, Ching T, DO   81 mg at 01/20/22 1011   atorvastatin (LIPITOR) tablet 20 mg  20 mg Oral Daily Tu, Ching T, DO   20 mg at 01/20/22 1011   enoxaparin (LOVENOX) injection 40 mg  40 mg Subcutaneous Q24H Tu, Ching T, DO   40 mg at 01/20/22 1012   HYDROcodone-acetaminophen (NORCO/VICODIN) 5-325 MG per tablet 1 tablet  1 tablet Oral Q6H PRN Tu, Ching T, DO       lidocaine (LIDODERM) 5 % 1 patch  1 patch Transdermal Q24H Tu, Ching T, DO   1 patch at 01/21/22 0015   lisinopril (ZESTRIL) tablet 20 mg  20 mg Oral Daily Tu, Ching T, DO   20 mg at 01/20/22 1011     Discharge  Medications: Please see discharge summary for a list of discharge medications.  Relevant Imaging Results:  Relevant Lab Results:   Additional Information SSN: [id number]  Sherie Don, LCSW

## 2022-01-21 NOTE — Progress Notes (Signed)
°  Progress Note   Patient: Christine Newton YCX:[id number] DOB: 05-07-1933 DOA: 01/19/2022     0 DOS: the patient was seen and examined on 01/21/2022   Brief hospital course: No notes on file  Assessment and Plan * Closed fracture of left inferior and superior pubic ramus ([place])- (present on admission) Mechanical fall. Patient presents with a mechanical fall with pain in her left side. Imaging confirms presence of left superior and inferior pubic rami fractures without any hip fracture. Conservative management recommended. PT recommends SNF. Weightbearing as tolerated. Continue current pain control. Most likely will be able to discharge to SNF on 1/28.  Type 2 diabetes mellitus with hyperlipidemia ([place])- (present on admission) Hemoglobin A1c 6.7. On metformin. Currently holding and sliding scale insulin.  Dementia ([place])- (present on admission) Appears to be well.  No behavioral issues.  S/P AVR (aortic valve replacement) Continue aspirin.  Essential hypertension- (present on admission) Blood pressure stable.  On lisinopril.  Continue.  Constipation. X-ray abdomen shows evidence of rectosigmoid stool burden. Will provide 1 dose of Dulcolax suppository. Also add Senokot and MiraLAX.     Subjective: Pain remains well controlled although patient is actually trying to avoid narcotics.  Willing to take Tylenol.  No nausea no vomiting.  Not passing gas.  No BM.  Last BM was 2 days ago.  Objective Vitals:   01/20/22 1342 01/20/22 2106 01/21/22 0519 01/21/22 1350  BP: 132/72 (!) 155/88 (!) 158/81 119/69  Pulse: 95 84 72 84  Resp: 20 16 16 17   Temp: 97.7 F (36.5 C) 98.5 F (36.9 C) 97.9 F (36.6 C) 98.3 F (36.8 C)  TempSrc: Oral Oral Oral   SpO2: 98% 95% 98% 99%  Weight:      Height:       General: Appear in mild distress; no visible Abnormal Neck Mass Or lumps, Conjunctiva normal Cardiovascular: S1 and S2 Present, aortic systolic  Murmur, Respiratory: good  respiratory effort, Bilateral Air entry present and CTA, no Crackles, no wheezes Abdomen: Bowel Sound present Extremities: no Pedal edema Neurology: alert and oriented to time, place, and person Gait not checked due to patient safety concerns   Data Reviewed:  X-ray abdomen shows stool burden in the rectum. CBG elevated.  Family Communication: Daughter at bedside.  Disposition: Status is: Observation    Author: Berle Mull, MD 01/21/2022 7:52 PM  For on call review [email or web address].

## 2022-01-22 DIAGNOSIS — Z9181 History of falling: Secondary | ICD-10-CM | POA: Diagnosis not present

## 2022-01-22 DIAGNOSIS — S92322D Displaced fracture of second metatarsal bone, left foot, subsequent encounter for fracture with routine healing: Secondary | ICD-10-CM | POA: Diagnosis not present

## 2022-01-22 DIAGNOSIS — Z20822 Contact with and (suspected) exposure to covid-19: Secondary | ICD-10-CM | POA: Diagnosis not present

## 2022-01-22 DIAGNOSIS — S32599A Other specified fracture of unspecified pubis, initial encounter for closed fracture: Secondary | ICD-10-CM | POA: Diagnosis not present

## 2022-01-22 DIAGNOSIS — F0390 Unspecified dementia without behavioral disturbance: Secondary | ICD-10-CM | POA: Diagnosis not present

## 2022-01-22 DIAGNOSIS — Z7401 Bed confinement status: Secondary | ICD-10-CM | POA: Diagnosis not present

## 2022-01-22 DIAGNOSIS — I10 Essential (primary) hypertension: Secondary | ICD-10-CM | POA: Diagnosis not present

## 2022-01-22 DIAGNOSIS — E785 Hyperlipidemia, unspecified: Secondary | ICD-10-CM | POA: Diagnosis not present

## 2022-01-22 DIAGNOSIS — S32592A Other specified fracture of left pubis, initial encounter for closed fracture: Secondary | ICD-10-CM | POA: Diagnosis not present

## 2022-01-22 DIAGNOSIS — S3282XD Multiple fractures of pelvis without disruption of pelvic ring, subsequent encounter for fracture with routine healing: Secondary | ICD-10-CM | POA: Diagnosis not present

## 2022-01-22 DIAGNOSIS — R2681 Unsteadiness on feet: Secondary | ICD-10-CM | POA: Diagnosis not present

## 2022-01-22 DIAGNOSIS — R531 Weakness: Secondary | ICD-10-CM | POA: Diagnosis not present

## 2022-01-22 DIAGNOSIS — M170 Bilateral primary osteoarthritis of knee: Secondary | ICD-10-CM | POA: Diagnosis not present

## 2022-01-22 DIAGNOSIS — R41841 Cognitive communication deficit: Secondary | ICD-10-CM | POA: Diagnosis not present

## 2022-01-22 DIAGNOSIS — Z7982 Long term (current) use of aspirin: Secondary | ICD-10-CM | POA: Diagnosis not present

## 2022-01-22 DIAGNOSIS — K5900 Constipation, unspecified: Secondary | ICD-10-CM | POA: Diagnosis not present

## 2022-01-22 DIAGNOSIS — E119 Type 2 diabetes mellitus without complications: Secondary | ICD-10-CM | POA: Diagnosis not present

## 2022-01-22 DIAGNOSIS — G40909 Epilepsy, unspecified, not intractable, without status epilepticus: Secondary | ICD-10-CM | POA: Diagnosis not present

## 2022-01-22 DIAGNOSIS — M6281 Muscle weakness (generalized): Secondary | ICD-10-CM | POA: Diagnosis not present

## 2022-01-22 DIAGNOSIS — Z4789 Encounter for other orthopedic aftercare: Secondary | ICD-10-CM | POA: Diagnosis not present

## 2022-01-22 DIAGNOSIS — Z23 Encounter for immunization: Secondary | ICD-10-CM | POA: Diagnosis not present

## 2022-01-22 DIAGNOSIS — Z8673 Personal history of transient ischemic attack (TIA), and cerebral infarction without residual deficits: Secondary | ICD-10-CM | POA: Diagnosis not present

## 2022-01-22 DIAGNOSIS — S93325D Dislocation of tarsometatarsal joint of left foot, subsequent encounter: Secondary | ICD-10-CM | POA: Diagnosis not present

## 2022-01-22 LAB — GLUCOSE, CAPILLARY
Glucose-Capillary: 140 mg/dL — ABNORMAL HIGH (ref 70–99)
Glucose-Capillary: 232 mg/dL — ABNORMAL HIGH (ref 70–99)

## 2022-01-22 MED ORDER — TRAMADOL HCL 50 MG PO TABS: 50.0000 mg | ORAL_TABLET | Freq: Two times a day (BID) | ORAL | 0 refills | Status: DC | PRN

## 2022-01-22 MED ORDER — POLYETHYLENE GLYCOL 3350 17 G PO PACK: 17.0000 g | PACK | Freq: Every day | ORAL | 0 refills | Status: DC

## 2022-01-22 MED ORDER — INFLUENZA VAC A&B SA ADJ QUAD 0.5 ML IM PRSY
0.5000 mL | PREFILLED_SYRINGE | Freq: Once | INTRAMUSCULAR | Status: AC
  Administered 2022-01-22: 0.5 mL via INTRAMUSCULAR
  Filled 2022-01-22: qty 0.5

## 2022-01-22 MED ORDER — PNEUMOCOCCAL 20-VAL CONJ VACC 0.5 ML IM SUSY
0.5000 mL | PREFILLED_SYRINGE | Freq: Once | INTRAMUSCULAR | Status: AC
  Administered 2022-01-22: 0.5 mL via INTRAMUSCULAR
  Filled 2022-01-22 (×2): qty 0.5

## 2022-01-22 MED ORDER — LIDOCAINE 5 % EX PTCH: 1.0000 | MEDICATED_PATCH | CUTANEOUS | 0 refills | Status: DC

## 2022-01-22 MED ORDER — ACETAMINOPHEN 500 MG PO TABS: 500.0000 mg | ORAL_TABLET | ORAL | 0 refills | Status: DC | PRN

## 2022-01-22 MED ORDER — PNEUMOCOCCAL 20-VAL CONJ VACC 0.5 ML IM SUSY
0.5000 mL | PREFILLED_SYRINGE | INTRAMUSCULAR | Status: DC
  Filled 2022-01-22: qty 0.5

## 2022-01-22 NOTE — Discharge Summary (Signed)
Physician Discharge Summary   Patient: Christine Newton MRN: [id number] DOB: 30-Aug-1933  Admit date:     01/19/2022  Discharge date: 01/22/22  Discharge Physician: Berle Mull   PCP: Lillard Anes, MD   Recommendations at discharge:    Follow up with PCP as well as Orthopedics as recommended  Discharge Diagnoses Principal Problem:   Closed fracture of left inferior and superior pubic ramus (HCC) Active Problems:   Essential hypertension   S/P AVR (aortic valve replacement)   Dementia (HCC)   Type 2 diabetes mellitus with hyperlipidemia (HCC)  Resolved Problems:   * No resolved hospital problems. [facility] Course   No notes on file  * Closed fracture of left inferior and superior pubic ramus ([place])- (present on admission) Mechanical fall. Patient presents with a mechanical fall with pain in her left side. Imaging confirms presence of left superior and inferior pubic rami fractures without any hip fracture. Conservative management recommended. PT recommends SNF. Weightbearing as tolerated. Continue current pain control with tylenol. Tramadol prescribed in case needed although the pt has not needed any opioids.  Type 2 diabetes mellitus with hyperlipidemia ([place])- (present on admission) Hemoglobin A1c 6.7. On metformin. CBG somewhat elevated but anticipate improvement in control with diet management.   Dementia ([place])- (present on admission) Appears to be well.  No behavioral issues.  S/P AVR (aortic valve replacement) Continue aspirin.  Essential hypertension- (present on admission) Blood pressure stable.  On lisinopril.  Continue.      Pain control - [employer] Controlled Substance Reporting System database was reviewed. and patient was instructed, not to drive, operate heavy machinery, perform activities at heights, swimming or participation in water activities or provide baby-sitting services while on Pain, Sleep and Anxiety Medications; until  their outpatient Physician has advised to do so again. Also recommended to not to take more than prescribed Pain, Sleep and Anxiety Medications.   Consultants: none Procedures performed: none  Disposition: Skilled nursing facility Diet recommendation: Carb modified diet  DISCHARGE MEDICATION: Allergies as of 01/22/2022       Reactions   Cephalosporins Nausea Only, Other (See Comments)   Other Reaction: Allergy        Medication List     TAKE these medications    Accu-Chek Guide test strip Generic drug: glucose blood 1 EACH BY OTHER ROUTE 2 (TWO) TIMES DAILY AS NEEDED. USE AS INSTRUCTED   Accu-Chek Guide w/Device Kit 1 each by Does not apply route 2 (two) times daily as needed.   Accu-Chek Softclix Lancet Dev Kit 1 each by Does not apply route 3 (three) times daily.   Accu-Chek Softclix Lancets lancets 1 EACH BY DOES NOT APPLY ROUTE 3 (THREE) TIMES DAILY.   acetaminophen 500 MG tablet Commonly known as: TYLENOL Take 1 tablet (500 mg total) by mouth every 4 (four) hours as needed.   aspirin EC 81 MG tablet Take 1 tablet (81 mg total) by mouth daily. Swallow whole.   atorvastatin 20 MG tablet Commonly known as: LIPITOR Take 1 tablet (20 mg total) by mouth daily.   lidocaine 5 % Commonly known as: LIDODERM Place 1 patch onto the skin daily. Remove & Discard patch within 12 hours or as directed by MD Start taking on: January 23, 2022   lisinopril 20 MG tablet Commonly known as: ZESTRIL TAKE 1 TABLET BY MOUTH EVERY DAY   metFORMIN 500 MG tablet Commonly known as: GLUCOPHAGE Take 500 mg by mouth daily with breakfast.   polyethylene  glycol 17 g packet Commonly known as: MIRALAX / GLYCOLAX Take 17 g by mouth daily. Start taking on: January 23, 2022   traMADol 50 MG tablet Commonly known as: Ultram Take 1 tablet (50 mg total) by mouth every 12 (twelve) hours as needed.        Contact information for follow-up providers     Lillard Anes, MD.  Schedule an appointment as soon as possible for a visit in 1 week(s).   Specialty: Family Medicine Contact information: [address] [place] [address] [phone]         Mcarthur Rossetti, MD. Schedule an appointment as soon as possible for a visit in 2 week(s).   Specialty: Orthopedic Surgery Contact information: [place] [address] [phone]              Contact information for after-discharge care     Destination     [facility] .   Service: Skilled Nursing Contact information: [address] [place] [phone]                     Discharge Exam: Danley Danker Weights   01/20/22 0139  Weight: 63.6 kg   General: Appear in mild distress; no visible Abnormal Neck Mass Or lumps, Conjunctiva normal Cardiovascular: S1 and S2 Present, no Murmur, Respiratory: good respiratory effort, Bilateral Air entry present and CTA, no Crackles, no wheezes Abdomen: Bowel Sound present Extremities: no Pedal edema Neurology: alert and oriented to time, place, and person Gait not checked due to patient safety concerns   Condition at discharge: good  The results of significant diagnostics from this hospitalization (including imaging, microbiology, ancillary and laboratory) are listed below for reference.   Imaging Studies: CT Head Wo Contrast  Result Date: 01/19/2022 CLINICAL DATA:  Neck trauma. EXAM: CT HEAD WITHOUT CONTRAST CT CERVICAL SPINE WITHOUT CONTRAST TECHNIQUE: Multidetector CT imaging of the head and cervical spine was performed following the standard protocol without intravenous contrast. Multiplanar CT image reconstructions of the cervical spine were also generated. RADIATION DOSE REDUCTION: This exam was performed according to the departmental dose-optimization program which includes automated exposure control, adjustment of the mA and/or kV according to patient size and/or use of  iterative reconstruction technique. COMPARISON:  None. FINDINGS: CT HEAD FINDINGS Brain: Moderate age-related atrophy and chronic microvascular ischemic changes. There is no acute intracranial hemorrhage. No mass effect or midline shift. No extra-axial fluid collection. Vascular: No hyperdense vessel or unexpected calcification. Skull: Normal. Negative for fracture or focal lesion. Sinuses/Orbits: There is mucoperiosteal thickening of paranasal sinuses with partial opacification of the sphenoid sinuses. No air-fluid level. The mastoid air cells are clear. Other: None CT CERVICAL SPINE FINDINGS Alignment: No acute subluxation. Skull base and vertebrae: No acute fracture.  Osteopenia. Soft tissues and spinal canal: No prevertebral fluid or swelling. No visible canal hematoma. Disc levels: Multilevel degenerative changes with anterior spurring and osteophyte. Upper chest: Negative. Other: Goiter with multiple calcified nodule. In the setting of significant comorbidities or limited life expectancy, no follow-up recommended (ref: J Am Coll Radiol. 2015 Feb;12(2): 143-50). IMPRESSION: 1. No acute intracranial pathology. Moderate age-related atrophy and chronic microvascular ischemic changes. 2. No acute fracture or subluxation of the cervical spine. Electronically Signed   By: Anner Crete M.D.   On: 01/19/2022 19:38   CT Cervical Spine Wo Contrast  Result Date: 01/19/2022 CLINICAL DATA:  Neck trauma. EXAM: CT HEAD WITHOUT CONTRAST CT CERVICAL  SPINE WITHOUT CONTRAST TECHNIQUE: Multidetector CT imaging of the head and cervical spine was performed following the standard protocol without intravenous contrast. Multiplanar CT image reconstructions of the cervical spine were also generated. RADIATION DOSE REDUCTION: This exam was performed according to the departmental dose-optimization program which includes automated exposure control, adjustment of the mA and/or kV according to patient size and/or use of iterative  reconstruction technique. COMPARISON:  None. FINDINGS: CT HEAD FINDINGS Brain: Moderate age-related atrophy and chronic microvascular ischemic changes. There is no acute intracranial hemorrhage. No mass effect or midline shift. No extra-axial fluid collection. Vascular: No hyperdense vessel or unexpected calcification. Skull: Normal. Negative for fracture or focal lesion. Sinuses/Orbits: There is mucoperiosteal thickening of paranasal sinuses with partial opacification of the sphenoid sinuses. No air-fluid level. The mastoid air cells are clear. Other: None CT CERVICAL SPINE FINDINGS Alignment: No acute subluxation. Skull base and vertebrae: No acute fracture.  Osteopenia. Soft tissues and spinal canal: No prevertebral fluid or swelling. No visible canal hematoma. Disc levels: Multilevel degenerative changes with anterior spurring and osteophyte. Upper chest: Negative. Other: Goiter with multiple calcified nodule. In the setting of significant comorbidities or limited life expectancy, no follow-up recommended (ref: J Am Coll Radiol. 2015 Feb;12(2): 143-50). IMPRESSION: 1. No acute intracranial pathology. Moderate age-related atrophy and chronic microvascular ischemic changes. 2. No acute fracture or subluxation of the cervical spine. Electronically Signed   By: Anner Crete M.D.   On: 01/19/2022 19:38   DG Chest Portable 1 View  Result Date: 01/19/2022 CLINICAL DATA:  Concern for fracture.  Fall. EXAM: PORTABLE CHEST 1 VIEW COMPARISON:  12/13/2020 FINDINGS: Postop aortic valve replacement. Heart size and vascularity normal. Lungs clear without infiltrate or effusion. Calcified fibroadenomas in the right breast. IMPRESSION: No active disease. Electronically Signed   By: Franchot Gallo M.D.   On: 01/19/2022 19:13   DG Abd Portable 1V  Result Date: 01/21/2022 CLINICAL DATA:  Constipation EXAM: PORTABLE ABDOMEN - 1 VIEW COMPARISON:  None. FINDINGS: Bowel gas pattern is unremarkable. There is moderate stool  burden primarily at the rectosigmoid. Cholecystectomy clips. Decreased osseous mineralization with degenerative changes of the spine and hips. IMPRESSION: Moderate stool burden primarily at the rectosigmoid. Electronically Signed   By: Macy Mis M.D.   On: 01/21/2022 17:25   DG Hips Bilat W or Wo Pelvis 5 Views  Result Date: 01/19/2022 CLINICAL DATA:  Concern for fracture, fall EXAM: DG HIP (WITH OR WITHOUT PELVIS) 5+V BILAT COMPARISON:  None. FINDINGS: SI joints are non widened. Pubic symphysis is intact. Old appearing fracture deformity involving the right superior pubic ramus. Suspect acute nondisplaced left superior and inferior pubic rami fractures. Both femoral heads are normally position. There are mild degenerative changes of the bilateral hips IMPRESSION: Suspect acute left superior and inferior pubic rami fractures Electronically Signed   By: Donavan Foil M.D.   On: 01/19/2022 19:25    Microbiology: Results for orders placed or performed during the hospital encounter of 01/19/22  Resp Panel by RT-PCR (Flu A&B, Covid) Nasopharyngeal Swab     Status: None   Collection Time: 01/19/22 11:52 PM   Specimen: Nasopharyngeal Swab; Nasopharyngeal(NP) swabs in vial transport medium  Result Value Ref Range Status   SARS Coronavirus 2 by RT PCR NEGATIVE NEGATIVE Final    Comment: (NOTE) SARS-CoV-2 target nucleic acids are NOT DETECTED.  The SARS-CoV-2 RNA is generally detectable in upper respiratory specimens during the acute phase of infection. The lowest concentration of SARS-CoV-2 viral copies this assay can  detect is 138 copies/mL. A negative result does not preclude SARS-Cov-2 infection and should not be used as the sole basis for treatment or other patient management decisions. A negative result may occur with  improper specimen collection/handling, submission of specimen other than nasopharyngeal swab, presence of viral mutation(s) within the areas targeted by this assay, and  inadequate number of viral copies(<138 copies/mL). A negative result must be combined with clinical observations, patient history, and epidemiological information. The expected result is Negative.  Fact Sheet for Patients:  EntrepreneurPulse.com.au  Fact Sheet for Healthcare Providers:  IncredibleEmployment.be  This test is no t yet approved or cleared by the [place] FDA and  has been authorized for detection and/or diagnosis of SARS-CoV-2 by FDA under an Emergency Use Authorization (EUA). This EUA will remain  in effect (meaning this test can be used) for the duration of the COVID-19 declaration under Section 564(b)(1) of the Act, 21 U.S.C.section 360bbb-3(b)(1), unless the authorization is terminated  or revoked sooner.       Influenza A by PCR NEGATIVE NEGATIVE Final   Influenza B by PCR NEGATIVE NEGATIVE Final    Comment: (NOTE) The Xpert Xpress SARS-CoV-2/FLU/RSV plus assay is intended as an aid in the diagnosis of influenza from Nasopharyngeal swab specimens and should not be used as a sole basis for treatment. Nasal washings and aspirates are unacceptable for Xpert Xpress SARS-CoV-2/FLU/RSV testing.  Fact Sheet for Patients: EntrepreneurPulse.com.au  Fact Sheet for Healthcare Providers: IncredibleEmployment.be  This test is not yet approved or cleared by the [place] FDA and has been authorized for detection and/or diagnosis of SARS-CoV-2 by FDA under an Emergency Use Authorization (EUA). This EUA will remain in effect (meaning this test can be used) for the duration of the COVID-19 declaration under Section 564(b)(1) of the Act, 21 U.S.C. section 360bbb-3(b)(1), unless the authorization is terminated or revoked.  Performed at [facility], [place] [address] [place], [place] [address]   Resp Panel by RT-PCR (Flu A&B, Covid) Nasopharyngeal Swab     Status: None    Collection Time: 01/21/22  3:26 PM   Specimen: Nasopharyngeal Swab; Nasopharyngeal(NP) swabs in vial transport medium  Result Value Ref Range Status   SARS Coronavirus 2 by RT PCR NEGATIVE NEGATIVE Final    Comment: (NOTE) SARS-CoV-2 target nucleic acids are NOT DETECTED.  The SARS-CoV-2 RNA is generally detectable in upper respiratory specimens during the acute phase of infection. The lowest concentration of SARS-CoV-2 viral copies this assay can detect is 138 copies/mL. A negative result does not preclude SARS-Cov-2 infection and should not be used as the sole basis for treatment or other patient management decisions. A negative result may occur with  improper specimen collection/handling, submission of specimen other than nasopharyngeal swab, presence of viral mutation(s) within the areas targeted by this assay, and inadequate number of viral copies(<138 copies/mL). A negative result must be combined with clinical observations, patient history, and epidemiological information. The expected result is Negative.  Fact Sheet for Patients:  EntrepreneurPulse.com.au  Fact Sheet for Healthcare Providers:  IncredibleEmployment.be  This test is no t yet approved or cleared by the [place] FDA and  has been authorized for detection and/or diagnosis of SARS-CoV-2 by FDA under an Emergency Use Authorization (EUA). This EUA will remain  in effect (meaning this test can be used) for the duration of the COVID-19 declaration under Section 564(b)(1) of the Act, 21 U.S.C.section 360bbb-3(b)(1), unless the authorization is terminated  or revoked sooner.  Influenza A by PCR NEGATIVE NEGATIVE Final   Influenza B by PCR NEGATIVE NEGATIVE Final    Comment: (NOTE) The Xpert Xpress SARS-CoV-2/FLU/RSV plus assay is intended as an aid in the diagnosis of influenza from Nasopharyngeal swab specimens and should not be used as a sole basis for treatment.  Nasal washings and aspirates are unacceptable for Xpert Xpress SARS-CoV-2/FLU/RSV testing.  Fact Sheet for Patients: EntrepreneurPulse.com.au  Fact Sheet for Healthcare Providers: IncredibleEmployment.be  This test is not yet approved or cleared by the [place] FDA and has been authorized for detection and/or diagnosis of SARS-CoV-2 by FDA under an Emergency Use Authorization (EUA). This EUA will remain in effect (meaning this test can be used) for the duration of the COVID-19 declaration under Section 564(b)(1) of the Act, 21 U.S.C. section 360bbb-3(b)(1), unless the authorization is terminated or revoked.  Performed at [facility], [place] [address] [place], [place] [address]     Labs: CBC: Recent Labs  Lab 01/19/22 1850 01/20/22 0307  WBC 15.3* 14.0*  NEUTROABS 11.8*  --   HGB 12.9 12.8  HCT 40.2 41.3  MCV 91.4 91.8  PLT 161 695   Basic Metabolic Panel: Recent Labs  Lab 01/19/22 1850  NA 141  K 4.1  CL 108  CO2 25  GLUCOSE 118*  BUN 24*  CREATININE 0.96  CALCIUM 8.5*   Liver Function Tests: No results for input(s): AST, ALT, ALKPHOS, BILITOT, PROT, ALBUMIN in the last 168 hours. CBG: Recent Labs  Lab 01/21/22 1242 01/21/22 1710 01/21/22 2157 01/22/22 0741  GLUCAP 261* 216* 227* 140*    Discharge time spent: greater than 30 minutes.  Signed: Berle Mull, MD [facility] 01/22/2022

## 2022-01-22 NOTE — Plan of Care (Signed)
Pt stable at this time. No needs at time of discharge to snf via ems.

## 2022-01-22 NOTE — Progress Notes (Signed)
Report called Tosha LPN at [facility] SNF prior to d/c via EMS. NO needs at time of d/c.

## 2022-01-22 NOTE — TOC Transition Note (Signed)
Transition of Care [facility]) - CM/SW Discharge Note   Patient Details  Name: Christine Newton MRN: [id number] Date of Birth: 05/05/1933  Transition of Care [facility]) CM/SW Contact:  Lennart Pall, LCSW Phone Number: 01/22/2022, 11:26 AM   Clinical Narrative:    Pt medically cleared for dc today to [facility].  Insurance auth received.  Pt and daughter aware/ agreeable. PTAR called at 11:20am.  RN to call report to [phone].  No further TOC needs.   Final next level of care: Skilled Nursing Facility Barriers to Discharge: Barriers Resolved   Patient Goals and CMS Choice Patient states their goals for this hospitalization and ongoing recovery are:: Go to short-term rehab and then return home with daughter CMS Medicare.gov Compare Post Acute Care list provided to:: Patient Represenative (must comment) Choice offered to / list presented to : Adult Children  Discharge Placement              Patient chooses bed at: [facility] Patient to be transferred to facility by: [place] Name of family member notified: daughter Patient and family notified of of transfer: 01/22/22  Discharge Plan and Services In-house Referral: Clinical Social Work   Post Acute Care Choice: [place]          DME Arranged: N/A DME Agency: NA                  Social Determinants of Health ([place]) Interventions     Readmission Risk Interventions No flowsheet data found.

## 2022-01-22 NOTE — Plan of Care (Signed)
°  Problem: Clinical Measurements: °Goal: Diagnostic test results will improve °Outcome: Progressing °  °Problem: Clinical Measurements: °Goal: Respiratory complications will improve °Outcome: Progressing °  °Problem: Coping: °Goal: Level of anxiety will decrease °Outcome: Progressing °  °Problem: Nutrition: °Goal: Adequate nutrition will be maintained °Outcome: Progressing °  °Problem: Skin Integrity: °Goal: Risk for impaired skin integrity will decrease °Outcome: Progressing °  °

## 2022-01-24 DIAGNOSIS — E119 Type 2 diabetes mellitus without complications: Secondary | ICD-10-CM | POA: Diagnosis not present

## 2022-01-24 DIAGNOSIS — S32599A Other specified fracture of unspecified pubis, initial encounter for closed fracture: Secondary | ICD-10-CM | POA: Diagnosis not present

## 2022-01-24 DIAGNOSIS — E785 Hyperlipidemia, unspecified: Secondary | ICD-10-CM | POA: Diagnosis not present

## 2022-01-24 DIAGNOSIS — K5900 Constipation, unspecified: Secondary | ICD-10-CM | POA: Diagnosis not present

## 2022-01-26 DIAGNOSIS — E119 Type 2 diabetes mellitus without complications: Secondary | ICD-10-CM | POA: Diagnosis not present

## 2022-01-26 DIAGNOSIS — K5900 Constipation, unspecified: Secondary | ICD-10-CM | POA: Diagnosis not present

## 2022-01-26 DIAGNOSIS — E785 Hyperlipidemia, unspecified: Secondary | ICD-10-CM | POA: Diagnosis not present

## 2022-01-26 DIAGNOSIS — S32599A Other specified fracture of unspecified pubis, initial encounter for closed fracture: Secondary | ICD-10-CM | POA: Diagnosis not present

## 2022-02-07 ENCOUNTER — Other Ambulatory Visit: Payer: Self-pay

## 2022-02-07 ENCOUNTER — Ambulatory Visit (INDEPENDENT_AMBULATORY_CARE_PROVIDER_SITE_OTHER): Payer: Medicare PPO

## 2022-02-07 ENCOUNTER — Ambulatory Visit (INDEPENDENT_AMBULATORY_CARE_PROVIDER_SITE_OTHER): Payer: Medicare PPO | Admitting: Physician Assistant

## 2022-02-07 ENCOUNTER — Telehealth: Payer: Self-pay | Admitting: Orthopaedic Surgery

## 2022-02-07 ENCOUNTER — Encounter: Payer: Self-pay | Admitting: Physician Assistant

## 2022-02-07 DIAGNOSIS — S3282XA Multiple fractures of pelvis without disruption of pelvic ring, initial encounter for closed fracture: Secondary | ICD-10-CM

## 2022-02-07 NOTE — Telephone Encounter (Signed)
Referral faxed to number given

## 2022-02-07 NOTE — Progress Notes (Signed)
HPI: [name] 88 year old female whose seen here today for follow-up of nondisplaced pubic rami fractures on the left.  She had an incident 01/19/2022 when she slipped in the bathroom when she thought to have gotten both legs and one of her pant legs and fell.  She has been in a skilled facility recently discharged to home.  She presents today with her daughter and caregiver.  Review of systems see HPI otherwise negative or noncontributory.  Physical exam: General well-developed well-nourished female no acute distress seated in wheelchair.  Left hip: Good range of motion left hip without pain.  Left calf supple nontender dorsiflexion plantarflexion left ankle intact.  Radiographs AP pelvis: Bilateral hips well located.  Hips are well preserved.  Nondisplaced left rami fractures was early consolidation.  No other fractures identified.  No other bony abnormalities.  Impression: Nondisplaced left pubic rami fractures  Plan: She is weightbearing as tolerated.  She is given a prescription for home physical therapy to work on gait balance transfers.  Follow-up with [name] in 6 weeks for repeat AP pelvis.  Sooner if there is any questions concerns.  She will call our office if she needs to have tramadol for pain relief however patient is family states she manages pretty well with Tylenol would rather stay on Tylenol if possible for pain.

## 2022-02-07 NOTE — Telephone Encounter (Signed)
Order placed in chart.

## 2022-02-07 NOTE — Telephone Encounter (Signed)
Patient was written a prescription for PT [facility] The prescription is not sufficient to complete referral for the order of PT. They need a standard physical therapy referral form faxed over at [phone].  They can also be reached by phone at [phone].

## 2022-02-08 ENCOUNTER — Telehealth: Payer: Self-pay

## 2022-02-08 NOTE — Telephone Encounter (Signed)
Verbally approved for PT for 2 times for 4 weeks, 2 times for 2 weeks, 1 time for 1 week, 1 time for 1 week. He states patient declined nursing care.

## 2022-02-15 DIAGNOSIS — Z79891 Long term (current) use of opiate analgesic: Secondary | ICD-10-CM | POA: Diagnosis not present

## 2022-02-15 DIAGNOSIS — I10 Essential (primary) hypertension: Secondary | ICD-10-CM | POA: Diagnosis not present

## 2022-02-15 DIAGNOSIS — E1159 Type 2 diabetes mellitus with other circulatory complications: Secondary | ICD-10-CM | POA: Diagnosis not present

## 2022-02-15 DIAGNOSIS — Z9181 History of falling: Secondary | ICD-10-CM

## 2022-02-15 DIAGNOSIS — S3282XD Multiple fractures of pelvis without disruption of pelvic ring, subsequent encounter for fracture with routine healing: Secondary | ICD-10-CM | POA: Diagnosis not present

## 2022-02-15 DIAGNOSIS — Z952 Presence of prosthetic heart valve: Secondary | ICD-10-CM | POA: Diagnosis not present

## 2022-02-15 DIAGNOSIS — K5900 Constipation, unspecified: Secondary | ICD-10-CM | POA: Diagnosis not present

## 2022-02-15 DIAGNOSIS — F0390 Unspecified dementia without behavioral disturbance: Secondary | ICD-10-CM | POA: Diagnosis not present

## 2022-02-15 DIAGNOSIS — W19XXXD Unspecified fall, subsequent encounter: Secondary | ICD-10-CM | POA: Diagnosis not present

## 2022-02-15 DIAGNOSIS — Z7984 Long term (current) use of oral hypoglycemic drugs: Secondary | ICD-10-CM

## 2022-02-15 DIAGNOSIS — Z7982 Long term (current) use of aspirin: Secondary | ICD-10-CM

## 2022-02-15 DIAGNOSIS — E785 Hyperlipidemia, unspecified: Secondary | ICD-10-CM | POA: Diagnosis not present

## 2022-02-18 ENCOUNTER — Ambulatory Visit: Payer: Medicare PPO | Admitting: Interventional Cardiology

## 2022-02-22 DIAGNOSIS — Z952 Presence of prosthetic heart valve: Secondary | ICD-10-CM | POA: Diagnosis not present

## 2022-02-22 DIAGNOSIS — Z7982 Long term (current) use of aspirin: Secondary | ICD-10-CM | POA: Diagnosis not present

## 2022-02-22 DIAGNOSIS — W19XXXD Unspecified fall, subsequent encounter: Secondary | ICD-10-CM | POA: Diagnosis not present

## 2022-02-22 DIAGNOSIS — I10 Essential (primary) hypertension: Secondary | ICD-10-CM | POA: Diagnosis not present

## 2022-02-22 DIAGNOSIS — K5900 Constipation, unspecified: Secondary | ICD-10-CM | POA: Diagnosis not present

## 2022-02-22 DIAGNOSIS — Z7984 Long term (current) use of oral hypoglycemic drugs: Secondary | ICD-10-CM

## 2022-02-22 DIAGNOSIS — E1159 Type 2 diabetes mellitus with other circulatory complications: Secondary | ICD-10-CM | POA: Diagnosis not present

## 2022-02-22 DIAGNOSIS — Z79891 Long term (current) use of opiate analgesic: Secondary | ICD-10-CM | POA: Diagnosis not present

## 2022-02-22 DIAGNOSIS — F0390 Unspecified dementia without behavioral disturbance: Secondary | ICD-10-CM | POA: Diagnosis not present

## 2022-02-22 DIAGNOSIS — E785 Hyperlipidemia, unspecified: Secondary | ICD-10-CM | POA: Diagnosis not present

## 2022-02-22 DIAGNOSIS — Z9181 History of falling: Secondary | ICD-10-CM

## 2022-02-28 NOTE — Progress Notes (Signed)
Subjective:  Patient ID: Christine Newton, female    DOB: June 20, 1933  Age: 88 y.o. MRN: [id number]  Chief Complaint  Patient presents with   Diabetes   Hyperlipidemia    HPI: chronic visit  Diabetes: Patient is taking metformin 500 mg daily. Last A1C 6.7% Blood sugar average is 87-116 mg/dl fasting. She denied any hypoglycemia.  Hyperlipidemia:  She takes Atorvastatin 20 mg daily. Patient presents with hyperlipidemia.  Compliance with treatment has been good; patient takes medicines as directed, maintains low cholesterol diet, follows up as directed, and maintains exercise regimen.  Patient is using atorvastatin without problems.   Hypertension: She is taking Lisinopril 20 mg daily, Aspirin 81 mg daily. Patient check blood pressure at home systolic 119-160 and diastolic 64- 104.  Adenoma stable- seen at [facility]  Current Outpatient Medications on File Prior to Visit  Medication Sig Dispense Refill   ACCU-CHEK GUIDE test strip 1 EACH BY OTHER ROUTE 2 (TWO) TIMES DAILY AS NEEDED. USE AS INSTRUCTED 200 strip 2   Accu-Chek Softclix Lancets lancets 1 EACH BY DOES NOT APPLY ROUTE 3 (THREE) TIMES DAILY. 100 each 2   acetaminophen (TYLENOL) 500 MG tablet Take 1 tablet (500 mg total) by mouth every 4 (four) hours as needed. 30 tablet 0   aspirin EC 81 MG tablet Take 1 tablet (81 mg total) by mouth daily. Swallow whole. 30 tablet 11   atorvastatin (LIPITOR) 20 MG tablet Take 1 tablet (20 mg total) by mouth daily. 90 tablet 3   Blood Glucose Monitoring Suppl (ACCU-CHEK GUIDE) w/Device KIT 1 each by Does not apply route 2 (two) times daily as needed. 1 kit 0   Lancets Misc. (ACCU-CHEK SOFTCLIX LANCET DEV) KIT 1 each by Does not apply route 3 (three) times daily. 1 kit 0   lidocaine (LIDODERM) 5 % Place 1 patch onto the skin daily. Remove & Discard patch within 12 hours or as directed by MD 30 patch 0   lisinopril (ZESTRIL) 20 MG tablet TAKE 1 TABLET BY MOUTH EVERY DAY (Patient taking  differently: Take 10 mg by mouth daily. Depending her blood pressure.) 90 tablet 2   metFORMIN (GLUCOPHAGE) 500 MG tablet Take 500 mg by mouth daily with breakfast.     polyethylene glycol (MIRALAX / GLYCOLAX) 17 g packet Take 17 g by mouth daily. 14 each 0   traMADol (ULTRAM) 50 MG tablet Take 1 tablet (50 mg total) by mouth every 12 (twelve) hours as needed. 10 tablet 0   No current facility-administered medications on file prior to visit.   Past Medical History:  Diagnosis Date   Diabetes (HCC)    Hypertension    Nonrheumatic aortic (valve) stenosis 09/21/2020   Past Surgical History:  Procedure Laterality Date   CARDIAC VALVE REPLACEMENT     CHOLECYSTECTOMY      History reviewed. No pertinent family history. Social History   Socioeconomic History   Marital status: Widowed    Spouse name: Not on file   Number of children: Not on file   Years of education: Not on file   Highest education level: Not on file  Occupational History   Not on file  Tobacco Use   Smoking status: Never   Smokeless tobacco: Never  Substance and Sexual Activity   Alcohol use: Never   Drug use: Never   Sexual activity: Not Currently  Other Topics Concern   Not on file  Social History Narrative   Not on file   Social Determinants of  Health   Financial Resource Strain: Not on file  Food Insecurity: Not on file  Transportation Needs: Not on file  Physical Activity: Not on file  Stress: Not on file  Social Connections: Not on file    Review of Systems  Constitutional:  Negative for chills, fatigue and fever.  HENT:  Negative for congestion, ear pain and sore throat.   Eyes:  Negative for visual disturbance.  Respiratory:  Negative for cough and shortness of breath.   Cardiovascular:  Negative for chest pain and palpitations.  Gastrointestinal:  Negative for abdominal pain, constipation, diarrhea, nausea and vomiting.  Endocrine: Negative for polydipsia, polyphagia and polyuria.   Genitourinary:  Negative for difficulty urinating and dysuria.  Musculoskeletal:  Negative for arthralgias, back pain and myalgias.  Skin:  Negative for rash.  Neurological:  Negative for headaches.  Psychiatric/Behavioral:  Negative for dysphoric mood. The patient is not nervous/anxious.     Objective:  BP 120/80   Pulse 84   Temp 97.7 F (36.5 C)   Resp 15   Ht 5\' 4"  (1.626 m)   Wt 146 lb (66.2 kg)   SpO2 98%   BMI 25.06 kg/m   BP/Weight 03/01/2022 01/22/2022 01/20/2022  Systolic BP 120 141 -  Diastolic BP 80 79 -  Wt. (Lbs) 146 - 140.21  BMI 25.06 - 22.63    Physical Exam Vitals reviewed.  Constitutional:      General: She is not in acute distress.    Appearance: Normal appearance.  HENT:     Head: Normocephalic.     Left Ear: Tympanic membrane normal.     Nose: Nose normal.     Mouth/Throat:     Pharynx: Oropharynx is clear.  Eyes:     Extraocular Movements: Extraocular movements intact.     Conjunctiva/sclera: Conjunctivae normal.     Pupils: Pupils are equal, round, and reactive to light.  Cardiovascular:     Rate and Rhythm: Normal rate and regular rhythm.     Pulses: Normal pulses.     Heart sounds: No murmur heard.   No gallop.  Pulmonary:     Effort: Pulmonary effort is normal. No respiratory distress.     Breath sounds: Normal breath sounds. No wheezing.  Abdominal:     General: Abdomen is flat. Bowel sounds are normal. There is no distension.     Tenderness: There is no abdominal tenderness.  Musculoskeletal:        General: Normal range of motion.     Cervical back: Normal range of motion.     Right lower leg: No edema.     Left lower leg: No edema.     Comments: Pain left pubic ramus from fracture  Skin:    General: Skin is warm.     Capillary Refill: Capillary refill takes less than 2 seconds.  Neurological:     General: No focal deficit present.     Mental Status: She is alert and oriented to person, place, and time. Mental status is at  baseline.    Diabetic Foot Exam - Simple   No data filed      Lab Results  Component Value Date   WBC 14.0 (H) 01/20/2022   HGB 12.8 01/20/2022   HCT 41.3 01/20/2022   PLT 167 01/20/2022   GLUCOSE 118 (H) 01/19/2022   CHOL 187 11/01/2021   TRIG 100 11/01/2021   HDL 47 11/01/2021   LDLCALC 122 (H) 11/01/2021   ALT 17 11/01/2021  AST 13 11/01/2021   NA 141 01/19/2022   K 4.1 01/19/2022   CL 108 01/19/2022   CREATININE 0.96 01/19/2022   BUN 24 (H) 01/19/2022   CO2 25 01/19/2022   TSH 1.010 11/01/2021   INR 1.0 12/13/2020   HGBA1C 6.7 (H) 11/01/2021      Assessment & Plan:   Problem List Items Addressed This Visit       Cardiovascular and Mediastinum   Essential hypertension - Primary   Relevant Orders   Comprehensive metabolic panel   CBC with Differential/Platelet An individual hypertension care plan was established and reinforced today.  The patient's status was assessed using clinical findings on exam and labs or diagnostic tests. The patient's success at meeting treatment goals on disease specific evidence-based guidelines and found to be well controlled. SELF MANAGEMENT: The patient and I together assessed ways to personally work towards obtaining the recommended goals. RECOMMENDATIONS: avoid decongestants found in common cold remedies, decrease consumption of alcohol, perform routine monitoring of BP with home BP cuff, exercise, reduction of dietary salt, take medicines as prescribed, try not to miss doses and quit smoking.  Regular exercise and maintaining a healthy weight is needed.  Stress reduction may help. A CLINICAL SUMMARY including written plan identify barriers to care unique to individual due to social or financial issues.  We attempt to mutually creat solutions for individual and family understanding.    Obesity, diabetes, and hypertension syndrome (HCC)   Relevant Orders   Hemoglobin A1c   Microalbumin / creatinine urine ratio An individual care  plan for diabetes was established and reinforced today.  The patient's status was assessed using clinical findings on exam, labs and diagnostic testing. Patient success at meeting goals based on disease specific evidence-based guidelines and found to be good controlled. Medications were assessed and patient's understanding of the medical issues , including barriers were assessed. Recommend adherence to a diabetic diet, a graduated exercise program, HgbA1c level is checked quarterly, and urine microalbumin performed yearly .  Annual mono-filament sensation testing performed. Lower blood pressure and control hyperlipidemia is important. Get annual eye exams and annual flu shots and smoking cessation discussed.  Self management goals were discussed.      Endocrine   Adenoma of pituitary (HCC) No changes, seen at [facility]      Nervous and Auditory   Dementia (HCC) Dementia is stable and patient remains functional with ADLs     Other   Hyperlipidemia   Relevant Orders   Lipid panel AN INDIVIDUAL CARE PLAN for hyperlipidemia/ cholesterol was established and reinforced today.  The patient's status was assessed using clinical findings on exam, lab and other diagnostic tests. The patient's disease status was assessed based on evidence-based guidelines and found to be fair controlled. MEDICATIONS were reviewed. SELF MANAGEMENT GOALS have been discussed and patient's success at attaining the goal of low cholesterol was assessed. RECOMMENDATION given include regular exercise 3 days a week and low cholesterol/low fat diet. CLINICAL SUMMARY including written plan to identify barriers unique to the patient due to social or economic  reasons was discussed.     Seizure-like activity (HCC) No recurrence of this seizure like activity on no seizure medicines    BMI 25.0-25.9,adult Continue present diet and activity levels   Other Visit Diagnoses     Dysuria       Relevant Orders   POCT URINALYSIS DIP  (CLINITEK) (Completed) Urinalysis normal    Pathological fracture, pelvis, initial encounter for fracture  Relevant Orders   DG Bone Density   VITAMIN D 25 Hydroxy (Vit-D Deficiency, Fractures) Patient needs DEXA with new fracture     . Extended visit > 30 minutes with discussion of fracture care   Orders Placed This Encounter  Procedures   DG Bone Density   Comprehensive metabolic panel   Hemoglobin A1c   Lipid panel   Microalbumin / creatinine urine ratio   CBC with Differential/Platelet   VITAMIN D 25 Hydroxy (Vit-D Deficiency, Fractures)   POCT URINALYSIS DIP (CLINITEK)     Follow-up: Return in about 4 months (around 07/01/2022).  An After Visit Summary was printed and given to the patient.  Brent Bulla, MD [name] [phone]

## 2022-03-01 ENCOUNTER — Other Ambulatory Visit: Payer: Self-pay

## 2022-03-01 ENCOUNTER — Ambulatory Visit: Payer: Medicare PPO | Admitting: Legal Medicine

## 2022-03-01 ENCOUNTER — Encounter: Payer: Self-pay | Admitting: Legal Medicine

## 2022-03-01 VITALS — BP 120/80 | HR 84 | Temp 97.7°F | Resp 15 | Ht 64.0 in | Wt 146.0 lb

## 2022-03-01 DIAGNOSIS — R569 Unspecified convulsions: Secondary | ICD-10-CM | POA: Diagnosis not present

## 2022-03-01 DIAGNOSIS — E782 Mixed hyperlipidemia: Secondary | ICD-10-CM | POA: Diagnosis not present

## 2022-03-01 DIAGNOSIS — F03B0 Unspecified dementia, moderate, without behavioral disturbance, psychotic disturbance, mood disturbance, and anxiety: Secondary | ICD-10-CM

## 2022-03-01 DIAGNOSIS — Z6825 Body mass index (BMI) 25.0-25.9, adult: Secondary | ICD-10-CM

## 2022-03-01 DIAGNOSIS — I10 Essential (primary) hypertension: Secondary | ICD-10-CM

## 2022-03-01 DIAGNOSIS — D352 Benign neoplasm of pituitary gland: Secondary | ICD-10-CM | POA: Diagnosis not present

## 2022-03-01 DIAGNOSIS — E669 Obesity, unspecified: Secondary | ICD-10-CM

## 2022-03-01 DIAGNOSIS — E1159 Type 2 diabetes mellitus with other circulatory complications: Secondary | ICD-10-CM | POA: Diagnosis not present

## 2022-03-01 DIAGNOSIS — I152 Hypertension secondary to endocrine disorders: Secondary | ICD-10-CM | POA: Diagnosis not present

## 2022-03-01 DIAGNOSIS — E1169 Type 2 diabetes mellitus with other specified complication: Secondary | ICD-10-CM

## 2022-03-01 DIAGNOSIS — M84454A Pathological fracture, pelvis, initial encounter for fracture: Secondary | ICD-10-CM | POA: Diagnosis not present

## 2022-03-01 DIAGNOSIS — R300 Dysuria: Secondary | ICD-10-CM | POA: Diagnosis not present

## 2022-03-01 LAB — POCT URINALYSIS DIP (CLINITEK)
Bilirubin, UA: NEGATIVE
Blood, UA: NEGATIVE
Glucose, UA: NEGATIVE mg/dL
Ketones, POC UA: NEGATIVE mg/dL
Leukocytes, UA: NEGATIVE
Nitrite, UA: NEGATIVE
POC PROTEIN,UA: 30 — AB
Spec Grav, UA: 1.02 (ref 1.010–1.025)
Urobilinogen, UA: 1 E.U./dL
pH, UA: 6 (ref 5.0–8.0)

## 2022-03-02 LAB — LIPID PANEL
Chol/HDL Ratio: 3.9 ratio (ref 0.0–4.4)
Cholesterol, Total: 174 mg/dL (ref 100–199)
HDL: 45 mg/dL (ref >39)
LDL Chol Calc (NIH): 113 mg/dL — ABNORMAL HIGH (ref 0–99)
Triglycerides: 83 mg/dL (ref 0–149)
VLDL Cholesterol Cal: 16 mg/dL (ref 5–40)

## 2022-03-02 LAB — MICROALBUMIN / CREATININE URINE RATIO
Creatinine, Urine: 100.9 mg/dL
Microalb/Creat Ratio: 17 mg/g creat (ref 0–29)
Microalbumin, Urine: 17.1 ug/mL

## 2022-03-02 LAB — COMPREHENSIVE METABOLIC PANEL
ALT: 7 IU/L (ref 0–32)
AST: 9 IU/L (ref 0–40)
Albumin/Globulin Ratio: 1.4 (ref 1.2–2.2)
Albumin: 3.7 g/dL (ref 3.6–4.6)
Alkaline Phosphatase: 116 IU/L (ref 44–121)
BUN/Creatinine Ratio: 22 (ref 12–28)
BUN: 19 mg/dL (ref 8–27)
Bilirubin Total: 0.6 mg/dL (ref 0.0–1.2)
CO2: 23 mmol/L (ref 20–29)
Calcium: 8.8 mg/dL (ref 8.7–10.3)
Chloride: 107 mmol/L — ABNORMAL HIGH (ref 96–106)
Creatinine, Ser: 0.87 mg/dL (ref 0.57–1.00)
Globulin, Total: 2.6 g/dL (ref 1.5–4.5)
Glucose: 109 mg/dL — ABNORMAL HIGH (ref 70–99)
Potassium: 3.9 mmol/L (ref 3.5–5.2)
Sodium: 146 mmol/L — ABNORMAL HIGH (ref 134–144)
Total Protein: 6.3 g/dL (ref 6.0–8.5)
eGFR: 65 mL/min/{1.73_m2} (ref >59)

## 2022-03-02 LAB — CBC WITH DIFFERENTIAL/PLATELET
Basophils Absolute: 0.1 10*3/uL (ref 0.0–0.2)
Basos: 1 %
EOS (ABSOLUTE): 0.2 10*3/uL (ref 0.0–0.4)
Eos: 2 %
Hematocrit: 40.3 % (ref 34.0–46.6)
Hemoglobin: 12.9 g/dL (ref 11.1–15.9)
Immature Grans (Abs): 0.2 10*3/uL — ABNORMAL HIGH (ref 0.0–0.1)
Immature Granulocytes: 3 %
Lymphocytes Absolute: 2.1 10*3/uL (ref 0.7–3.1)
Lymphs: 23 %
MCH: 28.9 pg (ref 26.6–33.0)
MCHC: 32 g/dL (ref 31.5–35.7)
MCV: 90 fL (ref 79–97)
Monocytes Absolute: 0.6 10*3/uL (ref 0.1–0.9)
Monocytes: 6 %
Neutrophils Absolute: 5.8 10*3/uL (ref 1.4–7.0)
Neutrophils: 65 %
Platelets: 169 10*3/uL (ref 150–450)
RBC: 4.47 x10E6/uL (ref 3.77–5.28)
RDW: 14.5 % (ref 11.7–15.4)
WBC: 9 10*3/uL (ref 3.4–10.8)

## 2022-03-02 LAB — CARDIOVASCULAR RISK ASSESSMENT

## 2022-03-02 LAB — HEMOGLOBIN A1C
Est. average glucose Bld gHb Est-mCnc: 154 mg/dL
Hgb A1c MFr Bld: 7 % — ABNORMAL HIGH (ref 4.8–5.6)

## 2022-03-02 LAB — VITAMIN D 25 HYDROXY (VIT D DEFICIENCY, FRACTURES): Vit D, 25-Hydroxy: 37.5 ng/mL (ref 30.0–100.0)

## 2022-03-02 NOTE — Progress Notes (Signed)
Vitamin D 37.5 low normal ?lp

## 2022-03-03 ENCOUNTER — Other Ambulatory Visit: Payer: Self-pay

## 2022-03-03 MED ORDER — ATORVASTATIN CALCIUM 40 MG PO TABS
40.0000 mg | ORAL_TABLET | Freq: Every day | ORAL | 1 refills | Status: DC
Start: 2022-03-03 — End: 2022-12-02

## 2022-03-03 NOTE — Progress Notes (Signed)
Glucose 109, kidney tests ok, liver tests normal, A1c 7.0 good, Cholesterol LDL cholesterol 113 high, increase atorvastatin to 40 mg, CBC normal, Microalbuminuria normal ?lp

## 2022-03-07 ENCOUNTER — Encounter: Payer: Self-pay | Admitting: Legal Medicine

## 2022-03-21 ENCOUNTER — Other Ambulatory Visit: Payer: Self-pay

## 2022-03-21 ENCOUNTER — Ambulatory Visit (INDEPENDENT_AMBULATORY_CARE_PROVIDER_SITE_OTHER): Payer: Medicare PPO

## 2022-03-21 ENCOUNTER — Encounter: Payer: Self-pay | Admitting: Physician Assistant

## 2022-03-21 ENCOUNTER — Ambulatory Visit (INDEPENDENT_AMBULATORY_CARE_PROVIDER_SITE_OTHER): Payer: Medicare PPO | Admitting: Physician Assistant

## 2022-03-21 DIAGNOSIS — S3282XA Multiple fractures of pelvis without disruption of pelvic ring, initial encounter for closed fracture: Secondary | ICD-10-CM

## 2022-03-21 NOTE — Progress Notes (Signed)
HPI: Christine Newton returns today for follow-up of her left nondisplaced pubic rami fracture which she sustained on 01/19/2022.  She states overall she is doing well she is walking with a walker.  She has no pain with ambulation or sitting.  She is still walking with a walker. ? ?Review of systems see HPI otherwise negative ? ?Physical exam: Left hip full range of motion without pain.  Ambulates with a rolling walker. ? ?Radiographs AP pelvis: Shows further consolidation of the ninth displaced superior and inferior rami fractures.  No change in overall position alignment.  Bilateral hips well located.  No acute fractures or bony abnormalities. ? ?Impression: Left nondisplaced pubic rami fractures ? ?Plan: She is activity as tolerated.  Follow-up as needed.  Questions were encouraged and answered at length ?

## 2022-03-24 ENCOUNTER — Ambulatory Visit: Payer: Medicare PPO

## 2022-03-28 ENCOUNTER — Telehealth: Payer: Self-pay

## 2022-03-28 NOTE — Telephone Encounter (Signed)
fine

## 2022-03-28 NOTE — Telephone Encounter (Signed)
Patient daughter called and stated patient started Lipitor 40 mg and her blood pressure and sugars have been elevated. She went back down to 20 mg on medication and her vitals are back to normal, wanted to know if its ok to just give her 20 mg of Lipitor. Please advise. ?

## 2022-03-29 NOTE — Telephone Encounter (Signed)
I left detailed message on voicemail. ?

## 2022-06-06 ENCOUNTER — Other Ambulatory Visit: Payer: Self-pay

## 2022-06-06 DIAGNOSIS — E1122 Type 2 diabetes mellitus with diabetic chronic kidney disease: Secondary | ICD-10-CM

## 2022-06-06 MED ORDER — ACCU-CHEK GUIDE VI STRP: ORAL_STRIP | 2 refills | Status: DC

## 2022-07-08 ENCOUNTER — Telehealth: Payer: Self-pay | Admitting: Interventional Cardiology

## 2022-07-08 NOTE — Telephone Encounter (Signed)
Left message for patient, or daughter Loletha Carrow, to return call.

## 2022-07-08 NOTE — Telephone Encounter (Signed)
  Pt c/o BP issue: STAT if pt c/o blurred vision, one-sided weakness or slurred speech  1. What are your last 5 BP readings? 683 - highest systolic, 72-902 for diastolic   2. Are you having any other symptoms (ex. Dizziness, headache, blurred vision, passed out)? None   3. What is your BP issue? Pt's daughter said, they are concerned of pt's BP. She said, they were told by Dr. Tamala Julian to call if pt's bottom number is more than 90. Pt's lower number for her BP been at 90 to 100. They made an appt with Christen Bame on 07/19 at 2:20 pm

## 2022-07-11 ENCOUNTER — Other Ambulatory Visit: Payer: Self-pay

## 2022-07-11 MED ORDER — METFORMIN HCL 500 MG PO TABS: 500.0000 mg | ORAL_TABLET | Freq: Every day | ORAL | 2 refills | Status: DC

## 2022-07-12 ENCOUNTER — Ambulatory Visit (INDEPENDENT_AMBULATORY_CARE_PROVIDER_SITE_OTHER): Payer: Medicare PPO | Admitting: Legal Medicine

## 2022-07-12 ENCOUNTER — Encounter: Payer: Self-pay | Admitting: Legal Medicine

## 2022-07-12 VITALS — BP 130/82 | HR 95 | Temp 98.3°F | Resp 15 | Ht 64.0 in | Wt 147.0 lb

## 2022-07-12 DIAGNOSIS — F03B0 Unspecified dementia, moderate, without behavioral disturbance, psychotic disturbance, mood disturbance, and anxiety: Secondary | ICD-10-CM | POA: Diagnosis not present

## 2022-07-12 DIAGNOSIS — E669 Obesity, unspecified: Secondary | ICD-10-CM

## 2022-07-12 DIAGNOSIS — E1159 Type 2 diabetes mellitus with other circulatory complications: Secondary | ICD-10-CM

## 2022-07-12 DIAGNOSIS — Z6825 Body mass index (BMI) 25.0-25.9, adult: Secondary | ICD-10-CM

## 2022-07-12 DIAGNOSIS — I152 Hypertension secondary to endocrine disorders: Secondary | ICD-10-CM | POA: Diagnosis not present

## 2022-07-12 DIAGNOSIS — I10 Essential (primary) hypertension: Secondary | ICD-10-CM | POA: Diagnosis not present

## 2022-07-12 DIAGNOSIS — D352 Benign neoplasm of pituitary gland: Secondary | ICD-10-CM | POA: Diagnosis not present

## 2022-07-12 DIAGNOSIS — E1169 Type 2 diabetes mellitus with other specified complication: Secondary | ICD-10-CM | POA: Diagnosis not present

## 2022-07-12 DIAGNOSIS — Z85038 Personal history of other malignant neoplasm of large intestine: Secondary | ICD-10-CM | POA: Diagnosis not present

## 2022-07-12 DIAGNOSIS — E782 Mixed hyperlipidemia: Secondary | ICD-10-CM | POA: Diagnosis not present

## 2022-07-12 MED ORDER — LISINOPRIL-HYDROCHLOROTHIAZIDE 20-25 MG PO TABS: 1.0000 | ORAL_TABLET | Freq: Every day | ORAL | 3 refills | Status: DC

## 2022-07-12 NOTE — Progress Notes (Signed)
Subjective:  Patient ID: Christine Newton, female    DOB: [birth year]  Age: 88 y.o. MRN: [id number]  Chief Complaint  Patient presents with   Memory Loss   Hypertension    HPI: chronic  Patient's daughter is concerned about the blood pressure of patient because diastolic has been high lately around 80-100. She is taking lisinopril 20 mg daily.   Patient also has some changes on her memory and she would like to evaluate the memory again. Phq 25/30.  Dependent for ADLS, can walk with walker, IADL totally dependent. Family takes care of her.  No emotional lability.  Incontinent.  Current Outpatient Medications on File Prior to Visit  Medication Sig Dispense Refill   Accu-Chek Softclix Lancets lancets 1 EACH BY DOES NOT APPLY ROUTE 3 (THREE) TIMES DAILY. 100 each 2   aspirin EC 81 MG tablet Take 1 tablet (81 mg total) by mouth daily. Swallow whole. 30 tablet 11   atorvastatin (LIPITOR) 40 MG tablet Take 1 tablet (40 mg total) by mouth daily. 90 tablet 1   Blood Glucose Monitoring Suppl (ACCU-CHEK GUIDE) w/Device KIT 1 each by Does not apply route 2 (two) times daily as needed. 1 kit 0   glucose blood (ACCU-CHEK GUIDE) test strip 1 EACH BY OTHER ROUTE 2 (TWO) TIMES DAILY AS NEEDED. USE AS INSTRUCTED 200 strip 2   Lancets Misc. (ACCU-CHEK SOFTCLIX LANCET DEV) KIT 1 each by Does not apply route 3 (three) times daily. 1 kit 0   metFORMIN (GLUCOPHAGE) 500 MG tablet Take 1 tablet (500 mg total) by mouth daily with breakfast. Take 2 every morning and 1 every evening 90 tablet 2   polyethylene glycol (MIRALAX / GLYCOLAX) 17 g packet Take 17 g by mouth daily. 14 each 0   No current facility-administered medications on file prior to visit.   Past Medical History:  Diagnosis Date   Diabetes ([place])    Hypertension    Nonrheumatic aortic (valve) stenosis 09/21/2020   Past Surgical History:  Procedure Laterality Date   CARDIAC VALVE REPLACEMENT     CHOLECYSTECTOMY      History reviewed. No  pertinent family history. Social History   Socioeconomic History   Marital status: Widowed    Spouse name: Not on file   Number of children: Not on file   Years of education: Not on file   Highest education level: Not on file  Occupational History   Not on file  Tobacco Use   Smoking status: Never   Smokeless tobacco: Never  Substance and Sexual Activity   Alcohol use: Never   Drug use: Never   Sexual activity: Not Currently  Other Topics Concern   Not on file  Social History Narrative   Not on file   Social Determinants of Health   Financial Resource Strain: Not on file  Food Insecurity: Not on file  Transportation Needs: Not on file  Physical Activity: Not on file  Stress: Not on file  Social Connections: Not on file    Review of Systems  Constitutional:  Negative for chills, fatigue and fever.  HENT:  Negative for congestion, ear pain and sore throat.   Eyes:  Negative for visual disturbance.  Respiratory:  Negative for cough and shortness of breath.   Cardiovascular:  Negative for chest pain and palpitations.  Gastrointestinal:  Negative for abdominal pain, constipation, diarrhea, nausea and vomiting.  Endocrine: Negative for polydipsia, polyphagia and polyuria.  Genitourinary:  Negative for difficulty urinating and dysuria.  Musculoskeletal:  Negative for arthralgias, back pain and myalgias.  Skin:  Negative for rash.  Neurological:  Negative for headaches.  Psychiatric/Behavioral:  Negative for dysphoric mood. The patient is not nervous/anxious.      Objective:  BP 130/82   Pulse 95   Temp 98.3 F (36.8 C)   Resp 15   Ht '5\' 4"'  (1.626 m)   Wt 147 lb (66.7 kg)   SpO2 96%   BMI 25.23 kg/m      07/12/2022    2:35 PM 03/01/2022    9:55 AM 01/22/2022    6:43 AM  BP/Weight  Systolic BP 941 740 814  Diastolic BP 82 80 79  Wt. (Lbs) 147 146   BMI 25.23 kg/m2 25.06 kg/m2     Physical Exam  Diabetic Foot Exam - Simple   No data filed      Lab  Results  Component Value Date   WBC 9.0 03/01/2022   HGB 12.9 03/01/2022   HCT 40.3 03/01/2022   PLT 169 03/01/2022   GLUCOSE 109 (H) 03/01/2022   CHOL 174 03/01/2022   TRIG 83 03/01/2022   HDL 45 03/01/2022   LDLCALC 113 (H) 03/01/2022   ALT 7 03/01/2022   AST 9 03/01/2022   NA 146 (H) 03/01/2022   K 3.9 03/01/2022   CL 107 (H) 03/01/2022   CREATININE 0.87 03/01/2022   BUN 19 03/01/2022   CO2 23 03/01/2022   TSH 1.010 11/01/2021   INR 1.0 12/13/2020   HGBA1C 7.0 (H) 03/01/2022      Assessment & Plan:   Problem List Items Addressed This Visit       Cardiovascular and Mediastinum   Essential hypertension - Primary   Relevant Medications   lisinopril-hydrochlorothiazide (ZESTORETIC) 20-25 MG tablet   Other Relevant Orders   CBC with Differential/Platelet   Comprehensive metabolic panel Blood pressure at home is still somewhat homemade diastolically 48-18 systolic remains around 563.  I will change her lisinopril 20 mg to lisinopril with hydrochlorothiazide 25 mg.  The caregiver will continue taking blood pressure every day for the next week and give [name] a call with these readings the end of the week to see if we need to see your or do further work.   Obesity, diabetes, and hypertension syndrome (HCC)   Relevant Medications   lisinopril-hydrochlorothiazide (ZESTORETIC) 20-25 MG tablet   Other Relevant Orders   Hemoglobin A1c An individual care plan for diabetes was established and reinforced today.  The patient's status was assessed using clinical findings on exam, labs and diagnostic testing. Patient success at meeting goals based on disease specific evidence-based guidelines and found to be good controlled. Medications were assessed and patient's understanding of the medical issues , including barriers were assessed. Recommend adherence to a diabetic diet, a graduated exercise program, HgbA1c level is checked quarterly, and urine microalbumin performed yearly .  Annual  mono-filament sensation testing performed. Lower blood pressure and control hyperlipidemia is important. Get annual eye exams and annual flu shots and smoking cessation discussed.  Self management goals were discussed.      Endocrine   Adenoma of pituitary ([place]) Found on ct, no neurologic symptoms, [facility]     Nervous and Auditory   Dementia [facility]) Patient continues to have mild to moderate dementia Mini-Mental today was 25 out of 30 worse in the past and was 15 out of 30 she must be having a very good day she is answering questions well but is fully  dependent for her ADLs and IADLs     Other   History of colon cancer Past history of colon cancer with no recurrence.   Hyperlipidemia   Relevant Medications   lisinopril-hydrochlorothiazide (ZESTORETIC) 20-25 MG tablet   Other Relevant Orders   Lipid panel AN INDIVIDUAL CARE PLAN for hyperlipidemia/ cholesterol was established and reinforced today.  The patient's status was assessed using clinical findings on exam, lab and other diagnostic tests. The patient's disease status was assessed based on evidence-based guidelines and found to be fair controlled. MEDICATIONS were reviewed. SELF MANAGEMENT GOALS have been discussed and patient's success at attaining the goal of low cholesterol was assessed. RECOMMENDATION given include regular exercise 3 days a week and low cholesterol/low fat diet. CLINICAL SUMMARY including written plan to identify barriers unique to the patient due to social or economic  reasons was discussed.    BMI 25.0-25.9,adult Continue diet  .  Meds ordered this encounter  Medications   lisinopril-hydrochlorothiazide (ZESTORETIC) 20-25 MG tablet    Sig: Take 1 tablet by mouth daily.    Dispense:  90 tablet    Refill:  3  A total of 30 minutes were spent face-to-face with the patient during this encounter and over half of that time was spent on counseling and coordination of care.    Orders Placed This  Encounter  Procedures   CBC with Differential/Platelet   Comprehensive metabolic panel   Hemoglobin A1c   Lipid panel     Follow-up: Return in about 4 years (around 07/12/2026).  An After Visit Summary was printed and given to the patient.  Reinaldo Meeker, MD [name] [phone]

## 2022-07-12 NOTE — Progress Notes (Unsigned)
Cardiology Office Note:    Date:  07/13/2022   ID:  Christine Newton 01/17/1933, MRN [id number]  PCP:  Lillard Anes, [place] [facility] Cardiologist:  Sinclair Grooms, MD     Referring MD: Lillard Anes,*   Chief Complaint: elevated BP  History of Present Illness:    Christine Newton is a very pleasant 88 y.o. female with a hx of nonrheumatic aortic stenosis s/p aortic valve replacement with #21 CE biologic with septal myectomy 2014, hypertension, hyperlipidemia, diabetes.   She established care with our group 11/18/2020 at which time she reported shortness of breath. Echocardiogram revealed normal LVEF, valve leaflets a little stiffer than previous echo in 2019 at [facility], no concerns per Dr. Tamala Julian  She was last seen in our office on 02/19/2021 by Dr. Tamala Julian at which time she had had a recent hospital stay for an episode of syncope. Blood pressures were running relatively low, presumed to be related to hypotension. Therefore amlodipine was discontinued.  ACE inhibitor therapy was continued.  BP readings at home reported to be 120-145 mmHg at the time of the office visit.  Felt to have a TIA during that admission so difficult to discern if it was related to low BP or a true TIA.  Aspirin and Plavix was started but Plavix had to be discontinued because of rectal bleeding after discharged.  She was also placed on Keppra which she could not tolerate.  Imaging demonstrated severe small vessel CNS disease.  Work-up included an EEG which raises the question of underlying seizure activity.  She was feeling well at the time of the visit and no changes were made to her treatment regimen. Was advised to follow-up in 1 year  Her daughter contacted our office on 07/08/2022 with concerns for elevated blood pressure of SBP 150s, 90-100 DBP. Office visit was scheduled.  Today, she is here for evaluation of elevated BP. Much of her history is provided by her daughter,  Christine Newton. [name] reports elevated diastolic blood pressure > 90 mmHg for about 2 weeks.  Additionally she gets SOB with activity. No edema, orthopnea, PND. Admits that she does a lot of sitting. [name] reports that her breathing sounds short when she is sleeping, no apnea, mild snoring on occasion. Limits sodium intake with an occasional restaurant meal higher in sodium. Had lengthy hospitalization followed by rehab following a fall with pelvis fractures on 01/19/22. Patient denies chest pain, lower extremity edema, fatigue, palpitations, melena, hematuria, hemoptysis, diaphoresis, weakness, presyncope, syncope, orthopnea, and PND.  Past Medical History:  Diagnosis Date   Diabetes ([place])    Hypertension    Nonrheumatic aortic (valve) stenosis 09/21/2020    Past Surgical History:  Procedure Laterality Date   CARDIAC VALVE REPLACEMENT     CHOLECYSTECTOMY      Current Medications: Current Meds  Medication Sig   Accu-Chek Softclix Lancets lancets 1 EACH BY DOES NOT APPLY ROUTE 3 (THREE) TIMES DAILY.   aspirin EC 81 MG tablet Take 1 tablet (81 mg total) by mouth daily. Swallow whole.   atorvastatin (LIPITOR) 40 MG tablet Take 1 tablet (40 mg total) by mouth daily.   Blood Glucose Monitoring Suppl (ACCU-CHEK GUIDE) w/Device KIT 1 each by Does not apply route 2 (two) times daily as needed.   glucose blood (ACCU-CHEK GUIDE) test strip 1 EACH BY OTHER ROUTE 2 (TWO) TIMES DAILY AS NEEDED. USE AS INSTRUCTED   Lancets Misc. (ACCU-CHEK SOFTCLIX LANCET DEV) KIT 1 each  by Does not apply route 3 (three) times daily.   lisinopril-hydrochlorothiazide (ZESTORETIC) 20-25 MG tablet Take 1 tablet by mouth daily.   metFORMIN (GLUCOPHAGE) 500 MG tablet Take 1 tablet (500 mg total) by mouth daily with breakfast. Take 2 every morning and 1 every evening   polyethylene glycol (MIRALAX / GLYCOLAX) 17 g packet Take 17 g by mouth daily.     Allergies:   Cephalosporins   Social History   Socioeconomic History   Marital  status: Widowed    Spouse name: Not on file   Number of children: Not on file   Years of education: Not on file   Highest education level: Not on file  Occupational History   Not on file  Tobacco Use   Smoking status: Never   Smokeless tobacco: Never  Substance and Sexual Activity   Alcohol use: Never   Drug use: Never   Sexual activity: Not Currently  Other Topics Concern   Not on file  Social History Narrative   Not on file   Social Determinants of Health   Financial Resource Strain: Not on file  Food Insecurity: Not on file  Transportation Needs: Not on file  Physical Activity: Not on file  Stress: Not on file  Social Connections: Not on file     Family History: The patient's family history is not on file.  ROS:   Please see the history of present illness.    + DOE All other systems reviewed and are negative.  Labs/Other Studies Reviewed:    The following studies were reviewed today:  Echo 12/14/20  1. Left ventricular ejection fraction, by estimation, is 50 to 55%. The  left ventricle has low normal function. The left ventricle has no regional  wall motion abnormalities. There is mild left ventricular hypertrophy.  Left ventricular diastolic  parameters are consistent with Grade I diastolic dysfunction (impaired  relaxation). Elevated left ventricular end-diastolic pressure. There is  incoordinate septal motion.   2. Right ventricular systolic function is mildly reduced. The right  ventricular size is normal.   3. The mitral valve is abnormal. Trivial mitral valve regurgitation.  Moderate mitral annular calcification.   4. The aortic valve is a Carpentier-Edwards 21 mm bioprosthetic. Aortic  valve regurgitation is not visualized. Aortic valve area, by VTI measures  1.09 cm. Aortic valve mean gradient measures 19.0 mmHg. Aortic valve Vmax  measures 2.82 m/s. Peak gradient   32 mmHg. DI is 0.38. Findings are likely normal given the size/position  of this  valve.   5. The inferior vena cava is normal in size with greater than 50%  respiratory variability, suggesting right atrial pressure of 3 mmHg.   Comparison(s): Prior images unable to be directly viewed, comparison made  by report only. Changes from prior study are noted. 07/13/18: An echo was  last performed at [facility] in 2019. LVEF was reportedly 55%.  bioprosthetic AVR mean gradient of 16  mmHg.    Recent Labs: 11/01/2021: TSH 1.010 07/12/2022: ALT 10; BUN 20; Creatinine, Ser 0.82; Hemoglobin 13.0; Platelets 228; Potassium 3.8; Sodium 149  Recent Lipid Panel    Component Value Date/Time   CHOL 167 07/12/2022 1520   TRIG 99 07/12/2022 1520   HDL 48 07/12/2022 1520   CHOLHDL 3.5 07/12/2022 1520   CHOLHDL 4.1 12/14/2020 0431   VLDL 14 12/14/2020 0431   LDLCALC 101 (H) 07/12/2022 1520     Risk Assessment/Calculations:       Physical Exam:  VS:  BP 130/82   Pulse 84   Ht '5\' 6"'  (1.676 m)   Wt 148 lb 3.2 oz (67.2 kg)   SpO2 96%   BMI 23.92 kg/m     Wt Readings from Last 3 Encounters:  07/13/22 148 lb 3.2 oz (67.2 kg)  07/12/22 147 lb (66.7 kg)  03/01/22 146 lb (66.2 kg)     GEN:  Frail elderly female in no acute distress HEENT: Normal NECK: No JVD; No carotid bruits CARDIAC: RRR, slight murmur consistent with AS/AVR auscultated at bilateral upper sternal borders. No rubs, gallops RESPIRATORY:  Clear to auscultation without rales, wheezing or rhonchi  ABDOMEN: Soft, non-tender, non-distended MUSCULOSKELETAL:  No edema; No deformity. 2+ pedal pulses, equal bilaterally SKIN: Warm and dry NEUROLOGIC:  Alert and oriented x 3 PSYCHIATRIC:  Normal affect   EKG:  EKG is not ordered today.    Diagnoses:    1. S/P AVR (aortic valve replacement)   2. Left bundle branch block   3. Essential hypertension   4. Mixed hyperlipidemia   5. Nonrheumatic aortic (valve) stenosis    Assessment and Plan:     Dyspnea: She reports DOE, not significantly increased  recently. No orthopnea, PND, edema. Limits sodium intake. Admits to being inactive. Encouraged her to walk around for several minutes each hour while awake. Daughter states she was also advised to increase activity by PCP at office visit on 07/12/22.  We will repeat echocardiogram in 5 to 6 months.  Aortic valve stenosis s/p AVR: No concerns for worsening valve function today. We will get follow-up echocardiogram in 5-6 months prior to follow-up appointment.  Left bundle branch block: Low normal LVEF 50-55% by echo 11/2020. No indication of bradycardia.  No episodes of lightheadedness, presyncope, syncope. We will repeat echo in 6 months, prior to follow-up appointment.   Hypertension: BP initally elevated, improved upon my recheck. Concern for recent increase in DBP > 90 mmHg x 2 weeks. Saw PCP 7/18 and lisinopril was changed to lisinopril-hctz. Lab work revealed hypernatremia, advised to increase water intake. Was additionally advised to increase exercise.   Hypernatremia: Na+ 149 on 07/12/22. Was advised by PCP to increase water intake.    Disposition: 6 months with Dr. Tamala Julian with echo prior   Medication Adjustments/Labs and Tests Ordered: Current medicines are reviewed at length with the patient today.  Concerns regarding medicines are outlined above.  Orders Placed This Encounter  Procedures   ECHOCARDIOGRAM COMPLETE   No orders of the defined types were placed in this encounter.   Patient Instructions  Medication Instructions:   Your physician recommends that you continue on your current medications as directed. Please refer to the Current Medication list given to you today.   *If you need a refill on your cardiac medications before your next appointment, please call your pharmacy*   Lab Work:  None ordered.   If you have labs (blood work) drawn today and your tests are completely normal, you will receive your results only by: [place] (if you have MyChart) OR A paper  copy in the mail If you have any lab test that is abnormal or we need to change your treatment, we will call you to review the results.   Testing/Procedures:  Your physician has requested that you have an echocardiogram. Echocardiography is a painless test that uses sound waves to create images of your heart. It provides your doctor with information about the size and shape of your heart and how well your  heart's chambers and valves are working. This procedure takes approximately one hour. There are no restrictions for this procedure. Wednesday, December 20 @ 11:15 am.   Follow-Up: At [facility], you and your health needs are our priority.  As part of our continuing mission to provide you with exceptional heart care, we have created designated Provider Care Teams.  These Care Teams include your primary Cardiologist (physician) and Advanced Practice Providers (APPs -  Physician Assistants and Nurse Practitioners) who all work together to provide you with the care you need, when you need it.  We recommend signing up for the patient portal called "MyChart".  Sign up information is provided on this After Visit Summary.  MyChart is used to connect with patients for Virtual Visits (Telemedicine).  Patients are able to view lab/test results, encounter notes, upcoming appointments, etc.  Non-urgent messages can be sent to your provider as well.   To learn more about what you can do with MyChart, go to [email or web address].    Your next appointment:   6 month(s)  The format for your next appointment:   In Person  Provider:   Sinclair Grooms, MD     Other Instructions  Your physician wants you to follow-up in: 6 months with Dr. Tamala Julian.  You will receive a reminder letter in the mail two months in advance. If you don't receive a letter, please call our office to schedule the follow-up appointment.   Important Information About Sugar         Signed, Emmaline Life, NP   07/13/2022 3:03 PM    [facility]

## 2022-07-13 ENCOUNTER — Ambulatory Visit: Payer: Medicare PPO | Admitting: Nurse Practitioner

## 2022-07-13 ENCOUNTER — Encounter: Payer: Self-pay | Admitting: Nurse Practitioner

## 2022-07-13 VITALS — BP 130/82 | HR 84 | Ht 66.0 in | Wt 148.2 lb

## 2022-07-13 DIAGNOSIS — I447 Left bundle-branch block, unspecified: Secondary | ICD-10-CM

## 2022-07-13 DIAGNOSIS — I350 Nonrheumatic aortic (valve) stenosis: Secondary | ICD-10-CM | POA: Diagnosis not present

## 2022-07-13 DIAGNOSIS — Z952 Presence of prosthetic heart valve: Secondary | ICD-10-CM | POA: Diagnosis not present

## 2022-07-13 DIAGNOSIS — E782 Mixed hyperlipidemia: Secondary | ICD-10-CM | POA: Diagnosis not present

## 2022-07-13 DIAGNOSIS — E870 Hyperosmolality and hypernatremia: Secondary | ICD-10-CM | POA: Diagnosis not present

## 2022-07-13 DIAGNOSIS — I10 Essential (primary) hypertension: Secondary | ICD-10-CM

## 2022-07-13 LAB — CBC WITH DIFFERENTIAL/PLATELET
Basophils Absolute: 0.1 10*3/uL (ref 0.0–0.2)
Basos: 1 %
EOS (ABSOLUTE): 0.2 10*3/uL (ref 0.0–0.4)
Eos: 3 %
Hematocrit: 40.4 % (ref 34.0–46.6)
Hemoglobin: 13 g/dL (ref 11.1–15.9)
Immature Grans (Abs): 0.2 10*3/uL — ABNORMAL HIGH (ref 0.0–0.1)
Immature Granulocytes: 2 %
Lymphocytes Absolute: 2.6 10*3/uL (ref 0.7–3.1)
Lymphs: 30 %
MCH: 28 pg (ref 26.6–33.0)
MCHC: 32.2 g/dL (ref 31.5–35.7)
MCV: 87 fL (ref 79–97)
Monocytes Absolute: 0.7 10*3/uL (ref 0.1–0.9)
Monocytes: 8 %
Neutrophils Absolute: 4.9 10*3/uL (ref 1.4–7.0)
Neutrophils: 56 %
Platelets: 228 10*3/uL (ref 150–450)
RBC: 4.65 x10E6/uL (ref 3.77–5.28)
RDW: 15 % (ref 11.7–15.4)
WBC: 8.7 10*3/uL (ref 3.4–10.8)

## 2022-07-13 LAB — COMPREHENSIVE METABOLIC PANEL
ALT: 10 IU/L (ref 0–32)
AST: 13 IU/L (ref 0–40)
Albumin/Globulin Ratio: 1.5 (ref 1.2–2.2)
Albumin: 3.8 g/dL (ref 3.7–4.7)
Alkaline Phosphatase: 78 IU/L (ref 44–121)
BUN/Creatinine Ratio: 24 (ref 12–28)
BUN: 20 mg/dL (ref 8–27)
Bilirubin Total: 0.5 mg/dL (ref 0.0–1.2)
CO2: 19 mmol/L — ABNORMAL LOW (ref 20–29)
Calcium: 8.8 mg/dL (ref 8.7–10.3)
Chloride: 111 mmol/L — ABNORMAL HIGH (ref 96–106)
Creatinine, Ser: 0.82 mg/dL (ref 0.57–1.00)
Globulin, Total: 2.6 g/dL (ref 1.5–4.5)
Glucose: 92 mg/dL (ref 70–99)
Potassium: 3.8 mmol/L (ref 3.5–5.2)
Sodium: 149 mmol/L — ABNORMAL HIGH (ref 134–144)
Total Protein: 6.4 g/dL (ref 6.0–8.5)
eGFR: 69 mL/min/{1.73_m2} (ref >59)

## 2022-07-13 LAB — LIPID PANEL
Chol/HDL Ratio: 3.5 ratio (ref 0.0–4.4)
Cholesterol, Total: 167 mg/dL (ref 100–199)
HDL: 48 mg/dL (ref >39)
LDL Chol Calc (NIH): 101 mg/dL — ABNORMAL HIGH (ref 0–99)
Triglycerides: 99 mg/dL (ref 0–149)
VLDL Cholesterol Cal: 18 mg/dL (ref 5–40)

## 2022-07-13 LAB — CARDIOVASCULAR RISK ASSESSMENT

## 2022-07-13 LAB — HEMOGLOBIN A1C
Est. average glucose Bld gHb Est-mCnc: 154 mg/dL
Hgb A1c MFr Bld: 7 % — ABNORMAL HIGH (ref 4.8–5.6)

## 2022-07-13 NOTE — Telephone Encounter (Signed)
Patient saw Christen Bame, NP today 07/13/22 with no changes to medication. Echo scheduled for 12/14/22. F/U with Dr. Tamala Julian in 6 months.

## 2022-07-13 NOTE — Patient Instructions (Signed)
Medication Instructions:   Your physician recommends that you continue on your current medications as directed. Please refer to the Current Medication list given to you today.   *If you need a refill on your cardiac medications before your next appointment, please call your pharmacy*   Lab Work:  None ordered.   If you have labs (blood work) drawn today and your tests are completely normal, you will receive your results only by: [place] (if you have MyChart) OR A paper copy in the mail If you have any lab test that is abnormal or we need to change your treatment, we will call you to review the results.   Testing/Procedures:  Your physician has requested that you have an echocardiogram. Echocardiography is a painless test that uses sound waves to create images of your heart. It provides your doctor with information about the size and shape of your heart and how well your heart's chambers and valves are working. This procedure takes approximately one hour. There are no restrictions for this procedure. Wednesday, December 20 @ 11:15 am.   Follow-Up: At [facility], you and your health needs are our priority.  As part of our continuing mission to provide you with exceptional heart care, we have created designated Provider Care Teams.  These Care Teams include your primary Cardiologist (physician) and Advanced Practice Providers (APPs -  Physician Assistants and Nurse Practitioners) who all work together to provide you with the care you need, when you need it.  We recommend signing up for the patient portal called "MyChart".  Sign up information is provided on this After Visit Summary.  MyChart is used to connect with patients for Virtual Visits (Telemedicine).  Patients are able to view lab/test results, encounter notes, upcoming appointments, etc.  Non-urgent messages can be sent to your provider as well.   To learn more about what you can do with MyChart, go to  [email or web address].    Your next appointment:   6 month(s)  The format for your next appointment:   In Person  Provider:   Sinclair Grooms, MD     Other Instructions  Your physician wants you to follow-up in: 6 months with Dr. Tamala Julian.  You will receive a reminder letter in the mail two months in advance. If you don't receive a letter, please call our office to schedule the follow-up appointment.   Important Information About Sugar

## 2022-07-13 NOTE — Progress Notes (Signed)
Sodium 149 high, encourage fluids , liver tests normal,  A1c 7.0, LDL cholesterol 101, few immature granulocytes in face of pelvic fractures, refer to hematology lp

## 2022-07-14 ENCOUNTER — Other Ambulatory Visit: Payer: Self-pay | Admitting: Legal Medicine

## 2022-07-14 DIAGNOSIS — Z7189 Other specified counseling: Secondary | ICD-10-CM

## 2022-07-15 ENCOUNTER — Telehealth: Payer: Self-pay | Admitting: Oncology

## 2022-07-15 NOTE — Telephone Encounter (Signed)
Scheduled appt per 7/20 referral. Pt's daughter is aware of appt date and time. Pt's daughter is aware to arrive 15 mins prior to appt time and to bring and updated insurance card. Pt's daughter is aware of appt location.

## 2022-07-20 ENCOUNTER — Encounter: Payer: Self-pay | Admitting: Legal Medicine

## 2022-07-20 ENCOUNTER — Ambulatory Visit (INDEPENDENT_AMBULATORY_CARE_PROVIDER_SITE_OTHER): Payer: Medicare PPO | Admitting: Legal Medicine

## 2022-07-20 VITALS — BP 110/70 | HR 91 | Temp 97.7°F | Resp 15 | Ht 66.0 in | Wt 146.0 lb

## 2022-07-20 DIAGNOSIS — G459 Transient cerebral ischemic attack, unspecified: Secondary | ICD-10-CM

## 2022-07-20 DIAGNOSIS — Z6823 Body mass index (BMI) 23.0-23.9, adult: Secondary | ICD-10-CM | POA: Insufficient documentation

## 2022-07-20 DIAGNOSIS — I10 Essential (primary) hypertension: Secondary | ICD-10-CM

## 2022-07-20 DIAGNOSIS — R531 Weakness: Secondary | ICD-10-CM | POA: Diagnosis not present

## 2022-07-20 NOTE — Progress Notes (Signed)
Acute Office Visit  Subjective:    Patient ID: Christine Newton, female    DOB: [birth year], 88 y.o.   MRN: [id number]  Chief Complaint  Patient presents with   Transient Ischemic Attack    HPI: Patient is in today for unresponsive episode 2 days ago. Home nurse said patient dropped her right arm and she was unresponsive and her eyes were wide open and she did not blink. She was diaphoretic. Nurse sitting her on the commode. Her jaw opened and started drooling for 10 minutes approximately. Then she resolved Her blood pressure 121/99,  45. She pressed the button of emergency. Firefighter came to the house and then EMS. T  Past Medical History:  Diagnosis Date   Diabetes ([place])    Hypertension    Nonrheumatic aortic (valve) stenosis 09/21/2020    Past Surgical History:  Procedure Laterality Date   CARDIAC VALVE REPLACEMENT     CHOLECYSTECTOMY      History reviewed. No pertinent family history.  Social History   Socioeconomic History   Marital status: Widowed    Spouse name: Not on file   Number of children: Not on file   Years of education: Not on file   Highest education level: Not on file  Occupational History   Not on file  Tobacco Use   Smoking status: Never   Smokeless tobacco: Never  Substance and Sexual Activity   Alcohol use: Never   Drug use: Never   Sexual activity: Not Currently  Other Topics Concern   Not on file  Social History Narrative   Not on file   Social Determinants of Health   Financial Resource Strain: Not on file  Food Insecurity: Not on file  Transportation Needs: Not on file  Physical Activity: Not on file  Stress: Not on file  Social Connections: Not on file  Intimate Partner Violence: Not on file    Outpatient Medications Prior to Visit  Medication Sig Dispense Refill   Accu-Chek Softclix Lancets lancets 1 EACH BY DOES NOT APPLY ROUTE 3 (THREE) TIMES DAILY. 100 each 2   aspirin EC 81 MG tablet Take 1 tablet (81 mg total) by  mouth daily. Swallow whole. 30 tablet 11   atorvastatin (LIPITOR) 40 MG tablet Take 1 tablet (40 mg total) by mouth daily. 90 tablet 1   Blood Glucose Monitoring Suppl (ACCU-CHEK GUIDE) w/Device KIT 1 each by Does not apply route 2 (two) times daily as needed. 1 kit 0   glucose blood (ACCU-CHEK GUIDE) test strip 1 EACH BY OTHER ROUTE 2 (TWO) TIMES DAILY AS NEEDED. USE AS INSTRUCTED 200 strip 2   Lancets Misc. (ACCU-CHEK SOFTCLIX LANCET DEV) KIT 1 each by Does not apply route 3 (three) times daily. 1 kit 0   metFORMIN (GLUCOPHAGE) 500 MG tablet Take 1 tablet (500 mg total) by mouth daily with breakfast. Take 2 every morning and 1 every evening 90 tablet 2   polyethylene glycol (MIRALAX / GLYCOLAX) 17 g packet Take 17 g by mouth daily. 14 each 0   lisinopril-hydrochlorothiazide (ZESTORETIC) 20-25 MG tablet Take 1 tablet by mouth daily. 90 tablet 3   No facility-administered medications prior to visit.    Allergies  Allergen Reactions   Cephalosporins Nausea Only and Other (See Comments)    Other Reaction: Allergy    Review of Systems  Constitutional:  Negative for chills, fatigue and fever.  HENT:  Negative for congestion, ear pain and sore throat.   Eyes:  Negative for  visual disturbance.  Respiratory:  Negative for cough and shortness of breath.   Cardiovascular:  Negative for chest pain and palpitations.  Gastrointestinal:  Negative for abdominal pain, constipation, diarrhea, nausea and vomiting.  Endocrine: Negative for polydipsia, polyphagia and polyuria.  Genitourinary:  Negative for difficulty urinating and dysuria.  Musculoskeletal:  Negative for arthralgias, back pain and myalgias.  Skin:  Negative for rash.  Neurological:  Negative for headaches.  Psychiatric/Behavioral:  Negative for dysphoric mood. The patient is not nervous/anxious.        Objective:    Physical Exam Vitals reviewed.  Constitutional:      General: She is not in acute distress.    Appearance: Normal  appearance.  HENT:     Head: Normocephalic.     Right Ear: Tympanic membrane normal.     Left Ear: Tympanic membrane normal.     Nose: Nose normal.     Mouth/Throat:     Mouth: Mucous membranes are moist.     Pharynx: Oropharynx is clear.  Eyes:     Extraocular Movements: Extraocular movements intact.     Conjunctiva/sclera: Conjunctivae normal.     Pupils: Pupils are equal, round, and reactive to light.  Cardiovascular:     Rate and Rhythm: Normal rate and regular rhythm.     Pulses: Normal pulses.     Heart sounds: Normal heart sounds. No murmur heard.    No gallop.  Pulmonary:     Effort: Pulmonary effort is normal. No respiratory distress.     Breath sounds: Normal breath sounds. No wheezing.  Abdominal:     General: Abdomen is flat. Bowel sounds are normal. There is no distension.     Palpations: Abdomen is soft.     Tenderness: There is no abdominal tenderness.  Musculoskeletal:     Cervical back: Normal range of motion.  Neurological:     General: No focal deficit present.     Mental Status: She is alert. Mental status is at baseline.     Sensory: No sensory deficit.     Motor: No weakness.     Gait: Gait normal.     Deep Tendon Reflexes: Reflexes normal.     BP 110/70   Pulse 91   Temp 97.7 F (36.5 C)   Resp 15   Ht _0  (1.676 m)   Wt 146 lb (66.2 kg)   SpO2 94%   BMI 23.57 kg/m  Wt Readings from Last 3 Encounters:  07/20/22 146 lb (66.2 kg)  07/13/22 148 lb 3.2 oz (67.2 kg)  07/12/22 147 lb (66.7 kg)    Health Maintenance Due  Topic Date Due   OPHTHALMOLOGY EXAM  Never done   TETANUS/TDAP  Never done   Zoster Vaccines- Shingrix (1 of 2) Never done   DEXA SCAN  Never done   COVID-19 Vaccine (5 - Moderna series) 11/24/2021       Lab Results  Component Value Date   TSH 1.010 11/01/2021   Lab Results  Component Value Date   WBC 11.1 (H) 07/20/2022   HGB 13.0 07/20/2022   HCT 41.3 07/20/2022   MCV 89 07/20/2022   PLT 213 07/20/2022    Lab Results  Component Value Date   NA 145 (H) 07/20/2022   K 3.0 (L) 07/20/2022   CO2 27 07/20/2022   GLUCOSE 289 (H) 07/20/2022   BUN 31 (H) 07/20/2022   CREATININE 1.13 (H) 07/20/2022   BILITOT 0.5 07/20/2022   ALKPHOS 79 07/20/2022  AST 19 07/20/2022   ALT 16 07/20/2022   PROT 6.1 07/20/2022   ALBUMIN 3.5 (L) 07/20/2022   CALCIUM 8.5 (L) 07/20/2022   ANIONGAP 8 01/19/2022   EGFR 47 (L) 07/20/2022   Lab Results  Component Value Date   CHOL 167 07/12/2022   Lab Results  Component Value Date   HDL 48 07/12/2022   Lab Results  Component Value Date   LDLCALC 101 (H) 07/12/2022   Lab Results  Component Value Date   TRIG 99 07/12/2022   Lab Results  Component Value Date   CHOLHDL 3.5 07/12/2022   Lab Results  Component Value Date   HGBA1C 7.0 (H) 07/12/2022       Assessment & Plan:   Problem List Items Addressed This Visit       Cardiovascular and Mediastinum   Essential hypertension   Relevant Orders   CBC with Differential/Platelet (Completed)   Comprehensive metabolic panel (Completed) An individual hypertension care plan was established and reinforced today.  The patient's status was assessed using clinical findings on exam and labs or diagnostic tests. The patient's success at meeting treatment goals on disease specific evidence-based guidelines and found to be good controlled. SELF MANAGEMENT: The patient and I together assessed ways to personally work towards obtaining the recommended goals. RECOMMENDATIONS: avoid decongestants found in common cold remedies, decrease consumption of alcohol, perform routine monitoring of BP with home BP cuff, exercise, reduction of dietary salt, take medicines as prescribed, try not to miss doses and quit smoking.  Regular exercise and maintaining a healthy weight is needed.  Stress reduction may help. A CLINICAL SUMMARY including written plan identify barriers to care unique to individual due to social or financial  issues.  We attempt to mutually creat solutions for individual and family understanding.    TIA (transient ischemic attack) - Primary   Relevant Orders   Sedimentation rate (Completed)   Ambulatory referral to Neurology   VAS US CAROTID   CT HEAD WO CONTRAST (5MM) (Completed) Patient stable now, get stat CT head and carotid dopplers, refer to neurology and use asa     Other   BMI 23.0-23.9, adult BMI low, we discussed        Follow-up: Return in about 1 week (around 07/27/2022).  An After Visit Summary was printed and given to the patient.  Reinaldo Meeker, MD [name] [phone]

## 2022-07-21 ENCOUNTER — Other Ambulatory Visit: Payer: Self-pay | Admitting: Legal Medicine

## 2022-07-21 ENCOUNTER — Other Ambulatory Visit: Payer: Self-pay

## 2022-07-21 DIAGNOSIS — I10 Essential (primary) hypertension: Secondary | ICD-10-CM

## 2022-07-21 DIAGNOSIS — G459 Transient cerebral ischemic attack, unspecified: Secondary | ICD-10-CM

## 2022-07-21 DIAGNOSIS — E876 Hypokalemia: Secondary | ICD-10-CM

## 2022-07-21 LAB — CBC WITH DIFFERENTIAL/PLATELET
Basophils Absolute: 0 10*3/uL (ref 0.0–0.2)
Basos: 0 %
EOS (ABSOLUTE): 0.1 10*3/uL (ref 0.0–0.4)
Eos: 1 %
Hematocrit: 41.3 % (ref 34.0–46.6)
Hemoglobin: 13 g/dL (ref 11.1–15.9)
Lymphocytes Absolute: 2.6 10*3/uL (ref 0.7–3.1)
Lymphs: 23 %
MCH: 27.9 pg (ref 26.6–33.0)
MCHC: 31.5 g/dL (ref 31.5–35.7)
MCV: 89 fL (ref 79–97)
Monocytes Absolute: 0.4 10*3/uL (ref 0.1–0.9)
Monocytes: 4 %
Neutrophils Absolute: 6.9 10*3/uL (ref 1.4–7.0)
Neutrophils: 62 %
Platelets: 213 10*3/uL (ref 150–450)
RBC: 4.66 x10E6/uL (ref 3.77–5.28)
RDW: 14.4 % (ref 11.7–15.4)
WBC: 11.1 10*3/uL — ABNORMAL HIGH (ref 3.4–10.8)

## 2022-07-21 LAB — COMPREHENSIVE METABOLIC PANEL
ALT: 16 IU/L (ref 0–32)
AST: 19 IU/L (ref 0–40)
Albumin/Globulin Ratio: 1.3 (ref 1.2–2.2)
Albumin: 3.5 g/dL — ABNORMAL LOW (ref 3.7–4.7)
Alkaline Phosphatase: 79 IU/L (ref 44–121)
BUN/Creatinine Ratio: 27 (ref 12–28)
BUN: 31 mg/dL — ABNORMAL HIGH (ref 8–27)
Bilirubin Total: 0.5 mg/dL (ref 0.0–1.2)
CO2: 27 mmol/L (ref 20–29)
Calcium: 8.5 mg/dL — ABNORMAL LOW (ref 8.7–10.3)
Chloride: 102 mmol/L (ref 96–106)
Creatinine, Ser: 1.13 mg/dL — ABNORMAL HIGH (ref 0.57–1.00)
Globulin, Total: 2.6 g/dL (ref 1.5–4.5)
Glucose: 289 mg/dL — ABNORMAL HIGH (ref 70–99)
Potassium: 3 mmol/L — ABNORMAL LOW (ref 3.5–5.2)
Sodium: 145 mmol/L — ABNORMAL HIGH (ref 134–144)
Total Protein: 6.1 g/dL (ref 6.0–8.5)
eGFR: 47 mL/min/{1.73_m2} — ABNORMAL LOW (ref >59)

## 2022-07-21 LAB — IMMATURE CELLS: Metamyelocytes: 10 % — ABNORMAL HIGH (ref 0–0)

## 2022-07-21 LAB — SEDIMENTATION RATE: Sed Rate: 12 mm/hr (ref 0–40)

## 2022-07-21 MED ORDER — POTASSIUM CHLORIDE CRYS ER 20 MEQ PO TBCR: 20.0000 meq | EXTENDED_RELEASE_TABLET | Freq: Every day | ORAL | 0 refills | Status: DC

## 2022-07-21 MED ORDER — LISINOPRIL 20 MG PO TABS: 20.0000 mg | ORAL_TABLET | Freq: Every day | ORAL | 3 refills | Status: DC

## 2022-07-21 NOTE — Progress Notes (Signed)
Wbc 11.1 but no abnormal cells, glucose 289, kidneys stage 3a, sodium 145 high, potassium low 3.0, liver tests normal, change back to lisinopril without HCTZ, take potassium one a day for 10 days, 10 metamyelocytes- see oncology, ESR 12 normal, may need  MRI head also due to suprasellar mass lp

## 2022-07-29 ENCOUNTER — Other Ambulatory Visit: Payer: Self-pay

## 2022-07-29 DIAGNOSIS — G9389 Other specified disorders of brain: Secondary | ICD-10-CM

## 2022-08-01 ENCOUNTER — Telehealth: Payer: Self-pay

## 2022-08-01 NOTE — Telephone Encounter (Signed)
Vickie called with questions about Christine Newton's labs scheduled for tomorrow.  She was asking if she needs to fast and also she has three pills left and she wanted to know if it was to early to come in.  She was instructed non-fasting and to keep the appointment tomorrow.

## 2022-08-02 ENCOUNTER — Other Ambulatory Visit: Payer: Medicare PPO

## 2022-08-03 ENCOUNTER — Other Ambulatory Visit: Payer: Self-pay | Admitting: Oncology

## 2022-08-03 ENCOUNTER — Other Ambulatory Visit: Payer: Medicare PPO

## 2022-08-03 DIAGNOSIS — E875 Hyperkalemia: Secondary | ICD-10-CM | POA: Diagnosis not present

## 2022-08-03 DIAGNOSIS — G459 Transient cerebral ischemic attack, unspecified: Secondary | ICD-10-CM | POA: Diagnosis not present

## 2022-08-03 DIAGNOSIS — I6523 Occlusion and stenosis of bilateral carotid arteries: Secondary | ICD-10-CM | POA: Diagnosis not present

## 2022-08-03 DIAGNOSIS — C9590 Leukemia, unspecified not having achieved remission: Secondary | ICD-10-CM

## 2022-08-03 DIAGNOSIS — I771 Stricture of artery: Secondary | ICD-10-CM | POA: Diagnosis not present

## 2022-08-03 NOTE — Progress Notes (Signed)
[place]  [address] [place],  [place]  [address] [phone]  Clinic Day:  08/03/2022  Referring physician: Lillard Anes,*   HISTORY OF PRESENT ILLNESS:  The patient is a 88 y.o. female  *** who I was asked to consult upon for a questionable diagnosis of leukemia.   PAST MEDICAL HISTORY:   Past Medical History:  Diagnosis Date  . Diabetes ([place])   . Hypertension   . Nonrheumatic aortic (valve) stenosis 09/21/2020    PAST SURGICAL HISTORY:   Past Surgical History:  Procedure Laterality Date  . CARDIAC VALVE REPLACEMENT    . CHOLECYSTECTOMY      CURRENT MEDICATIONS:   Current Outpatient Medications  Medication Sig Dispense Refill  . Accu-Chek Softclix Lancets lancets 1 EACH BY DOES NOT APPLY ROUTE 3 (THREE) TIMES DAILY. 100 each 2  . aspirin EC 81 MG tablet Take 1 tablet (81 mg total) by mouth daily. Swallow whole. 30 tablet 11  . atorvastatin (LIPITOR) 40 MG tablet Take 1 tablet (40 mg total) by mouth daily. 90 tablet 1  . Blood Glucose Monitoring Suppl (ACCU-CHEK GUIDE) w/Device KIT 1 each by Does not apply route 2 (two) times daily as needed. 1 kit 0  . glucose blood (ACCU-CHEK GUIDE) test strip 1 EACH BY OTHER ROUTE 2 (TWO) TIMES DAILY AS NEEDED. USE AS INSTRUCTED 200 strip 2  . Lancets Misc. (ACCU-CHEK SOFTCLIX LANCET DEV) KIT 1 each by Does not apply route 3 (three) times daily. 1 kit 0  . lisinopril (ZESTRIL) 20 MG tablet Take 1 tablet (20 mg total) by mouth daily. 90 tablet 3  . metFORMIN (GLUCOPHAGE) 500 MG tablet Take 1 tablet (500 mg total) by mouth daily with breakfast. Take 2 every morning and 1 every evening 90 tablet 2  . polyethylene glycol (MIRALAX / GLYCOLAX) 17 g packet Take 17 g by mouth daily. 14 each 0  . potassium chloride SA (KLOR-CON M) 20 MEQ tablet Take 1 tablet (20 mEq total) by mouth daily. 10 tablet 0   No current facility-administered medications for this visit.    ALLERGIES:   Allergies   Allergen Reactions  . Cephalosporins Nausea Only and Other (See Comments)    Other Reaction: Allergy    FAMILY HISTORY:  No family history on file.  SOCIAL HISTORY:   reports that she has never smoked. She has never used smokeless tobacco. She reports that she does not drink alcohol and does not use drugs.  REVIEW OF SYSTEMS:  Review of Systems - Oncology   PHYSICAL EXAM:  There were no vitals taken for this visit. Wt Readings from Last 3 Encounters:  07/20/22 146 lb (66.2 kg)  07/13/22 148 lb 3.2 oz (67.2 kg)  07/12/22 147 lb (66.7 kg)   There is no height or weight on file to calculate BMI. Performance status (ECOG): {CHL ONC [id number] Physical Exam  LABS:      Latest Ref Rng & Units 07/20/2022    2:31 PM 07/12/2022    3:20 PM 03/01/2022   10:30 AM  CBC  WBC 3.4 - 10.8 x10E3/uL 11.1  8.7  9.0   Hemoglobin 11.1 - 15.9 g/dL 13.0  13.0  12.9   Hematocrit 34.0 - 46.6 % 41.3  40.4  40.3   Platelets 150 - 450 x10E3/uL 213  228  169       Latest Ref Rng & Units 07/20/2022    2:31 PM 07/12/2022    3:20 PM 03/01/2022  10:30 AM  CMP  Glucose 70 - 99 mg/dL 289  92  109   BUN 8 - 27 mg/dL '31  20  19   ' Creatinine 0.57 - 1.00 mg/dL 1.13  0.82  0.87   Sodium 134 - 144 mmol/L 145  149  146   Potassium 3.5 - 5.2 mmol/L 3.0  3.8  3.9   Chloride 96 - 106 mmol/L 102  111  107   CO2 20 - 29 mmol/L '27  19  23   ' Calcium 8.7 - 10.3 mg/dL 8.5  8.8  8.8   Total Protein 6.0 - 8.5 g/dL 6.1  6.4  6.3   Total Bilirubin 0.0 - 1.2 mg/dL 0.5  0.5  0.6   Alkaline Phos 44 - 121 IU/L 79  78  116   AST 0 - 40 IU/L '19  13  9   ' ALT 0 - 32 IU/L '16  10  7      ' No results found for: "CEA1", "CEA" / No results found for: "CEA1", "CEA" No results found for: "PSA1" No results found for: "CNP675" No results found for: "CAN125"  No results found for: "TOTALPROTELP", "ALBUMINELP", "A1GS", "A2GS", "BETS", "BETA2SER", "GAMS", "MSPIKE", "SPEI" No results found for: "TIBC", "FERRITIN",  "IRONPCTSAT" No results found for: "LDH"  No results found for: "AFPTUMOR", "TOTALPROTELP", "ALBUMINELP", "A1GS", "A2GS", "BETS", "BETA2SER", "GAMS", "MSPIKE", "SPEI", "LDH", "CEA1", "CEA", "PSA1", "IGASERUM", "IGGSERUM", "IGMSERUM", "THGAB", "THYROGLB"  Review Flowsheet        No data to display          STUDIES:  No results found.   ASSESSMENT & PLAN:  A 88 y.o. female who I was asked to consult upon for *** .The patient understands all the plans discussed today and is in agreement with them.  I do appreciate Lillard Anes,* for his new consult.   Merel Santoli Macarthur Critchley, MD

## 2022-08-04 ENCOUNTER — Encounter: Payer: Self-pay | Admitting: Oncology

## 2022-08-04 ENCOUNTER — Inpatient Hospital Stay: Payer: Medicare PPO | Attending: Oncology | Admitting: Oncology

## 2022-08-04 ENCOUNTER — Inpatient Hospital Stay: Payer: Medicare PPO

## 2022-08-04 DIAGNOSIS — I10 Essential (primary) hypertension: Secondary | ICD-10-CM | POA: Diagnosis not present

## 2022-08-04 DIAGNOSIS — Z85038 Personal history of other malignant neoplasm of large intestine: Secondary | ICD-10-CM | POA: Diagnosis not present

## 2022-08-04 DIAGNOSIS — E119 Type 2 diabetes mellitus without complications: Secondary | ICD-10-CM

## 2022-08-04 DIAGNOSIS — R799 Abnormal finding of blood chemistry, unspecified: Secondary | ICD-10-CM | POA: Insufficient documentation

## 2022-08-04 DIAGNOSIS — C9590 Leukemia, unspecified not having achieved remission: Secondary | ICD-10-CM

## 2022-08-04 DIAGNOSIS — D7289 Other specified disorders of white blood cells: Secondary | ICD-10-CM | POA: Diagnosis not present

## 2022-08-04 DIAGNOSIS — R930 Abnormal findings on diagnostic imaging of skull and head, not elsewhere classified: Secondary | ICD-10-CM | POA: Diagnosis not present

## 2022-08-04 LAB — COMPREHENSIVE METABOLIC PANEL
ALT: 14 IU/L (ref 0–32)
AST: 10 IU/L (ref 0–40)
Albumin/Globulin Ratio: 1.4 (ref 1.2–2.2)
Albumin: 3.7 g/dL (ref 3.7–4.7)
Alkaline Phosphatase: 86 IU/L (ref 44–121)
BUN/Creatinine Ratio: 25 (ref 12–28)
BUN: 24 mg/dL (ref 8–27)
Bilirubin Total: 0.4 mg/dL (ref 0.0–1.2)
CO2: 21 mmol/L (ref 20–29)
Calcium: 8.6 mg/dL — ABNORMAL LOW (ref 8.7–10.3)
Chloride: 105 mmol/L (ref 96–106)
Creatinine, Ser: 0.96 mg/dL (ref 0.57–1.00)
Globulin, Total: 2.7 g/dL (ref 1.5–4.5)
Glucose: 265 mg/dL — ABNORMAL HIGH (ref 70–99)
Potassium: 4.3 mmol/L (ref 3.5–5.2)
Sodium: 141 mmol/L (ref 134–144)
Total Protein: 6.4 g/dL (ref 6.0–8.5)
eGFR: 57 mL/min/{1.73_m2} — ABNORMAL LOW (ref >59)

## 2022-08-04 LAB — CBC: RBC: 4.55 (ref 3.87–5.11)

## 2022-08-04 LAB — CBC AND DIFFERENTIAL
HCT: 40 (ref 36–46)
Hemoglobin: 12.8 (ref 12.0–16.0)
Neutrophils Absolute: 6.01
Platelets: 152 10*3/uL (ref 150–400)
WBC: 9.7

## 2022-08-04 LAB — LACTATE DEHYDROGENASE: LDH: 253 U/L — ABNORMAL HIGH (ref 98–192)

## 2022-08-04 NOTE — Progress Notes (Signed)
Glucose 265, kidney stage 3a, calcium low 8.6, can take calcium supplement, Liver tests normal, potassium 4.3 now - normal lp

## 2022-08-09 ENCOUNTER — Other Ambulatory Visit: Payer: Self-pay

## 2022-08-09 DIAGNOSIS — G459 Transient cerebral ischemic attack, unspecified: Secondary | ICD-10-CM

## 2022-08-18 DIAGNOSIS — D352 Benign neoplasm of pituitary gland: Secondary | ICD-10-CM | POA: Diagnosis not present

## 2022-08-18 DIAGNOSIS — G9389 Other specified disorders of brain: Secondary | ICD-10-CM | POA: Diagnosis not present

## 2022-08-23 ENCOUNTER — Other Ambulatory Visit: Payer: Self-pay

## 2022-08-23 DIAGNOSIS — G9389 Other specified disorders of brain: Secondary | ICD-10-CM

## 2022-08-25 ENCOUNTER — Telehealth: Payer: Self-pay

## 2022-08-25 ENCOUNTER — Encounter: Payer: Self-pay | Admitting: Legal Medicine

## 2022-08-25 NOTE — Telephone Encounter (Signed)
Christine Newton daughter called for follow-up on her MRI and to discuss 2 loose stools that she had yesterday with bright red blood when wiping.  She denies abdominal pain or discomfort.  She has had no nausea or vomiting.  Dr. Tobie Poet advised that we proceed with the Neurology referral for the abnormal MRI.  She may continue the 81 mg Aspirin at this point but if she has more bloody stools she was instructed to stop the aspirin and call us back.  We will not start plavix at this time.  She will increase the lipitor to 80 mg daily as directed.

## 2022-08-31 ENCOUNTER — Encounter (HOSPITAL_COMMUNITY): Payer: Self-pay

## 2022-08-31 ENCOUNTER — Emergency Department (HOSPITAL_COMMUNITY): Payer: Medicare PPO

## 2022-08-31 ENCOUNTER — Telehealth: Payer: Self-pay | Admitting: Nurse Practitioner

## 2022-08-31 ENCOUNTER — Ambulatory Visit: Payer: Medicare PPO | Admitting: Neurology

## 2022-08-31 ENCOUNTER — Observation Stay (HOSPITAL_COMMUNITY)
Admission: EM | Admit: 2022-08-31 | Discharge: 2022-09-02 | Disposition: A | Discharge status: Home Health | Payer: Medicare PPO | Attending: Internal Medicine | Admitting: Internal Medicine

## 2022-08-31 ENCOUNTER — Encounter: Payer: Self-pay | Admitting: Neurology

## 2022-08-31 VITALS — BP 112/71 | HR 98 | Ht 66.0 in | Wt 140.0 lb

## 2022-08-31 DIAGNOSIS — Z79899 Other long term (current) drug therapy: Secondary | ICD-10-CM | POA: Diagnosis not present

## 2022-08-31 DIAGNOSIS — E119 Type 2 diabetes mellitus without complications: Secondary | ICD-10-CM | POA: Diagnosis not present

## 2022-08-31 DIAGNOSIS — E049 Nontoxic goiter, unspecified: Secondary | ICD-10-CM | POA: Insufficient documentation

## 2022-08-31 DIAGNOSIS — Z95818 Presence of other cardiac implants and grafts: Secondary | ICD-10-CM | POA: Diagnosis not present

## 2022-08-31 DIAGNOSIS — R55 Syncope and collapse: Principal | ICD-10-CM

## 2022-08-31 DIAGNOSIS — E876 Hypokalemia: Secondary | ICD-10-CM | POA: Diagnosis present

## 2022-08-31 DIAGNOSIS — G459 Transient cerebral ischemic attack, unspecified: Secondary | ICD-10-CM | POA: Diagnosis not present

## 2022-08-31 DIAGNOSIS — R569 Unspecified convulsions: Secondary | ICD-10-CM

## 2022-08-31 DIAGNOSIS — D72829 Elevated white blood cell count, unspecified: Secondary | ICD-10-CM | POA: Diagnosis not present

## 2022-08-31 DIAGNOSIS — Z7982 Long term (current) use of aspirin: Secondary | ICD-10-CM | POA: Diagnosis not present

## 2022-08-31 DIAGNOSIS — F03B0 Unspecified dementia, moderate, without behavioral disturbance, psychotic disturbance, mood disturbance, and anxiety: Secondary | ICD-10-CM

## 2022-08-31 DIAGNOSIS — I6381 Other cerebral infarction due to occlusion or stenosis of small artery: Secondary | ICD-10-CM

## 2022-08-31 DIAGNOSIS — D352 Benign neoplasm of pituitary gland: Secondary | ICD-10-CM | POA: Diagnosis not present

## 2022-08-31 DIAGNOSIS — I672 Cerebral atherosclerosis: Secondary | ICD-10-CM | POA: Diagnosis not present

## 2022-08-31 DIAGNOSIS — F0390 Unspecified dementia without behavioral disturbance: Secondary | ICD-10-CM | POA: Diagnosis present

## 2022-08-31 DIAGNOSIS — Z7984 Long term (current) use of oral hypoglycemic drugs: Secondary | ICD-10-CM | POA: Diagnosis not present

## 2022-08-31 DIAGNOSIS — R6889 Other general symptoms and signs: Secondary | ICD-10-CM

## 2022-08-31 DIAGNOSIS — I10 Essential (primary) hypertension: Secondary | ICD-10-CM | POA: Insufficient documentation

## 2022-08-31 DIAGNOSIS — M6281 Muscle weakness (generalized): Secondary | ICD-10-CM | POA: Insufficient documentation

## 2022-08-31 DIAGNOSIS — E871 Hypo-osmolality and hyponatremia: Secondary | ICD-10-CM | POA: Diagnosis not present

## 2022-08-31 DIAGNOSIS — R2681 Unsteadiness on feet: Secondary | ICD-10-CM | POA: Diagnosis not present

## 2022-08-31 DIAGNOSIS — I5032 Chronic diastolic (congestive) heart failure: Secondary | ICD-10-CM | POA: Diagnosis present

## 2022-08-31 DIAGNOSIS — I110 Hypertensive heart disease with heart failure: Secondary | ICD-10-CM | POA: Diagnosis present

## 2022-08-31 DIAGNOSIS — I771 Stricture of artery: Secondary | ICD-10-CM | POA: Diagnosis not present

## 2022-08-31 DIAGNOSIS — G238 Other specified degenerative diseases of basal ganglia: Secondary | ICD-10-CM | POA: Diagnosis not present

## 2022-08-31 LAB — CBC WITH DIFFERENTIAL/PLATELET
Abs Immature Granulocytes: 0.76 10*3/uL — ABNORMAL HIGH (ref 0.00–0.07)
Basophils Absolute: 0.2 10*3/uL — ABNORMAL HIGH (ref 0.0–0.1)
Basophils Relative: 1 %
Eosinophils Absolute: 0.3 10*3/uL (ref 0.0–0.5)
Eosinophils Relative: 3 %
HCT: 38.1 % (ref 36.0–46.0)
Hemoglobin: 11.9 g/dL — ABNORMAL LOW (ref 12.0–15.0)
Immature Granulocytes: 6 %
Lymphocytes Relative: 22 %
Lymphs Abs: 2.7 10*3/uL (ref 0.7–4.0)
MCH: 28.7 pg (ref 26.0–34.0)
MCHC: 31.2 g/dL (ref 30.0–36.0)
MCV: 92 fL (ref 80.0–100.0)
Monocytes Absolute: 0.8 10*3/uL (ref 0.1–1.0)
Monocytes Relative: 7 %
Neutro Abs: 7.8 10*3/uL — ABNORMAL HIGH (ref 1.7–7.7)
Neutrophils Relative %: 61 %
Platelets: 188 10*3/uL (ref 150–400)
RBC: 4.14 MIL/uL (ref 3.87–5.11)
RDW: 16 % — ABNORMAL HIGH (ref 11.5–15.5)
WBC: 12.5 10*3/uL — ABNORMAL HIGH (ref 4.0–10.5)
nRBC: 0.2 % (ref 0.0–0.2)

## 2022-08-31 LAB — BASIC METABOLIC PANEL
Anion gap: 10 (ref 5–15)
BUN: 35 mg/dL — ABNORMAL HIGH (ref 8–23)
CO2: 23 mmol/L (ref 22–32)
Calcium: 8.6 mg/dL — ABNORMAL LOW (ref 8.9–10.3)
Chloride: 112 mmol/L — ABNORMAL HIGH (ref 98–111)
Creatinine, Ser: 0.91 mg/dL (ref 0.44–1.00)
GFR, Estimated: >60 mL/min (ref >60)
Glucose, Bld: 123 mg/dL — ABNORMAL HIGH (ref 70–99)
Potassium: 3.1 mmol/L — ABNORMAL LOW (ref 3.5–5.1)
Sodium: 145 mmol/L (ref 135–145)

## 2022-08-31 LAB — URINALYSIS, ROUTINE W REFLEX MICROSCOPIC
Bilirubin Urine: NEGATIVE
Glucose, UA: NEGATIVE mg/dL
Hgb urine dipstick: NEGATIVE
Ketones, ur: NEGATIVE mg/dL
Nitrite: NEGATIVE
Protein, ur: NEGATIVE mg/dL
Specific Gravity, Urine: 1.038 — ABNORMAL HIGH (ref 1.005–1.030)
pH: 6 (ref 5.0–8.0)

## 2022-08-31 LAB — MAGNESIUM: Magnesium: 2.1 mg/dL (ref 1.7–2.4)

## 2022-08-31 MED ORDER — SODIUM CHLORIDE 0.9% FLUSH
3.0000 mL | Freq: Two times a day (BID) | INTRAVENOUS | Status: DC
  Administered 2022-09-01 – 2022-09-02 (×4): 3 mL via INTRAVENOUS

## 2022-08-31 MED ORDER — ATORVASTATIN CALCIUM 40 MG PO TABS
80.0000 mg | ORAL_TABLET | Freq: Every day | ORAL | Status: DC
  Filled 2022-08-31: qty 2

## 2022-08-31 MED ORDER — IOHEXOL 350 MG/ML SOLN
75.0000 mL | Freq: Once | INTRAVENOUS | Status: AC | PRN
  Administered 2022-08-31: 75 mL via INTRAVENOUS

## 2022-08-31 MED ORDER — INSULIN ASPART 100 UNIT/ML IJ SOLN
0.0000 [IU] | Freq: Three times a day (TID) | INTRAMUSCULAR | Status: DC
  Administered 2022-09-02: 1 [IU] via SUBCUTANEOUS
  Filled 2022-08-31: qty 0.06

## 2022-08-31 MED ORDER — ENOXAPARIN SODIUM 40 MG/0.4ML IJ SOSY
40.0000 mg | PREFILLED_SYRINGE | INTRAMUSCULAR | Status: DC
  Administered 2022-09-01 – 2022-09-02 (×2): 40 mg via SUBCUTANEOUS
  Filled 2022-08-31 (×2): qty 0.4

## 2022-08-31 MED ORDER — POTASSIUM CHLORIDE 20 MEQ PO PACK
40.0000 meq | PACK | Freq: Once | ORAL | Status: AC
Start: 2022-08-31 — End: 2022-08-31
  Administered 2022-08-31: 40 meq via ORAL
  Filled 2022-08-31: qty 2

## 2022-08-31 MED ORDER — ASPIRIN 81 MG PO TBEC
81.0000 mg | DELAYED_RELEASE_TABLET | Freq: Every day | ORAL | Status: DC
  Administered 2022-09-01 – 2022-09-02 (×2): 81 mg via ORAL
  Filled 2022-08-31 (×2): qty 1

## 2022-08-31 MED ORDER — ACETAMINOPHEN 325 MG PO TABS: 650.0000 mg | ORAL_TABLET | Freq: Four times a day (QID) | ORAL | Status: DC | PRN

## 2022-08-31 MED ORDER — POLYETHYLENE GLYCOL 3350 17 G PO PACK
17.0000 g | PACK | Freq: Every day | ORAL | Status: DC | PRN
Start: 2022-08-31 — End: 2022-09-02

## 2022-08-31 MED ORDER — ACETAMINOPHEN 650 MG RE SUPP: 650.0000 mg | Freq: Four times a day (QID) | RECTAL | Status: DC | PRN

## 2022-08-31 MED ORDER — INSULIN ASPART 100 UNIT/ML IJ SOLN
0.0000 [IU] | Freq: Every day | INTRAMUSCULAR | Status: DC
  Filled 2022-08-31: qty 0.05

## 2022-08-31 MED ORDER — LACTATED RINGERS IV BOLUS
500.0000 mL | Freq: Once | INTRAVENOUS | Status: AC
  Administered 2022-08-31: 500 mL via INTRAVENOUS

## 2022-08-31 MED ORDER — LISINOPRIL 20 MG PO TABS
20.0000 mg | ORAL_TABLET | Freq: Every day | ORAL | Status: DC
  Administered 2022-09-01 – 2022-09-02 (×2): 20 mg via ORAL
  Filled 2022-08-31 (×2): qty 1

## 2022-08-31 NOTE — Telephone Encounter (Signed)
Daughter, Florentina Jenny, called regarding patients low b/p states at 1300 her bp in the left arm was 103/73 and has not been given lisinopril. States patient is taking ASA 81 daily but has not had rectal bleeding and normal BM's. Per Dr. Tobie Poet patient should still be evaluated at the ER, informed Vikki who expressed understanding and agreed to take patient to ER for evaluation.

## 2022-08-31 NOTE — H&P (Signed)
History and Physical    Christine Newton RSW:[id number] DOB: 09-Nov-1933 DOA: 08/31/2022  PCP: Lillard Anes, MD   Patient coming from: Home   Chief Complaint: Unresponsive episodes   HPI: Christine Newton is a pleasant 88 y.o. female with medical history significant for hypertension, type 2 diabetes mellitus, and dementia, now presenting to the emergency department for evaluation of unresponsive episodes.  Patient is accompanied by her daughter who assists with the history.  Patient's daughter witnessed an episode in December where the patient slumped over in her chair and was not responding for approximately a minute before returning to her usual state.  She has gone on to have several similar episodes since then with increased frequency over the past couple weeks.  The patient's caregiver has witnessed these episodes and described the patient slumping over in her chair and not responding momentarily before regaining awareness.  She seems to return to her usual state within a minute or so.  There is no shaking observed with these episodes.  Patient is incontinent of urine at baseline.  Patient denies any chest pain or palpitations, denies headache, and denies any associated nausea, vomiting, tongue bite, or other injury associated with these.  She has been undergoing work-up in the outpatient setting for this and had recent MRI brain that was negative for acute findings and EEG notable for left temporal slowing.  She had been started on Keppra but developed diarrhea and stopped it.    She saw her neurologist again today in follow-up and it was advised that she present to the emergency department for expedited work-up to include CTA of the head and neck, echocardiogram, EEG, and 30-day monitor.  ED Course: Upon arrival to the ED, patient is found to be afebrile and saturating well on room air with systolic blood pressure 093 and greater.  EKG features sinus rhythm with chronic LBBB.   CTA of the head and neck is negative for acute findings but incidentally notable for a multinodular goiter.  She was given 500 mL of LR and 40 mill equivalents of oral potassium in the ED.  Review of Systems:  All other systems reviewed and apart from HPI, are negative.  Past Medical History:  Diagnosis Date   Diabetes ([place])    Hypertension    Nonrheumatic aortic (valve) stenosis 09/21/2020    Past Surgical History:  Procedure Laterality Date   CARDIAC VALVE REPLACEMENT     CHOLECYSTECTOMY      Social History:   reports that she has never smoked. She has never used smokeless tobacco. She reports that she does not drink alcohol and does not use drugs.  Allergies  Allergen Reactions   Cephalosporins Nausea Only and Other (See Comments)    Other Reaction: Allergy    History reviewed. No pertinent family history.   Prior to Admission medications   Medication Sig Start Date End Date Taking? Authorizing Provider  Accu-Chek Softclix Lancets lancets 1 EACH BY DOES NOT APPLY ROUTE 3 (THREE) TIMES DAILY. 03/03/21   Lillard Anes, MD  aspirin EC 81 MG tablet Take 1 tablet (81 mg total) by mouth daily. Swallow whole. 01/21/21   Frann Rider, NP  atorvastatin (LIPITOR) 40 MG tablet Take 1 tablet (40 mg total) by mouth daily. Patient taking differently: Take 80 mg by mouth daily. 03/03/22   Lillard Anes, MD  Blood Glucose Monitoring Suppl (ACCU-CHEK GUIDE) w/Device KIT 1 each by Does not apply route 2 (two) times daily as needed. 06/18/20  Lillard Anes, MD  glucose blood (ACCU-CHEK GUIDE) test strip 1 EACH BY OTHER ROUTE 2 (TWO) TIMES DAILY AS NEEDED. USE AS INSTRUCTED 06/06/22   Lillard Anes, MD  Lancets Misc. (ACCU-CHEK SOFTCLIX LANCET DEV) KIT 1 each by Does not apply route 3 (three) times daily. 02/05/21   Lillard Anes, MD  lisinopril (ZESTRIL) 20 MG tablet Take 1 tablet (20 mg total) by mouth daily. 07/21/22   Lillard Anes, MD   metFORMIN (GLUCOPHAGE) 500 MG tablet Take 1 tablet (500 mg total) by mouth daily with breakfast. Take 2 every morning and 1 every evening 07/11/22   [name], [name], MD  polyethylene glycol (MIRALAX / GLYCOLAX) 17 g packet Take 17 g by mouth daily. 01/23/22   Lavina Hamman, MD  potassium chloride SA (KLOR-CON M) 20 MEQ tablet Take 1 tablet (20 mEq total) by mouth daily. 07/21/22   Lillard Anes, MD    Physical Exam: Vitals:   08/31/22 2215 08/31/22 2300 08/31/22 2325 09/01/22 0011  BP: 128/77 116/74  137/78  Pulse: 93 97  92  Resp: '16 17  18  ' Temp:   98 F (36.7 C) 98 F (36.7 C)  TempSrc:   Oral Oral  SpO2: 98% 98%  97%  Weight:      Height:        Constitutional: NAD, calm  Eyes: PERTLA, lids and conjunctivae normal ENMT: Mucous membranes are moist. Posterior pharynx clear of any exudate or lesions.   Neck: supple, no masses  Respiratory: no wheezing, no crackles. No accessory muscle use.  Cardiovascular: S1 & S2 heard, regular rate and rhythm. No extremity edema.   Abdomen: No distension, no tenderness, soft. Bowel sounds active.  Musculoskeletal: no clubbing / cyanosis. No joint deformity upper and lower extremities.   Skin: no significant rashes, lesions, ulcers. Warm, dry, well-perfused. Neurologic: CN 2-12 grossly intact. Moving all extremities.  Psychiatric: Pleasant. Cooperative.    Labs and Imaging on Admission: I have personally reviewed following labs and imaging studies  CBC: Recent Labs  Lab 08/31/22 1729  WBC 12.5*  NEUTROABS 7.8*  HGB 11.9*  HCT 38.1  MCV 92.0  PLT 579   Basic Metabolic Panel: Recent Labs  Lab 08/31/22 1729  NA 145  K 3.1*  CL 112*  CO2 23  GLUCOSE 123*  BUN 35*  CREATININE 0.91  CALCIUM 8.6*  MG 2.1   GFR: Estimated Creatinine Clearance: 40.8 mL/min (by C-G formula based on SCr of 0.91 mg/dL). Liver Function Tests: No results for input(s): "AST", "ALT", "ALKPHOS", "BILITOT", "PROT", "ALBUMIN" in the last 168  hours. No results for input(s): "LIPASE", "AMYLASE" in the last 168 hours. No results for input(s): "AMMONIA" in the last 168 hours. Coagulation Profile: No results for input(s): "INR", "PROTIME" in the last 168 hours. Cardiac Enzymes: No results for input(s): "CKTOTAL", "CKMB", "CKMBINDEX", "TROPONINI" in the last 168 hours. BNP (last 3 results) No results for input(s): "PROBNP" in the last 8760 hours. HbA1C: No results for input(s): "HGBA1C" in the last 72 hours. CBG: Recent Labs  Lab 09/01/22 0025  GLUCAP 144*   Lipid Profile: No results for input(s): "CHOL", "HDL", "LDLCALC", "TRIG", "CHOLHDL", "LDLDIRECT" in the last 72 hours. Thyroid Function Tests: No results for input(s): "TSH", "T4TOTAL", "FREET4", "T3FREE", "THYROIDAB" in the last 72 hours. Anemia Panel: No results for input(s): "VITAMINB12", "FOLATE", "FERRITIN", "TIBC", "IRON", "RETICCTPCT" in the last 72 hours. Urine analysis:    Component Value Date/Time   COLORURINE YELLOW 08/31/2022 2212  APPEARANCEUR CLEAR 08/31/2022 2212   LABSPEC 1.038 (H) 08/31/2022 2212   PHURINE 6.0 08/31/2022 2212   GLUCOSEU NEGATIVE 08/31/2022 2212   HGBUR NEGATIVE 08/31/2022 2212   BILIRUBINUR NEGATIVE 08/31/2022 2212   BILIRUBINUR negative 03/01/2022 1206   BILIRUBINUR Negative 11/01/2021 1152   [place] 08/31/2022 2212   PROTEINUR NEGATIVE 08/31/2022 2212   UROBILINOGEN 1.0 03/01/2022 1206   NITRITE NEGATIVE 08/31/2022 2212   LEUKOCYTESUR TRACE (A) 08/31/2022 2212   Sepsis Labs: '@LABRCNTIP' (procalcitonin:4,lacticidven:4) )No results found for this or any previous visit (from the past 240 hour(s)).   Radiological Exams on Admission: CT ANGIO HEAD NECK W WO CM  Result Date: 08/31/2022 CLINICAL DATA:  TIA, intermittent episodes of near syncope; recent MRI with small subacute lacunar infarcts EXAM: CT ANGIOGRAPHY HEAD AND NECK TECHNIQUE: Multidetector CT imaging of the head and neck was performed using the standard  protocol during bolus administration of intravenous contrast. Multiplanar CT image reconstructions and MIPs were obtained to evaluate the vascular anatomy. Carotid stenosis measurements (when applicable) are obtained utilizing NASCET criteria, using the distal internal carotid diameter as the denominator. RADIATION DOSE REDUCTION: This exam was performed according to the departmental dose-optimization program which includes automated exposure control, adjustment of the mA and/or kV according to patient size and/or use of iterative reconstruction technique. CONTRAST:  32m OMNIPAQUE IOHEXOL 350 MG/ML SOLN COMPARISON:  CT head 07/20/2022, MRI head 08/18/2022, MRA 12/13/2020 FINDINGS: CT HEAD FINDINGS Brain: No evidence of acute infarct, hemorrhage, mass, mass effect, or midline shift. No hydrocephalus or extra-axial fluid collection. Basal ganglia calcifications. Redemonstrated encephalomalacia in the left parieto-occipital region, likely sequela of remote infarct. Lacunar infarcts in the right corona radiata and centrum semiovale correlate with the subacute infarcts seen on 08/18/2022. No other new hypodensity. Periventricular white matter changes, likely the sequela of chronic small vessel ischemic disease. Redemonstrated intra sellar mass with suprasellar extension, measuring up to 2.4 cm (series 10, image 34), unchanged when compared to both the prior CT head and the most recent MRI. Vascular: No hyperdense vessel. Atherosclerotic calcifications in the intracranial carotid and vertebral arteries. Skull: Normal. Negative for fracture or focal lesion. Sinuses/Orbits: Mucosal thickening in the sphenoid sinuses. Status post bilateral lens replacements. Other: The mastoid air cells are well aerated. CTA NECK FINDINGS Aortic arch: Two-vessel arch with a common origin of the brachiocephalic and left common carotid arteries. Imaged portion shows no evidence of aneurysm or dissection. Approximate 50% stenosis of the  proximal left subclavian artery (series 11, image 40). No other significant stenosis of the major arch vessel origins. Right carotid system: No evidence of dissection, occlusion, or hemodynamically significant stenosis (greater than 50%). Atherosclerotic disease at the bifurcation and in the proximal and distal ICA is not hemodynamically significant. Left carotid system: No evidence of dissection, occlusion, or hemodynamically significant stenosis (greater than 50%). Atherosclerotic disease in the distal ICA is not hemodynamically significant. Vertebral arteries: No evidence of dissection, occlusion, or hemodynamically significant stenosis (greater than 50%). Skeleton: Osteopenia.  No acute osseous abnormality. Other neck: Multinodular goiter. Upper chest: Negative. Review of the MIP images confirms the above findings CTA HEAD FINDINGS Anterior circulation: Both internal carotid arteries are patent to the termini, without significant stenosis. Patent left A1. Aplastic right A1. Normal anterior communicating artery. Anterior cerebral arteries are patent to their distal aspects. No M1 stenosis or occlusion. MCA branches perfused and symmetric. Posterior circulation: Vertebral arteries patent to the vertebrobasilar junction without stenosis. Posterior inferior cerebellar arteries patent proximally. Basilar patent to its distal aspect.  Superior cerebellar arteries patent proximally. Patent left P1. Fetal origin of the right PCA from the right posterior communicating artery. PCAs perfused to their distal aspects without stenosis. The left posterior communicating artery is not definitively visualized. Venous sinuses: As permitted by contrast timing, patent. Anatomic variants: Fetal origin of the right PCA. Aplastic right A1. Review of the MIP images confirms the above findings IMPRESSION: 1.  No intracranial large vessel occlusion or significant stenosis. 2. Approximate 50% stenosis of the proximal left subclavian artery.  No other hemodynamically significant stenosis in the neck. 3.  No acute intracranial process. 4. Redemonstrated sellar mass with suprasellar extension, which appears grossly similar to the prior CT head and most recent MRI. 5. Multinodular goiter. If this has not previously been evaluated, a non-emergent ultrasound of the thyroid is recommended, if clinically indicated given patient age. (Reference: J Am Coll Radiol. 2015 Feb;12(2): 143-50) Electronically Signed   By: Merilyn Baba M.D.   On: 08/31/2022 22:00    EKG: Independently reviewed. Sinus rhythm, chronic LBBB.   Assessment/Plan   1. Syncope  - Presents at the direction of her neurologist for evaluation of recurrent syncopal episodes  - Continue cardiac monitoring and check orthostatic vitals, TTE, and EEG; 30-day cardiac monitor also recommended by neurology    2. Hypertension  - Continue lisinopril    3. Type II DM  - A1c was 7.0% in July 2023   - Check CBGs and use low-intensity SSI for now   4. Dementia  - Delirium precautions    5. Hypokalemia  - Replaced in ED  6. Goiter  - Multinodular goiter noted incidentally on CT in ED  - Discussed with patient and her daughter, outpatient follow-up for consideration of [name] recommended    DVT prophylaxis: Lovenox   Code Status: DNR Level of Care: Level of care: Telemetry Family Communication: daughter at bedside  Disposition Plan:  Patient is from: home  Anticipated d/c is to: home  Anticipated d/c date is: 09/01/22  Patient currently: pending echocardiogram, EEG, and cardiac monitoring  Consults called: none  Admission status: Observation     Vianne Bulls, MD [facility]  09/01/2022, 2:30 AM

## 2022-08-31 NOTE — ED Triage Notes (Signed)
Pt presents with family with c/o possible seizure-like activity. They went to the neurologist today and they were referred to come for some further testing to rule out any seizures or TIA. Pt is alert per her baseline.

## 2022-08-31 NOTE — ED Provider Triage Note (Signed)
Emergency Medicine Provider Triage Evaluation Note  Christine Newton , a 88 y.o. female  was evaluated in triage.  Pt complains of small TIA.  She was seen by Dr. Leonie Man with neurology today.  Had outpatient MRI 2 weeks ago which showed subacute lacunar infarct.  Since then she has been having intermittent episodes of near syncope where she has allover weakness appears confused and disoriented.  She was previously on Keppra for possible seizures however was unable to tolerate this and was subsequently taken off by PCP.  Patient's daughter concerned that symptoms are increasing in nature.  Neurology recommended CTA head, neck, echocardiogram, EEG.  Family here for admission.  Review of Systems  Positive: Intermittent AMS Negative:   Physical Exam  BP 118/77   Pulse (!) 104   Temp 98.4 F (36.9 C) (Oral)   Resp 18   Ht '5\' 6"'$  (1.676 m)   Wt 63.5 kg   SpO2 100%   BMI 22.60 kg/m  Gen:   Awake, no distress   Resp:  Normal effort  MSK:   Moves extremities without difficulty  Other:    Medical Decision Making  Medically screening exam initiated at 5:32 PM.  Appropriate orders placed.  Christine Newton was informed that the remainder of the evaluation will be completed by another provider, this initial triage assessment does not replace that evaluation, and the importance of remaining in the ED until their evaluation is complete.  AMS   Terran Hollenkamp A, PA-C 08/31/22 1733

## 2022-08-31 NOTE — ED Provider Notes (Signed)
[facility] DEPT Provider Note   CSN: [id number] Arrival date & time: 08/31/22  1704     History  No chief complaint on file.   Christine Newton is a 88 y.o. female.  HPI 88 year old female presents for evaluation of seizures vs syncope vs TIA.  She was diagnosed with a subacute stroke with an MRI at the end of August.  She has been having multiple episodes (daughter/3) of passing out.  They seem to be brief and she will lose consciousness for a few seconds.  Patient denies headache, vision changes, chest pain, or new weakness/numbness.  Most recent episode occurred after seeing the neurologist.  The neurologist told him to come to [facility] for admission for work-up which includes CTA, EEG, etc.  Patient had blood in her diarrhea last week but none since.  No obvious seizure like activity noted. Daughter (who provides majority of history) notes she has not seen these episodes, it has primarily been witnessed by the caregivers.  Home Medications Prior to Admission medications   Medication Sig Start Date End Date Taking? Authorizing Provider  Accu-Chek Softclix Lancets lancets 1 EACH BY DOES NOT APPLY ROUTE 3 (THREE) TIMES DAILY. 03/03/21   Lillard Anes, MD  aspirin EC 81 MG tablet Take 1 tablet (81 mg total) by mouth daily. Swallow whole. 01/21/21   Frann Rider, NP  atorvastatin (LIPITOR) 40 MG tablet Take 1 tablet (40 mg total) by mouth daily. Patient taking differently: Take 80 mg by mouth daily. 03/03/22   Lillard Anes, MD  Blood Glucose Monitoring Suppl (ACCU-CHEK GUIDE) w/Device KIT 1 each by Does not apply route 2 (two) times daily as needed. 06/18/20   Lillard Anes, MD  glucose blood (ACCU-CHEK GUIDE) test strip 1 EACH BY OTHER ROUTE 2 (TWO) TIMES DAILY AS NEEDED. USE AS INSTRUCTED 06/06/22   Lillard Anes, MD  Lancets Misc. (ACCU-CHEK SOFTCLIX LANCET DEV) KIT 1 each by Does not apply route 3 (three) times daily.  02/05/21   Lillard Anes, MD  lisinopril (ZESTRIL) 20 MG tablet Take 1 tablet (20 mg total) by mouth daily. 07/21/22   Lillard Anes, MD  metFORMIN (GLUCOPHAGE) 500 MG tablet Take 1 tablet (500 mg total) by mouth daily with breakfast. Take 2 every morning and 1 every evening 07/11/22   [name], [name], MD  polyethylene glycol (MIRALAX / GLYCOLAX) 17 g packet Take 17 g by mouth daily. 01/23/22   Lavina Hamman, MD  potassium chloride SA (KLOR-CON M) 20 MEQ tablet Take 1 tablet (20 mEq total) by mouth daily. 07/21/22   Lillard Anes, MD      Allergies    Cephalosporins    Review of Systems   Review of Systems  Cardiovascular:  Negative for chest pain.  Neurological:  Positive for syncope. Negative for weakness, numbness and headaches.    Physical Exam Updated Vital Signs BP 128/77   Pulse 93   Temp 98.9 F (37.2 C) (Oral)   Resp 16   Ht '5\' 6"'  (1.676 m)   Wt 63.5 kg   SpO2 98%   BMI 22.60 kg/m  Physical Exam Vitals and nursing note reviewed.  Constitutional:      Appearance: She is well-developed.  HENT:     Head: Normocephalic and atraumatic.  Eyes:     Extraocular Movements: Extraocular movements intact.     Pupils: Pupils are equal, round, and reactive to light.  Cardiovascular:     Rate and  Rhythm: Normal rate and regular rhythm.     Heart sounds: Normal heart sounds.     Comments: ?mild murmur Pulmonary:     Effort: Pulmonary effort is normal.     Breath sounds: Normal breath sounds.  Abdominal:     General: There is no distension.     Palpations: Abdomen is soft.     Tenderness: There is no abdominal tenderness.  Skin:    General: Skin is warm and dry.  Neurological:     Mental Status: She is alert.     Comments: No facial droop, slurred speech. 5/5 strength in both upper extremities. Both legs are weak, though the right is a little weaker than left (baseline per daughter).     ED Results / Procedures / Treatments   Labs (all labs  ordered are listed, but only abnormal results are displayed) Labs Reviewed  CBC WITH DIFFERENTIAL/PLATELET - Abnormal; Notable for the following components:      Result Value   WBC 12.5 (*)    Hemoglobin 11.9 (*)    RDW 16.0 (*)    Neutro Abs 7.8 (*)    Basophils Absolute 0.2 (*)    Abs Immature Granulocytes 0.76 (*)    All other components within normal limits  BASIC METABOLIC PANEL - Abnormal; Notable for the following components:   Potassium 3.1 (*)    Chloride 112 (*)    Glucose, Bld 123 (*)    BUN 35 (*)    Calcium 8.6 (*)    All other components within normal limits  URINALYSIS, ROUTINE W REFLEX MICROSCOPIC - Abnormal; Notable for the following components:   Specific Gravity, Urine 1.038 (*)    Leukocytes,Ua TRACE (*)    Bacteria, UA RARE (*)    All other components within normal limits  MAGNESIUM    EKG EKG Interpretation  Date/Time:  Wednesday August 31 2022 17:39:23 EDT Ventricular Rate:  97 PR Interval:  152 QRS Duration: 144 QT Interval:  407 QTC Calculation: 517 R Axis:   -10 Text Interpretation: Sinus rhythm Probable left atrial enlargement Left bundle branch block No significant change since last tracing Confirmed by Kommor, Madison ([id number]) on 08/31/2022 5:47:13 PM  Radiology CT ANGIO HEAD NECK W WO CM  Result Date: 08/31/2022 CLINICAL DATA:  TIA, intermittent episodes of near syncope; recent MRI with small subacute lacunar infarcts EXAM: CT ANGIOGRAPHY HEAD AND NECK TECHNIQUE: Multidetector CT imaging of the head and neck was performed using the standard protocol during bolus administration of intravenous contrast. Multiplanar CT image reconstructions and MIPs were obtained to evaluate the vascular anatomy. Carotid stenosis measurements (when applicable) are obtained utilizing NASCET criteria, using the distal internal carotid diameter as the denominator. RADIATION DOSE REDUCTION: This exam was performed according to the departmental dose-optimization program  which includes automated exposure control, adjustment of the mA and/or kV according to patient size and/or use of iterative reconstruction technique. CONTRAST:  26m OMNIPAQUE IOHEXOL 350 MG/ML SOLN COMPARISON:  CT head 07/20/2022, MRI head 08/18/2022, MRA 12/13/2020 FINDINGS: CT HEAD FINDINGS Brain: No evidence of acute infarct, hemorrhage, mass, mass effect, or midline shift. No hydrocephalus or extra-axial fluid collection. Basal ganglia calcifications. Redemonstrated encephalomalacia in the left parieto-occipital region, likely sequela of remote infarct. Lacunar infarcts in the right corona radiata and centrum semiovale correlate with the subacute infarcts seen on 08/18/2022. No other new hypodensity. Periventricular white matter changes, likely the sequela of chronic small vessel ischemic disease. Redemonstrated intra sellar mass with suprasellar extension, measuring up to  2.4 cm (series 10, image 34), unchanged when compared to both the prior CT head and the most recent MRI. Vascular: No hyperdense vessel. Atherosclerotic calcifications in the intracranial carotid and vertebral arteries. Skull: Normal. Negative for fracture or focal lesion. Sinuses/Orbits: Mucosal thickening in the sphenoid sinuses. Status post bilateral lens replacements. Other: The mastoid air cells are well aerated. CTA NECK FINDINGS Aortic arch: Two-vessel arch with a common origin of the brachiocephalic and left common carotid arteries. Imaged portion shows no evidence of aneurysm or dissection. Approximate 50% stenosis of the proximal left subclavian artery (series 11, image 40). No other significant stenosis of the major arch vessel origins. Right carotid system: No evidence of dissection, occlusion, or hemodynamically significant stenosis (greater than 50%). Atherosclerotic disease at the bifurcation and in the proximal and distal ICA is not hemodynamically significant. Left carotid system: No evidence of dissection, occlusion, or  hemodynamically significant stenosis (greater than 50%). Atherosclerotic disease in the distal ICA is not hemodynamically significant. Vertebral arteries: No evidence of dissection, occlusion, or hemodynamically significant stenosis (greater than 50%). Skeleton: Osteopenia.  No acute osseous abnormality. Other neck: Multinodular goiter. Upper chest: Negative. Review of the MIP images confirms the above findings CTA HEAD FINDINGS Anterior circulation: Both internal carotid arteries are patent to the termini, without significant stenosis. Patent left A1. Aplastic right A1. Normal anterior communicating artery. Anterior cerebral arteries are patent to their distal aspects. No M1 stenosis or occlusion. MCA branches perfused and symmetric. Posterior circulation: Vertebral arteries patent to the vertebrobasilar junction without stenosis. Posterior inferior cerebellar arteries patent proximally. Basilar patent to its distal aspect. Superior cerebellar arteries patent proximally. Patent left P1. Fetal origin of the right PCA from the right posterior communicating artery. PCAs perfused to their distal aspects without stenosis. The left posterior communicating artery is not definitively visualized. Venous sinuses: As permitted by contrast timing, patent. Anatomic variants: Fetal origin of the right PCA. Aplastic right A1. Review of the MIP images confirms the above findings IMPRESSION: 1.  No intracranial large vessel occlusion or significant stenosis. 2. Approximate 50% stenosis of the proximal left subclavian artery. No other hemodynamically significant stenosis in the neck. 3.  No acute intracranial process. 4. Redemonstrated sellar mass with suprasellar extension, which appears grossly similar to the prior CT head and most recent MRI. 5. Multinodular goiter. If this has not previously been evaluated, a non-emergent ultrasound of the thyroid is recommended, if clinically indicated given patient age. (Reference: J Am Coll  Radiol. 2015 Feb;12(2): 143-50) Electronically Signed   By: Merilyn Baba M.D.   On: 08/31/2022 22:00    Procedures Procedures    Medications Ordered in ED Medications  potassium chloride (KLOR-CON) packet 40 mEq (40 mEq Oral Given 08/31/22 2047)  lactated ringers bolus 500 mL (500 mLs Intravenous New Bag/Given 08/31/22 2050)  iohexol (OMNIPAQUE) 350 MG/ML injection 75 mL (75 mLs Intravenous Contrast Given 08/31/22 2118)    ED Course/ Medical Decision Making/ A&P                           Medical Decision Making Amount and/or Complexity of Data Reviewed Labs:     Details: Nonspecific leukocytosis of 12.5.  Mild hypokalemia at 3.1 BUN elevation consistent with some mild dehydration Radiology: independent interpretation performed.    Details: CTA without acute arterial occlusion ECG/medicine tests: independent interpretation performed.    Details: No arrhythmia or obvious cause of syncope.  Risk Prescription drug management. Decision regarding hospitalization.  Based on daughter's history of sound like she is having syncopal episodes, most recently this afternoon.  Chart reviewed and I have reviewed Dr. Clydene Fake note.  At this point I think it be most appropriate to bring her in for syncope work-up.  As above no emergent findings found.  ECG overall unremarkable.  Discussed with Dr. Myna Hidalgo for admission.        Final Clinical Impression(s) / ED Diagnoses Final diagnoses:  Syncope, unspecified syncope type    Rx / DC Orders ED Discharge Orders     None         Sherwood Gambler, MD 08/31/22 [phone]

## 2022-08-31 NOTE — Telephone Encounter (Signed)
Ms Apurva's personal nurse, Particia Jasper, called the office call phone during lunch hours. Reports pt has experienced intermittent syncopal episodes and hypotension over the past few days. States she had bright red rectal bleeding last week. Recommend pt be taken to the ED for further evaluation. Pt's nurse requested office staff member call her family to recommend pt be taken to the ED.

## 2022-08-31 NOTE — Progress Notes (Signed)
[facility] [address] [place]. [place] [address] [phone]       HOSPITAL FOLLOW UP NOTE  Ms. Christine Newton Date of Birth:  1933/05/02 Medical Record Number:  [id number]   Reason for Referral:  hospital stroke follow up    SUBJECTIVE:   CHIEF COMPLAINT:  Chief Complaint  Patient presents with   New Patient (Initial Visit)    Rm 12. NX Jessica LV 2022/internal referral for recent stroke on MRI.    HPI:   Ms. Christine Newton is a 88 y.o. female with history of HTN and DM who presented to [facility] ED on 12/13/2020 after syncopal event followed by confusion, disorientation and transient right-sided weakness.  Personally reviewed hospitalization pertinent progress notes, lab work and imaging with summary provided.  Stroke work-up largely unremarkable for acute stroke.  MRI did show evidence of chronic ischemic infarcts involving the left frontal and parietal lobes and scattered remote lacunar infarcts of the left centrum semiovale, bilateral basal ganglia and cerebellum.  MRA showed 40% stenosis of right ICA origin and approximately 50% stenosis of right VA.  EEG suggestive of focal left temporal slowing which may be postictal in setting of recent event and recommended initiating Keppra XR 500 mg daily for seizure prevention.  Stroke risk factors include prior strokes on imaging, HTN, HLD and DM.  LDL 127 and increase atorvastatin from 10 mg to 20 mg daily.  A1c 6.8.  Evaluated by therapies and discharged home in stable condition without therapy needs.  She is being seen today for hospital follow up accompanied by her daughter.  She has been doing well since discharge without new or reoccurring stroke/TIA symptoms.  She had great difficulty tolerating Keppra due to confusion and lethargy therefore discontinued by PCP and now currently back to baseline.  She has not had any additional seizure type activity.  She also experienced black tarry stools after taking  Plavix and aspirin for 3 weeks therefore both discontinued by PCP and he has not had any additional black tarry stools or any other signs or symptoms of bleeding.  She has remained on atorvastatin 20 mg daily without myalgias.  Blood pressure today 118/71.  Routinely monitors at home and typically 120s/70s.  Glucose levels monitored at home and typically 90s.   Update 08/31/2022 : She returns for follow-up after last visit with [facility] practitioner in January 2022.  She is accompanied by her daughter and her caregiver.  They feel she had a TIA or a stroke on 07/20/2022.  Patient was standing in the kitchen and then all of a sudden he dropped the right hand which became flaccid.  He needed to be helped down.  He is menstruating and not the caregiver because the daughter called 911.  Ambulance arrived she slowly started improving.  She remains very short time.  Recovered in about 20 minutes patient is not want to go to the heart discussed with the primary physician who ordered an outpatient MRI scan of the brain which was done on 8 /24/23 which I personally reviewed shows tiny subacute right corona radiata lacunar infarct..  Stable appearance of the 1.5 cm x 1.6 cm x 1.8 cm pituitary macroadenoma.  LDL cholesterol was 101 and hemoglobin A1c 7.0 on 07/07/2022.  Patient has a 2D echo which is pending.  Patient has not had several other episodes since then which the caregiver describes as brief episodes of unresponsiveness when she appears sleepy  weak all over.  She needs help.  to get up and slight And she recovered within few minutes.   Such  episodes within the last 1 week itself have occurred.  There are no obvious triggers for these episodes.  Patient does appear to confused disoriented and sleepy.  There is no weakness s tonic-clonic activity, tongue bite or incontinence.  Patient had a somewhat similar episode EEG at that time and showed left temporal slowing.  MRI scan of the brain at that time had shown  age-related atrophy without acute abnormalities.  Scattered remote lacunar infarcts in the left centrum semiovale, bilateral basal ganglia and cerebellum are noted.  Pituitary macroadenoma was also noted.  I had started the patient on Keppra but she did not like it due to side effects and discontinued it.  On inquiry the daughter admits to having noticed some memory difficulties weekly short-term with the patient.  She does not think this is necessarily progressing patient lives with her daughter but during the daytime she has a caregiver.      ROS:   14 system review of systems performed and negative with exception of those listed in HPI  PMH:  Past Medical History:  Diagnosis Date   Diabetes ([place])    Hypertension    Nonrheumatic aortic (valve) stenosis 09/21/2020    PSH:  Past Surgical History:  Procedure Laterality Date   CARDIAC VALVE REPLACEMENT     CHOLECYSTECTOMY      Social History:  Social History   Socioeconomic History   Marital status: Widowed    Spouse name: Not on file   Number of children: 2   Years of education: 12 + 4   Highest education level: Not on file  Occupational History   Occupation: RETIRED SOCIAL WORKER  Tobacco Use   Smoking status: Never   Smokeless tobacco: Never  Vaping Use   Vaping Use: Never used  Substance and Sexual Activity   Alcohol use: Never   Drug use: Never   Sexual activity: Not Currently  Other Topics Concern   Not on file  Social History Narrative   Not on file   Social Determinants of Health   Financial Resource Strain: Not on file  Food Insecurity: Not on file  Transportation Needs: Not on file  Physical Activity: Not on file  Stress: Not on file  Social Connections: Not on file  Intimate Partner Violence: Not on file    Family History: History reviewed. No pertinent family history.  Medications:   Current Outpatient Medications on File Prior to Visit  Medication Sig Dispense Refill   Accu-Chek Softclix  Lancets lancets 1 EACH BY DOES NOT APPLY ROUTE 3 (THREE) TIMES DAILY. 100 each 2   aspirin EC 81 MG tablet Take 1 tablet (81 mg total) by mouth daily. Swallow whole. 30 tablet 11   atorvastatin (LIPITOR) 40 MG tablet Take 1 tablet (40 mg total) by mouth daily. (Patient taking differently: Take 80 mg by mouth daily.) 90 tablet 1   Blood Glucose Monitoring Suppl (ACCU-CHEK GUIDE) w/Device KIT 1 each by Does not apply route 2 (two) times daily as needed. 1 kit 0   glucose blood (ACCU-CHEK GUIDE) test strip 1 EACH BY OTHER ROUTE 2 (TWO) TIMES DAILY AS NEEDED. USE AS INSTRUCTED 200 strip 2   Lancets Misc. (ACCU-CHEK SOFTCLIX LANCET DEV) KIT 1 each by Does not apply route 3 (three) times daily. 1 kit 0   lisinopril (ZESTRIL) 20 MG tablet Take 1 tablet (20 mg total) by mouth daily. 90 tablet 3  metFORMIN (GLUCOPHAGE) 500 MG tablet Take 1 tablet (500 mg total) by mouth daily with breakfast. Take 2 every morning and 1 every evening 90 tablet 2   polyethylene glycol (MIRALAX / GLYCOLAX) 17 g packet Take 17 g by mouth daily. 14 each 0   potassium chloride SA (KLOR-CON M) 20 MEQ tablet Take 1 tablet (20 mEq total) by mouth daily. 10 tablet 0   No current facility-administered medications on file prior to visit.    Allergies:   Allergies  Allergen Reactions   Cephalosporins Nausea Only and Other (See Comments)    Other Reaction: Allergy      OBJECTIVE:  Physical Exam  Vitals:   08/31/22 1534  BP: 112/71  Pulse: 98  Weight: 140 lb (63.5 kg)  Height: '5\' 6"'  (1.676 m)   Body mass index is 22.6 kg/m. No results found.  General: well developed, well nourished,  pleasant elderly African-American female, seated, in no evident distress Head: head normocephalic and atraumatic.   Neck: supple with no carotid or supraclavicular bruits Cardiovascular: regular rate and rhythm, no murmurs Musculoskeletal: no deformity Skin:  no rash/petichiae Vascular:  Normal pulses all extremities   Neurologic  Exam Mental Status: Awake and fully alert. Oriented to place (city) and time. Recent memory impaired and remote memory intact. Attention span, concentration appropriate and fund of knowledge impaired. Mood and affect appropriate.  Diminished recall 1/3.  Clock drawing 2/4.  Unable to copy intersecting pentagons. Cranial Nerves: Fundoscopic exam reveals sharp disc margins. Pupils equal, briskly reactive to light. Extraocular movements full without nystagmus. Visual fields full to confrontation. Hearing mildly diminished bilaterally facial sensation intact. Face, tongue, palate moves normally and symmetrically.  Motor: Normal bulk and tone. Normal strength in all tested extremity muscles are symmetric bilateral lower extremity except symmetric bilateral mild proximal lower extremity weakness Sensory.: intact to touch , pinprick , position and vibratory sensation.  Coordination: Rapid alternating movements normal in all extremities. Finger-to-nose and heel-to-shin performed accurately bilaterally. Gait and Station: Deferred as patient is in a wheelchair. Reflexes: 1+ and symmetric. Toes downgoing.           ASSESSMENT: Christine Newton is a 88 y.o. year old female presented on 12/13/2020 after syncopal event followed by confusion, disorientation and transient right-sided weakness  and recurrent one in July 2023 with MRI done 1 month later showing a subacute right corona radiata lacunar stroke which was clinically silent.  She continues to have recurrent episodes of transient consciousness which are of unclear etiology possible complex partial seizures versus vertebrobasilar TIA syncopal episodes which appear to have shown increased frequency recently.  History of abnormal EEG in the past and was tried on Keppra but was unable to tolerate it. History of pituitary macroadenoma since 2021 which has remained stable.    PLAN:  I had a long discussion with the patient, her daughter and caregiver  regarding her recurrent episodes of brief altered consciousness and discussed differential diagnosis which includes complex partial seizures versus TIAs with syncopal episodes.  Her MRI from 2 weeks ago shows silent right subcortical lacunar infarct which cannot explain her symptoms.  I recommend further evaluation by checking CT angiogram of the brain and neck, EEG, echocardiogram and 30-day cardiac monitoring.  Patient's daughter wants to expedite this work-up and wants to take her to the [facility] emergency room for possible admission and work-up.  Pain.  Prevention and maintain aggressive risk factor modification with strict control of hypertension with blood pressure goal below 130/90,  lipids with LDL cholesterol goal below 70 mg percent and diabetes with hemoglobin A1c goal below 6.5%.  May consider trial of seizure medications if there is no neurovascular etiology and EEG shows irritability if episodes are quite frequent.  Greater than 50% time during this 45-minute prolonged visit was spent on counseling and coordination of care about the history of strokes, TIAs, seizures versus syncopal episodes and discussion about evaluation and treatment and answering questions.    Antony Contras, MD [facility] [address] [place], [place] [address]  Phone [phone] Fax [phone] Note: This document was prepared with digital dictation and possible smart phrase technology. Any transcriptional errors that result from this process are unintentional.

## 2022-08-31 NOTE — ED Notes (Signed)
Needs IV

## 2022-08-31 NOTE — Patient Instructions (Signed)
I had a long discussion with the patient, her daughter and caregiver regarding her recurrent episodes of brief altered consciousness and discussed differential diagnosis which includes complex partial seizures versus TIAs with syncopal episodes.  Her MRI from 2 weeks ago shows silent right subcortical lacunar infarct which cannot explain her symptoms.  I recommend further evaluation by checking CT angiogram of the brain and neck, EEG, echocardiogram and 30-day cardiac monitoring.  Patient's daughter wants to expedite this work-up and wants to take her to the [facility] emergency room for possible admission and work-up.  Pain.  Prevention and maintain aggressive risk factor modification with strict control of hypertension with blood pressure goal below 130/90, lipids with LDL cholesterol goal below 70 mg percent and diabetes with hemoglobin A1c goal below 6.5%.  May consider trial of the medications if there is no neurovascular etiology and EEG shows irritability if episodes are quite frequent.

## 2022-08-31 NOTE — ED Notes (Signed)
Pt needs 20g above Forearm for CTA please

## 2022-09-01 ENCOUNTER — Observation Stay (HOSPITAL_BASED_OUTPATIENT_CLINIC_OR_DEPARTMENT_OTHER): Payer: Medicare PPO

## 2022-09-01 ENCOUNTER — Observation Stay (HOSPITAL_COMMUNITY)
Admit: 2022-09-01 | Discharge: 2022-09-01 | Disposition: A | Discharge status: Home / Self Care | Payer: Medicare PPO | Attending: Family Medicine | Admitting: Family Medicine

## 2022-09-01 DIAGNOSIS — R55 Syncope and collapse: Secondary | ICD-10-CM

## 2022-09-01 LAB — BASIC METABOLIC PANEL
Anion gap: 7 (ref 5–15)
BUN: 26 mg/dL — ABNORMAL HIGH (ref 8–23)
CO2: 27 mmol/L (ref 22–32)
Calcium: 8.2 mg/dL — ABNORMAL LOW (ref 8.9–10.3)
Chloride: 113 mmol/L — ABNORMAL HIGH (ref 98–111)
Creatinine, Ser: 0.87 mg/dL (ref 0.44–1.00)
GFR, Estimated: >60 mL/min (ref >60)
Glucose, Bld: 131 mg/dL — ABNORMAL HIGH (ref 70–99)
Potassium: 4.2 mmol/L (ref 3.5–5.1)
Sodium: 147 mmol/L — ABNORMAL HIGH (ref 135–145)

## 2022-09-01 LAB — ECHOCARDIOGRAM COMPLETE
AR max vel: 1.88 cm2
AV Area VTI: 1.89 cm2
AV Area mean vel: 1.92 cm2
AV Mean grad: 18.3 mmHg
AV Peak grad: 36.1 mmHg
Ao pk vel: 3 m/s
Area-P 1/2: 5.38 cm2
Height: 66 in
S' Lateral: 2.1 cm
Weight: 2347.2 oz

## 2022-09-01 LAB — CBC
HCT: 36.3 % (ref 36.0–46.0)
Hemoglobin: 11.2 g/dL — ABNORMAL LOW (ref 12.0–15.0)
MCH: 28.6 pg (ref 26.0–34.0)
MCHC: 30.9 g/dL (ref 30.0–36.0)
MCV: 92.8 fL (ref 80.0–100.0)
Platelets: 152 10*3/uL (ref 150–400)
RBC: 3.91 MIL/uL (ref 3.87–5.11)
RDW: 16.1 % — ABNORMAL HIGH (ref 11.5–15.5)
WBC: 12.3 10*3/uL — ABNORMAL HIGH (ref 4.0–10.5)
nRBC: 0 % (ref 0.0–0.2)

## 2022-09-01 LAB — GLUCOSE, CAPILLARY
Glucose-Capillary: 120 mg/dL — ABNORMAL HIGH (ref 70–99)
Glucose-Capillary: 124 mg/dL — ABNORMAL HIGH (ref 70–99)
Glucose-Capillary: 134 mg/dL — ABNORMAL HIGH (ref 70–99)
Glucose-Capillary: 144 mg/dL — ABNORMAL HIGH (ref 70–99)
Glucose-Capillary: 168 mg/dL — ABNORMAL HIGH (ref 70–99)

## 2022-09-01 LAB — MAGNESIUM: Magnesium: 2.3 mg/dL (ref 1.7–2.4)

## 2022-09-01 NOTE — Procedures (Signed)
Patient Name: Christine Newton  MRN: [id number]  Epilepsy Attending: Lora Havens  Referring Physician/Provider: Vianne Bulls, MD Date: 09/01/2022 Duration: 22.04 mins  Patient history: 88yo F with syncope. EEG to evaluate for seizure.  Level of alertness: Awake  AEDs during EEG study: None  Technical aspects: This EEG study was done with scalp electrodes positioned according to the 10-20 International system of electrode placement. Electrical activity was reviewed with band pass filter of 1-'70Hz'$ , sensitivity of 7 uV/mm, display speed of 56m/sec with a '60Hz'$  notched filter applied as appropriate. EEG data were recorded continuously and digitally stored.  Video monitoring was available and reviewed as appropriate.  Description: The posterior dominant rhythm consists of 8 Hz activity of moderate voltage (25-35 uV) seen predominantly in posterior head regions, symmetric and reactive to eye opening and eye closing. Hyperventilation and photic stimulation were not performed.     EKG artifact was seen during the study.   IMPRESSION: This study is within normal limits. No seizures or epileptiform discharges were seen throughout the recording.  A normal interictal EEG does not exclude nor support the diagnosis of epilepsy.   Christine Newton OBarbra Sarks

## 2022-09-01 NOTE — Progress Notes (Signed)
EEG complete - results pending 

## 2022-09-01 NOTE — Progress Notes (Signed)
OT Cancellation Note  Patient Details Name: Christine Newton MRN: [id number] DOB: 1933/03/26   Cancelled Treatment:    Reason Eval/Treat Not Completed: Patient at procedure or test/ unavailable. Patient getting EEG.  Nadim Malia L Tyr Franca 09/01/2022, 3:21 PM

## 2022-09-01 NOTE — Hospital Course (Addendum)
88 y.o. f w/ medical history significant for hypertension, type 2 diabetes mellitus, and dementia, now presenting to the ED for evaluation of unresponsive episodes.  per report-Patient's daughter witnessed an episode in December where the patient slumped over in her chair and was not responding for approximately a minute before returning to her usual state.  She has gone on to have several similar episodes since then with increased frequency over the past couple weeks.  The patient's caregiver has witnessed these episodes and described the patient slumping over in her chair and not responding momentarily before regaining awareness.She seems to return to her usual state within a minute or so.  There is no shaking observed with these episodes.  Patient is incontinent of urine at baseline.  She has been undergoing work-up in the outpatient setting for this and had recent MRI brain that was negative for acute findings and EEG notable for left temporal slowing.  She had been started on Keppra but developed diarrhea and stopped it.  She saw her neurologist again 08/31/22 in follow-up and it was advised that she present to the emergency department for expedited work-up to include CTA of the head and neck, echocardiogram, EEG, and 30-day monitor.In the ED, afebrile and saturating well on room air with systolic blood pressure 938 and greater.  EKG features sinus rhythm with chronic LBBB.

## 2022-09-01 NOTE — Progress Notes (Signed)
PT Cancellation Note  Patient Details Name: Christine Newton MRN: [id number] DOB: 09-18-1933   Cancelled Treatment:    Reason Eval/Treat Not Completed: Patient at procedure or test/unavailable, EEG.  [place] Office [phone] Weekend [phone]    Claretha Cooper 09/01/2022, 3:21 PM

## 2022-09-01 NOTE — Progress Notes (Signed)
PROGRESS NOTE Christine Newton  XHB:[id number] DOB: 01/12/1933 DOA: 08/31/2022 PCP: Lillard Anes, MD   Brief Narrative/Hospital Course: 88 y.o. f w/ medical history significant for hypertension, type 2 diabetes mellitus, and dementia, now presenting to the ED for evaluation of unresponsive episodes.  per report-Patient's daughter witnessed an episode in December where the patient slumped over in her chair and was not responding for approximately a minute before returning to her usual state.  She has gone on to have several similar episodes since then with increased frequency over the past couple weeks.  The patient's caregiver has witnessed these episodes and described the patient slumping over in her chair and not responding momentarily before regaining awareness.She seems to return to her usual state within a minute or so.  There is no shaking observed with these episodes.  Patient is incontinent of urine at baseline.  She has been undergoing work-up in the outpatient setting for this and had recent MRI brain that was negative for acute findings and EEG notable for left temporal slowing.  She had been started on Keppra but developed diarrhea and stopped it.  She saw her neurologist again 08/31/22 in follow-up and it was advised that she present to the emergency department for expedited work-up to include CTA of the head and neck, echocardiogram, EEG, and 30-day monitor.In the ED, afebrile and saturating well on room air with systolic blood pressure 810 and greater.  EKG features sinus rhythm with chronic LBBB.    Subjective: Seen and examined this morning, resting comfortably pleasant, no new complaints, daughter at the bedside.  Assessment and Plan: Principal Problem:   Syncope Active Problems:   Essential hypertension   Dementia (HCC)   Non-insulin dependent type 2 diabetes mellitus (HCC)   Hypokalemia   ?Syncope, recurrent: unclear etiology, syncope arrived at [facility] on the  chair.so far CTA of the head and neck is negative for acute findings.  Orthostatics vitals are negative.  Echocardiogram with EF 55 to 60%, G1 DD, RV systolic function normal, mitral valve normal, aortic valve repaired/replaced-Carpentier's edward bioprosthetic valve.  EEG pending Patient has an outpatient work-up including MRI neurology evaluation see above.  Sent message to Gay Filler for setting up outpatient cardiac monitor.  Obtain PT OT evaluation.  Hypertension controlled on lisinopril Type 2 diabetes mellitus A1c 7.0 recently, blood sugar controlled on SSI Dementia, at baseline mentation continue delirium precaution pulm precaution and supportive care Hypokalemia replaced Multinodular goiter noted on CT in ED will need outpatient follow-up-daughter aware. Mild hyponatremia monitor Mild leukocytosis unclear etiology WBC 6-10 pyuria, recheck UA  DVT prophylaxis: enoxaparin (LOVENOX) injection 40 mg Start: 09/01/22 1000 Code Status:   Code Status: DNR Family Communication: plan of care discussed with patient/daughter at bedside. Patient status is: Admitted as observation for work-up of syncope.  Remains hospitalized for ongoing work-up  level of care: Telemetry  Dispo: The patient is from: Home            Anticipated disposition: Home once syncope work-up completed  Mobility Assessment (last 72 hours)     Mobility Assessment     Row Name 09/01/22 0200           Does patient have an order for bedrest or is patient medically unstable Yes- Bedfast (Level 1) - Complete                 Objective: Vitals last 24 hrs: Vitals:   09/01/22 1050 09/01/22 1056 09/01/22 1059 09/01/22 1211  BP: (!) 157/85 Marland Kitchen)  156/119 (!) 163/83 (!) 153/73  Pulse: 85 (!) 108 92 78  Resp:    18  Temp:    98 F (36.7 C)  TempSrc:    Oral  SpO2: 96% 99% 100% 97%  Weight:      Height:       Weight change:   Physical Examination: General exam: alert awake,older than stated age, weak  appearing. HEENT:Oral mucosa moist, Ear/Nose WNL grossly, dentition normal. Respiratory system: bilaterally diminished BS, no use of accessory muscle Cardiovascular system: S1 & S2 +, No JVD. Gastrointestinal system: Abdomen soft,NT,ND, BS+ Nervous System:Alert, awake, moving extremities and grossly nonfocal Extremities: LE edema neg,distal peripheral pulses palpable.  Skin: No rashes,no icterus. MSK: Normal muscle bulk,tone, power  Medications reviewed:  Scheduled Meds:  aspirin EC  81 mg Oral Daily   atorvastatin  80 mg Oral Daily   enoxaparin (LOVENOX) injection  40 mg Subcutaneous Q24H   insulin aspart  0-5 Units Subcutaneous QHS   insulin aspart  0-6 Units Subcutaneous TID WC   lisinopril  20 mg Oral Daily   sodium chloride flush  3 mL Intravenous Q12H   Continuous Infusions:    Diet Order             Diet regular Room service appropriate? Yes; Fluid consistency: Thin  Diet effective now                            Intake/Output Summary (Last 24 hours) at 09/01/2022 1540 Last data filed at 09/01/2022 1001 Gross per 24 hour  Intake 240 ml  Output 500 ml  Net -260 ml   Net IO Since Admission: -260 mL [09/01/22 1540]  Wt Readings from Last 3 Encounters:  09/01/22 66.5 kg  08/31/22 63.5 kg  08/04/22 67.1 kg     Unresulted Labs (From admission, onward)     Start     Ordered   09/07/22 0500  Creatinine, serum  (enoxaparin (LOVENOX)    CrCl >/= 30 ml/min)  Weekly,   R     Comments: while on enoxaparin therapy    08/31/22 2313          Data Reviewed: I have personally reviewed following labs and imaging studies CBC: Recent Labs  Lab 08/31/22 1729 09/01/22 0713  WBC 12.5* 12.3*  NEUTROABS 7.8*  --   HGB 11.9* 11.2*  HCT 38.1 36.3  MCV 92.0 92.8  PLT 188 454   Basic Metabolic Panel: Recent Labs  Lab 08/31/22 1729 09/01/22 0713  NA 145 147*  K 3.1* 4.2  CL 112* 113*  CO2 23 27  GLUCOSE 123* 131*  BUN 35* 26*  CREATININE 0.91 0.87   CALCIUM 8.6* 8.2*  MG 2.1 2.3   GFR: Estimated Creatinine Clearance: 42.6 mL/min (by C-G formula based on SCr of 0.87 mg/dL). Liver Function Tests: No results for input(s): "AST", "ALT", "ALKPHOS", "BILITOT", "PROT", "ALBUMIN" in the last 168 hours. No results for input(s): "LIPASE", "AMYLASE" in the last 168 hours. No results for input(s): "AMMONIA" in the last 168 hours. Coagulation Profile: No results for input(s): "INR", "PROTIME" in the last 168 hours. BNP (last 3 results) No results for input(s): "PROBNP" in the last 8760 hours. HbA1C: No results for input(s): "HGBA1C" in the last 72 hours. CBG: Recent Labs  Lab 09/01/22 0025 09/01/22 0802 09/01/22 1215  GLUCAP 144* 120* 134*   Lipid Profile: No results for input(s): "CHOL", "HDL", "LDLCALC", "TRIG", "CHOLHDL", "LDLDIRECT" in the  last 72 hours. Thyroid Function Tests: No results for input(s): "TSH", "T4TOTAL", "FREET4", "T3FREE", "THYROIDAB" in the last 72 hours. Sepsis Labs: No results for input(s): "PROCALCITON", "LATICACIDVEN" in the last 168 hours.  No results found for this or any previous visit (from the past 240 hour(s)).  Antimicrobials: Anti-infectives (From admission, onward)    None      Culture/Microbiology    Component Value Date/Time   SDES URINE, RANDOM 12/13/2020 1618   SPECREQUEST  12/13/2020 1618    NONE Performed at [facility], [place] [address] [place], [place] [address]    CULT >=100,000 COLONIES/mL ESCHERICHIA COLI (A) 12/13/2020 1618   REPTSTATUS 12/16/2020 FINAL 12/13/2020 1618  Other culture-see note  Radiology Studies: ECHOCARDIOGRAM COMPLETE  Result Date: 09/01/2022    ECHOCARDIOGRAM REPORT   Patient Name:   Christine Newton Date of Exam: 09/01/2022 Medical Rec #:  [id number]           Height:       66.0 in Accession #:    [id number]          Weight:       146.7 lb Date of Birth:  1933/06/04           BSA:          1.753 m Patient Age:    71 years            BP:            163/83 mmHg Patient Gender: F                   HR:           77 bpm. Exam Location:  Inpatient Procedure: 2D Echo, Cardiac Doppler and Color Doppler Indications:    Syncope  History:        Patient has prior history of Echocardiogram examinations, most                 recent 12/14/2020. Risk Factors:Hypertension and Diabetes.                 Aortic Valve: 21 mm Carpentier-Edwards bioprosthetic valve is                 present in the aortic position.  Sonographer:    Jefferey Pica Referring Phys: [id number] [place]  1. S/P AVR with mean gradient 18 mmHg and AVA 1.9 cm2.  2. Left ventricular ejection fraction, by estimation, is 55 to 60%. The left ventricle has normal function. The left ventricle has no regional wall motion abnormalities. There is moderate left ventricular hypertrophy. Left ventricular diastolic parameters are consistent with Grade I diastolic dysfunction (impaired relaxation).  3. Right ventricular systolic function is normal. The right ventricular size is normal.  4. The mitral valve is normal in structure. Trivial mitral valve regurgitation. No evidence of mitral stenosis. Moderate mitral annular calcification.  5. The aortic valve has been repaired/replaced. Aortic valve regurgitation is trivial. No aortic stenosis is present. There is a 21 mm Carpentier-Edwards bioprosthetic valve present in the aortic position.  6. The inferior vena cava is normal in size with greater than 50% respiratory variability, suggesting right atrial pressure of 3 mmHg. Comparison(s): No significant change from prior study. FINDINGS  Left Ventricle: Left ventricular ejection fraction, by estimation, is 55 to 60%. The left ventricle has normal function. The left ventricle has no regional wall motion abnormalities. The left ventricular internal cavity size was normal in size.  There is  moderate left ventricular hypertrophy. Abnormal (paradoxical) septal motion, consistent with left bundle branch block.  Left ventricular diastolic parameters are consistent with Grade I diastolic dysfunction (impaired relaxation). Right Ventricle: The right ventricular size is normal. Right ventricular systolic function is normal. Left Atrium: Left atrial size was normal in size. Right Atrium: Right atrial size was normal in size. Pericardium: There is no evidence of pericardial effusion. Mitral Valve: The mitral valve is normal in structure. Moderate mitral annular calcification. Trivial mitral valve regurgitation. No evidence of mitral valve stenosis. Tricuspid Valve: The tricuspid valve is normal in structure. Tricuspid valve regurgitation is trivial. No evidence of tricuspid stenosis. Aortic Valve: The aortic valve has been repaired/replaced. Aortic valve regurgitation is trivial. No aortic stenosis is present. Aortic valve mean gradient measures 18.3 mmHg. Aortic valve peak gradient measures 36.1 mmHg. Aortic valve area, by VTI measures 1.89 cm. There is a 21 mm Carpentier-Edwards bioprosthetic valve present in the aortic position. Pulmonic Valve: The pulmonic valve was not well visualized. Pulmonic valve regurgitation is not visualized. No evidence of pulmonic stenosis. Aorta: The aortic root is normal in size and structure. Venous: The inferior vena cava is normal in size with greater than 50% respiratory variability, suggesting right atrial pressure of 3 mmHg. IAS/Shunts: No atrial level shunt detected by color flow Doppler. Additional Comments: S/P AVR with mean gradient 18 mmHg and AVA 1.9 cm2.  LEFT VENTRICLE PLAX 2D LVIDd:         2.80 cm LVIDs:         2.10 cm LV PW:         1.30 cm LV IVS:        1.40 cm LVOT diam:     1.90 cm LV SV:         97 LV SV Index:   55 LVOT Area:     2.84 cm  RIGHT VENTRICLE             IVC RV Basal diam:  2.50 cm     IVC diam: 1.40 cm RV S prime:     10.00 cm/s TAPSE (M-mode): 1.6 cm LEFT ATRIUM             Index        RIGHT ATRIUM           Index LA diam:        4.00 cm 2.28 cm/m   RA  Area:     12.70 cm LA Vol (A2C):   60.8 ml 34.68 ml/m  RA Volume:   25.10 ml  14.32 ml/m LA Vol (A4C):   34.5 ml 19.68 ml/m LA Biplane Vol: 46.9 ml 26.75 ml/m  AORTIC VALVE                     PULMONIC VALVE AV Area (Vmax):    1.88 cm      PV Vmax:       0.88 m/s AV Area (Vmean):   1.92 cm      PV Peak grad:  3.1 mmHg AV Area (VTI):     1.89 cm AV Vmax:           300.33 cm/s AV Vmean:          196.667 cm/s AV VTI:            0.516 m AV Peak Grad:      36.1 mmHg AV Mean Grad:      18.3 mmHg LVOT Vmax:  199.00 cm/s LVOT Vmean:        133.000 cm/s LVOT VTI:          0.343 m LVOT/AV VTI ratio: 0.67  AORTA Ao Root diam: 2.70 cm Ao Asc diam:  3.60 cm MITRAL VALVE MV Area (PHT): 5.38 cm     SHUNTS MV Decel Time: 141 msec     Systemic VTI:  0.34 m MV E velocity: 75.30 cm/s   Systemic Diam: 1.90 cm MV A velocity: 159.00 cm/s MV E/A ratio:  0.47 Kirk Ruths MD Electronically signed by Kirk Ruths MD Signature Date/Time: 09/01/2022/12:25:09 PM    Final    CT ANGIO HEAD NECK W WO CM  Result Date: 08/31/2022 CLINICAL DATA:  TIA, intermittent episodes of near syncope; recent MRI with small subacute lacunar infarcts EXAM: CT ANGIOGRAPHY HEAD AND NECK TECHNIQUE: Multidetector CT imaging of the head and neck was performed using the standard protocol during bolus administration of intravenous contrast. Multiplanar CT image reconstructions and MIPs were obtained to evaluate the vascular anatomy. Carotid stenosis measurements (when applicable) are obtained utilizing NASCET criteria, using the distal internal carotid diameter as the denominator. RADIATION DOSE REDUCTION: This exam was performed according to the departmental dose-optimization program which includes automated exposure control, adjustment of the mA and/or kV according to patient size and/or use of iterative reconstruction technique. CONTRAST:  88m OMNIPAQUE IOHEXOL 350 MG/ML SOLN COMPARISON:  CT head 07/20/2022, MRI head 08/18/2022, MRA 12/13/2020  FINDINGS: CT HEAD FINDINGS Brain: No evidence of acute infarct, hemorrhage, mass, mass effect, or midline shift. No hydrocephalus or extra-axial fluid collection. Basal ganglia calcifications. Redemonstrated encephalomalacia in the left parieto-occipital region, likely sequela of remote infarct. Lacunar infarcts in the right corona radiata and centrum semiovale correlate with the subacute infarcts seen on 08/18/2022. No other new hypodensity. Periventricular white matter changes, likely the sequela of chronic small vessel ischemic disease. Redemonstrated intra sellar mass with suprasellar extension, measuring up to 2.4 cm (series 10, image 34), unchanged when compared to both the prior CT head and the most recent MRI. Vascular: No hyperdense vessel. Atherosclerotic calcifications in the intracranial carotid and vertebral arteries. Skull: Normal. Negative for fracture or focal lesion. Sinuses/Orbits: Mucosal thickening in the sphenoid sinuses. Status post bilateral lens replacements. Other: The mastoid air cells are well aerated. CTA NECK FINDINGS Aortic arch: Two-vessel arch with a common origin of the brachiocephalic and left common carotid arteries. Imaged portion shows no evidence of aneurysm or dissection. Approximate 50% stenosis of the proximal left subclavian artery (series 11, image 40). No other significant stenosis of the major arch vessel origins. Right carotid system: No evidence of dissection, occlusion, or hemodynamically significant stenosis (greater than 50%). Atherosclerotic disease at the bifurcation and in the proximal and distal ICA is not hemodynamically significant. Left carotid system: No evidence of dissection, occlusion, or hemodynamically significant stenosis (greater than 50%). Atherosclerotic disease in the distal ICA is not hemodynamically significant. Vertebral arteries: No evidence of dissection, occlusion, or hemodynamically significant stenosis (greater than 50%). Skeleton:  Osteopenia.  No acute osseous abnormality. Other neck: Multinodular goiter. Upper chest: Negative. Review of the MIP images confirms the above findings CTA HEAD FINDINGS Anterior circulation: Both internal carotid arteries are patent to the termini, without significant stenosis. Patent left A1. Aplastic right A1. Normal anterior communicating artery. Anterior cerebral arteries are patent to their distal aspects. No M1 stenosis or occlusion. MCA branches perfused and symmetric. Posterior circulation: Vertebral arteries patent to the vertebrobasilar junction without stenosis. Posterior inferior cerebellar arteries patent  proximally. Basilar patent to its distal aspect. Superior cerebellar arteries patent proximally. Patent left P1. Fetal origin of the right PCA from the right posterior communicating artery. PCAs perfused to their distal aspects without stenosis. The left posterior communicating artery is not definitively visualized. Venous sinuses: As permitted by contrast timing, patent. Anatomic variants: Fetal origin of the right PCA. Aplastic right A1. Review of the MIP images confirms the above findings IMPRESSION: 1.  No intracranial large vessel occlusion or significant stenosis. 2. Approximate 50% stenosis of the proximal left subclavian artery. No other hemodynamically significant stenosis in the neck. 3.  No acute intracranial process. 4. Redemonstrated sellar mass with suprasellar extension, which appears grossly similar to the prior CT head and most recent MRI. 5. Multinodular goiter. If this has not previously been evaluated, a non-emergent ultrasound of the thyroid is recommended, if clinically indicated given patient age. (Reference: J Am Coll Radiol. 2015 Feb;12(2): 143-50) Electronically Signed   By: Merilyn Baba M.D.   On: 08/31/2022 22:00     LOS: 0 days   Antonieta Pert, MD [facility]  09/01/2022, 3:40 PM

## 2022-09-01 NOTE — TOC Initial Note (Signed)
Transition of Care [facility]) - Initial/Assessment Note    Patient Details  Name: Christine Newton MRN: [id number] Date of Birth: Nov 02, 1933  Transition of Care [facility]) CM/SW Contact:    Leeroy Cha, RN Phone Number: 09/01/2022, 7:49 AM  Clinical Narrative:                  Transition of Care [facility]) Screening Note   Patient Details  Name: Christine Newton Date of Birth: 1933-07-26   Transition of Care [facility]) CM/SW Contact:    Leeroy Cha, RN Phone Number: 09/01/2022, 7:49 AM    Transition of Care Department [facility]) has reviewed patient and no TOC needs have been identified at this time. We will continue to monitor patient advancement through interdisciplinary progression rounds. If new patient transition needs arise, please place a TOC consult.    Expected Discharge Plan: Home/Self Care Barriers to Discharge: Continued Medical Work up   Patient Goals and CMS Choice Patient states their goals for this hospitalization and ongoing recovery are:: to return to my home CMS Medicare.gov Compare Post Acute Care list provided to:: Patient    Expected Discharge Plan and Services Expected Discharge Plan: Home/Self Care   Discharge Planning Services: CM Consult   Living arrangements for the past 2 months: Apartment                                      Prior Living Arrangements/Services Living arrangements for the past 2 months: Apartment Lives with:: Self Patient language and need for interpreter reviewed:: Yes Do you feel safe going back to the place where you live?: Yes            Criminal Activity/Legal Involvement Pertinent to Current Situation/Hospitalization: No - Comment as needed  Activities of Daily Living Home Assistive Devices/Equipment: Wheelchair, Environmental consultant (specify type) ADL Screening (condition at time of admission) Patient's cognitive ability adequate to safely complete daily activities?: Yes Is the patient deaf or have difficulty  hearing?: No Does the patient have difficulty seeing, even when wearing glasses/contacts?: No Does the patient have difficulty concentrating, remembering, or making decisions?: No Patient able to express need for assistance with ADLs?: Yes Does the patient have difficulty dressing or bathing?: Yes Independently performs ADLs?: No Communication: Independent Dressing (OT): Needs assistance Is this a change from baseline?: Pre-admission baseline Grooming: Needs assistance Is this a change from baseline?: Pre-admission baseline Feeding: Needs assistance Is this a change from baseline?: Pre-admission baseline Bathing: Dependent Is this a change from baseline?: Pre-admission baseline Toileting: Dependent Is this a change from baseline?: Pre-admission baseline In/Out Bed: Needs assistance Is this a change from baseline?: Pre-admission baseline Walks in Home: Needs assistance Is this a change from baseline?: Pre-admission baseline Does the patient have difficulty walking or climbing stairs?: Yes Weakness of Legs: Both Weakness of Arms/Hands: Both  Permission Sought/Granted                  Emotional Assessment Appearance:: Appears stated age Attitude/Demeanor/Rapport: Engaged Affect (typically observed): Calm Orientation: : Oriented to Self, Oriented to Place, Oriented to  Time, Oriented to Situation Alcohol / Substance Use: Never Used Psych Involvement: No (comment)  Admission diagnosis:  Syncope, unspecified syncope type [R55] Transient LOC (loss of consciousness) [R55] Patient Active Problem List   Diagnosis Date Noted   Syncope 08/31/2022   Non-insulin dependent type 2 diabetes mellitus ([place]) 08/31/2022   Hypokalemia 08/31/2022  Left-shifted white blood cells 08/04/2022   BMI 23.0-23.9, adult 07/20/2022   Seizure-like activity ([place]) 03/01/2022   Closed fracture of left inferior and superior pubic ramus ([place]) 01/19/2022   Dementia ([place]) 11/01/2021   Mass in region of  sella turcica present on magnetic resonance imaging 12/21/2020   Thyroid cyst 12/14/2020   TIA (transient ischemic attack) 12/13/2020   Nonrheumatic aortic (valve) stenosis 09/21/2020   Obesity, diabetes, and hypertension syndrome ([place]) 09/17/2020   S/P AVR (aortic valve replacement) 07/13/2018   Hyperlipidemia 12/20/2013   Adenoma of pituitary ([place]) 11/04/2013   Essential hypertension 11/04/2013   History of colon cancer 11/04/2013   PCP:  Lillard Anes, MD Pharmacy:   [id number] [address] [id number]: [phone]:: [phone]    Social Determinants of Health (SDOH) Interventions    Readmission Risk Interventions   No data to display

## 2022-09-02 ENCOUNTER — Other Ambulatory Visit: Payer: Self-pay | Admitting: Cardiology

## 2022-09-02 DIAGNOSIS — R55 Syncope and collapse: Secondary | ICD-10-CM | POA: Diagnosis not present

## 2022-09-02 LAB — GLUCOSE, CAPILLARY
Glucose-Capillary: 125 mg/dL — ABNORMAL HIGH (ref 70–99)
Glucose-Capillary: 155 mg/dL — ABNORMAL HIGH (ref 70–99)

## 2022-09-02 MED ORDER — SODIUM CHLORIDE 0.9 % IV BOLUS
500.0000 mL | Freq: Once | INTRAVENOUS | Status: AC
  Administered 2022-09-02: 500 mL via INTRAVENOUS

## 2022-09-02 NOTE — Discharge Summary (Signed)
Physician Discharge Summary  Christine Newton RCV:[id number] DOB: 30-Apr-1933 DOA: 08/31/2022  PCP: Lillard Anes, MD  Admit date: 08/31/2022 Discharge date: 09/02/2022 Recommendations for Outpatient Follow-up:  Follow up with PCP/neurology in 1 week-call for appointment Please obtain BMP/CBC in one week  Discharge Dispo: home Discharge Condition: Stable Code Status:   Code Status: DNR Diet recommendation:  Diet Order             Diet regular Room service appropriate? Yes; Fluid consistency: Thin  Diet effective now                    Brief/Interim Summary: 88 y.o. f w/ medical history significant for hypertension, type 2 diabetes mellitus, and dementia, now presenting to the ED for evaluation of unresponsive episodes.  per report-Patient's daughter witnessed an episode in December where the patient slumped over in her chair and was not responding for approximately a minute before returning to her usual state.  She has gone on to have several similar episodes since then with increased frequency over the past couple weeks.  The patient's caregiver has witnessed these episodes and described the patient slumping over in her chair and not responding momentarily before regaining awareness.She seems to return to her usual state within a minute or so.  There is no shaking observed with these episodes.  Patient is incontinent of urine at baseline.  She has been undergoing work-up in the outpatient setting for this and had recent MRI brain that was negative for acute findings and EEG notable for left temporal slowing.  She had been started on Keppra but developed diarrhea and stopped it.  She saw her neurologist again 08/31/22 in follow-up and it was advised that she present to the emergency department for expedited work-up to include CTA of the head and neck, echocardiogram, EEG, and 30-day monitor.In the ED, afebrile and saturating well on room air with systolic blood pressure 102 and greater.   EKG features sinus rhythm with chronic LBBB.  Her presentation of syncope is of unclear etiology,so far CTA of the head and neck is negative for acute findings.  Orthostatics vitals are negative.  Echocardiogram with EF 55 to 60%, G1 DD, RV systolic function normal, mitral valve normal, aortic valve repaired/replaced-Carpentier's edward bioprosthetic valve.  EEG-no acute finding.  No recurrence of episode.  Plan is to continue with cardiac monitor at home message sent to [facility] and cardiac monitor is being delivered to home Patient will be discharged after PT OT evaluation  Discharge Diagnoses:  Principal Problem:   Syncope Active Problems:   Essential hypertension   Dementia ([place])   Non-insulin dependent type 2 diabetes mellitus ([place])   Hypokalemia  ?Syncope, recurrent:Her presentation of syncope is of unclear etiology,so far CTA of the head and neck is negative for acute findings.  Orthostatics vitals are negative.  Echocardiogram with EF 55 to 60%, G1 DD, RV systolic function normal, mitral valve normal, aortic valve repaired/replaced-Carpentier's edward bioprosthetic valve.  EEG-no acute finding.  No recurrence of episode.  She was orthostatic with OT however negative orthostatic with PT this morning although tachycardic with ambulation, giving bolus fluids to see if it helps.Plan is to continue with cardiac monitor at home message sent to [facility] and cardiac monitor is being delivered to home. Was transiently orthostatic for OT eval but did well with PT- although sinus tachy in 160s with ambulation- given 500 cc bolus ivf. HR in 130 on ambulation but asymptomatic an d did well  at rest. Patient and daughter feels good about going home Patient will be discharged after PT OT evaluation. Advised adequate hydration.   Hypertension controlled on lisinopril Type 2 diabetes mellitus A1c 7.0 recently, blood sugar controlled on SSI Dementia, at baseline mentation continue delirium  precaution pulm precaution and supportive care Hypokalemia replaced Multinodular goiter noted on CT in ED will need outpatient follow-up-daughter aware. Mild hyponatremia monitor Mild leukocytosis unclear etiology WBC 6-10 pyuria, recheck UA  Consults: PTOT Subjective: Alert awake oriented at baseline, pleasant.  Daughter at the bedside.  Discharge Exam: Vitals:   09/02/22 1100 09/02/22 1311  BP:  (!) 153/81  Pulse: (!) 158 86  Resp:  16  Temp:    SpO2:  100%   General: Pt is alert, awake, not in acute distress Cardiovascular: RRR, S1/S2 +, no rubs, no gallops Respiratory: CTA bilaterally, no wheezing, no rhonchi Abdominal: Soft, NT, ND, bowel sounds + Extremities: no edema, no cyanosis  Discharge Instructions  Discharge Instructions     Discharge instructions   Complete by: As directed    Please call call MD or return to ER for similar or worsening recurring problem that brought you to hospital or if any fever,nausea/vomiting,abdominal pain, uncontrolled pain, chest pain,  shortness of breath or any other alarming symptoms.  Please drink enough liquid at home  you will need a cardiac monitor placed  Please follow-up your doctor as instructed in a week time and call the office for appointment.  Please avoid alcohol, smoking, or any other illicit substance and maintain healthy habits including taking your regular medications as prescribed.  You were cared for by a hospitalist during your hospital stay. If you have any questions about your discharge medications or the care you received while you were in the hospital after you are discharged, you can call the unit and ask to speak with the hospitalist on call if the hospitalist that took care of you is not available.  Once you are discharged, your primary care physician will handle any further medical issues. Please note that NO REFILLS for any discharge medications will be authorized once you are discharged, as it is  imperative that you return to your primary care physician (or establish a relationship with a primary care physician if you do not have one) for your aftercare needs so that they can reassess your need for medications and monitor your lab values   Increase activity slowly   Complete by: As directed       Allergies as of 09/02/2022       Reactions   Cephalosporins Nausea Only        Medication List     TAKE these medications    aspirin EC 81 MG tablet Take 1 tablet (81 mg total) by mouth daily. Swallow whole.   atorvastatin 40 MG tablet Commonly known as: LIPITOR Take 1 tablet (40 mg total) by mouth daily.   lisinopril 20 MG tablet Commonly known as: ZESTRIL Take 1 tablet (20 mg total) by mouth daily.   metFORMIN 500 MG tablet Commonly known as: GLUCOPHAGE Take 1 tablet (500 mg total) by mouth daily with breakfast. Take 2 every morning and 1 every evening What changed: additional instructions   potassium chloride SA 20 MEQ tablet Commonly known as: KLOR-CON M Take 1 tablet (20 mEq total) by mouth daily.        Follow-up Information     Lillard Anes, MD Follow up in 1 week(s).   Specialty: Family Medicine  Contact information: [address] [place] [address] [phone]         Belva Crome, MD .   Specialty: Cardiology Contact information: [phone] [address] [place] [address] [phone]         [facility] [place]. [facility] Follow up.   Specialty: Cardiology Why: our office will call for the monitor for you to wear, Dr. Thompson Caul office. Contact information: [address] [place] [address] [phone]               Allergies  Allergen Reactions   Cephalosporins Nausea Only    The results of significant diagnostics from this hospitalization (including imaging, microbiology, ancillary and laboratory) are  listed below for reference.    Microbiology: No results found for this or any previous visit (from the past 240 hour(s)).  Procedures/Studies: EEG adult  Result Date: 2022/09/08 Lora Havens, MD     08-Sep-2022  4:41 PM Patient Name: Donovan Gatchel MRN: [id number] Epilepsy Attending: Lora Havens Referring Physician/Provider: Vianne Bulls, MD Date: 09/08/2022 Duration: 22.04 mins Patient history: 88yo F with syncope. EEG to evaluate for seizure. Level of alertness: Awake AEDs during EEG study: None Technical aspects: This EEG study was done with scalp electrodes positioned according to the 10-20 International system of electrode placement. Electrical activity was reviewed with band pass filter of 1-'70Hz'$ , sensitivity of 7 uV/mm, display speed of 25m/sec with a '60Hz'$  notched filter applied as appropriate. EEG data were recorded continuously and digitally stored.  Video monitoring was available and reviewed as appropriate. Description: The posterior dominant rhythm consists of 8 Hz activity of moderate voltage (25-35 uV) seen predominantly in posterior head regions, symmetric and reactive to eye opening and eye closing. Hyperventilation and photic stimulation were not performed.   EKG artifact was seen during the study. IMPRESSION: This study is within normal limits. No seizures or epileptiform discharges were seen throughout the recording. A normal interictal EEG does not exclude nor support the diagnosis of epilepsy. PLora Havens  ECHOCARDIOGRAM COMPLETE  Result Date: 909-14-23   ECHOCARDIOGRAM REPORT   Patient Name:   Cleo SHIVER Mcintyre Date of Exam: 914-Sep-2023Medical Rec #:  [id number]          Height:       66.0 in Accession #:    [id number]         Weight:       146.7 lb Date of Birth:  325-Jun-1934          BSA:          1.753 m Patient Age:    89years            BP:           163/83 mmHg Patient Gender: F                   HR:           77 bpm. Exam Location:  Inpatient Procedure: 2D  Echo, Cardiac Doppler and Color Doppler Indications:    Syncope  History:        Patient has prior history of Echocardiogram examinations, most                 recent 12/14/2020. Risk Factors:Hypertension and Diabetes.  Aortic Valve: 21 mm Carpentier-Edwards bioprosthetic valve is                 present in the aortic position.  Sonographer:    Jefferey Pica Referring Phys: [id number] [place]  1. S/P AVR with mean gradient 18 mmHg and AVA 1.9 cm2.  2. Left ventricular ejection fraction, by estimation, is 55 to 60%. The left ventricle has normal function. The left ventricle has no regional wall motion abnormalities. There is moderate left ventricular hypertrophy. Left ventricular diastolic parameters are consistent with Grade I diastolic dysfunction (impaired relaxation).  3. Right ventricular systolic function is normal. The right ventricular size is normal.  4. The mitral valve is normal in structure. Trivial mitral valve regurgitation. No evidence of mitral stenosis. Moderate mitral annular calcification.  5. The aortic valve has been repaired/replaced. Aortic valve regurgitation is trivial. No aortic stenosis is present. There is a 21 mm Carpentier-Edwards bioprosthetic valve present in the aortic position.  6. The inferior vena cava is normal in size with greater than 50% respiratory variability, suggesting right atrial pressure of 3 mmHg. Comparison(s): No significant change from prior study. FINDINGS  Left Ventricle: Left ventricular ejection fraction, by estimation, is 55 to 60%. The left ventricle has normal function. The left ventricle has no regional wall motion abnormalities. The left ventricular internal cavity size was normal in size. There is  moderate left ventricular hypertrophy. Abnormal (paradoxical) septal motion, consistent with left bundle branch block. Left ventricular diastolic parameters are consistent with Grade I diastolic dysfunction (impaired relaxation).  Right Ventricle: The right ventricular size is normal. Right ventricular systolic function is normal. Left Atrium: Left atrial size was normal in size. Right Atrium: Right atrial size was normal in size. Pericardium: There is no evidence of pericardial effusion. Mitral Valve: The mitral valve is normal in structure. Moderate mitral annular calcification. Trivial mitral valve regurgitation. No evidence of mitral valve stenosis. Tricuspid Valve: The tricuspid valve is normal in structure. Tricuspid valve regurgitation is trivial. No evidence of tricuspid stenosis. Aortic Valve: The aortic valve has been repaired/replaced. Aortic valve regurgitation is trivial. No aortic stenosis is present. Aortic valve mean gradient measures 18.3 mmHg. Aortic valve peak gradient measures 36.1 mmHg. Aortic valve area, by VTI measures 1.89 cm. There is a 21 mm Carpentier-Edwards bioprosthetic valve present in the aortic position. Pulmonic Valve: The pulmonic valve was not well visualized. Pulmonic valve regurgitation is not visualized. No evidence of pulmonic stenosis. Aorta: The aortic root is normal in size and structure. Venous: The inferior vena cava is normal in size with greater than 50% respiratory variability, suggesting right atrial pressure of 3 mmHg. IAS/Shunts: No atrial level shunt detected by color flow Doppler. Additional Comments: S/P AVR with mean gradient 18 mmHg and AVA 1.9 cm2.  LEFT VENTRICLE PLAX 2D LVIDd:         2.80 cm LVIDs:         2.10 cm LV PW:         1.30 cm LV IVS:        1.40 cm LVOT diam:     1.90 cm LV SV:         97 LV SV Index:   55 LVOT Area:     2.84 cm  RIGHT VENTRICLE             IVC RV Basal diam:  2.50 cm     IVC diam: 1.40 cm RV S prime:     10.00  cm/s TAPSE (M-mode): 1.6 cm LEFT ATRIUM             Index        RIGHT ATRIUM           Index LA diam:        4.00 cm 2.28 cm/m   RA Area:     12.70 cm LA Vol (A2C):   60.8 ml 34.68 ml/m  RA Volume:   25.10 ml  14.32 ml/m LA Vol (A4C):   34.5  ml 19.68 ml/m LA Biplane Vol: 46.9 ml 26.75 ml/m  AORTIC VALVE                     PULMONIC VALVE AV Area (Vmax):    1.88 cm      PV Vmax:       0.88 m/s AV Area (Vmean):   1.92 cm      PV Peak grad:  3.1 mmHg AV Area (VTI):     1.89 cm AV Vmax:           300.33 cm/s AV Vmean:          196.667 cm/s AV VTI:            0.516 m AV Peak Grad:      36.1 mmHg AV Mean Grad:      18.3 mmHg LVOT Vmax:         199.00 cm/s LVOT Vmean:        133.000 cm/s LVOT VTI:          0.343 m LVOT/AV VTI ratio: 0.67  AORTA Ao Root diam: 2.70 cm Ao Asc diam:  3.60 cm MITRAL VALVE MV Area (PHT): 5.38 cm     SHUNTS MV Decel Time: 141 msec     Systemic VTI:  0.34 m MV E velocity: 75.30 cm/s   Systemic Diam: 1.90 cm MV A velocity: 159.00 cm/s MV E/A ratio:  0.47 Kirk Ruths MD Electronically signed by Kirk Ruths MD Signature Date/Time: 09/01/2022/12:25:09 PM    Final    CT ANGIO HEAD NECK W WO CM  Result Date: 08/31/2022 CLINICAL DATA:  TIA, intermittent episodes of near syncope; recent MRI with small subacute lacunar infarcts EXAM: CT ANGIOGRAPHY HEAD AND NECK TECHNIQUE: Multidetector CT imaging of the head and neck was performed using the standard protocol during bolus administration of intravenous contrast. Multiplanar CT image reconstructions and MIPs were obtained to evaluate the vascular anatomy. Carotid stenosis measurements (when applicable) are obtained utilizing NASCET criteria, using the distal internal carotid diameter as the denominator. RADIATION DOSE REDUCTION: This exam was performed according to the departmental dose-optimization program which includes automated exposure control, adjustment of the mA and/or kV according to patient size and/or use of iterative reconstruction technique. CONTRAST:  64m OMNIPAQUE IOHEXOL 350 MG/ML SOLN COMPARISON:  CT head 07/20/2022, MRI head 08/18/2022, MRA 12/13/2020 FINDINGS: CT HEAD FINDINGS Brain: No evidence of acute infarct, hemorrhage, mass, mass effect, or midline shift.  No hydrocephalus or extra-axial fluid collection. Basal ganglia calcifications. Redemonstrated encephalomalacia in the left parieto-occipital region, likely sequela of remote infarct. Lacunar infarcts in the right corona radiata and centrum semiovale correlate with the subacute infarcts seen on 08/18/2022. No other new hypodensity. Periventricular white matter changes, likely the sequela of chronic small vessel ischemic disease. Redemonstrated intra sellar mass with suprasellar extension, measuring up to 2.4 cm (series 10, image 34), unchanged when compared to both the prior CT head and the most recent MRI. Vascular: No  hyperdense vessel. Atherosclerotic calcifications in the intracranial carotid and vertebral arteries. Skull: Normal. Negative for fracture or focal lesion. Sinuses/Orbits: Mucosal thickening in the sphenoid sinuses. Status post bilateral lens replacements. Other: The mastoid air cells are well aerated. CTA NECK FINDINGS Aortic arch: Two-vessel arch with a common origin of the brachiocephalic and left common carotid arteries. Imaged portion shows no evidence of aneurysm or dissection. Approximate 50% stenosis of the proximal left subclavian artery (series 11, image 40). No other significant stenosis of the major arch vessel origins. Right carotid system: No evidence of dissection, occlusion, or hemodynamically significant stenosis (greater than 50%). Atherosclerotic disease at the bifurcation and in the proximal and distal ICA is not hemodynamically significant. Left carotid system: No evidence of dissection, occlusion, or hemodynamically significant stenosis (greater than 50%). Atherosclerotic disease in the distal ICA is not hemodynamically significant. Vertebral arteries: No evidence of dissection, occlusion, or hemodynamically significant stenosis (greater than 50%). Skeleton: Osteopenia.  No acute osseous abnormality. Other neck: Multinodular goiter. Upper chest: Negative. Review of the MIP  images confirms the above findings CTA HEAD FINDINGS Anterior circulation: Both internal carotid arteries are patent to the termini, without significant stenosis. Patent left A1. Aplastic right A1. Normal anterior communicating artery. Anterior cerebral arteries are patent to their distal aspects. No M1 stenosis or occlusion. MCA branches perfused and symmetric. Posterior circulation: Vertebral arteries patent to the vertebrobasilar junction without stenosis. Posterior inferior cerebellar arteries patent proximally. Basilar patent to its distal aspect. Superior cerebellar arteries patent proximally. Patent left P1. Fetal origin of the right PCA from the right posterior communicating artery. PCAs perfused to their distal aspects without stenosis. The left posterior communicating artery is not definitively visualized. Venous sinuses: As permitted by contrast timing, patent. Anatomic variants: Fetal origin of the right PCA. Aplastic right A1. Review of the MIP images confirms the above findings IMPRESSION: 1.  No intracranial large vessel occlusion or significant stenosis. 2. Approximate 50% stenosis of the proximal left subclavian artery. No other hemodynamically significant stenosis in the neck. 3.  No acute intracranial process. 4. Redemonstrated sellar mass with suprasellar extension, which appears grossly similar to the prior CT head and most recent MRI. 5. Multinodular goiter. If this has not previously been evaluated, a non-emergent ultrasound of the thyroid is recommended, if clinically indicated given patient age. (Reference: J Am Coll Radiol. 2015 Feb;12(2): 143-50) Electronically Signed   By: Merilyn Baba M.D.   On: 08/31/2022 22:00   VAS US CAROTID  Result Date: 08/03/2022 Per Dr Blanch Media note. No need surgery. Less than 50% stenosis. Results were given to Patient's daughter.   Labs: BNP (last 3 results) No results for input(s): "BNP" in the last 8760 hours. Basic Metabolic Panel: Recent Labs  Lab  08/31/22 1729 09/01/22 0713  NA 145 147*  K 3.1* 4.2  CL 112* 113*  CO2 23 27  GLUCOSE 123* 131*  BUN 35* 26*  CREATININE 0.91 0.87  CALCIUM 8.6* 8.2*  MG 2.1 2.3   Liver Function Tests: No results for input(s): "AST", "ALT", "ALKPHOS", "BILITOT", "PROT", "ALBUMIN" in the last 168 hours. No results for input(s): "LIPASE", "AMYLASE" in the last 168 hours. No results for input(s): "AMMONIA" in the last 168 hours. CBC: Recent Labs  Lab 08/31/22 1729 09/01/22 0713  WBC 12.5* 12.3*  NEUTROABS 7.8*  --   HGB 11.9* 11.2*  HCT 38.1 36.3  MCV 92.0 92.8  PLT 188 152   Cardiac Enzymes: No results for input(s): "CKTOTAL", "CKMB", "CKMBINDEX", "TROPONINI" in the  last 168 hours. BNP: Invalid input(s): "POCBNP" CBG: Recent Labs  Lab 09/01/22 1215 09/01/22 1712 09/01/22 2238 09/02/22 0549 09/02/22 1125  GLUCAP 134* 124* 168* 125* 155*   D-Dimer No results for input(s): "DDIMER" in the last 72 hours. Hgb A1c No results for input(s): "HGBA1C" in the last 72 hours. Lipid Profile No results for input(s): "CHOL", "HDL", "LDLCALC", "TRIG", "CHOLHDL", "LDLDIRECT" in the last 72 hours. Thyroid function studies No results for input(s): "TSH", "T4TOTAL", "T3FREE", "THYROIDAB" in the last 72 hours.  Invalid input(s): "FREET3" Anemia work up No results for input(s): "VITAMINB12", "FOLATE", "FERRITIN", "TIBC", "IRON", "RETICCTPCT" in the last 72 hours. Urinalysis    Component Value Date/Time   COLORURINE YELLOW 08/31/2022 2212   APPEARANCEUR CLEAR 08/31/2022 2212   LABSPEC 1.038 (H) 08/31/2022 2212   PHURINE 6.0 08/31/2022 2212   GLUCOSEU NEGATIVE 08/31/2022 2212   HGBUR NEGATIVE 08/31/2022 2212   BILIRUBINUR NEGATIVE 08/31/2022 2212   BILIRUBINUR negative 03/01/2022 1206   BILIRUBINUR Negative 11/01/2021 1152   KETONESUR NEGATIVE 08/31/2022 2212   PROTEINUR NEGATIVE 08/31/2022 2212   UROBILINOGEN 1.0 03/01/2022 1206   NITRITE NEGATIVE 08/31/2022 2212   LEUKOCYTESUR TRACE  (A) 08/31/2022 2212   Sepsis Labs Recent Labs  Lab 08/31/22 1729 09/01/22 0713  WBC 12.5* 12.3*   Microbiology No results found for this or any previous visit (from the past 240 hour(s)).   Time coordinating discharge: 25 minutes  SIGNED: Antonieta Pert, MD  [facility] 09/02/2022, 2:01 PM  If 7PM-7AM, please contact night-coverage [email or web address]

## 2022-09-02 NOTE — Evaluation (Signed)
Physical Therapy Evaluation Patient Details Name: Christine Newton MRN: [id number] DOB: 01/11/1933 Today's Date: 09/02/2022  History of Present Illness  Pt is an 88 y.o. F presenting to the ED from neurologist office or expedited work-up to include CTA of the head and neck, echocardiogram, EEG, and 30-day monitor for unresponsive episodes recent MRI brain that was negative for acute findings and EEG notable for left temporal slowing. PMH includes HTN, type 2 diabetes mellitus, and dementia,  Patient is incontinent of urine at baseline.  Clinical Impression  Pt admitted with above diagnosis. Pt able to ambulate only a short distance due to incr HR to 158 bpm with activity.  Notified MD of incr HR.  Daughter feels that pt is close to baseline as far as mobility is concerned so if medical issues are resolved, daughter can take pt home.  Recommend HHPT as pt with poor endurance and would benefit.   Pt currently with functional limitations due to the deficits listed below (see PT Problem List). Pt will benefit from skilled PT to increase their independence and safety with mobility to allow discharge to the venue listed below.          Recommendations for follow up therapy are one component of a multi-disciplinary discharge planning process, led by the attending physician.  Recommendations may be updated based on patient status, additional functional criteria and insurance authorization.  Follow Up Recommendations Home health PT      Assistance Recommended at Discharge Intermittent Supervision/Assistance  Patient can return home with the following  A little help with walking and/or transfers;A little help with bathing/dressing/bathroom;Assistance with cooking/housework;Direct supervision/assist for medications management;Direct supervision/assist for financial management;Assist for transportation;Help with stairs or ramp for entrance    Equipment Recommendations None recommended by PT   Recommendations for Other Services       Functional Status Assessment Patient has had a recent decline in their functional status and demonstrates the ability to make significant improvements in function in a reasonable and predictable amount of time.     Precautions / Restrictions Precautions Precautions: Fall Restrictions Weight Bearing Restrictions: No Other Position/Activity Restrictions: Monitor blood pressure and HR      Mobility  Bed Mobility               General bed mobility comments: Pt in chair on arrival    Transfers Overall transfer level: Needs assistance Equipment used: Rolling walker (2 wheels) Transfers: Sit to/from Stand Sit to Stand: Min assist           General transfer comment: Sit to stand from recliner wtih pt using rocking motion for momentum and this helped her stand with little assist with pt also needing  cues for hand placement.    Ambulation/Gait Ambulation/Gait assistance: Min guard, Min assist Gait Distance (Feet): 15 Feet Assistive device: Rolling walker (2 wheels) Gait Pattern/deviations: Step-through pattern, Decreased stride length, Trunk flexed, Drifts right/left, Shuffle   Gait velocity interpretation: <1.31 ft/sec, indicative of household ambulator   General Gait Details: Pt able to ambulate with RW a short distance only with min to min guard assist needing assist to steer around objects and cues to stay close to RW.  Short distance due to HR up to 158 bpm with only about 7 steps therefore turned and went back to chair. Notified nurse and MD of incr HR with movement.  Stairs            Wheelchair Mobility    Modified Rankin (Stroke Patients Only)  Balance       Sitting balance - Comments: static sitting-good. dynamic sitting-fair+       Standing balance comment: static standing-min guard with rolling walker. dynamic standing-min guard to min assist with rolling walker                              Pertinent Vitals/Pain Pain Assessment Pain Assessment: No/denies pain    Home Living Family/patient expects to be discharged to:: Private residence Living Arrangements: Children Available Help at Discharge: Family;Available 24 hours/day (Nurse M-F, 5 hours) Type of Home: House Home Access: Level entry       Home Layout: One level Home Equipment: Conservation officer, nature (2 wheels);Tub bench;Hospital bed;Grab bars - tub/shower;BSC/3in1;Transport chair;Wheelchair - manual;Hand held shower head (gait belts, 2 bedside commodes, Mobile help device) Additional Comments: The pt's daughter was present during the session and assisted in providing information regarding the pt's prior level of functioning & living situation. The pt lives with her daughter in a ground floor apartment. The pt has a home health nurse 5 days per week, for ~5 hrs each day who assisted her with bathing, lower body dressing, and household chores. The pt ambulated household distances with supervision using a RW. The pt was independent with feeding & required occasional assistance with toileting.    Prior Function Prior Level of Function : Needs assist             Mobility Comments: Walks with RW with Modif I but typically has family with her ADLs Comments: B/D, toileting, cooking, cleaning     Hand Dominance   Dominant Hand: Left    Extremity/Trunk Assessment   Upper Extremity Assessment Upper Extremity Assessment: Defer to OT evaluation    Lower Extremity Assessment Lower Extremity Assessment: Generalized weakness    Cervical / Trunk Assessment Cervical / Trunk Assessment: Kyphotic  Communication   Communication: No difficulties  Cognition Arousal/Alertness: Awake/alert Behavior During Therapy: WFL for tasks assessed/performed Overall Cognitive Status: History of cognitive impairments - at baseline                                 General Comments: Pt has a history of dementia. She  required occasional cues for memory/recall & she required occasional repetition of prompts.        General Comments General comments (skin integrity, edema, etc.): See flowsheet for orthostatic VS and incr HR to 158 bpm    Exercises     Assessment/Plan    PT Assessment Patient needs continued PT services  PT Problem List Decreased mobility;Decreased balance;Decreased activity tolerance;Decreased knowledge of use of DME;Decreased safety awareness;Decreased knowledge of precautions;Cardiopulmonary status limiting activity       PT Treatment Interventions DME instruction;Gait training;Functional mobility training;Therapeutic activities;Therapeutic exercise;Balance training;Patient/family education    PT Goals (Current goals can be found in the Care Plan section)  Acute Rehab PT Goals Patient Stated Goal: to go home PT Goal Formulation: With patient Time For Goal Achievement: 09/16/22 Potential to Achieve Goals: Good    Frequency Min 3X/week     Co-evaluation               AM-PAC PT "6 Clicks" Mobility  Outcome Measure Help needed turning from your back to your side while in a flat bed without using bedrails?: A Little Help needed moving from lying on your back to sitting on the side of  a flat bed without using bedrails?: A Little Help needed moving to and from a bed to a chair (including a wheelchair)?: A Little Help needed standing up from a chair using your arms (e.g., wheelchair or bedside chair)?: A Little Help needed to walk in hospital room?: A Lot Help needed climbing 3-5 steps with a railing? : A Lot 6 Click Score: 16    End of Session Equipment Utilized During Treatment: Gait belt Activity Tolerance: Patient limited by fatigue (incr HR with activity) Patient left: in chair;with call bell/phone within reach;with family/visitor present Nurse Communication: Mobility status PT Visit Diagnosis: Muscle weakness (generalized) (M62.81)    Time: 2751-7001 PT Time  Calculation (min) (ACUTE ONLY): 32 min   Charges:   PT Evaluation $PT Eval Moderate Complexity: 1 Mod PT Treatments $Gait Training: 8-22 mins        Makai Agostinelli M,PT Acute Rehab Services [phone]   Alvira Philips 09/02/2022, 11:28 AM

## 2022-09-02 NOTE — TOC Transition Note (Signed)
Transition of Care [facility]) - CM/SW Discharge Note   Patient Details  Name: Christine Newton MRN: [id number] Date of Birth: [birth year]  Transition of Care [facility]) CM/SW Contact:  Leeroy Cha, RN Phone Number: 09/02/2022, 2:27 PM   Clinical Narrative:    Hhc will be done through [place] for pt and ot   Final next level of care: [place] Barriers to Discharge: Barriers Resolved   Patient Goals and CMS Choice Patient states their goals for this hospitalization and ongoing recovery are:: to return to my home CMS Medicare.gov Compare Post Acute Care list provided to:: Patient Choice offered to / list presented to : Patient  Discharge Placement                       Discharge Plan and Services   Discharge Planning Services: CM Consult Post Acute Care Choice: Home Health                    HH Arranged: PT, OT [facility] Agency: [place] Date [place]: 09/02/22 Time HH Agency Contacted: [phone] Representative spoke with at [place]: corey  Social Determinants of Health ([place]) Interventions     Readmission Risk Interventions   No data to display

## 2022-09-02 NOTE — Evaluation (Signed)
Occupational Therapy Evaluation Patient Details Name: Christine Newton MRN: [id number] DOB: [birth year] Today's Date: 09/02/2022   History of Present Illness Pt is an 88 y.o. F presenting to the ED from neurologist office or expedited work-up to include CTA of the head and neck, echocardiogram, EEG, and 30-day monitor for unresponsive episodes recent MRI brain that was negative for acute findings and EEG notable for left temporal slowing. PMH includes HTN, type 2 diabetes mellitus, and dementia,  Patient is incontinent of urine at baseline.   Clinical Impression   Christine Newton was found in the semi-fowler's position, she was in agreement to participate in the evaluation process, and her daughter was present throughout the session. The pt required min assist with increased time and effort, an elevated HOB, and intermittent cues for transfer technique, in order to perform supine to sit & scooting to the EOB. She presented with good static sitting balance & fair+ dynamic sitting balance. She further required max assist for simulated lower body dressing for sock management, min assist to stand using a RW, CGA-min assist to stand-pivot to the bedside commode using a RW, mod assist for toileting at bedside commode level, and set-up assist for grooming seated in the bedside chair. She was noted to be with general deconditioning, slight generalized weakness, and concern for orthostatic hypotension; she denied having feelings of dizziness or lightheadedness with activity.  See below for taken vitals (nurse updated):     09/02/22 0900  Orthostatic Lying   BP- Lying 189/89  Pulse- Lying 76  Orthostatic Sitting  BP- Sitting (!) 167/92  Pulse- Sitting 79  Orthostatic Standing at 0 minutes  BP- Standing at 0 minutes 145/89  Pulse- Standing at 0 minutes 127  Orthostatic Standing at 3 minutes  BP- Standing at 3 minutes 124/88  Pulse- Standing at 3 minutes 160     She will continue to benefit from further OT  services to maximize her safety and independence with self-care tasks, and to decrease the risk for restricted participation in meaningful activities. Pt's family can provide needed in-home assistance and supervision, therefore she can return home with such, in addition to home health therapy.   Recommendations for follow up therapy are one component of a multi-disciplinary discharge planning process, led by the attending physician.  Recommendations may be updated based on patient status, additional functional criteria and insurance authorization.   Follow Up Recommendations  Home health OT    Assistance Recommended at Discharge Intermittent Supervision/Assistance  Patient can return home with the following Direct supervision/assist for medications management;Assistance with cooking/housework;A lot of help with bathing/dressing/bathroom;A little help with walking and/or transfers    Functional Status Assessment  Patient has had a recent decline in their functional status and demonstrates the ability to make significant improvements in function in a reasonable and predictable amount of time.  Equipment Recommendations   (Patient already has needed equipment)       Precautions / Restrictions Precautions Precautions: Fall Restrictions Weight Bearing Restrictions: No Other Position/Activity Restrictions: Monitor blood pressure      Mobility Bed Mobility Overal bed mobility: Needs Assistance Bed Mobility: Supine to Sit     Supine to sit: Min assist, HOB elevated     General bed mobility comments: She required increased time and effort, as well as cues for use of bed rail and to advance B LE    Transfers Overall transfer level: Needs assistance Equipment used: Rolling walker (2 wheels), 1 person hand held assist Transfers: Sit to/from Stand  Sit to Stand: Min assist           General transfer comment: Sit to stand from elevated bed surface; cues provided to push up from the  transfer surface with at least 1 upper extremity      Balance       Sitting balance - Comments: static sitting-good. dynamic sitting-fair+       Standing balance comment: static standing-CGA with rolling walker. dynamic standing-CGA to min assist with rolling walker             ADL either performed or assessed with clinical judgement   ADL   Eating/Feeding: Set up Eating/Feeding Details (indicate cue type and reason): at chair level, based on clinical judgement Grooming: Set up Grooming Details (indicate cue type and reason): She performed hand washing seated in the bedside chair             Lower Body Dressing: Maximal assistance   Toilet Transfer: Minimal assistance;Cueing for sequencing;BSC/3in1;Rolling walker (2 wheels)   Toileting- Clothing Manipulation and Hygiene: Moderate assistance;Sit to/from stand Toileting - Clothing Manipulation Details (indicate cue type and reason): She required assistance for clothing management, verbal cues for best walker placement, and she performed anterior peri-hygiene in standing with CGA.             Vision   Additional Comments: Not formally assessed, however appeared [facility] for participation in the session                Pertinent Vitals/Pain Pain Assessment Pain Assessment: No/denies pain     Hand Dominance Left   Extremity/Trunk Assessment Upper Extremity Assessment Upper Extremity Assessment:  (B UE AROM WFL. B UE grip strength grossly 4-/5)   Lower Extremity Assessment Lower Extremity Assessment: Generalized weakness       Communication Communication Communication: No difficulties   Cognition Arousal/Alertness: Awake/alert Behavior During Therapy: WFL for tasks assessed/performed Overall Cognitive Status: History of cognitive impairments - at baseline               General Comments: Pt has a history of dementia. She required occasional cues for memory/recall & she required occasional repetition  of prompts.     General Comments   Monitor blood pressure            Home Living Family/patient expects to be discharged to:: Private residence Living Arrangements: Children (daughter) Available Help at Discharge: Family Type of Home: Apartment Home Access: Level entry     Home Layout: One level     Bathroom Shower/Tub: Tub/shower unit        Home Equipment: Conservation officer, nature (2 wheels);Tub bench;Hospital bed;Grab bars - tub/shower;BSC/3in1;Transport chair;Wheelchair - manual;Hand held shower head, gait belts, 2 bedside commodes Mobile help device   Additional Comments: The pt's daughter was present during the session and assisted in providing information regarding the pt's prior level of functioning & living situation. The pt lives with her daughter in a ground floor apartment. The pt has a home health nurse 5 days per week, for ~5 hrs each day who assisted her with bathing, lower body dressing, and household chores. The pt ambulated household distances with supervision using a RW. The pt was independent with feeding & required occasional assistance with toileting.      Prior Functioning/Environment Prior Level of Function : Needs assist             Mobility Comments: Supervision for household ambulation using a RW ADLs Comments: Required assist for bathing, lower body dressing,  and occasionally for toileting; independent with feeding        OT Problem List: Decreased strength;Decreased range of motion;Impaired balance (sitting and/or standing);Decreased cognition;Decreased knowledge of use of DME or AE      OT Treatment/Interventions: Self-care/ADL training;Therapeutic exercise;Energy conservation;DME and/or AE instruction;Therapeutic activities;Cognitive remediation/compensation;Patient/family education;Balance training;Visual/perceptual remediation/compensation    OT Goals(Current goals can be found in the care plan section) Acute Rehab OT Goals Patient Stated Goal:  To return home soon OT Goal Formulation: With patient/family Time For Goal Achievement: 09/16/22 Potential to Achieve Goals: Good ADL Goals Pt Will Perform Grooming: with supervision;standing Pt Will Perform Upper Body Dressing: with set-up;with supervision Pt Will Transfer to Toilet: with supervision Pt/caregiver will Perform Home Exercise Program: Increased strength;With Supervision;Both right and left upper extremity;With theraband  OT Frequency: Min 2X/week       AM-PAC OT "6 Clicks" Daily Activity     Outcome Measure Help from another person eating meals?: None Help from another person taking care of personal grooming?: None Help from another person toileting, which includes using toliet, bedpan, or urinal?: A Lot Help from another person bathing (including washing, rinsing, drying)?: A Lot Help from another person to put on and taking off regular upper body clothing?: A Little Help from another person to put on and taking off regular lower body clothing?: A Lot 6 Click Score: 17   End of Session Equipment Utilized During Treatment: Gait belt;Rolling walker (2 wheels) Nurse Communication:  (pt's elevated blood pressure at rest, concern about suspected orthostatic hypotension, and pt's positioning at the end of the session)  Activity Tolerance:  (Fair tolerance) Patient left: in chair;with call bell/phone within reach;with family/visitor present  OT Visit Diagnosis: Unsteadiness on feet (R26.81);Muscle weakness (generalized) (M62.81)                Time: 9407-6808 OT Time Calculation (min): 44 min Charges:  OT General Charges $OT Visit: 1 Visit OT Evaluation $OT Eval Moderate Complexity: 1 Mod OT Treatments $Self Care/Home Management : 8-22 mins $Therapeutic Activity: 8-22 mins  Leota Sauers, OTR/L   Chaden Doom L Frenchie Dangerfield 09/02/2022, 11:02 AM

## 2022-09-02 NOTE — Progress Notes (Signed)
Patient being discharged home with family. PIV and tele removed per orders. Discharge teaching completed with patient and daughter and all questions answered. All personal belongings sent home with patient.

## 2022-09-02 NOTE — Plan of Care (Signed)
  Problem: Education: Goal: Knowledge of General Education information will improve Description: Including pain rating scale, medication(s)/side effects and non-pharmacologic comfort measures Outcome: Adequate for Discharge   Problem: Health Behavior/Discharge Planning: Goal: Ability to manage health-related needs will improve Outcome: Adequate for Discharge   Problem: Clinical Measurements: Goal: Ability to maintain clinical measurements within normal limits will improve Outcome: Adequate for Discharge Goal: Will remain free from infection Outcome: Adequate for Discharge Goal: Diagnostic test results will improve Outcome: Adequate for Discharge Goal: Respiratory complications will improve Outcome: Adequate for Discharge Goal: Cardiovascular complication will be avoided Outcome: Adequate for Discharge   Problem: Activity: Goal: Risk for activity intolerance will decrease Outcome: Adequate for Discharge   Problem: Nutrition: Goal: Adequate nutrition will be maintained Outcome: Adequate for Discharge   Problem: Coping: Goal: Level of anxiety will decrease Outcome: Adequate for Discharge   Problem: Elimination: Goal: Will not experience complications related to bowel motility Outcome: Adequate for Discharge Goal: Will not experience complications related to urinary retention Outcome: Adequate for Discharge   Problem: Pain Managment: Goal: General experience of comfort will improve Outcome: Adequate for Discharge   Problem: Safety: Goal: Ability to remain free from injury will improve Outcome: Adequate for Discharge   Problem: Skin Integrity: Goal: Risk for impaired skin integrity will decrease Outcome: Adequate for Discharge   Problem: Education: Goal: Ability to describe self-care measures that may prevent or decrease complications (Diabetes Survival Skills Education) will improve Outcome: Adequate for Discharge Goal: Individualized Educational Video(s) Outcome:  Adequate for Discharge   Problem: Coping: Goal: Ability to adjust to condition or change in health will improve Outcome: Adequate for Discharge   Problem: Fluid Volume: Goal: Ability to maintain a balanced intake and output will improve Outcome: Adequate for Discharge   Problem: Health Behavior/Discharge Planning: Goal: Ability to identify and utilize available resources and services will improve Outcome: Adequate for Discharge Goal: Ability to manage health-related needs will improve Outcome: Adequate for Discharge   Problem: Metabolic: Goal: Ability to maintain appropriate glucose levels will improve Outcome: Adequate for Discharge   Problem: Nutritional: Goal: Maintenance of adequate nutrition will improve Outcome: Adequate for Discharge Goal: Progress toward achieving an optimal weight will improve Outcome: Adequate for Discharge   Problem: Skin Integrity: Goal: Risk for impaired skin integrity will decrease Outcome: Adequate for Discharge   Problem: Tissue Perfusion: Goal: Adequacy of tissue perfusion will improve Outcome: Adequate for Discharge   Problem: Education: Goal: Knowledge of condition and prescribed therapy will improve Outcome: Adequate for Discharge   Problem: Cardiac: Goal: Will achieve and/or maintain adequate cardiac output Outcome: Adequate for Discharge   Problem: Physical Regulation: Goal: Complications related to the disease process, condition or treatment will be avoided or minimized Outcome: Adequate for Discharge

## 2022-09-11 ENCOUNTER — Encounter: Payer: Self-pay | Admitting: Interventional Cardiology

## 2022-09-11 ENCOUNTER — Ambulatory Visit: Payer: Medicare PPO | Attending: Cardiology

## 2022-09-11 DIAGNOSIS — R55 Syncope and collapse: Secondary | ICD-10-CM

## 2022-09-15 ENCOUNTER — Telehealth: Payer: Self-pay | Admitting: Legal Medicine

## 2022-09-15 NOTE — Telephone Encounter (Signed)
Pt daughter called wanting to schedule appt for a hospital follow up from a dc on 09/03/22. DAUGHTER STATED THE HOSPITAL TOLD THEM THEY WERE GOING TO MAIL OUT A HEART MONITOR WHICH DIDN'T ARRIVE UNTIL THURSDAY AND THE DAUGHTER DIDN'T FEEL COMFORTABLE TAKING HER MOTHER OUT WITH HER CONCERNING HEART RATE ISSUES. I TOLD THE DAUGHTER A NURSE WILL CALL TO ASK MORE QUESTIONS AND TO HOW WE CAN GET THE HOSPITAL APPT SCHEDULE OUTSIDE THE WEEK WINDOW.

## 2022-09-16 NOTE — Telephone Encounter (Signed)
Scheduled patient for hospital follow up with daughter.

## 2022-09-19 ENCOUNTER — Ambulatory Visit: Payer: Medicare PPO | Admitting: Nurse Practitioner

## 2022-09-19 ENCOUNTER — Encounter: Payer: Self-pay | Admitting: Nurse Practitioner

## 2022-09-19 VITALS — BP 134/76 | HR 108 | Temp 96.8°F | Wt 142.0 lb

## 2022-09-19 DIAGNOSIS — F03B0 Unspecified dementia, moderate, without behavioral disturbance, psychotic disturbance, mood disturbance, and anxiety: Secondary | ICD-10-CM

## 2022-09-19 DIAGNOSIS — Z87898 Personal history of other specified conditions: Secondary | ICD-10-CM

## 2022-09-19 DIAGNOSIS — I951 Orthostatic hypotension: Secondary | ICD-10-CM | POA: Diagnosis not present

## 2022-09-19 NOTE — Progress Notes (Signed)
Subjective:  Patient ID: Christine Newton, female    DOB: [birth year]  Age: 88 y.o. MRN: [id number]  Chief Complaint  Patient presents with   Hospitalization Follow-up    HPI Christine Newton is an 88 year old African-American female that presents for hospital follow-up for syncope and orthostatic hypotension. She is accompanied by her caregiver, Rico Junker, LPN.  Pt has not had any further syncopal episodes since d/c on 09/02/22.   Follow up Hospitalization  Patient was admitted to [facility] on 08/31/2022 and discharged on 09/02/2022. She was treated for syncope. Treatment for this included monitor. Telephone follow up was done on  She reports good compliance with treatment. She reports this condition is improved.  Current Outpatient Medications on File Prior to Visit  Medication Sig Dispense Refill   aspirin EC 81 MG tablet Take 1 tablet (81 mg total) by mouth daily. Swallow whole. 30 tablet 11   atorvastatin (LIPITOR) 40 MG tablet Take 1 tablet (40 mg total) by mouth daily. 90 tablet 1   lisinopril (ZESTRIL) 20 MG tablet Take 1 tablet (20 mg total) by mouth daily. 90 tablet 3   metFORMIN (GLUCOPHAGE) 500 MG tablet Take 1 tablet (500 mg total) by mouth daily with breakfast. Take 2 every morning and 1 every evening (Patient taking differently: Take 500 mg by mouth daily with breakfast.) 90 tablet 2   potassium chloride SA (KLOR-CON M) 20 MEQ tablet Take 1 tablet (20 mEq total) by mouth daily. (Patient not taking: Reported on 09/01/2022) 10 tablet 0   No current facility-administered medications on file prior to visit.   Past Medical History:  Diagnosis Date   Diabetes ([place])    Hypertension    Nonrheumatic aortic (valve) stenosis 09/21/2020   Past Surgical History:  Procedure Laterality Date   CARDIAC VALVE REPLACEMENT     CHOLECYSTECTOMY      No family history on file. Social History   Socioeconomic History   Marital status: Widowed    Spouse name: Not on file   Number of  children: 2   Years of education: 12 + 4   Highest education level: Not on file  Occupational History   Occupation: RETIRED SOCIAL WORKER  Tobacco Use   Smoking status: Never   Smokeless tobacco: Never  Vaping Use   Vaping Use: Never used  Substance and Sexual Activity   Alcohol use: Never   Drug use: Never   Sexual activity: Not Currently  Other Topics Concern   Not on file  Social History Narrative   Not on file   Social Determinants of Health   Financial Resource Strain: Not on file  Food Insecurity: Not on file  Transportation Needs: Not on file  Physical Activity: Not on file  Stress: Not on file  Social Connections: Not on file    Review of Systems  Unable to perform ROS: Dementia     Objective:  BP 134/76   Pulse (!) 108   Temp (!) 96.8 F (36 C)   Wt 142 lb (64.4 kg)   SpO2 98%   BMI 22.92 kg/m       09/19/2022    2:43 PM 09/02/2022    1:11 PM 09/02/2022    5:46 AM  BP/Weight  Systolic BP  378 588  Diastolic BP  81 84  Wt. (Lbs) 142  150  BMI 22.92 kg/m2  24.21 kg/m2    Physical Exam Vitals reviewed.  Constitutional:      Appearance: Normal appearance.  HENT:  Right Ear: Tympanic membrane normal.     Left Ear: Tympanic membrane normal.     Mouth/Throat:     Mouth: Mucous membranes are moist.  Eyes:     Pupils: Pupils are equal, round, and reactive to light.  Cardiovascular:     Rate and Rhythm: Tachycardia present.     Pulses: Normal pulses.     Heart sounds: Normal heart sounds.  Pulmonary:     Effort: Pulmonary effort is normal.     Breath sounds: Normal breath sounds.  Abdominal:     General: Bowel sounds are normal.     Palpations: Abdomen is soft.  Musculoskeletal:     Comments: Pt seated in w/c  Skin:    General: Skin is warm and dry.     Capillary Refill: Capillary refill takes less than 2 seconds.  Neurological:     Mental Status: She is alert. Mental status is at baseline.  Psychiatric:        Mood and Affect: Mood  normal.        Behavior: Behavior normal.    Lab Results  Component Value Date   WBC 12.3 (H) 09/01/2022   HGB 11.2 (L) 09/01/2022   HCT 36.3 09/01/2022   PLT 152 09/01/2022   GLUCOSE 131 (H) 09/01/2022   CHOL 167 07/12/2022   TRIG 99 07/12/2022   HDL 48 07/12/2022   LDLCALC 101 (H) 07/12/2022   ALT 14 08/03/2022   AST 10 08/03/2022   NA 147 (H) 09/01/2022   K 4.2 09/01/2022   CL 113 (H) 09/01/2022   CREATININE 0.87 09/01/2022   BUN 26 (H) 09/01/2022   CO2 27 09/01/2022   TSH 1.010 11/01/2021   INR 1.0 12/13/2020   HGBA1C 7.0 (H) 07/12/2022      Assessment & Plan:   1. History of syncope - CBC with Differential/Platelet - Comprehensive metabolic panel  2. Orthostatic hypotension - CBC with Differential/Platelet - Comprehensive metabolic panel  3. Moderate dementia without behavioral disturbance, psychotic disturbance, mood disturbance, or anxiety, unspecified dementia type (HCC) - CBC with Differential/Platelet - Comprehensive metabolic panel   Continue follow-up with cardiology as scheduled Continue PT and OT as ordered Continue medications We will call you with lab results Follow-up with Dr Henrene Pastor in 6-weeks for chronic medical problems  Follow-up: PRN  An After Visit Summary was printed and given to the patient.   I, Rip Harbour, NP, have reviewed all documentation for this visit. The documentation on 09/19/22 for the exam, diagnosis, procedures, and orders are all accurate and complete.    Signed, Rip Harbour, NP [place] [phone]

## 2022-09-19 NOTE — Patient Instructions (Addendum)
Continue follow-up with cardiology as scheduled Continue PT and OT as ordered Continue medications We will call you with lab results Follow-up with Dr Henrene Pastor in 6-weeks for chronic medical problems Dehydration, Elderly  Dehydration is condition in which there is not enough water or other fluids in the body. This happens when a person loses more fluids than he or she takes in. Important body parts cannot work right without the right amount of fluids. Any loss of fluids from the body can cause dehydration. People 35 years of age or older have a higher risk of dehydration than younger adults. This is because in older age, the body: Is less able to keep the right amount of water. Does not respond to temperature changes as well. Does not get a sense of thirst as easily or quickly. Dehydration can be mild, worse, or very bad. It should be treated right away to keep it from getting very bad. What are the causes? This condition may be caused by: Conditions that cause loss of water or other fluids, such as: Watery poop (diarrhea). Vomiting. Sweating a lot. Peeing (urinating) a lot. Not drinking enough fluids, especially when you: Are ill. Are doing things that take a lot of energy to do. Other illnesses and conditions, such as fever or infection. Certain medicines, such as medicines that take extra fluid out of the body (diuretics). Lack of safe drinking water. Not being able to get enough water and food. What increases the risk? The following factors may make you more likely to develop this condition: Having a long-term (chronic) illness that has not been treated the right way, such as: An illness that may cause you to pee more, such as diabetes. Kidney, heart, or lung disease. A condition such as dementia. This affects: The brain and nervous system. Thinking. Feelings. Being 48 years of age or older. Having a disability. Living in a place that is high above the ground or sea (high in  altitude). The thinner, drier air causes more fluid loss. What are the signs or symptoms? Symptoms of dehydration depend on how bad it is. Mild or worse dehydration Thirst. Dry lips or dry mouth. Feeling dizzy or light-headed, especially when you stand up from sitting. Muscle cramps. Your body making: Dark pee (urine). Pee may be the color of tea. Less pee than normal. Less tears than normal. Headache. Very bad dehydration Changes in skin. Skin may: Be cold to the touch (clammy). Be blotchy or pale. Not go back to normal right after you lightly pinch it and let it go. Little or no tears, pee, or sweat. Changes in vital signs, such as: Fast breathing. Low blood pressure. Weak pulse. Pulse that is more than 100 beats a minute when you are sitting still. Other changes, such as: Feeling very thirsty. Eyes that look hollow (sunken). Cold hands and feet. Being mixed up (confused). Being very tired (lethargic) or having trouble waking from sleep. Short-term weight loss. Loss of consciousness. How is this treated? Treatment for this condition depends on how bad it is. Treatment should start right away. Do not wait until your condition gets very bad. Very bad dehydration is an emergency. You will need to go to a hospital. Mild or worse dehydration can be treated at home. You may be asked to: Drink more fluids. Drink an oral rehydration solution (ORS). This drink helps get the right amounts of fluids and salts and minerals in the blood (electrolytes). Very bad dehydration can be treated: With fluids through an  IV tube. By getting normal levels of salts and minerals in your blood. This is often done by giving salts and minerals through a tube. The tube is passed through your nose and into your stomach. By treating the root cause. Follow these instructions at home: Oral rehydration solution If told by your doctor, drink an ORS: Make an ORS. Use instructions on the package. Start by  drinking small amounts, about  cup (120 mL) every 5-10 minutes. Slowly drink more until you have had the amount that your doctor said to have. Eating and drinking        Drink enough clear fluid to keep your pee pale yellow. If you were told to drink an ORS, finish the ORS first. Then, start slowly drinking other clear fluids. Drink fluids such as: Water. Do not drink only water. Doing that can make the salt (sodium) level in your body get too low. Water from ice chips you suck on. Fruit juice that you have added water to (diluted). Low-calorie sports drinks. Eat foods that have the right amounts of salts and minerals, such as: Bananas. Oranges. Potatoes. Tomatoes. Spinach. Do not drink alcohol. Avoid: Drinks that have a lot of sugar. These include: High-calorie sports drinks. Fruit juice that you did not add water to. Soda. Caffeine. Foods that are greasy or have a lot of fat or sugar. General instructions Take over-the-counter and prescription medicines only as told by your doctor. Do not take salt tablets. Doing that can make the salt level in your body get too high. Return to your normal activities as told by your doctor. Ask your doctor what activities are safe for you. Keep all follow-up visits as told by your doctor. This is important. Contact a doctor if: You have pain in your belly (abdomen) and the pain: Gets worse. Stays in one place. You have a rash. You have a stiff neck. You get angry or annoyed (irritable) more easily than normal. You are more tired or have a harder time waking than normal. You feel: Weak or dizzy. Very thirsty. Get help right away if you have: Any symptoms of very bad dehydration. A fever. A very bad headache. Symptoms of vomiting, such as: Your vomiting gets worse or does not go away. Your vomit has blood or green stuff in it. You cannot eat or drink without vomiting. Problems with peeing or pooping (having a bowel movement), such  as: Watery poop that gets worse or does not go away. Blood in your poop (stool). This may cause poop to look black and tarry. Not peeing in 6-8 hours. Peeing only a small amount of very dark pee in 6-8 hours. Trouble breathing. Symptoms that get worse with treatment. These symptoms may be an emergency. Do not wait to see if the symptoms will go away. Get medical help right away. Call your local emergency services (911 in the [place] Do not drive yourself to the hospital. Summary Dehydration is a condition in which there is not enough water or other fluids in the body. This happens when a person loses more fluids than he or she takes in. Treatment for this condition depends on how bad it is. Treatment should be started right away. Do not wait until your condition gets very bad. Drink enough clear fluid to keep your pee pale yellow. If you were told to drink an oral rehydration solution (ORS), finish the ORS first. Then, start slowly drinking other clear fluids. Take over-the-counter and prescription medicines only as told by  your doctor. Get help right away if you have any symptoms of very bad dehydration. This information is not intended to replace advice given to you by your health care provider. Make sure you discuss any questions you have with your health care provider. Document Revised: 04/20/2022 Document Reviewed: 07/25/2019 Elsevier Patient Education  [place].  Syncope, Adult  Syncope is when you pass out or faint for a short time. It is caused by a sudden decrease in blood flow to the brain. This can happen for many reasons. It can sometimes happen when seeing blood, getting a shot (injection), or having pain or strong emotions. Most causes of fainting are not dangerous, but in some cases it can be a sign of a serious medical problem. If you faint, get help right away. Call your local emergency services (911 in the [place] Follow these instructions at home: Watch for any  changes in your symptoms. Take these actions to stay safe and help with your symptoms: Knowing when you may be about to faint Signs that you may be about to faint include: Feeling dizzy or light-headed. It may feel like the room is spinning. Feeling weak. Feeling like you may vomit (nauseous). Seeing spots or seeing all white or all black. Having cold, clammy skin. Feeling warm and sweaty. Hearing ringing in the ears. If you start to feel like you might faint, sit or lie down right away. If sitting, lower your head down between your legs. If lying down, raise (elevate) your feet above the level of your heart. Breathe deeply and steadily. Wait until all of the symptoms are gone. Have someone stay with you until you feel better. Medicines Take over-the-counter and prescription medicines only as told by your doctor. If you are taking blood pressure or heart medicine, sit up and stand up slowly. Spend a few minutes getting ready to sit and then stand. This can help you feel less dizzy. Lifestyle Do not drive, use machinery, or play sports until your doctor says it is okay. Do not drink alcohol. Do not smoke or use any products that contain nicotine or tobacco. If you need help quitting, ask your doctor. Avoid hot tubs and saunas. General instructions Talk with your doctor about your symptoms. You may need to have testing to help find the cause. Drink enough fluid to keep your pee (urine) pale yellow. Avoid standing for a long time. If you must stand for a long time, do movements such as: Moving your legs. Crossing your legs. Flexing and stretching your leg muscles. Squatting. Keep all follow-up visits. Contact a doctor if: You have episodes of near fainting. Get help right away if: You pass out or faint. You hit your head or are injured after fainting. You have any of these symptoms: Fast or uneven heartbeats (palpitations). Pain in your chest, belly, or back. Shortness of  breath. You have jerky movements that you cannot control (seizure). You have a very bad headache. You are confused. You have problems with how you see (vision). You are very weak. You have trouble walking. You are bleeding from your mouth or your butt (rectum). You have black or tarry poop (stool). These symptoms may be an emergency. Get help right away. Call your local emergency services (911 in the [place] Do not wait to see if the symptoms will go away. Do not drive yourself to the hospital. Summary Syncope is when you pass out or faint for a short time. It is caused by a sudden  decrease in blood flow to the brain. Signs that you may be about to faint include feeling dizzy or light-headed, feeling like you may vomit, seeing all white or all black, or having cold, clammy skin. If you start to feel like you might faint, sit or lie down right away. Lower your head if sitting, or raise (elevate) your feet if lying down. Breathe deeply and steadily. Wait until all of the symptoms are gone. This information is not intended to replace advice given to you by your health care provider. Make sure you discuss any questions you have with your health care provider. Document Revised: 04/22/2021 Document Reviewed: 04/22/2021 Elsevier Patient Education  [place].

## 2022-09-20 LAB — CBC WITH DIFFERENTIAL/PLATELET
Basophils Absolute: 0.1 10*3/uL (ref 0.0–0.2)
Basos: 1 %
EOS (ABSOLUTE): 0.1 10*3/uL (ref 0.0–0.4)
Eos: 1 %
Hematocrit: 36.9 % (ref 34.0–46.6)
Hemoglobin: 11.9 g/dL (ref 11.1–15.9)
Immature Grans (Abs): 0.3 10*3/uL — ABNORMAL HIGH (ref 0.0–0.1)
Immature Granulocytes: 3 %
Lymphocytes Absolute: 2 10*3/uL (ref 0.7–3.1)
Lymphs: 19 %
MCH: 27.7 pg (ref 26.6–33.0)
MCHC: 32.2 g/dL (ref 31.5–35.7)
MCV: 86 fL (ref 79–97)
Monocytes Absolute: 0.7 10*3/uL (ref 0.1–0.9)
Monocytes: 7 %
Neutrophils Absolute: 7.2 10*3/uL — ABNORMAL HIGH (ref 1.4–7.0)
Neutrophils: 69 %
Platelets: 259 10*3/uL (ref 150–450)
RBC: 4.29 x10E6/uL (ref 3.77–5.28)
RDW: 14.6 % (ref 11.7–15.4)
WBC: 10.4 10*3/uL (ref 3.4–10.8)

## 2022-09-20 LAB — COMPREHENSIVE METABOLIC PANEL
ALT: 10 IU/L (ref 0–32)
AST: 15 IU/L (ref 0–40)
Albumin/Globulin Ratio: 1.4 (ref 1.2–2.2)
Albumin: 3.8 g/dL (ref 3.7–4.7)
Alkaline Phosphatase: 71 IU/L (ref 44–121)
BUN/Creatinine Ratio: 26 (ref 12–28)
BUN: 29 mg/dL — ABNORMAL HIGH (ref 8–27)
Bilirubin Total: 0.3 mg/dL (ref 0.0–1.2)
CO2: 20 mmol/L (ref 20–29)
Calcium: 9 mg/dL (ref 8.7–10.3)
Chloride: 108 mmol/L — ABNORMAL HIGH (ref 96–106)
Creatinine, Ser: 1.1 mg/dL — ABNORMAL HIGH (ref 0.57–1.00)
Globulin, Total: 2.7 g/dL (ref 1.5–4.5)
Glucose: 119 mg/dL — ABNORMAL HIGH (ref 70–99)
Potassium: 4.1 mmol/L (ref 3.5–5.2)
Sodium: 146 mmol/L — ABNORMAL HIGH (ref 134–144)
Total Protein: 6.5 g/dL (ref 6.0–8.5)
eGFR: 49 mL/min/{1.73_m2} — ABNORMAL LOW (ref >59)

## 2022-09-21 ENCOUNTER — Other Ambulatory Visit: Payer: Self-pay

## 2022-09-28 NOTE — Progress Notes (Deleted)
Cardiology Office Note:    Date:  09/28/2022   ID:  Christine Newton, Ryder 1933/08/22, MRN [id number]  PCP:  Lillard Anes, [place] [facility] Prov[facility] Cardiologist:  Sinclair Grooms, MD { Click to update primary MD,subspecialty MD or APP then REFRESH:1}    Referring MD: Lillard Anes,*   Chief Complaint:  No chief complaint on file. {Click here for Visit Info    :1}    History of Present Illness:   Christine Newton is a 88 y.o. female with  history of HTN, DM2, dementia,hyperlipidemia, and AVR with #21 CE biologic with septal myectomy 2014   Patient saw Dr. Tamala Julian 01/2021 after an admission for syncope felt due to low BP and amlodipine stopped. Also had TIA that admission. and it was difficult to discern if it was related to low blood pressure or a true event.  Aspirin and Plavix was started but Plavix had to be discontinued because of rectal bleeding after discharge.  She was also placed on Keppra which she could not tolerate.  Imaging demonstrated severe small vessel CNS disease.  Work-up included an EEG which raised the question of underlying seizure activity.No changes made.  Patient discharged 09/02/22 with episodes of unresponsiveness ? Syncope. CTA head and neck neg, echo with EF 55 to 60%, G1 DD, RV systolic function normal, mitral valve normal, aortic valve repaired/replaced-Carpentier's edward bioprosthetic valve.  EEG-no acute finding.  No recurrence of episode.  She was orthostatic with OT however negative orthostatic with PT this morning although tachycardic with ambulation, giving bolus fluids to see if it helps. Zio monitor ordered.  .       Past Medical History:  Diagnosis Date   Diabetes ([place])    Hypertension    Nonrheumatic aortic (valve) stenosis 09/21/2020   Current Medications: No outpatient medications have been marked as taking for the 10/05/22 encounter (Appointment) with Imogene Burn, PA-C.    Allergies:   Cephalosporins    Social History   Tobacco Use   Smoking status: Never   Smokeless tobacco: Never  Vaping Use   Vaping Use: Never used  Substance Use Topics   Alcohol use: Never   Drug use: Never    Family Hx: The patient's family history is not on file.  ROS   EKGs/Labs/Other Test Reviewed:    EKG:  EKG is *** ordered today.  The ekg ordered today demonstrates ***  Recent Labs: 11/01/2021: TSH 1.010 09/01/2022: Magnesium 2.3 09/19/2022: ALT 10; BUN 29; Creatinine, Ser 1.10; Hemoglobin 11.9; Platelets 259; Potassium 4.1; Sodium 146   Recent Lipid Panel Recent Labs    07/12/22 1520  CHOL 167  TRIG 99  HDL 48  LDLCALC 101*     Prior CV Studies: ECHO COMPLETE WO IMAGING ENHANCING AGENT 09/01/2022  Narrative ECHOCARDIOGRAM REPORT    Patient Name:   Christine Newton Date of Exam: 09/01/2022 Medical Rec #:  [id number]           Height:       66.0 in Accession #:    [id number]          Weight:       146.7 lb Date of Birth:  [birth year]           BSA:          1.753 m Patient Age:    13 years            BP:  163/83 mmHg Patient Gender: F                   HR:           77 bpm. Exam Location:  Inpatient  Procedure: 2D Echo, Cardiac Doppler and Color Doppler  Indications:    Syncope  History:        Patient has prior history of Echocardiogram examinations, most recent 12/14/2020. Risk Factors:Hypertension and Diabetes. Aortic Valve: 21 mm Carpentier-Edwards bioprosthetic valve is present in the aortic position.  Sonographer:    Jefferey Pica Referring Phys: [id number] [place]   1. S/P AVR with mean gradient 18 mmHg and AVA 1.9 cm2. 2. Left ventricular ejection fraction, by estimation, is 55 to 60%. The left ventricle has normal function. The left ventricle has no regional wall motion abnormalities. There is moderate left ventricular hypertrophy. Left ventricular diastolic parameters are consistent with Grade I diastolic dysfunction (impaired  relaxation). 3. Right ventricular systolic function is normal. The right ventricular size is normal. 4. The mitral valve is normal in structure. Trivial mitral valve regurgitation. No evidence of mitral stenosis. Moderate mitral annular calcification. 5. The aortic valve has been repaired/replaced. Aortic valve regurgitation is trivial. No aortic stenosis is present. There is a 21 mm Carpentier-Edwards bioprosthetic valve present in the aortic position. 6. The inferior vena cava is normal in size with greater than 50% respiratory variability, suggesting right atrial pressure of 3 mmHg.  Comparison(s): No significant change from prior study.  FINDINGS Left Ventricle: Left ventricular ejection fraction, by estimation, is 55 to 60%. The left ventricle has normal function. The left ventricle has no regional wall motion abnormalities. The left ventricular internal cavity size was normal in size. There is moderate left ventricular hypertrophy. Abnormal (paradoxical) septal motion, consistent with left bundle branch block. Left ventricular diastolic parameters are consistent with Grade I diastolic dysfunction (impaired relaxation).  Right Ventricle: The right ventricular size is normal. Right ventricular systolic function is normal.  Left Atrium: Left atrial size was normal in size.  Right Atrium: Right atrial size was normal in size.  Pericardium: There is no evidence of pericardial effusion.  Mitral Valve: The mitral valve is normal in structure. Moderate mitral annular calcification. Trivial mitral valve regurgitation. No evidence of mitral valve stenosis.  Tricuspid Valve: The tricuspid valve is normal in structure. Tricuspid valve regurgitation is trivial. No evidence of tricuspid stenosis.  Aortic Valve: The aortic valve has been repaired/replaced. Aortic valve regurgitation is trivial. No aortic stenosis is present. Aortic valve mean gradient measures 18.3 mmHg. Aortic valve peak gradient  measures 36.1 mmHg. Aortic valve area, by VTI measures 1.89 cm. There is a 21 mm Carpentier-Edwards bioprosthetic valve present in the aortic position.  Pulmonic Valve: The pulmonic valve was not well visualized. Pulmonic valve regurgitation is not visualized. No evidence of pulmonic stenosis.  Aorta: The aortic root is normal in size and structure.  Venous: The inferior vena cava is normal in size with greater than 50% respiratory variability, suggesting right atrial pressure of 3 mmHg.  IAS/Shunts: No atrial level shunt detected by color flow Doppler.  Additional Comments: S/P AVR with mean gradient 18 mmHg and AVA 1.9 cm2.   LEFT VENTRICLE PLAX 2D LVIDd:         2.80 cm LVIDs:         2.10 cm LV PW:         1.30 cm LV IVS:  1.40 cm LVOT diam:     1.90 cm LV SV:         97 LV SV Index:   55 LVOT Area:     2.84 cm   RIGHT VENTRICLE             IVC RV Basal diam:  2.50 cm     IVC diam: 1.40 cm RV S prime:     10.00 cm/s TAPSE (M-mode): 1.6 cm  LEFT ATRIUM             Index        RIGHT ATRIUM           Index LA diam:        4.00 cm 2.28 cm/m   RA Area:     12.70 cm LA Vol (A2C):   60.8 ml 34.68 ml/m  RA Volume:   25.10 ml  14.32 ml/m LA Vol (A4C):   34.5 ml 19.68 ml/m LA Biplane Vol: 46.9 ml 26.75 ml/m AORTIC VALVE                     PULMONIC VALVE AV Area (Vmax):    1.88 cm      PV Vmax:       0.88 m/s AV Area (Vmean):   1.92 cm      PV Peak grad:  3.1 mmHg AV Area (VTI):     1.89 cm AV Vmax:           300.33 cm/s AV Vmean:          196.667 cm/s AV VTI:            0.516 m AV Peak Grad:      36.1 mmHg AV Mean Grad:      18.3 mmHg LVOT Vmax:         199.00 cm/s LVOT Vmean:        133.000 cm/s LVOT VTI:          0.343 m LVOT/AV VTI ratio: 0.67  AORTA Ao Root diam: 2.70 cm Ao Asc diam:  3.60 cm  MITRAL VALVE MV Area (PHT): 5.38 cm     SHUNTS MV Decel Time: 141 msec     Systemic VTI:  0.34 m MV E velocity: 75.30 cm/s   Systemic Diam: 1.90 cm MV  A velocity: 159.00 cm/s MV E/A ratio:  0.47  Kirk Ruths MD Electronically signed by Kirk Ruths MD Signature Date/Time: 09/01/2022/12:25:09 PM    Final         Risk Assessment/Calculations/Metrics:   {Does this patient have ATRIAL FIBRILLATION?:[id number]}     No BP recorded.  {Refresh Note OR Click here to enter BP  :1}***    Physical Exam:    VS:  There were no vitals taken for this visit.    Wt Readings from Last 3 Encounters:  09/19/22 142 lb (64.4 kg)  09/02/22 150 lb (68 kg)  08/31/22 140 lb (63.5 kg)    Physical Exam  GEN: Well nourished, well developed, in no acute distress  HEENT: normal  Neck: no JVD, carotid bruits, or masses Cardiac:RRR; no murmurs, rubs, or gallops  Respiratory:  clear to auscultation bilaterally, normal work of breathing GI: soft, nontender, nondistended, + BS Ext: without cyanosis, clubbing, or edema, Good distal pulses bilaterally MS: no deformity or atrophy  Skin: warm and dry, no rash Neuro:  Alert and Oriented x 3, Strength and sensation are intact Psych: euthymic mood, full affect  ASSESSMENT & PLAN:   No problem-specific Assessment & Plan notes found for this encounter.   Episodes of unresponsiveness with full neuro w/u ? Seizures-doesn't tolerate kepra. Zio monitor  AVR bioprosthetic valve stable on echo 08/2022  HTN  HLD  DM2        {Are you ordering a CV Procedure (e.g. stress test, cath, DCCV, TEE, etc)?   Press F2        :[id number]}   Dispo:  No follow-ups on file.   Medication Adjustments/Labs and Tests Ordered: Current medicines are reviewed at length with the patient today.  Concerns regarding medicines are outlined above.  Tests Ordered: No orders of the defined types were placed in this encounter.  Medication Changes: No orders of the defined types were placed in this encounter.  Signed, Ermalinda Barrios, PA-C  09/28/2022 2:55 PM    [facility] [address], [place], [place]   [address] Phone: [phone]; Fax: [phone]

## 2022-10-01 ENCOUNTER — Emergency Department (HOSPITAL_BASED_OUTPATIENT_CLINIC_OR_DEPARTMENT_OTHER): Payer: Medicare PPO

## 2022-10-01 ENCOUNTER — Encounter (HOSPITAL_COMMUNITY): Payer: Self-pay

## 2022-10-01 ENCOUNTER — Other Ambulatory Visit: Payer: Self-pay

## 2022-10-01 ENCOUNTER — Inpatient Hospital Stay (HOSPITAL_BASED_OUTPATIENT_CLINIC_OR_DEPARTMENT_OTHER)
Admission: EM | Admit: 2022-10-01 | Discharge: 2022-10-07 | DRG: 481 | Disposition: A | Discharge status: Skilled Nursing Facility | Payer: Medicare PPO | Attending: Internal Medicine | Admitting: Internal Medicine

## 2022-10-01 ENCOUNTER — Encounter (HOSPITAL_BASED_OUTPATIENT_CLINIC_OR_DEPARTMENT_OTHER): Payer: Self-pay | Admitting: Emergency Medicine

## 2022-10-01 DIAGNOSIS — R41841 Cognitive communication deficit: Secondary | ICD-10-CM | POA: Diagnosis not present

## 2022-10-01 DIAGNOSIS — S72142A Displaced intertrochanteric fracture of left femur, initial encounter for closed fracture: Principal | ICD-10-CM

## 2022-10-01 DIAGNOSIS — E119 Type 2 diabetes mellitus without complications: Secondary | ICD-10-CM | POA: Diagnosis not present

## 2022-10-01 DIAGNOSIS — Z8673 Personal history of transient ischemic attack (TIA), and cerebral infarction without residual deficits: Secondary | ICD-10-CM

## 2022-10-01 DIAGNOSIS — F0390 Unspecified dementia without behavioral disturbance: Secondary | ICD-10-CM | POA: Diagnosis present

## 2022-10-01 DIAGNOSIS — R58 Hemorrhage, not elsewhere classified: Secondary | ICD-10-CM | POA: Diagnosis not present

## 2022-10-01 DIAGNOSIS — D62 Acute posthemorrhagic anemia: Secondary | ICD-10-CM | POA: Diagnosis not present

## 2022-10-01 DIAGNOSIS — Z66 Do not resuscitate: Secondary | ICD-10-CM | POA: Diagnosis present

## 2022-10-01 DIAGNOSIS — F03B0 Unspecified dementia, moderate, without behavioral disturbance, psychotic disturbance, mood disturbance, and anxiety: Secondary | ICD-10-CM

## 2022-10-01 DIAGNOSIS — W19XXXA Unspecified fall, initial encounter: Secondary | ICD-10-CM | POA: Diagnosis not present

## 2022-10-01 DIAGNOSIS — W07XXXA Fall from chair, initial encounter: Secondary | ICD-10-CM | POA: Diagnosis present

## 2022-10-01 DIAGNOSIS — E782 Mixed hyperlipidemia: Secondary | ICD-10-CM | POA: Diagnosis not present

## 2022-10-01 DIAGNOSIS — R410 Disorientation, unspecified: Secondary | ICD-10-CM | POA: Diagnosis not present

## 2022-10-01 DIAGNOSIS — Z043 Encounter for examination and observation following other accident: Secondary | ICD-10-CM | POA: Diagnosis not present

## 2022-10-01 DIAGNOSIS — Z4789 Encounter for other orthopedic aftercare: Secondary | ICD-10-CM | POA: Diagnosis not present

## 2022-10-01 DIAGNOSIS — R0781 Pleurodynia: Secondary | ICD-10-CM | POA: Diagnosis not present

## 2022-10-01 DIAGNOSIS — R918 Other nonspecific abnormal finding of lung field: Secondary | ICD-10-CM | POA: Diagnosis not present

## 2022-10-01 DIAGNOSIS — I119 Hypertensive heart disease without heart failure: Secondary | ICD-10-CM | POA: Diagnosis not present

## 2022-10-01 DIAGNOSIS — N179 Acute kidney failure, unspecified: Secondary | ICD-10-CM | POA: Diagnosis not present

## 2022-10-01 DIAGNOSIS — Y92001 Dining room of unspecified non-institutional (private) residence as the place of occurrence of the external cause: Secondary | ICD-10-CM

## 2022-10-01 DIAGNOSIS — S72112A Displaced fracture of greater trochanter of left femur, initial encounter for closed fracture: Secondary | ICD-10-CM | POA: Diagnosis not present

## 2022-10-01 DIAGNOSIS — M25562 Pain in left knee: Secondary | ICD-10-CM | POA: Diagnosis not present

## 2022-10-01 DIAGNOSIS — E1159 Type 2 diabetes mellitus with other circulatory complications: Secondary | ICD-10-CM | POA: Diagnosis not present

## 2022-10-01 DIAGNOSIS — I5032 Chronic diastolic (congestive) heart failure: Secondary | ICD-10-CM | POA: Diagnosis present

## 2022-10-01 DIAGNOSIS — S7292XA Unspecified fracture of left femur, initial encounter for closed fracture: Secondary | ICD-10-CM

## 2022-10-01 DIAGNOSIS — I447 Left bundle-branch block, unspecified: Secondary | ICD-10-CM | POA: Diagnosis not present

## 2022-10-01 DIAGNOSIS — Z7982 Long term (current) use of aspirin: Secondary | ICD-10-CM

## 2022-10-01 DIAGNOSIS — Z7401 Bed confinement status: Secondary | ICD-10-CM | POA: Diagnosis not present

## 2022-10-01 DIAGNOSIS — S0990XA Unspecified injury of head, initial encounter: Secondary | ICD-10-CM | POA: Diagnosis present

## 2022-10-01 DIAGNOSIS — E785 Hyperlipidemia, unspecified: Secondary | ICD-10-CM | POA: Diagnosis present

## 2022-10-01 DIAGNOSIS — S72122A Displaced fracture of lesser trochanter of left femur, initial encounter for closed fracture: Secondary | ICD-10-CM | POA: Diagnosis not present

## 2022-10-01 DIAGNOSIS — S72142D Displaced intertrochanteric fracture of left femur, subsequent encounter for closed fracture with routine healing: Secondary | ICD-10-CM | POA: Diagnosis not present

## 2022-10-01 DIAGNOSIS — I10 Essential (primary) hypertension: Secondary | ICD-10-CM | POA: Diagnosis not present

## 2022-10-01 DIAGNOSIS — M6281 Muscle weakness (generalized): Secondary | ICD-10-CM | POA: Diagnosis not present

## 2022-10-01 DIAGNOSIS — S7292XD Unspecified fracture of left femur, subsequent encounter for closed fracture with routine healing: Secondary | ICD-10-CM | POA: Diagnosis not present

## 2022-10-01 DIAGNOSIS — I081 Rheumatic disorders of both mitral and tricuspid valves: Secondary | ICD-10-CM | POA: Diagnosis not present

## 2022-10-01 DIAGNOSIS — M25552 Pain in left hip: Secondary | ICD-10-CM | POA: Diagnosis not present

## 2022-10-01 DIAGNOSIS — Z881 Allergy status to other antibiotic agents status: Secondary | ICD-10-CM

## 2022-10-01 DIAGNOSIS — M25561 Pain in right knee: Secondary | ICD-10-CM | POA: Diagnosis not present

## 2022-10-01 DIAGNOSIS — S72002A Fracture of unspecified part of neck of left femur, initial encounter for closed fracture: Secondary | ICD-10-CM

## 2022-10-01 DIAGNOSIS — I6782 Cerebral ischemia: Secondary | ICD-10-CM | POA: Diagnosis not present

## 2022-10-01 DIAGNOSIS — D72829 Elevated white blood cell count, unspecified: Secondary | ICD-10-CM | POA: Diagnosis not present

## 2022-10-01 DIAGNOSIS — S72002D Fracture of unspecified part of neck of left femur, subsequent encounter for closed fracture with routine healing: Secondary | ICD-10-CM | POA: Diagnosis not present

## 2022-10-01 DIAGNOSIS — R531 Weakness: Secondary | ICD-10-CM | POA: Diagnosis not present

## 2022-10-01 DIAGNOSIS — Z7984 Long term (current) use of oral hypoglycemic drugs: Secondary | ICD-10-CM | POA: Diagnosis not present

## 2022-10-01 DIAGNOSIS — R739 Hyperglycemia, unspecified: Secondary | ICD-10-CM | POA: Diagnosis not present

## 2022-10-01 DIAGNOSIS — Z79899 Other long term (current) drug therapy: Secondary | ICD-10-CM | POA: Diagnosis not present

## 2022-10-01 DIAGNOSIS — Z952 Presence of prosthetic heart valve: Secondary | ICD-10-CM

## 2022-10-01 DIAGNOSIS — Z9181 History of falling: Secondary | ICD-10-CM | POA: Diagnosis not present

## 2022-10-01 DIAGNOSIS — R2689 Other abnormalities of gait and mobility: Secondary | ICD-10-CM | POA: Diagnosis not present

## 2022-10-01 DIAGNOSIS — E042 Nontoxic multinodular goiter: Secondary | ICD-10-CM | POA: Diagnosis not present

## 2022-10-01 HISTORY — DX: Unspecified fracture of left femur, initial encounter for closed fracture: S72.92XA

## 2022-10-01 LAB — CBC WITH DIFFERENTIAL/PLATELET
Abs Immature Granulocytes: 0.59 10*3/uL — ABNORMAL HIGH (ref 0.00–0.07)
Basophils Absolute: 0.1 10*3/uL (ref 0.0–0.1)
Basophils Relative: 1 %
Eosinophils Absolute: 0 10*3/uL (ref 0.0–0.5)
Eosinophils Relative: 0 %
HCT: 34.1 % — ABNORMAL LOW (ref 36.0–46.0)
Hemoglobin: 10.7 g/dL — ABNORMAL LOW (ref 12.0–15.0)
Immature Granulocytes: 3 %
Lymphocytes Relative: 7 %
Lymphs Abs: 1.5 10*3/uL (ref 0.7–4.0)
MCH: 27.4 pg (ref 26.0–34.0)
MCHC: 31.4 g/dL (ref 30.0–36.0)
MCV: 87.4 fL (ref 80.0–100.0)
Monocytes Absolute: 1.2 10*3/uL — ABNORMAL HIGH (ref 0.1–1.0)
Monocytes Relative: 5 %
Neutro Abs: 19.8 10*3/uL — ABNORMAL HIGH (ref 1.7–7.7)
Neutrophils Relative %: 84 %
Platelets: 259 10*3/uL (ref 150–400)
RBC: 3.9 MIL/uL (ref 3.87–5.11)
RDW: 15.6 % — ABNORMAL HIGH (ref 11.5–15.5)
WBC: 23.2 10*3/uL — ABNORMAL HIGH (ref 4.0–10.5)
nRBC: 0 % (ref 0.0–0.2)

## 2022-10-01 LAB — BASIC METABOLIC PANEL
Anion gap: 7 (ref 5–15)
BUN: 33 mg/dL — ABNORMAL HIGH (ref 8–23)
CO2: 22 mmol/L (ref 22–32)
Calcium: 8.7 mg/dL — ABNORMAL LOW (ref 8.9–10.3)
Chloride: 114 mmol/L — ABNORMAL HIGH (ref 98–111)
Creatinine, Ser: 0.79 mg/dL (ref 0.44–1.00)
GFR, Estimated: >60 mL/min (ref >60)
Glucose, Bld: 207 mg/dL — ABNORMAL HIGH (ref 70–99)
Potassium: 3.5 mmol/L (ref 3.5–5.1)
Sodium: 143 mmol/L (ref 135–145)

## 2022-10-01 LAB — GLUCOSE, CAPILLARY: Glucose-Capillary: 179 mg/dL — ABNORMAL HIGH (ref 70–99)

## 2022-10-01 LAB — PROTIME-INR
INR: 1 (ref 0.8–1.2)
Prothrombin Time: 12.9 seconds (ref 11.4–15.2)

## 2022-10-01 MED ORDER — INSULIN ASPART 100 UNIT/ML IJ SOLN
0.0000 [IU] | INTRAMUSCULAR | Status: DC
  Administered 2022-10-01 – 2022-10-03 (×5): 2 [IU] via SUBCUTANEOUS
  Administered 2022-10-03 (×2): 1 [IU] via SUBCUTANEOUS
  Administered 2022-10-03: 2 [IU] via SUBCUTANEOUS
  Administered 2022-10-03 (×2): 1 [IU] via SUBCUTANEOUS
  Administered 2022-10-03 – 2022-10-04 (×2): 2 [IU] via SUBCUTANEOUS
  Administered 2022-10-05 (×3): 1 [IU] via SUBCUTANEOUS
  Administered 2022-10-06 – 2022-10-07 (×3): 2 [IU] via SUBCUTANEOUS

## 2022-10-01 MED ORDER — ATORVASTATIN CALCIUM 40 MG PO TABS
40.0000 mg | ORAL_TABLET | Freq: Every day | ORAL | Status: DC
  Administered 2022-10-03 – 2022-10-07 (×5): 40 mg via ORAL
  Filled 2022-10-01 (×5): qty 1

## 2022-10-01 MED ORDER — HYDROMORPHONE HCL 1 MG/ML IJ SOLN
0.5000 mg | INTRAMUSCULAR | Status: DC | PRN
  Administered 2022-10-03: 0.5 mg via INTRAVENOUS
  Filled 2022-10-01: qty 0.5

## 2022-10-01 MED ORDER — LISINOPRIL 20 MG PO TABS: 20.0000 mg | ORAL_TABLET | Freq: Every day | ORAL | Status: DC

## 2022-10-01 MED ORDER — POLYETHYLENE GLYCOL 3350 17 G PO PACK: 17.0000 g | PACK | Freq: Every day | ORAL | Status: DC | PRN

## 2022-10-01 MED ORDER — HYDROCODONE-ACETAMINOPHEN 5-325 MG PO TABS
1.0000 | ORAL_TABLET | Freq: Four times a day (QID) | ORAL | Status: DC | PRN
  Administered 2022-10-03 – 2022-10-05 (×4): 1 via ORAL
  Filled 2022-10-01 (×6): qty 1

## 2022-10-01 MED ORDER — FENTANYL CITRATE PF 50 MCG/ML IJ SOSY
50.0000 ug | PREFILLED_SYRINGE | Freq: Once | INTRAMUSCULAR | Status: DC
  Filled 2022-10-01: qty 1

## 2022-10-01 NOTE — ED Notes (Signed)
Recliner placed in patients room for family members comfort

## 2022-10-01 NOTE — ED Provider Notes (Signed)
[facility] EMERGENCY DEPARTMENT Provider Note   CSN: [id number] Arrival date & time: 10/01/22  1624     History  Chief Complaint  Patient presents with   Fall   Knee Pain    right    Anaiz Shiver Delgreco is a 88 y.o. female.  Patient is a 88 year old who presents after mechanical fall.  She lives with her daughter who provides history.  She does have a history of dementia so history is limited.  She also has a history of hypertension, diabetes and aortic valve replacement per chart review.  She reportedly was getting up from a dining room chair to the sofa and she missed the chair while she was trying to [place].  She fell backward, hitting her head on the ground.  There is no loss of consciousness.  She is not on anticoagulants.  She complains of pain to her right knee and her head.  No other injuries were identified.       Home Medications Prior to Admission medications   Medication Sig Start Date End Date Taking? Authorizing Provider  aspirin EC 81 MG tablet Take 1 tablet (81 mg total) by mouth daily. Swallow whole. 01/21/21   Frann Rider, NP  atorvastatin (LIPITOR) 40 MG tablet Take 1 tablet (40 mg total) by mouth daily. 03/03/22   Lillard Anes, MD  lisinopril (ZESTRIL) 20 MG tablet Take 1 tablet (20 mg total) by mouth daily. 07/21/22   Lillard Anes, MD  metFORMIN (GLUCOPHAGE) 500 MG tablet Take 1 tablet (500 mg total) by mouth daily with breakfast. Take 2 every morning and 1 every evening Patient taking differently: Take 500 mg by mouth daily with breakfast. 07/11/22   CoxElnita Maxwell, MD      Allergies    Cephalosporins    Review of Systems   Review of Systems  Unable to perform ROS: Dementia    Physical Exam Updated Vital Signs BP (!) 165/87   Pulse (!) 101   Temp 97.9 F (36.6 C) (Oral)   Resp 20   SpO2 94%  Physical Exam Constitutional:      Appearance: She is well-developed.  HENT:     Head: Normocephalic.     Comments:  Hematoma to the posterior scalp, no overlying wounds Eyes:     Pupils: Pupils are equal, round, and reactive to light.  Neck:     Comments: No palpable tenderness to the cervical, thoracic or lumbosacral spine Cardiovascular:     Rate and Rhythm: Normal rate and regular rhythm.     Heart sounds: Normal heart sounds.  Pulmonary:     Effort: Pulmonary effort is normal. No respiratory distress.     Breath sounds: Normal breath sounds. No wheezing or rales.  Chest:     Chest wall: Tenderness (Mild tenderness to the left anterior rib's, no crepitus or deformity, no trauma noted) present.  Abdominal:     General: Bowel sounds are normal.     Palpations: Abdomen is soft.     Tenderness: There is no abdominal tenderness. There is no guarding or rebound.  Musculoskeletal:        General: Normal range of motion.     Comments: External rotation of the left hip with tenderness on movement of the left hip.  There is also some mild tenderness to the left knee.  Pedal pulses are intact.  She has some pain on palpation of the right knee.  There is no other pain on palpation or range  of motion of the extremities.  Lymphadenopathy:     Cervical: No cervical adenopathy.  Skin:    General: Skin is warm and dry.     Findings: No rash.  Neurological:     Mental Status: She is alert.     ED Results / Procedures / Treatments   Labs (all labs ordered are listed, but only abnormal results are displayed) Labs Reviewed  BASIC METABOLIC PANEL - Abnormal; Notable for the following components:      Result Value   Chloride 114 (*)    Glucose, Bld 207 (*)    BUN 33 (*)    Calcium 8.7 (*)    All other components within normal limits  CBC WITH DIFFERENTIAL/PLATELET - Abnormal; Notable for the following components:   WBC 23.2 (*)    Hemoglobin 10.7 (*)    HCT 34.1 (*)    RDW 15.6 (*)    Neutro Abs 19.8 (*)    Monocytes Absolute 1.2 (*)    Abs Immature Granulocytes 0.59 (*)    All other components within  normal limits  PROTIME-INR  URINALYSIS, ROUTINE W REFLEX MICROSCOPIC    EKG None  Radiology DG Ribs Unilateral W/Chest Left  Result Date: 10/01/2022 CLINICAL DATA:  Pt to ED via EMS from home c/o fall from dinning room chair. Pt was trying to sit down and missed the chair. Not on blood thinners . No LOC. Now c/o pain to right knee, LT hip/knee and ribs. EXAM: LEFT RIBS AND CHEST - 3+ VIEW COMPARISON:  Chest radiograph, 01/19/2022 FINDINGS: No convincing acute fracture. Old, healed fracture of the anterior left tenth rib. No bone lesion. Previous cardiac surgery and valve replacement. There is opacity at the left lung base consistent with loculated pleural fluid. Lungs otherwise clear. No pneumothorax. IMPRESSION: 1. No acute fracture. 2. No acute cardiopulmonary disease. Electronically Signed   By: Lajean Manes M.D.   On: 10/01/2022 17:38   DG Knee Complete 4 Views Right  Result Date: 10/01/2022 CLINICAL DATA:  Pt to ED via EMS from home c/o fall from dinning room chair. Pt was trying to sit down and missed the chair. Not on blood thinners . No LOC. Now c/o pain to right knee, LT hip/knee and ribs. EXAM: RIGHT KNEE - COMPLETE 4+ VIEW COMPARISON:  None Available. FINDINGS: No fracture. No bone lesion. Skeletal structures are demineralized. Knee joint normally spaced and aligned.  No joint effusion. Posterior arterial vascular calcifications. IMPRESSION: No fracture or dislocation. Electronically Signed   By: Lajean Manes M.D.   On: 10/01/2022 17:37   DG Knee Complete 4 Views Left  Result Date: 10/01/2022 CLINICAL DATA:  Pt to ED via EMS from home c/o fall from dinning room chair. Pt was trying to sit down and missed the chair. Not on blood thinners . No LOC. Now c/o pain to right knee, LT hip/knee and ribs. EXAM: LEFT KNEE - COMPLETE 4+ VIEW COMPARISON:  None Available. FINDINGS: No fracture. No bone lesion. Skeletal structures are demineralized. Knee joint normally spaced and aligned. No  significant arthropathic changes. No joint effusion. Posterior arterial vascular calcifications. IMPRESSION: No fracture or dislocation. Electronically Signed   By: Lajean Manes M.D.   On: 10/01/2022 17:36   DG Hip Unilat W or Wo Pelvis 2-3 Views Left  Result Date: 10/01/2022 CLINICAL DATA:  Pt to ED via EMS from home c/o fall from dinning room chair. Pt was trying to sit down and missed the chair. Not on blood thinners .  No LOC. Now c/o pain to right knee, LT hip/knee and ribs. EXAM: DG HIP (WITH OR WITHOUT PELVIS) 2-3V LEFT COMPARISON:  None Available. FINDINGS: Comminuted displaced intertrochanteric fracture of the proximal left femur. Displacement relatively mild, proximally 1.5 cm. Separate fracture components of the lesser and greater trochanters. Minor varus angulation. No other fractures. There is diffuse sclerosis along the left femoral shaft of unclear etiology and chronicity. No lytic lesions. Remaining skeletal structures are diffusely demineralized. Hip joints, SI joints and symphysis pubis are normally aligned. Extensive arterial vascular calcifications. IMPRESSION: 1. Comminuted, mildly displaced and mildly varus angulated intertrochanteric fracture of the proximal left femur. Electronically Signed   By: Lajean Manes M.D.   On: 10/01/2022 17:35   CT Cervical Spine Wo Contrast  Result Date: 10/01/2022 CLINICAL DATA:  [place] from chair, dimension EXAM: CT CERVICAL SPINE WITHOUT CONTRAST TECHNIQUE: Multidetector CT imaging of the cervical spine was performed without intravenous contrast. Multiplanar CT image reconstructions were also generated. RADIATION DOSE REDUCTION: This exam was performed according to the departmental dose-optimization program which includes automated exposure control, adjustment of the mA and/or kV according to patient size and/or use of iterative reconstruction technique. COMPARISON:  01/19/2022 FINDINGS: Alignment: Alignment is grossly anatomic. Skull base and vertebrae:  No acute fracture. No primary bone lesion or focal pathologic process. Soft tissues and spinal canal: No prevertebral fluid or swelling. No visible canal hematoma. Multinodular goiter again noted and unchanged. No specific imaging follow-up is required. Disc levels: There is diffuse multilevel spondylosis and facet hypertrophy unchanged since prior exam. Upper chest: Airway is patent.  Lung apices are clear. Other: Reconstructed images demonstrate no additional findings. IMPRESSION: 1. No acute cervical spine fracture. Electronically Signed   By: Randa Ngo M.D.   On: 10/01/2022 17:22   CT Head Wo Contrast  Result Date: 10/01/2022 CLINICAL DATA:  [place] from chair, dementia EXAM: CT HEAD WITHOUT CONTRAST TECHNIQUE: Contiguous axial images were obtained from the base of the skull through the vertex without intravenous contrast. RADIATION DOSE REDUCTION: This exam was performed according to the departmental dose-optimization program which includes automated exposure control, adjustment of the mA and/or kV according to patient size and/or use of iterative reconstruction technique. COMPARISON:  08/31/2022 FINDINGS: Brain: Stable chronic small-vessel ischemic changes throughout the periventricular white matter and basal ganglia. Prior left parietal cortical infarct. No acute infarct or hemorrhage. Lateral ventricles are unremarkable. Stable sellar mass previously documented as macro adenoma. No acute extra-axial fluid collections. No mass effect. Vascular: Stable atherosclerosis.  No hyperdense vessel. Skull: Stable erosive changes of the left cavernous sinus and sphenoid sinus related to the sellar mass described above. No acute bony abnormality. Sinuses/Orbits: Stable opacification of the sphenoid sinus. Remaining paranasal sinuses are clear. Other: None. IMPRESSION: 1. No acute intracranial process. 2. Stable chronic ischemic changes as above. 3. Stable sellar mass with erosive changes into the left cavernous  sinus and sphenoid sinus, compatible macro adenoma based on previous imaging. Electronically Signed   By: Randa Ngo M.D.   On: 10/01/2022 17:20    Procedures Procedures    Medications Ordered in ED Medications  fentaNYL (SUBLIMAZE) injection 50 mcg (50 mcg Intravenous Patient Refused/Not Given 10/01/22 1855)    ED Course/ Medical Decision Making/ A&P                           Medical Decision Making Amount and/or Complexity of Data Reviewed Labs: ordered. Radiology: ordered.  Risk Prescription drug management.  Decision regarding hospitalization.   Patient is a 88 year old female who presents after mechanical fall.  She mostly has pain in her left hip.  X-rays reveal evidence of an intertrochanteric comminuted fracture of the left hip.  This was interpreted by me and confirmed by the radiologist.  Other imaging studies do not reveal other fractures or concerns.  Chest x-ray  does not show any evidence of pneumonia.  Her labs are reviewed.  Her WBC count is elevated.  However she is afebrile.  Her daughter advises that she is at her normal behavior and mental status.  Urinalysis is pending.  I discussed the patient with Dr. Lucia Gaskins with orthopedic surgery who will admit the patient.  He request patient to be admitted to [facility] only.  I spoke with Dr. Nevada Crane who will admit the patient to [facility].  Final Clinical Impression(s) / ED Diagnoses Final diagnoses:  Fall, initial encounter  Closed fracture of left hip, initial encounter [facility])  Injury of head, initial encounter    Rx / DC Orders ED Discharge Orders     None         Malvin Johns, MD 10/01/22 1934

## 2022-10-01 NOTE — ED Notes (Signed)
Spoke with Roselyn Reef @ [facility] regarding Ortho Surgery consult

## 2022-10-01 NOTE — ED Notes (Signed)
Pt placed on purewick to collect urine sample. Will continue to monitor.

## 2022-10-01 NOTE — Plan of Care (Signed)
  Problem: Clinical Measurements: Goal: Ability to maintain clinical measurements within normal limits will improve Outcome: Progressing   Problem: Coping: Goal: Level of anxiety will decrease Outcome: Progressing   Problem: Pain Managment: Goal: General experience of comfort will improve Outcome: Progressing   

## 2022-10-01 NOTE — ED Notes (Signed)
Spoke with [facility] for hospitalist consult

## 2022-10-01 NOTE — H&P (Signed)
History and Physical   Christine Newton Needs UXN:[id number] DOB: 02-25-1933 DOA: 10/01/2022  PCP: Lillard Anes, MD   Patient coming from: Home  Chief Complaint: Fall  HPI: Christine Newton is a 88 y.o. female with medical history significant of hypertension, hyperlipidemia, the aortic stenosis status post aortic valve repair, history of TIA, dementia, diabetes presenting after a fall at home.  Due to patient's dementia, history obtained assistance of chart review and family.  Patient was reportedly getting up from the dining room chair to go to the sofa and missed the step as she tried to sit down and then fell and hit her head and bottom.  Denies any loss of consciousness no using any blood thinners.  Patient reported pain at her right knee and head.  She denies fevers, chills, chest pain, shortness of breath, Donnell pain, constipation, diarrhea, nausea, vomiting.  ED Course: Vital signs in ED significant for heart rate in the 90s to 100s, blood pressure in the 254Y to 706 systolic.  Lab work-up included BMP with chloride 114, BUN 33, glucose 207, calcium 8.7.  CBC with hemoglobin stable at 10.7 and leukocytosis 23.2.  PT and INR within normal limits.  Urinalysis pending.  Chest x-ray and rib x-ray showed no acute normality.  Right and left knee x-rays show no acute abnormalities.  Left hip x-ray showed intertrochanteric fracture of the left proximal femur with mild displacement and minimal angulation.  CT head showed no acute abnormality but did show stable sellar mass.  CT C-spine showed no acute normality.  Patient received fentanyl in the ED.  Orthopedic surgery consulted in the ED recommend n.p.o. at midnight and will see tomorrow.  Review of Systems: As per HPI otherwise all other systems reviewed and are negative.  Past Medical History:  Diagnosis Date   Diabetes ([place])    Hypertension    Nonrheumatic aortic (valve) stenosis 09/21/2020    Past Surgical History:   Procedure Laterality Date   CARDIAC VALVE REPLACEMENT     CHOLECYSTECTOMY      Social History  reports that she has never smoked. She has never used smokeless tobacco. She reports that she does not drink alcohol and does not use drugs.  Allergies  Allergen Reactions   Cephalosporins Nausea Only    No family history on file.  Prior to Admission medications   Medication Sig Start Date End Date Taking? Authorizing Provider  aspirin EC 81 MG tablet Take 1 tablet (81 mg total) by mouth daily. Swallow whole. 01/21/21   Frann Rider, NP  atorvastatin (LIPITOR) 40 MG tablet Take 1 tablet (40 mg total) by mouth daily. 03/03/22   Lillard Anes, MD  lisinopril (ZESTRIL) 20 MG tablet Take 1 tablet (20 mg total) by mouth daily. 07/21/22   Lillard Anes, MD  metFORMIN (GLUCOPHAGE) 500 MG tablet Take 1 tablet (500 mg total) by mouth daily with breakfast. Take 2 every morning and 1 every evening Patient taking differently: Take 500 mg by mouth daily with breakfast. 07/11/22   Rochel Brome, MD    Physical Exam: Vitals:   10/01/22 1730 10/01/22 1900 10/01/22 2000 10/01/22 2056  BP: (!) 180/79 (!) 165/87 (!) 144/82   Pulse: 94 (!) 101    Resp: '19 20 19   '$ Temp:    98.4 F (36.9 C)  TempSrc:    Oral  SpO2: 95% 94%      Physical Exam Constitutional:      General: She is not in acute distress.  Appearance: Normal appearance.  HENT:     Head: Normocephalic and atraumatic.     Mouth/Throat:     Mouth: Mucous membranes are moist.     Pharynx: Oropharynx is clear.  Eyes:     Extraocular Movements: Extraocular movements intact.     Pupils: Pupils are equal, round, and reactive to light.  Cardiovascular:     Rate and Rhythm: Normal rate and regular rhythm.     Pulses: Normal pulses.     Heart sounds: Normal heart sounds.  Pulmonary:     Effort: Pulmonary effort is normal. No respiratory distress.     Breath sounds: Normal breath sounds.  Abdominal:     General: Bowel  sounds are normal. There is no distension.     Palpations: Abdomen is soft.     Tenderness: There is no abdominal tenderness.  Musculoskeletal:        General: No swelling or deformity.     Comments: Bilateral lower extremities neurovascular intact.  Left lower extremity shorted and externally rotated.  Skin:    General: Skin is warm and dry.  Neurological:     General: No focal deficit present.     Mental Status: Mental status is at baseline.    Labs on Admission: I have personally reviewed following labs and imaging studies  CBC: Recent Labs  Lab 10/01/22 1755  WBC 23.2*  NEUTROABS 19.8*  HGB 10.7*  HCT 34.1*  MCV 87.4  PLT 361    Basic Metabolic Panel: Recent Labs  Lab 10/01/22 1755  NA 143  K 3.5  CL 114*  CO2 22  GLUCOSE 207*  BUN 33*  CREATININE 0.79  CALCIUM 8.7*    GFR: Estimated Creatinine Clearance: 46.4 mL/min (by C-G formula based on SCr of 0.79 mg/dL).  Liver Function Tests: No results for input(s): "AST", "ALT", "ALKPHOS", "BILITOT", "PROT", "ALBUMIN" in the last 168 hours.  Urine analysis:    Component Value Date/Time   COLORURINE YELLOW 08/31/2022 2212   APPEARANCEUR CLEAR 08/31/2022 2212   LABSPEC 1.038 (H) 08/31/2022 2212   PHURINE 6.0 08/31/2022 2212   GLUCOSEU NEGATIVE 08/31/2022 2212   HGBUR NEGATIVE 08/31/2022 2212   BILIRUBINUR NEGATIVE 08/31/2022 2212   BILIRUBINUR negative 03/01/2022 1206   BILIRUBINUR Negative 11/01/2021 1152   KETONESUR NEGATIVE 08/31/2022 2212   PROTEINUR NEGATIVE 08/31/2022 2212   UROBILINOGEN 1.0 03/01/2022 1206   NITRITE NEGATIVE 08/31/2022 2212   LEUKOCYTESUR TRACE (A) 08/31/2022 2212    Radiological Exams on Admission: DG Ribs Unilateral W/Chest Left  Result Date: 10/01/2022 CLINICAL DATA:  Pt to ED via EMS from home c/o fall from dinning room chair. Pt was trying to sit down and missed the chair. Not on blood thinners . No LOC. Now c/o pain to right knee, LT hip/knee and ribs. EXAM: LEFT RIBS AND  CHEST - 3+ VIEW COMPARISON:  Chest radiograph, 01/19/2022 FINDINGS: No convincing acute fracture. Old, healed fracture of the anterior left tenth rib. No bone lesion. Previous cardiac surgery and valve replacement. There is opacity at the left lung base consistent with loculated pleural fluid. Lungs otherwise clear. No pneumothorax. IMPRESSION: 1. No acute fracture. 2. No acute cardiopulmonary disease. Electronically Signed   By: Lajean Manes M.D.   On: 10/01/2022 17:38   DG Knee Complete 4 Views Right  Result Date: 10/01/2022 CLINICAL DATA:  Pt to ED via EMS from home c/o fall from dinning room chair. Pt was trying to sit down and missed the chair. Not on blood thinners .  No LOC. Now c/o pain to right knee, LT hip/knee and ribs. EXAM: RIGHT KNEE - COMPLETE 4+ VIEW COMPARISON:  None Available. FINDINGS: No fracture. No bone lesion. Skeletal structures are demineralized. Knee joint normally spaced and aligned.  No joint effusion. Posterior arterial vascular calcifications. IMPRESSION: No fracture or dislocation. Electronically Signed   By: Lajean Manes M.D.   On: 10/01/2022 17:37   DG Knee Complete 4 Views Left  Result Date: 10/01/2022 CLINICAL DATA:  Pt to ED via EMS from home c/o fall from dinning room chair. Pt was trying to sit down and missed the chair. Not on blood thinners . No LOC. Now c/o pain to right knee, LT hip/knee and ribs. EXAM: LEFT KNEE - COMPLETE 4+ VIEW COMPARISON:  None Available. FINDINGS: No fracture. No bone lesion. Skeletal structures are demineralized. Knee joint normally spaced and aligned. No significant arthropathic changes. No joint effusion. Posterior arterial vascular calcifications. IMPRESSION: No fracture or dislocation. Electronically Signed   By: Lajean Manes M.D.   On: 10/01/2022 17:36   DG Hip Unilat W or Wo Pelvis 2-3 Views Left  Result Date: 10/01/2022 CLINICAL DATA:  Pt to ED via EMS from home c/o fall from dinning room chair. Pt was trying to sit down and  missed the chair. Not on blood thinners . No LOC. Now c/o pain to right knee, LT hip/knee and ribs. EXAM: DG HIP (WITH OR WITHOUT PELVIS) 2-3V LEFT COMPARISON:  None Available. FINDINGS: Comminuted displaced intertrochanteric fracture of the proximal left femur. Displacement relatively mild, proximally 1.5 cm. Separate fracture components of the lesser and greater trochanters. Minor varus angulation. No other fractures. There is diffuse sclerosis along the left femoral shaft of unclear etiology and chronicity. No lytic lesions. Remaining skeletal structures are diffusely demineralized. Hip joints, SI joints and symphysis pubis are normally aligned. Extensive arterial vascular calcifications. IMPRESSION: 1. Comminuted, mildly displaced and mildly varus angulated intertrochanteric fracture of the proximal left femur. Electronically Signed   By: Lajean Manes M.D.   On: 10/01/2022 17:35   CT Cervical Spine Wo Contrast  Result Date: 10/01/2022 CLINICAL DATA:  [place] from chair, dimension EXAM: CT CERVICAL SPINE WITHOUT CONTRAST TECHNIQUE: Multidetector CT imaging of the cervical spine was performed without intravenous contrast. Multiplanar CT image reconstructions were also generated. RADIATION DOSE REDUCTION: This exam was performed according to the departmental dose-optimization program which includes automated exposure control, adjustment of the mA and/or kV according to patient size and/or use of iterative reconstruction technique. COMPARISON:  01/19/2022 FINDINGS: Alignment: Alignment is grossly anatomic. Skull base and vertebrae: No acute fracture. No primary bone lesion or focal pathologic process. Soft tissues and spinal canal: No prevertebral fluid or swelling. No visible canal hematoma. Multinodular goiter again noted and unchanged. No specific imaging follow-up is required. Disc levels: There is diffuse multilevel spondylosis and facet hypertrophy unchanged since prior exam. Upper chest: Airway is patent.   Lung apices are clear. Other: Reconstructed images demonstrate no additional findings. IMPRESSION: 1. No acute cervical spine fracture. Electronically Signed   By: Randa Ngo M.D.   On: 10/01/2022 17:22   CT Head Wo Contrast  Result Date: 10/01/2022 CLINICAL DATA:  [place] from chair, dementia EXAM: CT HEAD WITHOUT CONTRAST TECHNIQUE: Contiguous axial images were obtained from the base of the skull through the vertex without intravenous contrast. RADIATION DOSE REDUCTION: This exam was performed according to the departmental dose-optimization program which includes automated exposure control, adjustment of the mA and/or kV according to patient size and/or use  of iterative reconstruction technique. COMPARISON:  08/31/2022 FINDINGS: Brain: Stable chronic small-vessel ischemic changes throughout the periventricular white matter and basal ganglia. Prior left parietal cortical infarct. No acute infarct or hemorrhage. Lateral ventricles are unremarkable. Stable sellar mass previously documented as macro adenoma. No acute extra-axial fluid collections. No mass effect. Vascular: Stable atherosclerosis.  No hyperdense vessel. Skull: Stable erosive changes of the left cavernous sinus and sphenoid sinus related to the sellar mass described above. No acute bony abnormality. Sinuses/Orbits: Stable opacification of the sphenoid sinus. Remaining paranasal sinuses are clear. Other: None. IMPRESSION: 1. No acute intracranial process. 2. Stable chronic ischemic changes as above. 3. Stable sellar mass with erosive changes into the left cavernous sinus and sphenoid sinus, compatible macro adenoma based on previous imaging. Electronically Signed   By: Randa Ngo M.D.   On: 10/01/2022 17:20    EKG: Not performed in the ED.  Assessment/Plan Principal Problem:   Closed left femoral fracture (HCC) Active Problems:   Essential hypertension   Hyperlipidemia   S/P AVR (aortic valve replacement)   History of TIA (transient  ischemic attack)   Dementia (HCC)   Non-insulin dependent type 2 diabetes mellitus ([place])   Fall Left intratrochanteric hip fracture > Patient fell when attempting to sit at home.  Landed on her bottom and then hit her head.  No acute abnormality on CT head or CT C-spine.  Bilateral knee films and chest x-ray also clear.  Hip imaging showed a left-sided intertrochanteric fracture of the left proximal femur with mild displacement and minimal angulation. > Orthopedics consulted in the ED and recommended evaluation at [facility] and n.p.o. at midnight. - Continue to monitor on telemetry - Appreciate orthopedics recommendations - Continue as needed pain control with Norco for moderate to severe pain and Dilaudid for severe breakthrough pain - Consult to anesthesia for nerve block - N.p.o. at midnight - Hip fracture precautions  Leukocytosis > WBC significantly elevated to 23 in the ED however no evidence of infection with normal imaging and urinalysis pending as well as normal behavior and mentation per family. > Present reactive at this time - Trend fever curve and WBC  Hypertension - Continue home lisinopril  Hyperlipidemia - Continue home atorvastatin  History of TIA - Holding home aspirin - Continue home atorvastatin  Diabetes - SSI  Dementia Aortic stenosis status post aortic valve repair > Daughter notes history of patient becoming confused when in the hospital not knowing where she is and pulling on lines at times if she becomes delirious. - Noted  DVT prophylaxis: SCDs Code Status:   DNR Family Communication:  Daughter called and updated by phone. Disposition Plan:   Patient is from:  Home  Anticipated DC to:  Pending clinical course  Anticipated DC date:  2 to 4 days  Anticipated DC barriers: Possible need for placement  Consults called:  Orthopedic surgery Admission status:  Inpatient, telemetry  Severity of Illness: The appropriate patient status for this  patient is INPATIENT. Inpatient status is judged to be reasonable and necessary in order to provide the required intensity of service to ensure the patient's safety. The patient's presenting symptoms, physical exam findings, and initial radiographic and laboratory data in the context of their chronic comorbidities is felt to place them at high risk for further clinical deterioration. Furthermore, it is not anticipated that the patient will be medically stable for discharge from the hospital within 2 midnights of admission.   * I certify that at the point of  admission it is my clinical judgment that the patient will require inpatient hospital care spanning beyond 2 midnights from the point of admission due to high intensity of service, high risk for further deterioration and high frequency of surveillance required.Marcelyn Bruins MD [facility]  How to contact the [facility] Attending or Consulting provider [place] or covering provider during after hours [place], for this patient?   Check the care team in [facility] and look for a) attending/consulting [facility] provider listed and b) the [facility] team listed Log into [email or web address] and use [place]'s universal password to access. If you do not have the password, please contact the hospital operator. Locate the [facility] provider you are looking for under [facility] and page to a number that you can be directly reached. If you still have difficulty reaching the provider, please page the [facility] (Director on Call) for the Hospitalists listed on amion for assistance.  10/01/2022, 10:44 PM

## 2022-10-01 NOTE — ED Triage Notes (Signed)
Pt to ED via EMS from home c/o fall from dinning room chair. Pt was trying to sit down and missed the chair. Not on blood thinners . No LOC. Now c/o pain to right knee. Pt has hx dementia, orientation at baseline.

## 2022-10-02 ENCOUNTER — Inpatient Hospital Stay (HOSPITAL_COMMUNITY): Payer: Medicare PPO | Admitting: Certified Registered Nurse Anesthetist

## 2022-10-02 ENCOUNTER — Encounter (HOSPITAL_COMMUNITY): Payer: Self-pay | Admitting: Internal Medicine

## 2022-10-02 ENCOUNTER — Inpatient Hospital Stay (HOSPITAL_COMMUNITY): Payer: Medicare PPO

## 2022-10-02 ENCOUNTER — Encounter (HOSPITAL_COMMUNITY)
Admission: EM | Disposition: A | Discharge status: Skilled Nursing Facility | Payer: Self-pay | Source: Home / Self Care | Attending: Internal Medicine

## 2022-10-02 DIAGNOSIS — S72142A Displaced intertrochanteric fracture of left femur, initial encounter for closed fracture: Secondary | ICD-10-CM

## 2022-10-02 DIAGNOSIS — I10 Essential (primary) hypertension: Secondary | ICD-10-CM | POA: Diagnosis not present

## 2022-10-02 DIAGNOSIS — S72002A Fracture of unspecified part of neck of left femur, initial encounter for closed fracture: Secondary | ICD-10-CM | POA: Diagnosis not present

## 2022-10-02 DIAGNOSIS — E1159 Type 2 diabetes mellitus with other circulatory complications: Secondary | ICD-10-CM

## 2022-10-02 DIAGNOSIS — Z7984 Long term (current) use of oral hypoglycemic drugs: Secondary | ICD-10-CM | POA: Diagnosis not present

## 2022-10-02 HISTORY — PX: INTRAMEDULLARY (IM) NAIL INTERTROCHANTERIC: SHX5875

## 2022-10-02 HISTORY — DX: Fracture of unspecified part of neck of left femur, initial encounter for closed fracture: S72.002A

## 2022-10-02 LAB — GLUCOSE, CAPILLARY
Glucose-Capillary: 154 mg/dL — ABNORMAL HIGH (ref 70–99)
Glucose-Capillary: 158 mg/dL — ABNORMAL HIGH (ref 70–99)
Glucose-Capillary: 120 mg/dL — ABNORMAL HIGH (ref 70–99)
Glucose-Capillary: 140 mg/dL — ABNORMAL HIGH (ref 70–99)
Glucose-Capillary: 165 mg/dL — ABNORMAL HIGH (ref 70–99)
Glucose-Capillary: 159 mg/dL — ABNORMAL HIGH (ref 70–99)

## 2022-10-02 LAB — CBC
HCT: 32.9 % — ABNORMAL LOW (ref 36.0–46.0)
Hemoglobin: 10.6 g/dL — ABNORMAL LOW (ref 12.0–15.0)
MCH: 28.2 pg (ref 26.0–34.0)
MCHC: 32.2 g/dL (ref 30.0–36.0)
MCV: 87.5 fL (ref 80.0–100.0)
Platelets: 240 10*3/uL (ref 150–400)
RBC: 3.76 MIL/uL — ABNORMAL LOW (ref 3.87–5.11)
RDW: 15.5 % (ref 11.5–15.5)
WBC: 22.2 10*3/uL — ABNORMAL HIGH (ref 4.0–10.5)
nRBC: 0 % (ref 0.0–0.2)

## 2022-10-02 LAB — CK: Total CK: 67 U/L (ref 38–234)

## 2022-10-02 LAB — BASIC METABOLIC PANEL
Anion gap: 9 (ref 5–15)
BUN: 25 mg/dL — ABNORMAL HIGH (ref 8–23)
CO2: 23 mmol/L (ref 22–32)
Calcium: 9 mg/dL (ref 8.9–10.3)
Chloride: 109 mmol/L (ref 98–111)
Creatinine, Ser: 0.83 mg/dL (ref 0.44–1.00)
GFR, Estimated: >60 mL/min (ref >60)
Glucose, Bld: 159 mg/dL — ABNORMAL HIGH (ref 70–99)
Potassium: 3.6 mmol/L (ref 3.5–5.1)
Sodium: 141 mmol/L (ref 135–145)

## 2022-10-02 LAB — PROCALCITONIN: Procalcitonin: <0.1 ng/mL

## 2022-10-02 LAB — VITAMIN D 25 HYDROXY (VIT D DEFICIENCY, FRACTURES): Vit D, 25-Hydroxy: 33.52 ng/mL (ref 30–100)

## 2022-10-02 SURGERY — FIXATION, FRACTURE, INTERTROCHANTERIC, WITH INTRAMEDULLARY ROD
Anesthesia: General | Site: Hip | Laterality: Left

## 2022-10-02 MED ORDER — TRANEXAMIC ACID-NACL 1000-0.7 MG/100ML-% IV SOLN
INTRAVENOUS | Status: AC
  Filled 2022-10-02: qty 100

## 2022-10-02 MED ORDER — SODIUM CHLORIDE 0.9 % IV BOLUS
250.0000 mL | Freq: Once | INTRAVENOUS | Status: AC
  Administered 2022-10-02: 250 mL via INTRAVENOUS

## 2022-10-02 MED ORDER — DEXAMETHASONE SODIUM PHOSPHATE 10 MG/ML IJ SOLN
INTRAMUSCULAR | Status: DC | PRN
  Administered 2022-10-02: 4 mg via INTRAVENOUS

## 2022-10-02 MED ORDER — SUCCINYLCHOLINE CHLORIDE 200 MG/10ML IV SOSY
PREFILLED_SYRINGE | INTRAVENOUS | Status: DC | PRN
  Administered 2022-10-02: 140 mg via INTRAVENOUS

## 2022-10-02 MED ORDER — FENTANYL CITRATE (PF) 250 MCG/5ML IJ SOLN
INTRAMUSCULAR | Status: AC
  Filled 2022-10-02: qty 5

## 2022-10-02 MED ORDER — INSULIN ASPART 100 UNIT/ML IJ SOLN: 0.0000 [IU] | INTRAMUSCULAR | Status: DC | PRN

## 2022-10-02 MED ORDER — BUDESONIDE 0.5 MG/2ML IN SUSP
0.5000 mg | Freq: Two times a day (BID) | RESPIRATORY_TRACT | Status: DC
  Administered 2022-10-02 – 2022-10-07 (×9): 0.5 mg via RESPIRATORY_TRACT
  Filled 2022-10-02 (×9): qty 2

## 2022-10-02 MED ORDER — FENTANYL CITRATE (PF) 250 MCG/5ML IJ SOLN
INTRAMUSCULAR | Status: DC | PRN
  Administered 2022-10-02 (×3): 50 ug via INTRAVENOUS

## 2022-10-02 MED ORDER — ONDANSETRON HCL 4 MG/2ML IJ SOLN
4.0000 mg | Freq: Four times a day (QID) | INTRAMUSCULAR | Status: DC | PRN
  Administered 2022-10-03: 4 mg via INTRAVENOUS
  Filled 2022-10-02: qty 2

## 2022-10-02 MED ORDER — CEFAZOLIN SODIUM-DEXTROSE 2-3 GM-%(50ML) IV SOLR
INTRAVENOUS | Status: DC | PRN
  Administered 2022-10-02: 2 g via INTRAVENOUS

## 2022-10-02 MED ORDER — SUGAMMADEX SODIUM 200 MG/2ML IV SOLN
INTRAVENOUS | Status: DC | PRN
  Administered 2022-10-02: 200 mg via INTRAVENOUS

## 2022-10-02 MED ORDER — CHLORHEXIDINE GLUCONATE 0.12 % MT SOLN
OROMUCOSAL | Status: AC
  Administered 2022-10-02: 15 mL via OROMUCOSAL
  Filled 2022-10-02: qty 15

## 2022-10-02 MED ORDER — SENNOSIDES-DOCUSATE SODIUM 8.6-50 MG PO TABS: 1.0000 | ORAL_TABLET | Freq: Every evening | ORAL | Status: DC | PRN

## 2022-10-02 MED ORDER — 0.9 % SODIUM CHLORIDE (POUR BTL) OPTIME
TOPICAL | Status: DC | PRN
  Administered 2022-10-02: 1000 mL

## 2022-10-02 MED ORDER — TRANEXAMIC ACID-NACL 1000-0.7 MG/100ML-% IV SOLN
INTRAVENOUS | Status: DC | PRN
  Administered 2022-10-02: 1000 mg via INTRAVENOUS

## 2022-10-02 MED ORDER — PHENYLEPHRINE 80 MCG/ML (10ML) SYRINGE FOR IV PUSH (FOR BLOOD PRESSURE SUPPORT)
PREFILLED_SYRINGE | INTRAVENOUS | Status: DC | PRN
  Administered 2022-10-02: 80 ug via INTRAVENOUS
  Administered 2022-10-02: 120 ug via INTRAVENOUS
  Administered 2022-10-02 (×2): 40 ug via INTRAVENOUS
  Administered 2022-10-02: 80 ug via INTRAVENOUS
  Administered 2022-10-02: 160 ug via INTRAVENOUS
  Administered 2022-10-02: 80 ug via INTRAVENOUS

## 2022-10-02 MED ORDER — GUAIFENESIN 100 MG/5ML PO LIQD: 5.0000 mL | ORAL | Status: DC | PRN

## 2022-10-02 MED ORDER — IPRATROPIUM-ALBUTEROL 0.5-2.5 (3) MG/3ML IN SOLN: 3.0000 mL | RESPIRATORY_TRACT | Status: DC | PRN

## 2022-10-02 MED ORDER — ONDANSETRON HCL 4 MG/2ML IJ SOLN
INTRAMUSCULAR | Status: DC | PRN
  Administered 2022-10-02: 4 mg via INTRAVENOUS

## 2022-10-02 MED ORDER — DEXAMETHASONE SODIUM PHOSPHATE 10 MG/ML IJ SOLN
INTRAMUSCULAR | Status: AC
  Filled 2022-10-02: qty 1

## 2022-10-02 MED ORDER — SUCCINYLCHOLINE CHLORIDE 200 MG/10ML IV SOSY
PREFILLED_SYRINGE | INTRAVENOUS | Status: AC
  Filled 2022-10-02: qty 20

## 2022-10-02 MED ORDER — TRAZODONE HCL 50 MG PO TABS: 50.0000 mg | ORAL_TABLET | Freq: Every evening | ORAL | Status: DC | PRN

## 2022-10-02 MED ORDER — ROCURONIUM BROMIDE 10 MG/ML (PF) SYRINGE
PREFILLED_SYRINGE | INTRAVENOUS | Status: AC
  Filled 2022-10-02: qty 10

## 2022-10-02 MED ORDER — PROPOFOL 10 MG/ML IV BOLUS
INTRAVENOUS | Status: AC
  Filled 2022-10-02: qty 20

## 2022-10-02 MED ORDER — CEFAZOLIN SODIUM-DEXTROSE 2-4 GM/100ML-% IV SOLN
INTRAVENOUS | Status: AC
  Filled 2022-10-02: qty 100

## 2022-10-02 MED ORDER — FENTANYL CITRATE (PF) 100 MCG/2ML IJ SOLN: 25.0000 ug | INTRAMUSCULAR | Status: DC | PRN

## 2022-10-02 MED ORDER — ROCURONIUM BROMIDE 10 MG/ML (PF) SYRINGE
PREFILLED_SYRINGE | INTRAVENOUS | Status: DC | PRN
  Administered 2022-10-02: 50 mg via INTRAVENOUS

## 2022-10-02 MED ORDER — HYDRALAZINE HCL 20 MG/ML IJ SOLN: 10.0000 mg | INTRAMUSCULAR | Status: DC | PRN

## 2022-10-02 MED ORDER — ALBUMIN HUMAN 5 % IV SOLN: INTRAVENOUS | Status: DC | PRN

## 2022-10-02 MED ORDER — LACTATED RINGERS IV SOLN: INTRAVENOUS | Status: DC

## 2022-10-02 MED ORDER — PHENYLEPHRINE 80 MCG/ML (10ML) SYRINGE FOR IV PUSH (FOR BLOOD PRESSURE SUPPORT)
PREFILLED_SYRINGE | INTRAVENOUS | Status: AC
  Filled 2022-10-02: qty 20

## 2022-10-02 MED ORDER — METOPROLOL TARTRATE 5 MG/5ML IV SOLN
5.0000 mg | INTRAVENOUS | Status: DC | PRN
  Administered 2022-10-03 – 2022-10-04 (×3): 5 mg via INTRAVENOUS
  Filled 2022-10-02 (×4): qty 5

## 2022-10-02 MED ORDER — ONDANSETRON HCL 4 MG/2ML IJ SOLN
INTRAMUSCULAR | Status: AC
  Filled 2022-10-02: qty 2

## 2022-10-02 MED ORDER — LIDOCAINE 2% (20 MG/ML) 5 ML SYRINGE
INTRAMUSCULAR | Status: AC
  Filled 2022-10-02: qty 5

## 2022-10-02 MED ORDER — DOCUSATE SODIUM 100 MG PO CAPS
100.0000 mg | ORAL_CAPSULE | Freq: Two times a day (BID) | ORAL | Status: DC
  Administered 2022-10-03 – 2022-10-07 (×9): 100 mg via ORAL
  Filled 2022-10-02 (×9): qty 1

## 2022-10-02 MED ORDER — PROPOFOL 10 MG/ML IV BOLUS
INTRAVENOUS | Status: DC | PRN
  Administered 2022-10-02: 80 mg via INTRAVENOUS

## 2022-10-02 MED ORDER — LIDOCAINE 2% (20 MG/ML) 5 ML SYRINGE
INTRAMUSCULAR | Status: DC | PRN
  Administered 2022-10-02 (×2): 50 mg via INTRAVENOUS

## 2022-10-02 MED ORDER — ORAL CARE MOUTH RINSE: 15.0000 mL | Freq: Once | OROMUCOSAL | Status: AC

## 2022-10-02 MED ORDER — CHLORHEXIDINE GLUCONATE 0.12 % MT SOLN: 15.0000 mL | Freq: Once | OROMUCOSAL | Status: AC

## 2022-10-02 SURGICAL SUPPLY — 40 items
APL PRP STRL LF DISP 70% ISPRP (MISCELLANEOUS) ×1
BAG COUNTER SPONGE SURGICOUNT (BAG) IMPLANT
BAG SPNG CNTER NS LX DISP (BAG)
BIT DRILL CROWE POINT TWST 4.3 (DRILL) IMPLANT
BIT DRILL LAG SCREW (DRILL) IMPLANT
CHLORAPREP W/TINT 26 (MISCELLANEOUS) ×1 IMPLANT
COVER PERINEAL POST (MISCELLANEOUS) ×1 IMPLANT
COVER SURGICAL LIGHT HANDLE (MISCELLANEOUS) ×1 IMPLANT
DRAPE C-ARMOR (DRAPES) ×1 IMPLANT
DRAPE STERI IOBAN 125X83 (DRAPES) ×1 IMPLANT
DRESSING MEPILEX FLEX 4X4 (GAUZE/BANDAGES/DRESSINGS) ×2 IMPLANT
DRILL CROWE POINT TWIST 4.3 (DRILL) ×1
DRILL LAG SCREW (DRILL) ×1
DRSG MEPILEX BORDER 4X8 (GAUZE/BANDAGES/DRESSINGS) IMPLANT
DRSG MEPILEX FLEX 4X4 (GAUZE/BANDAGES/DRESSINGS) ×2
DRSG MEPILEX POST OP 4X8 (GAUZE/BANDAGES/DRESSINGS) IMPLANT
ELECT REM PT RETURN 15FT ADLT (MISCELLANEOUS) ×1 IMPLANT
GAUZE XEROFORM 5X9 LF (GAUZE/BANDAGES/DRESSINGS) ×1 IMPLANT
GLOVE BIOGEL PI IND STRL 6.5 (GLOVE) ×1 IMPLANT
GLOVE BIOGEL PI IND STRL 8 (GLOVE) ×1 IMPLANT
GLOVE ECLIPSE 6.0 STRL STRAW (GLOVE) ×1 IMPLANT
GLOVE ECLIPSE 8.0 STRL XLNG CF (GLOVE) ×1 IMPLANT
GOWN STRL REUS W/ TWL LRG LVL3 (GOWN DISPOSABLE) ×2 IMPLANT
GOWN STRL REUS W/TWL LRG LVL3 (GOWN DISPOSABLE) ×2
GUIDEPIN VERSANAIL DSP 3.2X444 (ORTHOPEDIC DISPOSABLE SUPPLIES) IMPLANT
GUIDEWIRE BALL NOSE 100CM (WIRE) IMPLANT
KIT BASIN OR (CUSTOM PROCEDURE TRAY) ×1 IMPLANT
KIT TURNOVER KIT A (KITS) IMPLANT
NAIL IM AFFIXUS 11X380 125D LT (Nail) IMPLANT
NS IRRIG 1000ML POUR BTL (IV SOLUTION) ×1 IMPLANT
PACK GENERAL/GYN (CUSTOM PROCEDURE TRAY) ×1 IMPLANT
SCREW BONE CORTICAL 5.0X40 (Screw) IMPLANT
SCREW BONE CORTICAL 5.0X42 (Screw) IMPLANT
SCREW LAG HIP NAIL 10.5X95 (Screw) IMPLANT
STAPLER VISISTAT 35W (STAPLE) ×1 IMPLANT
SUT VIC AB 0 CT1 36 (SUTURE) ×1 IMPLANT
SUT VIC AB 1 CT1 36 (SUTURE) ×1 IMPLANT
SUT VIC AB 2-0 CT1 27 (SUTURE) ×1
SUT VIC AB 2-0 CT1 TAPERPNT 27 (SUTURE) ×1 IMPLANT
TOWEL OR 17X26 10 PK STRL BLUE (TOWEL DISPOSABLE) ×1 IMPLANT

## 2022-10-02 NOTE — Progress Notes (Signed)
PROGRESS NOTE    Christine Newton  ASN:[id number] DOB: [birth year] DOA: 10/01/2022 PCP: Lillard Anes, MD   Brief Narrative:  87 year old with history of HTN, HLD, aortic stenosis status post aortic valve repair, TIA, dementia, DM 2 presented after a fall at home.  Poor historian due to dementia.  Denies any loss of consciousness.  X-ray of the left hip showed intertrochanteric left femur fracture.  CT head and cervical spine did not show any acute pathology.  Orthopedic consulted for surgery.   Assessment & Plan:  Principal Problem:   Closed left femoral fracture (HCC) Active Problems:   Essential hypertension   Hyperlipidemia   S/P AVR (aortic valve replacement)   History of TIA (transient ischemic attack)   Dementia (HCC)   Non-insulin dependent type 2 diabetes mellitus ([place])    Fall Left intratrochanteric hip fracture -Secondary to fall at home.  CT head and cervical spine negative for acute pathology.  Orthopedic consulted for operative management hopefully today.  Currently NPO.  Postop management per orthopedic including pain medications, weightbearing precautions and DVT prophylaxis.  Eventually will need PT/OT -Check CK   Leukocytosis -Suspect reactive in nature.  No obvious evidence of infection.  Will check UA and procalcitonin   Hypertension -On home lisinopril.  IV as needed ordered   Hyperlipidemia -Lipitor   History of TIA -Aspirin on hold.  Continue statin   Diabetes -Sliding scale and Accu-Cheks   Dementia Aortic stenosis status post aortic valve repair -Supportive care   DVT prophylaxis: Postop prophylaxis per orthopedic Code Status: DNR Family Communication:  Called Vikki- updated.   Status is: Inpatient Plans for operative management per orthopedic today.  Subjective: Seen and examined at bedside in preop area.  Does not have any complaints besides hip discomfort.   Examination:  General exam: Appears calm and comfortable   Respiratory system: Clear to auscultation. Respiratory effort normal. Cardiovascular system: S1 & S2 heard, RRR. No JVD, murmurs, rubs, gallops or clicks. No pedal edema. Gastrointestinal system: Abdomen is nondistended, soft and nontender. No organomegaly or masses felt. Normal bowel sounds heard. Central nervous system: Alert and oriented. No focal neurological deficits. Extremities: Limited range of motion of her lower extremity due to pain Skin: No rashes, lesions or ulcers Psychiatry: Alert to name and place   Objective: Vitals:   10/01/22 2056 10/01/22 2311 10/02/22 0420 10/02/22 0752  BP:  (!) 173/81 (!) 156/74 (!) 178/88  Pulse:  (!) 104 (!) 102 88  Resp:  '16 17 16  '$ Temp: 98.4 F (36.9 C) 98.8 F (37.1 C) 98.5 F (36.9 C) 98 F (36.7 C)  TempSrc: Oral Oral Oral Oral  SpO2:  99% 95% 100%    Intake/Output Summary (Last 24 hours) at 10/02/2022 0813 Last data filed at 10/02/2022 0600 Gross per 24 hour  Intake 0 ml  Output --  Net 0 ml   There were no vitals filed for this visit.   Data Reviewed:   CBC: Recent Labs  Lab 10/01/22 1755 10/02/22 0242  WBC 23.2* 22.2*  NEUTROABS 19.8*  --   HGB 10.7* 10.6*  HCT 34.1* 32.9*  MCV 87.4 87.5  PLT 259 193   Basic Metabolic Panel: Recent Labs  Lab 10/01/22 1755 10/02/22 0242  NA 143 141  K 3.5 3.6  CL 114* 109  CO2 22 23  GLUCOSE 207* 159*  BUN 33* 25*  CREATININE 0.79 0.83  CALCIUM 8.7* 9.0   GFR: Estimated Creatinine Clearance: 44.7 mL/min (by C-G formula based  on SCr of 0.83 mg/dL). Liver Function Tests: No results for input(s): "AST", "ALT", "ALKPHOS", "BILITOT", "PROT", "ALBUMIN" in the last 168 hours. No results for input(s): "LIPASE", "AMYLASE" in the last 168 hours. No results for input(s): "AMMONIA" in the last 168 hours. Coagulation Profile: Recent Labs  Lab 10/01/22 1755  INR 1.0   Cardiac Enzymes: No results for input(s): "CKTOTAL", "CKMB", "CKMBINDEX", "TROPONINI" in the last 168  hours. BNP (last 3 results) No results for input(s): "PROBNP" in the last 8760 hours. HbA1C: No results for input(s): "HGBA1C" in the last 72 hours. CBG: Recent Labs  Lab 10/01/22 2309 10/02/22 0417 10/02/22 0749  GLUCAP 179* 154* 158*   Lipid Profile: No results for input(s): "CHOL", "HDL", "LDLCALC", "TRIG", "CHOLHDL", "LDLDIRECT" in the last 72 hours. Thyroid Function Tests: No results for input(s): "TSH", "T4TOTAL", "FREET4", "T3FREE", "THYROIDAB" in the last 72 hours. Anemia Panel: No results for input(s): "VITAMINB12", "FOLATE", "FERRITIN", "TIBC", "IRON", "RETICCTPCT" in the last 72 hours. Sepsis Labs: No results for input(s): "PROCALCITON", "LATICACIDVEN" in the last 168 hours.  No results found for this or any previous visit (from the past 240 hour(s)).       Radiology Studies: DG Ribs Unilateral W/Chest Left  Result Date: 10/01/2022 CLINICAL DATA:  Pt to ED via EMS from home c/o fall from dinning room chair. Pt was trying to sit down and missed the chair. Not on blood thinners . No LOC. Now c/o pain to right knee, LT hip/knee and ribs. EXAM: LEFT RIBS AND CHEST - 3+ VIEW COMPARISON:  Chest radiograph, 01/19/2022 FINDINGS: No convincing acute fracture. Old, healed fracture of the anterior left tenth rib. No bone lesion. Previous cardiac surgery and valve replacement. There is opacity at the left lung base consistent with loculated pleural fluid. Lungs otherwise clear. No pneumothorax. IMPRESSION: 1. No acute fracture. 2. No acute cardiopulmonary disease. Electronically Signed   By: Lajean Manes M.D.   On: 10/01/2022 17:38   DG Knee Complete 4 Views Right  Result Date: 10/01/2022 CLINICAL DATA:  Pt to ED via EMS from home c/o fall from dinning room chair. Pt was trying to sit down and missed the chair. Not on blood thinners . No LOC. Now c/o pain to right knee, LT hip/knee and ribs. EXAM: RIGHT KNEE - COMPLETE 4+ VIEW COMPARISON:  None Available. FINDINGS: No fracture. No  bone lesion. Skeletal structures are demineralized. Knee joint normally spaced and aligned.  No joint effusion. Posterior arterial vascular calcifications. IMPRESSION: No fracture or dislocation. Electronically Signed   By: Lajean Manes M.D.   On: 10/01/2022 17:37   DG Knee Complete 4 Views Left  Result Date: 10/01/2022 CLINICAL DATA:  Pt to ED via EMS from home c/o fall from dinning room chair. Pt was trying to sit down and missed the chair. Not on blood thinners . No LOC. Now c/o pain to right knee, LT hip/knee and ribs. EXAM: LEFT KNEE - COMPLETE 4+ VIEW COMPARISON:  None Available. FINDINGS: No fracture. No bone lesion. Skeletal structures are demineralized. Knee joint normally spaced and aligned. No significant arthropathic changes. No joint effusion. Posterior arterial vascular calcifications. IMPRESSION: No fracture or dislocation. Electronically Signed   By: Lajean Manes M.D.   On: 10/01/2022 17:36   DG Hip Unilat W or Wo Pelvis 2-3 Views Left  Result Date: 10/01/2022 CLINICAL DATA:  Pt to ED via EMS from home c/o fall from dinning room chair. Pt was trying to sit down and missed the chair. Not on blood  thinners . No LOC. Now c/o pain to right knee, LT hip/knee and ribs. EXAM: DG HIP (WITH OR WITHOUT PELVIS) 2-3V LEFT COMPARISON:  None Available. FINDINGS: Comminuted displaced intertrochanteric fracture of the proximal left femur. Displacement relatively mild, proximally 1.5 cm. Separate fracture components of the lesser and greater trochanters. Minor varus angulation. No other fractures. There is diffuse sclerosis along the left femoral shaft of unclear etiology and chronicity. No lytic lesions. Remaining skeletal structures are diffusely demineralized. Hip joints, SI joints and symphysis pubis are normally aligned. Extensive arterial vascular calcifications. IMPRESSION: 1. Comminuted, mildly displaced and mildly varus angulated intertrochanteric fracture of the proximal left femur. Electronically  Signed   By: Lajean Manes M.D.   On: 10/01/2022 17:35   CT Cervical Spine Wo Contrast  Result Date: 10/01/2022 CLINICAL DATA:  [place] from chair, dimension EXAM: CT CERVICAL SPINE WITHOUT CONTRAST TECHNIQUE: Multidetector CT imaging of the cervical spine was performed without intravenous contrast. Multiplanar CT image reconstructions were also generated. RADIATION DOSE REDUCTION: This exam was performed according to the departmental dose-optimization program which includes automated exposure control, adjustment of the mA and/or kV according to patient size and/or use of iterative reconstruction technique. COMPARISON:  01/19/2022 FINDINGS: Alignment: Alignment is grossly anatomic. Skull base and vertebrae: No acute fracture. No primary bone lesion or focal pathologic process. Soft tissues and spinal canal: No prevertebral fluid or swelling. No visible canal hematoma. Multinodular goiter again noted and unchanged. No specific imaging follow-up is required. Disc levels: There is diffuse multilevel spondylosis and facet hypertrophy unchanged since prior exam. Upper chest: Airway is patent.  Lung apices are clear. Other: Reconstructed images demonstrate no additional findings. IMPRESSION: 1. No acute cervical spine fracture. Electronically Signed   By: Randa Ngo M.D.   On: 10/01/2022 17:22   CT Head Wo Contrast  Result Date: 10/01/2022 CLINICAL DATA:  [place] from chair, dementia EXAM: CT HEAD WITHOUT CONTRAST TECHNIQUE: Contiguous axial images were obtained from the base of the skull through the vertex without intravenous contrast. RADIATION DOSE REDUCTION: This exam was performed according to the departmental dose-optimization program which includes automated exposure control, adjustment of the mA and/or kV according to patient size and/or use of iterative reconstruction technique. COMPARISON:  08/31/2022 FINDINGS: Brain: Stable chronic small-vessel ischemic changes throughout the periventricular white matter  and basal ganglia. Prior left parietal cortical infarct. No acute infarct or hemorrhage. Lateral ventricles are unremarkable. Stable sellar mass previously documented as macro adenoma. No acute extra-axial fluid collections. No mass effect. Vascular: Stable atherosclerosis.  No hyperdense vessel. Skull: Stable erosive changes of the left cavernous sinus and sphenoid sinus related to the sellar mass described above. No acute bony abnormality. Sinuses/Orbits: Stable opacification of the sphenoid sinus. Remaining paranasal sinuses are clear. Other: None. IMPRESSION: 1. No acute intracranial process. 2. Stable chronic ischemic changes as above. 3. Stable sellar mass with erosive changes into the left cavernous sinus and sphenoid sinus, compatible macro adenoma based on previous imaging. Electronically Signed   By: Randa Ngo M.D.   On: 10/01/2022 17:20        Scheduled Meds:  atorvastatin  40 mg Oral Daily   insulin aspart  0-9 Units Subcutaneous Q4H   lisinopril  20 mg Oral Daily   Continuous Infusions:   LOS: 1 day   Time spent= 35 mins    Azar South Arsenio Loader, MD [facility]  If 7PM-7AM, please contact night-coverage  10/02/2022, 8:13 AM

## 2022-10-02 NOTE — Op Note (Signed)
Date of Surgery: 10/02/2022  INDICATIONS: Ms. Matzke is a 88 y.o.-year-old female with left displaced intertrochanteric hip fracture.  After discussion with her daughter they have elected for operative repair.  The risk and benefits of the procedure were discussed in detail and documented in the pre-operative evaluation.   PREOPERATIVE DIAGNOSIS: 1.  Left displaced intertrochanteric hip fracture  POSTOPERATIVE DIAGNOSIS: Same.  PROCEDURE: 1.  Left hip cephalomedullary nail placement  SURGEON: Yevonne Pax MD  ASSISTANT: Orvis Brill, Scrub Technician  ANESTHESIA:  general  IV FLUIDS AND URINE: See anesthesia record.  ANTIBIOTICS: Ancef  ESTIMATED BLOOD LOSS: 15 mL.  IMPLANTS:  Implant Name Type Inv. Item Serial No. Manufacturer Lot No. LRB No. Used Action  NAIL IM AFFIXUS [id number] 125D LT - [id number] Nail NAIL IM AFFIXUS [id number] 125D LT  [employer](ORTH,TRAU,BIO,SG) [id number] Left 1 Implanted  SCREW LAG HIP NAIL 10.5X95 - [id number] Screw SCREW LAG HIP NAIL 10.5X95  [employer](ORTH,TRAU,BIO,SG) [id number] Left 1 Implanted  SCREW BONE CORTICAL 5.0X42 - [id number] Screw SCREW BONE CORTICAL 5.0X42  [employer](ORTH,TRAU,BIO,SG) [id number] Left 1 Implanted  SCREW BONE CORTICAL 5.0X40 - [id number] Screw SCREW BONE CORTICAL 5.0X40  [employer](ORTH,TRAU,BIO,SG) [id number] Left 1 Implanted    DRAINS: None  CULTURES: None  COMPLICATIONS: none  DESCRIPTION OF PROCEDURE:  The patient was identified in the preoperative holding area and the correct site was marked according universal protocol with nursing, and was subsequently taken back to the operating room.  Anesthesia was induced.  Antibiotics were given 1 hour prior to skin incision.  The patient was placed on the Hana table.  The traction post was placed.  Gross traction and internal rotation was able to provide the best reduction.  The patient was subsequently prepped and draped in the usual sterile fashion.   Final timeout  was performed.  A posterolateral approach to the greater trochanter was used.  This was done 3 cm proximal to the greater trochanter.  10 blade was used to incise through skin and IT band.  Blunt dissection was performed down to the level of the greater trochanter.  The pin was placed under direct fluoroscopic visualization at the center of the greater trochanter at the tip.  This was malleted into place down to the level of the lesser trochanter.  Opening reamer was then used.  A ball-tipped guidewire was then place to distal metaphyseal bone in the femur.  This was passed across the fracture site.  The ball wire was then measured and the appropriate size nail size long nail in 74m was taken.  Size 13 reamer was used over the guidewire.  Nail was introduced.  The cephalomedullary wire was placed.  Again measurement was taken after confirming center center on the AP lateral fluoroscopy.  The appropriate size screw was then placed into the neck component of the femur.  The screw was tightened and then backed off one quarter term to allow for compression.  Distal interlocks were then placed.  This was done during perfect circle technique.  15 blade was used to incise through skin and IT band.  Drill was then used bicortically.  Screw sizes were measured and then 2 screws were placed both in static fashion.  The jig was removed.  The wounds were thoroughly irrigated.  Final fluoroscopy was confirmed good reduction on AP and laterals.  Wounds were closed in layers of 0 Vicryl 2-0 Vicryl and staples.  An Aquacel dressing was placed. All counts were correct at the end of the  case. The patient was awoken and taken to the PACU without complication.      POSTOPERATIVE PLAN: She will be weightbearing as tolerated on the left lower extremity.  She will be seen by physical therapy while in the hospital.  This time Lovenox for blood clot prevention.  I will see her back in 2 weeks in the office for staple removal  Yevonne Pax, MD 11:17 AM

## 2022-10-02 NOTE — Discharge Instructions (Signed)
     Discharge Instructions    Attending Surgeon: Vanetta Mulders, MD Office Phone Number: [phone]   Diagnosis and Procedures:    Surgeries Performed: Left hip nailing  Discharge Plan:     Activity:  Weight bearing as tolerated on your left leg. You are advised to go home directly from the hospital or surgical center. Restrict your activities.  GENERAL INSTRUCTIONS: 1.  Please apply ice to your wound to help with swelling and inflammation. This will improve your comfort and your overall recovery following surgery.     2. Please call Dr. Eddie Dibbles office at [phone] with questions Monday-Friday during business hours. If no one answers, please leave a message and someone should get back to the patient within 24 hours. For emergencies please call 911 or proceed to the emergency room.   3. Patient to notify surgical team if experiences any of the following: Bowel/Bladder dysfunction, uncontrolled pain, nerve/muscle weakness, incision with increased drainage or redness, nausea/vomiting and Fever greater than 101.0 F.  Be alert for signs of infection including redness, streaking, odor, fever or chills. Be alert for excessive pain or bleeding and notify your surgeon immediately.  WOUND INSTRUCTIONS:   Leave your dressing, cast, or splint in place until your post operative visit.  Keep it clean and dry.  Always keep the incision clean and dry until the staples/sutures are removed. If there is no drainage from the incision you should keep it open to air. If there is drainage from the incision you must keep it covered at all times until the drainage stops  Do not soak in a bath tub, hot tub, pool, lake or other body of water until 21 days after your surgery and your incision is completely dry and healed.  If you have removable sutures (or staples) they must be removed 10-14 days (unless otherwise instructed) from the day of your surgery.     1)  Elevate the extremity as much as  possible.  2)  Keep the dressing clean and dry.  3)  Please call us if the dressing becomes wet or dirty.  4)  If you are experiencing worsening pain or worsening swelling, please call.

## 2022-10-02 NOTE — Progress Notes (Signed)
Patient taken for surgery. Daughter, Florentina Jenny, called and made aware. No orders for consent, OR staff aware. CHG given. Insulin given to cover CBG as ordered. Per patient, NPO since 10/01/22 at 1400.

## 2022-10-02 NOTE — Brief Op Note (Signed)
   Brief Op Note  Date of Surgery: 10/02/2022  Preoperative Diagnosis: Left hip fracture  Postoperative Diagnosis: same  Procedure: Procedure(s): INTRAMEDULLARY (IM) NAIL INTERTROCHANTERIC  Implants: Implant Name Type Inv. Item Serial No. Manufacturer Lot No. LRB No. Used Action  NAIL IM AFFIXUS 11X380 125D LT - [id number] Nail NAIL IM AFFIXUS 11X380 125D LT  [employer](ORTH,TRAU,BIO,SG) [id number] Left 1 Implanted  SCREW LAG HIP NAIL 10.5X95 - [id number] Screw SCREW LAG HIP NAIL 10.5X95  [employer](ORTH,TRAU,BIO,SG) [id number] Left 1 Implanted  SCREW BONE CORTICAL 5.0X42 - [id number] Screw SCREW BONE CORTICAL 5.0X42  [employer](ORTH,TRAU,BIO,SG) [id number] Left 1 Implanted  SCREW BONE CORTICAL 5.0X40 - [id number] Screw SCREW BONE CORTICAL 5.0X40  [employer](ORTH,TRAU,BIO,SG) [id number] Left 1 Implanted    Surgeons: Surgeon(s): Vanetta Mulders, MD  Anesthesia: General    Estimated Blood Loss: See anesthesia record  Complications: None  Condition to PACU: Stable  Yevonne Pax, MD 10/02/2022 11:17 AM

## 2022-10-02 NOTE — Anesthesia Preprocedure Evaluation (Addendum)
Anesthesia Evaluation  Patient identified by MRN, date of birth, ID band Patient awake  General Assessment Comment:Case discussed in detail with patient's daughter. Dr. Nyoka Cowden  Reviewed: Allergy & Precautions, NPO status , Patient's Chart, lab work & pertinent test results  Airway Mallampati: II  TM Distance: >3 FB     Dental   Pulmonary neg pulmonary ROS,    breath sounds clear to auscultation       Cardiovascular hypertension,  Rhythm:Regular Rate:Normal     Neuro/Psych PSYCHIATRIC DISORDERS    GI/Hepatic Neg liver ROS, Hx noted Dr. Nyoka Cowden   Endo/Other  diabetes  Renal/GU negative Renal ROS     Musculoskeletal   Abdominal   Peds  Hematology   Anesthesia Other Findings   Reproductive/Obstetrics                            Anesthesia Physical Anesthesia Plan  ASA: 3  Anesthesia Plan: General   Post-op Pain Management:    Induction:   PONV Risk Score and Plan: 3 and Ondansetron and Treatment may vary due to age or medical condition  Airway Management Planned: Oral ETT  Additional Equipment:   Intra-op Plan:   Post-operative Plan: Possible Post-op intubation/ventilation  Informed Consent: I have reviewed the patients History and Physical, chart, labs and discussed the procedure including the risks, benefits and alternatives for the proposed anesthesia with the patient or authorized representative who has indicated his/her understanding and acceptance.   Patient has DNR.  Suspend DNR.   Dental advisory given  Plan Discussed with: CRNA and Anesthesiologist  Anesthesia Plan Comments:        Anesthesia Quick Evaluation

## 2022-10-02 NOTE — Anesthesia Procedure Notes (Signed)
Procedure Name: Intubation Date/Time: 10/02/2022 10:09 AM  Performed by: Janace Litten, CRNAPre-anesthesia Checklist: Patient identified, Emergency Drugs available, Suction available and Patient being monitored Patient Re-evaluated:Patient Re-evaluated prior to induction Oxygen Delivery Method: Circle System Utilized Preoxygenation: Pre-oxygenation with 100% oxygen Induction Type: IV induction Ventilation: Mask ventilation without difficulty Laryngoscope Size: Mac and 3 Grade View: Grade I Tube type: Oral Tube size: 7.0 mm Number of attempts: 1 Airway Equipment and Method: Stylet and Oral airway Placement Confirmation: ETT inserted through vocal cords under direct vision, positive ETCO2 and breath sounds checked- equal and bilateral Secured at: 21 cm Tube secured with: Tape Dental Injury: Teeth and Oropharynx as per pre-operative assessment

## 2022-10-02 NOTE — Transfer of Care (Signed)
Immediate Anesthesia Transfer of Care Note  Patient: Christine Newton  Procedure(s) Performed: INTRAMEDULLARY (IM) NAIL INTERTROCHANTERIC (Left: Hip)  Patient Location: PACU  Anesthesia Type:General  Level of Consciousness: drowsy, patient cooperative and Patient remains intubated per anesthesia plan  Airway & Oxygen Therapy: Patient Spontanous Breathing  Post-op Assessment: Report given to RN and Post -op Vital signs reviewed and stable  Post vital signs: Reviewed and stable  Last Vitals:  Vitals Value Taken Time  BP 142/74 10/02/22 1122  Temp    Pulse 103 10/02/22 1126  Resp 21 10/02/22 1126  SpO2 89 % 10/02/22 1126  Vitals shown include unvalidated device data.  Last Pain:  Vitals:   10/02/22 0752  TempSrc: Oral  PainSc:          Complications: No notable events documented.

## 2022-10-02 NOTE — Consult Note (Signed)
ORTHOPAEDIC CONSULTATION  REQUESTING PHYSICIAN: Damita Lack, MD  Chief Complaint: Left hip pain  HPI: Christine Newton is a 88 y.o. female who presents with presents after a fall while at her house.  Of note she does have a history of dementia and much of the history was collected from her daughter.  She is a home ambulator with assistive devices.  She does have a caregiver during the day and her daughter Christine Newton takes care of her at night.  She had a fall and was unable to bear weight on the left side.  As result she presented to the emergency room for definitive management.  She is on lisinopril at home.  She is not on any anticoagulation.  Past Medical History:  Diagnosis Date   Diabetes ([place])    Hypertension    Nonrheumatic aortic (valve) stenosis 09/21/2020   Past Surgical History:  Procedure Laterality Date   CARDIAC VALVE REPLACEMENT     CHOLECYSTECTOMY     Social History   Socioeconomic History   Marital status: Widowed    Spouse name: Not on file   Number of children: 2   Years of education: 12 + 4   Highest education level: Not on file  Occupational History   Occupation: RETIRED SOCIAL WORKER  Tobacco Use   Smoking status: Never   Smokeless tobacco: Never  Vaping Use   Vaping Use: Never used  Substance and Sexual Activity   Alcohol use: Never   Drug use: Never   Sexual activity: Not Currently  Other Topics Concern   Not on file  Social History Narrative   Not on file   Social Determinants of Health   Financial Resource Strain: Not on file  Food Insecurity: Not on file  Transportation Needs: Not on file  Physical Activity: Not on file  Stress: Not on file  Social Connections: Not on file   History reviewed. No pertinent family history. - negative except otherwise stated in the family history section Allergies  Allergen Reactions   Cephalosporins Nausea Only   Prior to Admission medications   Medication Sig Start Date End Date Taking?  Authorizing Provider  aspirin EC 81 MG tablet Take 1 tablet (81 mg total) by mouth daily. Swallow whole. 01/21/21   Frann Rider, NP  atorvastatin (LIPITOR) 40 MG tablet Take 1 tablet (40 mg total) by mouth daily. 03/03/22   Lillard Anes, MD  lisinopril (ZESTRIL) 20 MG tablet Take 1 tablet (20 mg total) by mouth daily. 07/21/22   Lillard Anes, MD  metFORMIN (GLUCOPHAGE) 500 MG tablet Take 1 tablet (500 mg total) by mouth daily with breakfast. Take 2 every morning and 1 every evening Patient taking differently: Take 500 mg by mouth daily with breakfast. 07/11/22   Rochel Brome, MD   DG Ribs Unilateral W/Chest Left  Result Date: 10/01/2022 CLINICAL DATA:  Pt to ED via EMS from home c/o fall from dinning room chair. Pt was trying to sit down and missed the chair. Not on blood thinners . No LOC. Now c/o pain to right knee, LT hip/knee and ribs. EXAM: LEFT RIBS AND CHEST - 3+ VIEW COMPARISON:  Chest radiograph, 01/19/2022 FINDINGS: No convincing acute fracture. Old, healed fracture of the anterior left tenth rib. No bone lesion. Previous cardiac surgery and valve replacement. There is opacity at the left lung base consistent with loculated pleural fluid. Lungs otherwise clear. No pneumothorax. IMPRESSION: 1. No acute fracture. 2. No acute cardiopulmonary disease. Electronically Signed  By: Lajean Manes M.D.   On: 10/01/2022 17:38   DG Knee Complete 4 Views Right  Result Date: 10/01/2022 CLINICAL DATA:  Pt to ED via EMS from home c/o fall from dinning room chair. Pt was trying to sit down and missed the chair. Not on blood thinners . No LOC. Now c/o pain to right knee, LT hip/knee and ribs. EXAM: RIGHT KNEE - COMPLETE 4+ VIEW COMPARISON:  None Available. FINDINGS: No fracture. No bone lesion. Skeletal structures are demineralized. Knee joint normally spaced and aligned.  No joint effusion. Posterior arterial vascular calcifications. IMPRESSION: No fracture or dislocation. Electronically  Signed   By: Lajean Manes M.D.   On: 10/01/2022 17:37   DG Knee Complete 4 Views Left  Result Date: 10/01/2022 CLINICAL DATA:  Pt to ED via EMS from home c/o fall from dinning room chair. Pt was trying to sit down and missed the chair. Not on blood thinners . No LOC. Now c/o pain to right knee, LT hip/knee and ribs. EXAM: LEFT KNEE - COMPLETE 4+ VIEW COMPARISON:  None Available. FINDINGS: No fracture. No bone lesion. Skeletal structures are demineralized. Knee joint normally spaced and aligned. No significant arthropathic changes. No joint effusion. Posterior arterial vascular calcifications. IMPRESSION: No fracture or dislocation. Electronically Signed   By: Lajean Manes M.D.   On: 10/01/2022 17:36   DG Hip Unilat W or Wo Pelvis 2-3 Views Left  Result Date: 10/01/2022 CLINICAL DATA:  Pt to ED via EMS from home c/o fall from dinning room chair. Pt was trying to sit down and missed the chair. Not on blood thinners . No LOC. Now c/o pain to right knee, LT hip/knee and ribs. EXAM: DG HIP (WITH OR WITHOUT PELVIS) 2-3V LEFT COMPARISON:  None Available. FINDINGS: Comminuted displaced intertrochanteric fracture of the proximal left femur. Displacement relatively mild, proximally 1.5 cm. Separate fracture components of the lesser and greater trochanters. Minor varus angulation. No other fractures. There is diffuse sclerosis along the left femoral shaft of unclear etiology and chronicity. No lytic lesions. Remaining skeletal structures are diffusely demineralized. Hip joints, SI joints and symphysis pubis are normally aligned. Extensive arterial vascular calcifications. IMPRESSION: 1. Comminuted, mildly displaced and mildly varus angulated intertrochanteric fracture of the proximal left femur. Electronically Signed   By: Lajean Manes M.D.   On: 10/01/2022 17:35   CT Cervical Spine Wo Contrast  Result Date: 10/01/2022 CLINICAL DATA:  [place] from chair, dimension EXAM: CT CERVICAL SPINE WITHOUT CONTRAST TECHNIQUE:  Multidetector CT imaging of the cervical spine was performed without intravenous contrast. Multiplanar CT image reconstructions were also generated. RADIATION DOSE REDUCTION: This exam was performed according to the departmental dose-optimization program which includes automated exposure control, adjustment of the mA and/or kV according to patient size and/or use of iterative reconstruction technique. COMPARISON:  01/19/2022 FINDINGS: Alignment: Alignment is grossly anatomic. Skull base and vertebrae: No acute fracture. No primary bone lesion or focal pathologic process. Soft tissues and spinal canal: No prevertebral fluid or swelling. No visible canal hematoma. Multinodular goiter again noted and unchanged. No specific imaging follow-up is required. Disc levels: There is diffuse multilevel spondylosis and facet hypertrophy unchanged since prior exam. Upper chest: Airway is patent.  Lung apices are clear. Other: Reconstructed images demonstrate no additional findings. IMPRESSION: 1. No acute cervical spine fracture. Electronically Signed   By: Randa Ngo M.D.   On: 10/01/2022 17:22   CT Head Wo Contrast  Result Date: 10/01/2022 CLINICAL DATA:  [place] from chair, dementia  EXAM: CT HEAD WITHOUT CONTRAST TECHNIQUE: Contiguous axial images were obtained from the base of the skull through the vertex without intravenous contrast. RADIATION DOSE REDUCTION: This exam was performed according to the departmental dose-optimization program which includes automated exposure control, adjustment of the mA and/or kV according to patient size and/or use of iterative reconstruction technique. COMPARISON:  08/31/2022 FINDINGS: Brain: Stable chronic small-vessel ischemic changes throughout the periventricular white matter and basal ganglia. Prior left parietal cortical infarct. No acute infarct or hemorrhage. Lateral ventricles are unremarkable. Stable sellar mass previously documented as macro adenoma. No acute extra-axial fluid  collections. No mass effect. Vascular: Stable atherosclerosis.  No hyperdense vessel. Skull: Stable erosive changes of the left cavernous sinus and sphenoid sinus related to the sellar mass described above. No acute bony abnormality. Sinuses/Orbits: Stable opacification of the sphenoid sinus. Remaining paranasal sinuses are clear. Other: None. IMPRESSION: 1. No acute intracranial process. 2. Stable chronic ischemic changes as above. 3. Stable sellar mass with erosive changes into the left cavernous sinus and sphenoid sinus, compatible macro adenoma based on previous imaging. Electronically Signed   By: Randa Ngo M.D.   On: 10/01/2022 17:20     Positive ROS: All other systems have been reviewed and were otherwise negative with the exception of those mentioned in the HPI and as above.  Physical Exam: General: No acute distress Cardiovascular: No pedal edema Respiratory: No cyanosis, no use of accessory musculature GI: No organomegaly, abdomen is soft and non-tender Skin: No lesions in the area of chief complaint Neurologic: Sensation intact distally Psychiatric: Patient is at baseline mood and affect Lymphatic: No axillary or cervical lymphadenopathy  MUSCULOSKELETAL:  Left hip shortened externally rotated.  Toes are warm and well perfused.  Spontaneously plantarflex the left foot.  Independent Imaging Review: X-ray AP pelvis 2 views left hip Displaced intertrochanteric hip fracture with shortening at the fracture site  Assessment: 88 year old female with a displaced intertrochanteric hip fracture after a fall at home.  At this time she was previously mobile within her home.  I have talked over treatment options with her daughter.  This will include a period of mobilization with nonweightbearing on the left hip versus cephalomedullary nailing so as to allow for earlier mobilization.  This would also allow for hygiene.  After discussion of the risks and benefits including undergoing  anesthesia her daughter has elected for left hip cephalomedullary nailing.  Plan: Plan for left hip cephalomedullary nailing  Thank you for the consult and the opportunity to see Ms. Lanier Prude, MD [facility] 8:44 AM

## 2022-10-02 NOTE — Anesthesia Postprocedure Evaluation (Signed)
Anesthesia Post Note  Patient: Christine Newton  Procedure(s) Performed: INTRAMEDULLARY (IM) NAIL INTERTROCHANTERIC (Left: Hip)     Patient location during evaluation: PACU Anesthesia Type: General Level of consciousness: awake Pain management: pain level controlled Vital Signs Assessment: post-procedure vital signs reviewed and stable Respiratory status: spontaneous breathing Cardiovascular status: stable Postop Assessment: no apparent nausea or vomiting Anesthetic complications: no   No notable events documented.  Last Vitals:  Vitals:   10/02/22 1207 10/02/22 1210  BP: (!) 91/59   Pulse: 89   Resp: 16   Temp:    SpO2: 95% 92%    Last Pain:  Vitals:   10/02/22 1155  TempSrc:   PainSc: 0-No pain                 Kordel Leavy

## 2022-10-02 NOTE — H&P (View-Only) (Signed)
ORTHOPAEDIC CONSULTATION  REQUESTING PHYSICIAN: Damita Lack, MD  Chief Complaint: Left hip pain  HPI: Christine Newton is a 88 y.o. female who presents with presents after a fall while at her house.  Of note she does have a history of dementia and much of the history was collected from her daughter.  She is a home ambulator with assistive devices.  She does have a caregiver during the day and her daughter Christine Newton takes care of her at night.  She had a fall and was unable to bear weight on the left side.  As result she presented to the emergency room for definitive management.  She is on lisinopril at home.  She is not on any anticoagulation.  Past Medical History:  Diagnosis Date   Diabetes ([place])    Hypertension    Nonrheumatic aortic (valve) stenosis 09/21/2020   Past Surgical History:  Procedure Laterality Date   CARDIAC VALVE REPLACEMENT     CHOLECYSTECTOMY     Social History   Socioeconomic History   Marital status: Widowed    Spouse name: Not on file   Number of children: 2   Years of education: 12 + 4   Highest education level: Not on file  Occupational History   Occupation: RETIRED SOCIAL WORKER  Tobacco Use   Smoking status: Never   Smokeless tobacco: Never  Vaping Use   Vaping Use: Never used  Substance and Sexual Activity   Alcohol use: Never   Drug use: Never   Sexual activity: Not Currently  Other Topics Concern   Not on file  Social History Narrative   Not on file   Social Determinants of Health   Financial Resource Strain: Not on file  Food Insecurity: Not on file  Transportation Needs: Not on file  Physical Activity: Not on file  Stress: Not on file  Social Connections: Not on file   History reviewed. No pertinent family history. - negative except otherwise stated in the family history section Allergies  Allergen Reactions   Cephalosporins Nausea Only   Prior to Admission medications   Medication Sig Start Date End Date Taking?  Authorizing Provider  aspirin EC 81 MG tablet Take 1 tablet (81 mg total) by mouth daily. Swallow whole. 01/21/21   Frann Rider, NP  atorvastatin (LIPITOR) 40 MG tablet Take 1 tablet (40 mg total) by mouth daily. 03/03/22   Lillard Anes, MD  lisinopril (ZESTRIL) 20 MG tablet Take 1 tablet (20 mg total) by mouth daily. 07/21/22   Lillard Anes, MD  metFORMIN (GLUCOPHAGE) 500 MG tablet Take 1 tablet (500 mg total) by mouth daily with breakfast. Take 2 every morning and 1 every evening Patient taking differently: Take 500 mg by mouth daily with breakfast. 07/11/22   Rochel Brome, MD   DG Ribs Unilateral W/Chest Left  Result Date: 10/01/2022 CLINICAL DATA:  Pt to ED via EMS from home c/o fall from dinning room chair. Pt was trying to sit down and missed the chair. Not on blood thinners . No LOC. Now c/o pain to right knee, LT hip/knee and ribs. EXAM: LEFT RIBS AND CHEST - 3+ VIEW COMPARISON:  Chest radiograph, 01/19/2022 FINDINGS: No convincing acute fracture. Old, healed fracture of the anterior left tenth rib. No bone lesion. Previous cardiac surgery and valve replacement. There is opacity at the left lung base consistent with loculated pleural fluid. Lungs otherwise clear. No pneumothorax. IMPRESSION: 1. No acute fracture. 2. No acute cardiopulmonary disease. Electronically Signed  By: Lajean Manes M.D.   On: 10/01/2022 17:38   DG Knee Complete 4 Views Right  Result Date: 10/01/2022 CLINICAL DATA:  Pt to ED via EMS from home c/o fall from dinning room chair. Pt was trying to sit down and missed the chair. Not on blood thinners . No LOC. Now c/o pain to right knee, LT hip/knee and ribs. EXAM: RIGHT KNEE - COMPLETE 4+ VIEW COMPARISON:  None Available. FINDINGS: No fracture. No bone lesion. Skeletal structures are demineralized. Knee joint normally spaced and aligned.  No joint effusion. Posterior arterial vascular calcifications. IMPRESSION: No fracture or dislocation. Electronically  Signed   By: Lajean Manes M.D.   On: 10/01/2022 17:37   DG Knee Complete 4 Views Left  Result Date: 10/01/2022 CLINICAL DATA:  Pt to ED via EMS from home c/o fall from dinning room chair. Pt was trying to sit down and missed the chair. Not on blood thinners . No LOC. Now c/o pain to right knee, LT hip/knee and ribs. EXAM: LEFT KNEE - COMPLETE 4+ VIEW COMPARISON:  None Available. FINDINGS: No fracture. No bone lesion. Skeletal structures are demineralized. Knee joint normally spaced and aligned. No significant arthropathic changes. No joint effusion. Posterior arterial vascular calcifications. IMPRESSION: No fracture or dislocation. Electronically Signed   By: Lajean Manes M.D.   On: 10/01/2022 17:36   DG Hip Unilat W or Wo Pelvis 2-3 Views Left  Result Date: 10/01/2022 CLINICAL DATA:  Pt to ED via EMS from home c/o fall from dinning room chair. Pt was trying to sit down and missed the chair. Not on blood thinners . No LOC. Now c/o pain to right knee, LT hip/knee and ribs. EXAM: DG HIP (WITH OR WITHOUT PELVIS) 2-3V LEFT COMPARISON:  None Available. FINDINGS: Comminuted displaced intertrochanteric fracture of the proximal left femur. Displacement relatively mild, proximally 1.5 cm. Separate fracture components of the lesser and greater trochanters. Minor varus angulation. No other fractures. There is diffuse sclerosis along the left femoral shaft of unclear etiology and chronicity. No lytic lesions. Remaining skeletal structures are diffusely demineralized. Hip joints, SI joints and symphysis pubis are normally aligned. Extensive arterial vascular calcifications. IMPRESSION: 1. Comminuted, mildly displaced and mildly varus angulated intertrochanteric fracture of the proximal left femur. Electronically Signed   By: Lajean Manes M.D.   On: 10/01/2022 17:35   CT Cervical Spine Wo Contrast  Result Date: 10/01/2022 CLINICAL DATA:  [place] from chair, dimension EXAM: CT CERVICAL SPINE WITHOUT CONTRAST TECHNIQUE:  Multidetector CT imaging of the cervical spine was performed without intravenous contrast. Multiplanar CT image reconstructions were also generated. RADIATION DOSE REDUCTION: This exam was performed according to the departmental dose-optimization program which includes automated exposure control, adjustment of the mA and/or kV according to patient size and/or use of iterative reconstruction technique. COMPARISON:  01/19/2022 FINDINGS: Alignment: Alignment is grossly anatomic. Skull base and vertebrae: No acute fracture. No primary bone lesion or focal pathologic process. Soft tissues and spinal canal: No prevertebral fluid or swelling. No visible canal hematoma. Multinodular goiter again noted and unchanged. No specific imaging follow-up is required. Disc levels: There is diffuse multilevel spondylosis and facet hypertrophy unchanged since prior exam. Upper chest: Airway is patent.  Lung apices are clear. Other: Reconstructed images demonstrate no additional findings. IMPRESSION: 1. No acute cervical spine fracture. Electronically Signed   By: Randa Ngo M.D.   On: 10/01/2022 17:22   CT Head Wo Contrast  Result Date: 10/01/2022 CLINICAL DATA:  [place] from chair, dementia  EXAM: CT HEAD WITHOUT CONTRAST TECHNIQUE: Contiguous axial images were obtained from the base of the skull through the vertex without intravenous contrast. RADIATION DOSE REDUCTION: This exam was performed according to the departmental dose-optimization program which includes automated exposure control, adjustment of the mA and/or kV according to patient size and/or use of iterative reconstruction technique. COMPARISON:  08/31/2022 FINDINGS: Brain: Stable chronic small-vessel ischemic changes throughout the periventricular white matter and basal ganglia. Prior left parietal cortical infarct. No acute infarct or hemorrhage. Lateral ventricles are unremarkable. Stable sellar mass previously documented as macro adenoma. No acute extra-axial fluid  collections. No mass effect. Vascular: Stable atherosclerosis.  No hyperdense vessel. Skull: Stable erosive changes of the left cavernous sinus and sphenoid sinus related to the sellar mass described above. No acute bony abnormality. Sinuses/Orbits: Stable opacification of the sphenoid sinus. Remaining paranasal sinuses are clear. Other: None. IMPRESSION: 1. No acute intracranial process. 2. Stable chronic ischemic changes as above. 3. Stable sellar mass with erosive changes into the left cavernous sinus and sphenoid sinus, compatible macro adenoma based on previous imaging. Electronically Signed   By: Randa Ngo M.D.   On: 10/01/2022 17:20     Positive ROS: All other systems have been reviewed and were otherwise negative with the exception of those mentioned in the HPI and as above.  Physical Exam: General: No acute distress Cardiovascular: No pedal edema Respiratory: No cyanosis, no use of accessory musculature GI: No organomegaly, abdomen is soft and non-tender Skin: No lesions in the area of chief complaint Neurologic: Sensation intact distally Psychiatric: Patient is at baseline mood and affect Lymphatic: No axillary or cervical lymphadenopathy  MUSCULOSKELETAL:  Left hip shortened externally rotated.  Toes are warm and well perfused.  Spontaneously plantarflex the left foot.  Independent Imaging Review: X-ray AP pelvis 2 views left hip Displaced intertrochanteric hip fracture with shortening at the fracture site  Assessment: 88 year old female with a displaced intertrochanteric hip fracture after a fall at home.  At this time she was previously mobile within her home.  I have talked over treatment options with her daughter.  This will include a period of mobilization with nonweightbearing on the left hip versus cephalomedullary nailing so as to allow for earlier mobilization.  This would also allow for hygiene.  After discussion of the risks and benefits including undergoing  anesthesia her daughter has elected for left hip cephalomedullary nailing.  Plan: Plan for left hip cephalomedullary nailing  Thank you for the consult and the opportunity to see Ms. Lanier Prude, MD [facility] 8:44 AM

## 2022-10-02 NOTE — Interval H&P Note (Signed)
History and Physical Interval Note:  10/02/2022 8:48 AM  Christine Newton  has presented today for surgery, with the diagnosis of Left hip fx.  The various methods of treatment have been discussed with the patient and family. After consideration of risks, benefits and other options for treatment, the patient has consented to  Procedure(s): INTRAMEDULLARY (IM) NAIL INTERTROCHANTERIC (Left) as a surgical intervention.  The patient's history has been reviewed, patient examined, no change in status, stable for surgery.  I have reviewed the patient's chart and labs.  Questions were answered to the patient's satisfaction.     Vanetta Mulders

## 2022-10-03 ENCOUNTER — Telehealth: Payer: Self-pay

## 2022-10-03 DIAGNOSIS — Z9181 History of falling: Secondary | ICD-10-CM | POA: Diagnosis not present

## 2022-10-03 DIAGNOSIS — E119 Type 2 diabetes mellitus without complications: Secondary | ICD-10-CM | POA: Diagnosis not present

## 2022-10-03 DIAGNOSIS — S72142A Displaced intertrochanteric fracture of left femur, initial encounter for closed fracture: Secondary | ICD-10-CM | POA: Diagnosis not present

## 2022-10-03 DIAGNOSIS — I119 Hypertensive heart disease without heart failure: Secondary | ICD-10-CM | POA: Diagnosis not present

## 2022-10-03 DIAGNOSIS — S72002D Fracture of unspecified part of neck of left femur, subsequent encounter for closed fracture with routine healing: Secondary | ICD-10-CM

## 2022-10-03 DIAGNOSIS — Z7984 Long term (current) use of oral hypoglycemic drugs: Secondary | ICD-10-CM | POA: Diagnosis not present

## 2022-10-03 DIAGNOSIS — I081 Rheumatic disorders of both mitral and tricuspid valves: Secondary | ICD-10-CM | POA: Diagnosis not present

## 2022-10-03 DIAGNOSIS — F0390 Unspecified dementia without behavioral disturbance: Secondary | ICD-10-CM | POA: Diagnosis not present

## 2022-10-03 DIAGNOSIS — I447 Left bundle-branch block, unspecified: Secondary | ICD-10-CM | POA: Diagnosis not present

## 2022-10-03 DIAGNOSIS — Z8673 Personal history of transient ischemic attack (TIA), and cerebral infarction without residual deficits: Secondary | ICD-10-CM

## 2022-10-03 DIAGNOSIS — E042 Nontoxic multinodular goiter: Secondary | ICD-10-CM | POA: Diagnosis not present

## 2022-10-03 DIAGNOSIS — Z7982 Long term (current) use of aspirin: Secondary | ICD-10-CM | POA: Diagnosis not present

## 2022-10-03 DIAGNOSIS — Z953 Presence of xenogenic heart valve: Secondary | ICD-10-CM

## 2022-10-03 LAB — GLUCOSE, CAPILLARY
Glucose-Capillary: 122 mg/dL — ABNORMAL HIGH (ref 70–99)
Glucose-Capillary: 128 mg/dL — ABNORMAL HIGH (ref 70–99)
Glucose-Capillary: 139 mg/dL — ABNORMAL HIGH (ref 70–99)
Glucose-Capillary: 139 mg/dL — ABNORMAL HIGH (ref 70–99)
Glucose-Capillary: 174 mg/dL — ABNORMAL HIGH (ref 70–99)
Glucose-Capillary: 187 mg/dL — ABNORMAL HIGH (ref 70–99)
Glucose-Capillary: 188 mg/dL — ABNORMAL HIGH (ref 70–99)

## 2022-10-03 LAB — CBC
HCT: 24.9 % — ABNORMAL LOW (ref 36.0–46.0)
Hemoglobin: 7.7 g/dL — ABNORMAL LOW (ref 12.0–15.0)
MCH: 27.7 pg (ref 26.0–34.0)
MCHC: 30.9 g/dL (ref 30.0–36.0)
MCV: 89.6 fL (ref 80.0–100.0)
Platelets: 185 10*3/uL (ref 150–400)
RBC: 2.78 MIL/uL — ABNORMAL LOW (ref 3.87–5.11)
RDW: 15.5 % (ref 11.5–15.5)
WBC: 19.1 10*3/uL — ABNORMAL HIGH (ref 4.0–10.5)
nRBC: 0.2 % (ref 0.0–0.2)

## 2022-10-03 LAB — URINALYSIS, ROUTINE W REFLEX MICROSCOPIC
Bilirubin Urine: NEGATIVE
Glucose, UA: NEGATIVE mg/dL
Hgb urine dipstick: NEGATIVE
Ketones, ur: NEGATIVE mg/dL
Leukocytes,Ua: NEGATIVE
Nitrite: NEGATIVE
Protein, ur: 30 mg/dL — AB
Specific Gravity, Urine: 1.025 (ref 1.005–1.030)
pH: 5 (ref 5.0–8.0)

## 2022-10-03 LAB — BASIC METABOLIC PANEL
Anion gap: 8 (ref 5–15)
BUN: 33 mg/dL — ABNORMAL HIGH (ref 8–23)
CO2: 22 mmol/L (ref 22–32)
Calcium: 8.2 mg/dL — ABNORMAL LOW (ref 8.9–10.3)
Chloride: 111 mmol/L (ref 98–111)
Creatinine, Ser: 1.32 mg/dL — ABNORMAL HIGH (ref 0.44–1.00)
GFR, Estimated: 39 mL/min — ABNORMAL LOW (ref >60)
Glucose, Bld: 160 mg/dL — ABNORMAL HIGH (ref 70–99)
Potassium: 4.5 mmol/L (ref 3.5–5.1)
Sodium: 141 mmol/L (ref 135–145)

## 2022-10-03 LAB — MAGNESIUM: Magnesium: 1.9 mg/dL (ref 1.7–2.4)

## 2022-10-03 LAB — ABO/RH: ABO/RH(D): O POS

## 2022-10-03 MED ORDER — ENOXAPARIN SODIUM 30 MG/0.3ML IJ SOSY
30.0000 mg | PREFILLED_SYRINGE | Freq: Every day | INTRAMUSCULAR | Status: DC
  Administered 2022-10-03 – 2022-10-05 (×3): 30 mg via SUBCUTANEOUS
  Filled 2022-10-03 (×3): qty 0.3

## 2022-10-03 MED ORDER — AMLODIPINE BESYLATE 5 MG PO TABS
5.0000 mg | ORAL_TABLET | Freq: Every day | ORAL | Status: DC
  Administered 2022-10-03 – 2022-10-07 (×5): 5 mg via ORAL
  Filled 2022-10-03 (×5): qty 1

## 2022-10-03 MED ORDER — SODIUM CHLORIDE 0.9 % IV SOLN: INTRAVENOUS | Status: DC

## 2022-10-03 MED ORDER — SODIUM CHLORIDE 0.9 % IV BOLUS
500.0000 mL | Freq: Once | INTRAVENOUS | Status: AC
  Administered 2022-10-03: 500 mL via INTRAVENOUS

## 2022-10-03 NOTE — Telephone Encounter (Signed)
   Cardiac Monitor Alert  Date of alert:  10/03/2022   Patient Name: Christine Newton  DOB: [birth year]  MRN: [id number]   [place] Cardiologist: Sinclair Grooms, MD  [place] EP:  None    Monitor Information: Cardiac Event Monitor [[employer]]  Reason:  Syncope and  Ordering provider:  Cecilie Kicks NP, ordered when patient was in hospital   Alert Ventricular Tachycardia (>60 sec) bpm 159 This is the 1st alert for this rhythm. This happened on 10/01/22 at 10:30 pm CT.  Next Cardiology Appointment   Date:  10/05/22  Provider:  Ermalinda Barrios PA  The patient could NOT be reached by telephone today.  Patient is in hospital after a fall on Sunday and broke her hip in the process. Patient had surgery yesterday for the broken hip. Talked to patient's daughter (DPR). She stated that Friday night they were up watching TV around that time and she moved patient from the couch to the bathroom to the bed. Patient was asymptomatic. Arrhythmia, symptoms and history reviewed with Dr. Ali Lowe, DOD.   Plan:  Dr. Ali Lowe wants to make sure the cardiologist, Dr. Tamala Julian is aware and following.  Other: Called patient's daughter back about monitor. Patient is on telemetry per her nurse, Mardene Celeste at the hospital. Informed nurse of patient's VT that she had on Friday prior to admission and wanted to make sure patient's hospitalist and surgeon are aware.  [name], RN  10/03/2022 9:41 AM

## 2022-10-03 NOTE — Evaluation (Signed)
Occupational Therapy Evaluation Patient Details Name: Christine Newton MRN: [id number] DOB: 1933/09/10 Today's Date: 10/03/2022   History of Present Illness Pt is a 88 y.o. F who presents 10/01/2022 after a fall with displaced left intertrochanteric hip fracture now s/p cephalomedullary nail placement. Significant PMH: dementia, TIA, DM2, s/p AVR.   Clinical Impression   PTA pt lives at home with her daughter and has 24/7 assistance available. At baseline she mobilizes @ RW level with close supervision and requires minimal cognitive and physical assistance with ADL tasks. Currently requires Max A +2 for mobility and ADL tasks. Will benefit from rehab at SNF with goal to return home with supportive family. Acute OT to follow.       Recommendations for follow up therapy are one component of a multi-disciplinary discharge planning process, led by the attending physician.  Recommendations may be updated based on patient status, additional functional criteria and insurance authorization.   Follow Up Recommendations  Skilled nursing-short term rehab (<3 hours/day)    Assistance Recommended at Discharge Frequent or constant Supervision/Assistance  Patient can return home with the following Two people to help with walking and/or transfers;Two people to help with bathing/dressing/bathroom;Assistance with cooking/housework;Direct supervision/assist for medications management;Direct supervision/assist for financial management;Assist for transportation;Help with stairs or ramp for entrance    Functional Status Assessment  Patient has had a recent decline in their functional status and demonstrates the ability to make significant improvements in function in a reasonable and predictable amount of time.  Equipment Recommendations  None recommended by OT    Recommendations for Other Services       Precautions / Restrictions Precautions Precautions: Fall Restrictions Weight Bearing Restrictions:  Yes LLE Weight Bearing: Weight bearing as tolerated      Mobility Bed Mobility               General bed mobility comments: OOB in chair    Transfers                   General transfer comment: attempted to stand. Pt pushing toward R and posterioroly; able to initiate forward/anterior weight shift with funcitonal tasks, i.e. rubbing lotion on lower leg      Balance Overall balance assessment: Needs assistance Sitting-balance support: Feet supported Sitting balance-[name]: Poor Sitting balance - Comments: right lateral lean, guarding away from left side. assist to reach midline                                   ADL either performed or assessed with clinical judgement   ADL Overall ADL's : Needs assistance/impaired Eating/Feeding: Set up   Grooming: Supervision/safety;Set up   Upper Body Bathing: Moderate assistance   Lower Body Bathing: Maximal assistance   Upper Body Dressing : Moderate assistance   Lower Body Dressing: Total assistance       Toileting- Clothing Manipulation and Hygiene: Total assistance       Functional mobility during ADLs: +2 for safety/equipment;Maximal assistance;Rolling walker (2 wheels);Cueing for sequencing;Cueing for safety       Vision Baseline Vision/History: 1 Wears glasses       Perception     Praxis      Pertinent Vitals/Pain Pain Assessment Pain Assessment: Faces Faces Pain Scale: Hurts little more Pain Location: LLE Pain Descriptors / Indicators: Grimacing, Operative site guarding Pain Intervention(s): Limited activity within patient's tolerance, Premedicated before session     Hand Dominance Left  Extremity/Trunk Assessment Upper Extremity Assessment Upper Extremity Assessment: Generalized weakness   Lower Extremity Assessment Lower Extremity Assessment: Defer to PT evaluation LLE Deficits / Details: Hip/knee grossly 1/5, ankle dorsiflexion WFL   Cervical / Trunk  Assessment Cervical / Trunk Assessment: Other exceptions Cervical / Trunk Exceptions: R bias; posterior push in standing   Communication Communication Communication: No difficulties   Cognition Arousal/Alertness: Awake/alert Behavior During Therapy: Flat affect Overall Cognitive Status: History of cognitive impairments - at baseline                                 General Comments: Decreased initiation, responds to questions when asked, although not always accurate, follows 1 step commands ~75% of the time     General Comments  VSS on 2L; increased RR noted with mobiity    Exercises     Shoulder Instructions      Home Living Family/patient expects to be discharged to:: Private residence Living Arrangements: Children Available Help at Discharge: Family;Available 24 hours/day (Nurse M-F, 5 hours) Type of Home: House Home Access: Level entry     Home Layout: One level     Bathroom Shower/Tub: Teacher, early years/pre: Standard     Home Equipment: Conservation officer, nature (2 wheels);Tub bench;Hospital bed;Grab bars - tub/shower;BSC/3in1;Transport chair;Wheelchair - manual;Hand held shower head (gait belts, 2 BSC, mobile help device)   Additional Comments: The pt lives with her daughter in a ground floor apartment. The pt has a home health nurse 5 days per week, for ~5 hrs each day who assisted her with bathing, lower body dressing, and household chores. The pt ambulated household distances with supervision using a RW. The pt was independent with feeding & required occasional assistance with toileting.(information taken from prior admission)      Prior Functioning/Environment Prior Level of Function : Needs assist  Cognitive Assist : Mobility (cognitive);ADLs (cognitive) Mobility (Cognitive): Intermittent cues ADLs (Cognitive): Step by step cues       Mobility Comments: Walks with RW with Modif I but typically has family with her ADLs Comments: has 24/7  care givers; was active with [facility] services since last hospitalization        OT Problem List: Decreased strength;Decreased range of motion;Decreased activity tolerance;Impaired balance (sitting and/or standing);Decreased cognition;Decreased safety awareness;Decreased knowledge of use of DME or AE;Pain      OT Treatment/Interventions: Self-care/ADL training;Therapeutic exercise;DME and/or AE instruction;Therapeutic activities;Patient/family education;Balance training    OT Goals(Current goals can be found in the care plan section) Acute Rehab OT Goals Patient Stated Goal: per daughter to go to rehab OT Goal Formulation: With patient/family Time For Goal Achievement: 10/17/22 Potential to Achieve Goals: Good  OT Frequency: Min 2X/week    Co-evaluation              AM-PAC OT "6 Clicks" Daily Activity     Outcome Measure Help from another person eating meals?: A Little Help from another person taking care of personal grooming?: A Little Help from another person toileting, which includes using toliet, bedpan, or urinal?: Total Help from another person bathing (including washing, rinsing, drying)?: A Lot Help from another person to put on and taking off regular upper body clothing?: A Lot Help from another person to put on and taking off regular lower body clothing?: Total 6 Click Score: 12   End of Session Equipment Utilized During Treatment: Gait belt;Rolling walker (2 wheels);Oxygen (2L) Nurse Communication: Mobility status  Activity Tolerance: Patient limited by fatigue;Patient limited by pain Patient left: in chair;with call bell/phone within reach;with chair alarm set  OT Visit Diagnosis: Unsteadiness on feet (R26.81);Other abnormalities of gait and mobility (R26.89);Muscle weakness (generalized) (M62.81);History of falling (Z91.81);Other symptoms and signs involving cognitive function;Pain Pain - Right/Left: Left Pain - part of body: Hip                Time: 0981-1914 OT  Time Calculation (min): 30 min Charges:  OT General Charges $OT Visit: 1 Visit OT Evaluation $OT Eval Moderate Complexity: 1 Mod OT Treatments $Self Care/Home Management : 8-22 mins  Maurie Boettcher, OT/L   Acute OT Clinical Specialist [place] Pager [phone] Office [phone]   [facility] 10/03/2022, 11:47 AM

## 2022-10-03 NOTE — Progress Notes (Signed)
PROGRESS NOTE    Christine Newton  SEG:[id number] DOB: 05-Dec-1933 DOA: 10/01/2022 PCP: Lillard Anes, MD   Brief Narrative:  88 year old with history of HTN, HLD, aortic stenosis status post aortic valve repair, TIA, dementia, DM 2 presented after a fall at home.  Poor historian due to dementia.  Denies any loss of consciousness.  X-ray of the left hip showed intertrochanteric left femur fracture.  CT head and cervical spine did not show any acute pathology.  Orthopedic consulted for surgery.  Underwent left hip cephalomedullary nail placement 10/8.   Assessment & Plan:  Principal Problem:   Closed left femoral fracture (HCC) Active Problems:   Essential hypertension   Hyperlipidemia   S/P AVR (aortic valve replacement)   History of TIA (transient ischemic attack)   Dementia (HCC)   Non-insulin dependent type 2 diabetes mellitus (HCC)   Closed fracture of left hip ([place])    Fall Left intratrochanteric hip fracture status post left hip cephalomedullary nail placement 10/8 -Secondary to fall at home.  CT head and cervical spine negative for acute pathology.  Status post ORIF on 10/8, patient will be PT/OT.  Pain management by orthopedic along with postop DVT prophylaxis and weightbearing precautions. - CK normal -Spoke with outpatient cardiology Dr. Daneen Schick who states patient had wide-complex tachycardia on her monitor.  He will get the full results and get back to medical team if any further work-up is indicated   Leukocytosis - Suspect reactive without any evidence of infection.  Procalcitonin negative.  UA still pending.  Mild AKI - Baseline creatinine 0.8, today to 1.32.  We will give IV fluids and hold lisinopril, instead we will hospitalize will use Norvasc   Hypertension - Replace lisinopril with Norvasc due to AKI.  Okay to place back on lisinopril upon discharge    Hyperlipidemia -Lipitor   History of TIA -Aspirin on hold.  Continue statin   Diabetes  mellitus type II -Sliding scale and Accu-Cheks   Dementia Aortic stenosis status post aortic valve repair -Supportive care   DVT prophylaxis: Postop prophylaxis per orthopedic Code Status: DNR Family Communication:  Called Vikki- updated.   Status is: Inpatient PT/OT pending thereafter likely placement  Subjective: Seen and examined at bedside, poor oral intake.  Overall feels weak.  No new complaints   Examination: Constitutional: Not in acute distress Respiratory: Mild bibasilar crackles Cardiovascular: Normal sinus rhythm, no rubs Abdomen: Nontender nondistended good bowel sounds Musculoskeletal: No edema noted Skin: Surgical site looks okay Neurologic: CN 2-12 grossly intact.  And nonfocal Psychiatric: Normal judgment and insight. Alert and oriented x 3. Normal mood.    Objective: Vitals:   10/02/22 2331 10/03/22 0130 10/03/22 0530 10/03/22 0815  BP: 100/61 107/61 115/68 116/70  Pulse: (!) 102 94 88 95  Resp:    16  Temp: (!) 97.4 F (36.3 C) 98.7 F (37.1 C) 98.2 F (36.8 C) 98.1 F (36.7 C)  TempSrc: Oral Axillary Axillary Oral  SpO2: 100% 100% 100% 100%  Weight:      Height:        Intake/Output Summary (Last 24 hours) at 10/03/2022 0819 Last data filed at 10/02/2022 2332 Gross per 24 hour  Intake 1390 ml  Output 350 ml  Net 1040 ml   Filed Weights   10/02/22 0830  Weight: 64.4 kg     Data Reviewed:   CBC: Recent Labs  Lab 10/01/22 1755 10/02/22 0242 10/03/22 0030  WBC 23.2* 22.2* 19.1*  NEUTROABS 19.8*  --   --  HGB 10.7* 10.6* 7.7*  HCT 34.1* 32.9* 24.9*  MCV 87.4 87.5 89.6  PLT 259 240 315   Basic Metabolic Panel: Recent Labs  Lab 10/01/22 1755 10/02/22 0242 10/03/22 0030  NA 143 141 141  K 3.5 3.6 4.5  CL 114* 109 111  CO2 '22 23 22  '$ GLUCOSE 207* 159* 160*  BUN 33* 25* 33*  CREATININE 0.79 0.83 1.32*  CALCIUM 8.7* 9.0 8.2*  MG  --   --  1.9   GFR: Estimated Creatinine Clearance: 28.1 mL/min (A) (by C-G formula based  on SCr of 1.32 mg/dL (H)). Liver Function Tests: No results for input(s): "AST", "ALT", "ALKPHOS", "BILITOT", "PROT", "ALBUMIN" in the last 168 hours. No results for input(s): "LIPASE", "AMYLASE" in the last 168 hours. No results for input(s): "AMMONIA" in the last 168 hours. Coagulation Profile: Recent Labs  Lab 10/01/22 1755  INR 1.0   Cardiac Enzymes: Recent Labs  Lab 10/02/22 1249  CKTOTAL 67   BNP (last 3 results) No results for input(s): "PROBNP" in the last 8760 hours. HbA1C: No results for input(s): "HGBA1C" in the last 72 hours. CBG: Recent Labs  Lab 10/02/22 1127 10/02/22 1641 10/02/22 2134 10/02/22 2359 10/03/22 0408  GLUCAP 140* 165* 159* 139* 128*   Lipid Profile: No results for input(s): "CHOL", "HDL", "LDLCALC", "TRIG", "CHOLHDL", "LDLDIRECT" in the last 72 hours. Thyroid Function Tests: No results for input(s): "TSH", "T4TOTAL", "FREET4", "T3FREE", "THYROIDAB" in the last 72 hours. Anemia Panel: No results for input(s): "VITAMINB12", "FOLATE", "FERRITIN", "TIBC", "IRON", "RETICCTPCT" in the last 72 hours. Sepsis Labs: Recent Labs  Lab 10/02/22 1249  PROCALCITON <0.10    No results found for this or any previous visit (from the past 240 hour(s)).       Radiology Studies: DG FEMUR MIN 2 VIEWS LEFT  Result Date: 10/02/2022 CLINICAL DATA:  Operative imaging for left proximal femur fracture ORIF. EXAM: OPERATIVE LEFT HIP (WITH PELVIS IF PERFORMED) 4 VIEWS TECHNIQUE: Fluoroscopic spot image(s) were submitted for interpretation post-operatively. Fluoro time: 161 seconds.  Dose: 17.7 mGy. COMPARISON:  10/01/2022. FINDINGS: Submitted images show placement a long intramedullary rod supporting a compression screw, reducing the primary intertrochanteric fracture components into near anatomic alignment. The orthopedic hardware is well seated and positioned. IMPRESSION: Well aligned left proximal femur intertrochanteric fracture following ORIF. Electronically  Signed   By: Lajean Manes M.D.   On: 10/02/2022 11:15   DG C-Arm 1-60 Min-No Report  Result Date: 10/02/2022 Fluoroscopy was utilized by the requesting physician.  No radiographic interpretation.   DG C-Arm 1-60 Min-No Report  Result Date: 10/02/2022 Fluoroscopy was utilized by the requesting physician.  No radiographic interpretation.   DG Ribs Unilateral W/Chest Left  Result Date: 10/01/2022 CLINICAL DATA:  Pt to ED via EMS from home c/o fall from dinning room chair. Pt was trying to sit down and missed the chair. Not on blood thinners . No LOC. Now c/o pain to right knee, LT hip/knee and ribs. EXAM: LEFT RIBS AND CHEST - 3+ VIEW COMPARISON:  Chest radiograph, 01/19/2022 FINDINGS: No convincing acute fracture. Old, healed fracture of the anterior left tenth rib. No bone lesion. Previous cardiac surgery and valve replacement. There is opacity at the left lung base consistent with loculated pleural fluid. Lungs otherwise clear. No pneumothorax. IMPRESSION: 1. No acute fracture. 2. No acute cardiopulmonary disease. Electronically Signed   By: Lajean Manes M.D.   On: 10/01/2022 17:38   DG Knee Complete 4 Views Right  Result Date: 10/01/2022 CLINICAL  DATA:  Pt to ED via EMS from home c/o fall from dinning room chair. Pt was trying to sit down and missed the chair. Not on blood thinners . No LOC. Now c/o pain to right knee, LT hip/knee and ribs. EXAM: RIGHT KNEE - COMPLETE 4+ VIEW COMPARISON:  None Available. FINDINGS: No fracture. No bone lesion. Skeletal structures are demineralized. Knee joint normally spaced and aligned.  No joint effusion. Posterior arterial vascular calcifications. IMPRESSION: No fracture or dislocation. Electronically Signed   By: Lajean Manes M.D.   On: 10/01/2022 17:37   DG Knee Complete 4 Views Left  Result Date: 10/01/2022 CLINICAL DATA:  Pt to ED via EMS from home c/o fall from dinning room chair. Pt was trying to sit down and missed the chair. Not on blood thinners .  No LOC. Now c/o pain to right knee, LT hip/knee and ribs. EXAM: LEFT KNEE - COMPLETE 4+ VIEW COMPARISON:  None Available. FINDINGS: No fracture. No bone lesion. Skeletal structures are demineralized. Knee joint normally spaced and aligned. No significant arthropathic changes. No joint effusion. Posterior arterial vascular calcifications. IMPRESSION: No fracture or dislocation. Electronically Signed   By: Lajean Manes M.D.   On: 10/01/2022 17:36   DG Hip Unilat W or Wo Pelvis 2-3 Views Left  Result Date: 10/01/2022 CLINICAL DATA:  Pt to ED via EMS from home c/o fall from dinning room chair. Pt was trying to sit down and missed the chair. Not on blood thinners . No LOC. Now c/o pain to right knee, LT hip/knee and ribs. EXAM: DG HIP (WITH OR WITHOUT PELVIS) 2-3V LEFT COMPARISON:  None Available. FINDINGS: Comminuted displaced intertrochanteric fracture of the proximal left femur. Displacement relatively mild, proximally 1.5 cm. Separate fracture components of the lesser and greater trochanters. Minor varus angulation. No other fractures. There is diffuse sclerosis along the left femoral shaft of unclear etiology and chronicity. No lytic lesions. Remaining skeletal structures are diffusely demineralized. Hip joints, SI joints and symphysis pubis are normally aligned. Extensive arterial vascular calcifications. IMPRESSION: 1. Comminuted, mildly displaced and mildly varus angulated intertrochanteric fracture of the proximal left femur. Electronically Signed   By: Lajean Manes M.D.   On: 10/01/2022 17:35   CT Cervical Spine Wo Contrast  Result Date: 10/01/2022 CLINICAL DATA:  [place] from chair, dimension EXAM: CT CERVICAL SPINE WITHOUT CONTRAST TECHNIQUE: Multidetector CT imaging of the cervical spine was performed without intravenous contrast. Multiplanar CT image reconstructions were also generated. RADIATION DOSE REDUCTION: This exam was performed according to the departmental dose-optimization program which  includes automated exposure control, adjustment of the mA and/or kV according to patient size and/or use of iterative reconstruction technique. COMPARISON:  01/19/2022 FINDINGS: Alignment: Alignment is grossly anatomic. Skull base and vertebrae: No acute fracture. No primary bone lesion or focal pathologic process. Soft tissues and spinal canal: No prevertebral fluid or swelling. No visible canal hematoma. Multinodular goiter again noted and unchanged. No specific imaging follow-up is required. Disc levels: There is diffuse multilevel spondylosis and facet hypertrophy unchanged since prior exam. Upper chest: Airway is patent.  Lung apices are clear. Other: Reconstructed images demonstrate no additional findings. IMPRESSION: 1. No acute cervical spine fracture. Electronically Signed   By: Randa Ngo M.D.   On: 10/01/2022 17:22   CT Head Wo Contrast  Result Date: 10/01/2022 CLINICAL DATA:  [place] from chair, dementia EXAM: CT HEAD WITHOUT CONTRAST TECHNIQUE: Contiguous axial images were obtained from the base of the skull through the vertex without intravenous contrast.  RADIATION DOSE REDUCTION: This exam was performed according to the departmental dose-optimization program which includes automated exposure control, adjustment of the mA and/or kV according to patient size and/or use of iterative reconstruction technique. COMPARISON:  08/31/2022 FINDINGS: Brain: Stable chronic small-vessel ischemic changes throughout the periventricular white matter and basal ganglia. Prior left parietal cortical infarct. No acute infarct or hemorrhage. Lateral ventricles are unremarkable. Stable sellar mass previously documented as macro adenoma. No acute extra-axial fluid collections. No mass effect. Vascular: Stable atherosclerosis.  No hyperdense vessel. Skull: Stable erosive changes of the left cavernous sinus and sphenoid sinus related to the sellar mass described above. No acute bony abnormality. Sinuses/Orbits: Stable  opacification of the sphenoid sinus. Remaining paranasal sinuses are clear. Other: None. IMPRESSION: 1. No acute intracranial process. 2. Stable chronic ischemic changes as above. 3. Stable sellar mass with erosive changes into the left cavernous sinus and sphenoid sinus, compatible macro adenoma based on previous imaging. Electronically Signed   By: Randa Ngo M.D.   On: 10/01/2022 17:20        Scheduled Meds:  atorvastatin  40 mg Oral Daily   budesonide (PULMICORT) nebulizer solution  0.5 mg Nebulization BID   docusate sodium  100 mg Oral BID   enoxaparin (LOVENOX) injection  30 mg Subcutaneous Daily   insulin aspart  0-9 Units Subcutaneous Q4H   lisinopril  20 mg Oral Daily   Continuous Infusions:   LOS: 2 days   Time spent= 35 mins    Sherrod Toothman Arsenio Loader, MD [facility]  If 7PM-7AM, please contact night-coverage  10/03/2022, 8:19 AM

## 2022-10-03 NOTE — Plan of Care (Signed)
  Problem: Education: Goal: Knowledge of General Education information will improve Description: Including pain rating scale, medication(s)/side effects and non-pharmacologic comfort measures Outcome: Progressing   Problem: Coping: Goal: Level of anxiety will decrease Outcome: Progressing   Problem: Nutrition: Goal: Adequate nutrition will be maintained Outcome: Progressing   Problem: Elimination: Goal: Will not experience complications related to bowel motility Outcome: Progressing Goal: Will not experience complications related to urinary retention Outcome: Progressing   Problem: Pain Managment: Goal: General experience of comfort will improve Outcome: Progressing   Problem: Safety: Goal: Ability to remain free from injury will improve Outcome: Progressing   Problem: Skin Integrity: Goal: Risk for impaired skin integrity will decrease Outcome: Progressing   

## 2022-10-03 NOTE — Telephone Encounter (Signed)
[facility] called to inform you that this patient has been admitted to the Hospital. The patient had a fall and was sent to the ER and believed to have broke a hip and wanted you to know.

## 2022-10-03 NOTE — Evaluation (Signed)
Physical Therapy Evaluation Patient Details Name: Christine Newton MRN: [id number] DOB: 1933/04/03 Today's Date: 10/03/2022  History of Present Illness  Pt is a 88 y.o. F who presents 10/01/2022 after a fall with displaced left intertrochanteric hip fracture now s/p cephalomedullary nail placement. Significant PMH: dementia, TIA, DM2, s/p AVR.  Clinical Impression  Pt admitted now s/p procedure listed above. Pt presents with decreased functional mobility secondary to LLE weakness, pain, poor sitting balance, and impaired cognition (which is likely baseline). Pt requiring maximal assist for bed mobility and lateral scoot transfers from bed to chair towards right. PTA, pt lives with her daughter, has caregiver assist for ADL's, and is ambulatory with RW. Pt would benefit from ST SNF to address deficits, maximize functional mobility and decrease caregiver burden.     Recommendations for follow up therapy are one component of a multi-disciplinary discharge planning process, led by the attending physician.  Recommendations may be updated based on patient status, additional functional criteria and insurance authorization.  Follow Up Recommendations Skilled nursing-short term rehab (<3 hours/day) Can patient physically be transported by private vehicle: No    Assistance Recommended at Discharge Frequent or constant Supervision/Assistance  Patient can return home with the following  A lot of help with walking and/or transfers;A lot of help with bathing/dressing/bathroom    Equipment Recommendations None recommended by PT  Recommendations for Other Services       Functional Status Assessment Patient has had a recent decline in their functional status and demonstrates the ability to make significant improvements in function in a reasonable and predictable amount of time.     Precautions / Restrictions Precautions Precautions: Fall Restrictions Weight Bearing Restrictions: Yes LLE Weight  Bearing: Weight bearing as tolerated      Mobility  Bed Mobility Overal bed mobility: Needs Assistance Bed Mobility: Supine to Sit     Supine to sit: Max assist     General bed mobility comments: Pt requiring step by step cues for initiation, assisting with moving BLE's, hand over hand guidance for reaching for bed rail, pt pushing off with BUE's. Guarding away from L side    Transfers Overall transfer level: Needs assistance Equipment used: None Transfers: Bed to chair/wheelchair/BSC            Lateral/Scoot Transfers: Max assist General transfer comment: MaxA for lateral scoot transfer from bed to chair towards right side via face to face    Ambulation/Gait               General Gait Details: unable  Stairs            Wheelchair Mobility    Modified Rankin (Stroke Patients Only)       Balance Overall balance assessment: Needs assistance Sitting-balance support: Feet supported Sitting balance-[name]: Poor Sitting balance - Comments: right lateral lean, guarding away from left side. assist to reach midline                                     Pertinent Vitals/Pain Pain Assessment Pain Assessment: PAINAD Breathing: normal Negative Vocalization: occasional moan/groan, low speech, negative/disapproving quality Facial Expression: facial grimacing Body Language: tense, distressed pacing, fidgeting Consolability: distracted or reassured by voice/touch PAINAD Score: 5 Pain Location: LLE Pain Descriptors / Indicators: Grimacing, Operative site guarding Pain Intervention(s): Monitored during session, Limited activity within patient's tolerance, RN gave pain meds during session    Home Living Family/patient  expects to be discharged to:: Private residence Living Arrangements: Children Available Help at Discharge: Family;Available 24 hours/day (Nurse M-F, 5 hours) Type of Home: House Home Access: Level entry       Home Layout:  One level Home Equipment: Conservation officer, nature (2 wheels);Tub bench;Hospital bed;Grab bars - tub/shower;BSC/3in1;Transport chair;Wheelchair - manual;Hand held shower head (gait belts, 2 BSC, mobile help device) Additional Comments: The pt lives with her daughter in a ground floor apartment. The pt has a home health nurse 5 days per week, for ~5 hrs each day who assisted her with bathing, lower body dressing, and household chores. The pt ambulated household distances with supervision using a RW. The pt was independent with feeding & required occasional assistance with toileting.(information taken from prior admission)    Prior Function Prior Level of Function : Needs assist             Mobility Comments: Walks with RW with Modif I but typically has family with her ADLs Comments: B/D, toileting, cooking, cleaning     Hand Dominance   Dominant Hand: Left    Extremity/Trunk Assessment   Upper Extremity Assessment Upper Extremity Assessment: Defer to OT evaluation    Lower Extremity Assessment Lower Extremity Assessment: LLE deficits/detail LLE Deficits / Details: Hip/knee grossly 1/5, ankle dorsiflexion WFL    Cervical / Trunk Assessment Cervical / Trunk Assessment: Normal  Communication   Communication: No difficulties  Cognition Arousal/Alertness: Awake/alert Behavior During Therapy: WFL for tasks assessed/performed Overall Cognitive Status: History of cognitive impairments - at baseline                                 General Comments: Decreased initiation, responds to questions when asked, although not always accurate, follows 1 step commands ~75% of the time        General Comments General comments (skin integrity, edema, etc.): On 2L O2    Exercises General Exercises - Lower Extremity Ankle Circles/Pumps: AROM, Left, 20 reps, Supine Heel Slides: AAROM, Left, 10 reps, Supine Hip ABduction/ADduction: AAROM, Left, 10 reps, Supine   Assessment/Plan    PT  Assessment Patient needs continued PT services  PT Problem List Decreased activity tolerance;Decreased range of motion;Decreased strength;Decreased balance;Decreased mobility;Decreased cognition;Pain       PT Treatment Interventions DME instruction;Gait training;Therapeutic activities;Functional mobility training;Therapeutic exercise;Balance training;Patient/family education    PT Goals (Current goals can be found in the Care Plan section)  Acute Rehab PT Goals Patient Stated Goal: did not state PT Goal Formulation: With patient Time For Goal Achievement: 10/17/22 Potential to Achieve Goals: Fair    Frequency Min 3X/week     Co-evaluation               AM-PAC PT "6 Clicks" Mobility  Outcome Measure Help needed turning from your back to your side while in a flat bed without using bedrails?: A Lot Help needed moving from lying on your back to sitting on the side of a flat bed without using bedrails?: A Lot Help needed moving to and from a bed to a chair (including a wheelchair)?: A Lot Help needed standing up from a chair using your arms (e.g., wheelchair or bedside chair)?: Total Help needed to walk in hospital room?: Total Help needed climbing 3-5 steps with a railing? : Total 6 Click Score: 9    End of Session Equipment Utilized During Treatment: Gait belt Activity Tolerance: Patient tolerated treatment well Patient left: in  chair;with call bell/phone within reach;with chair alarm set Nurse Communication: Mobility status;Other (comment) (pain meds) PT Visit Diagnosis: Other abnormalities of gait and mobility (R26.89);History of falling (Z91.81);Difficulty in walking, not elsewhere classified (R26.2);Pain Pain - Right/Left: Left Pain - part of body: Hip    Time: 0447-1580 PT Time Calculation (min) (ACUTE ONLY): 25 min   Charges:   PT Evaluation $PT Eval Moderate Complexity: 1 Mod PT Treatments $Therapeutic Activity: 8-22 mins        Wyona Almas, PT,  DPT Acute Rehabilitation Services Office [phone]   Deno Etienne 10/03/2022, 9:02 AM

## 2022-10-03 NOTE — Progress Notes (Signed)
   10/03/22 1645  Assess: MEWS Score  Temp 98.3 F (36.8 C)  BP (!) 109/59  MAP (mmHg) 75  Pulse Rate (!) 115  Resp 16  SpO2 100 %  O2 Device Room Air  Assess: MEWS Score  MEWS Temp 0  MEWS Systolic 0  MEWS Pulse 2  MEWS RR 0  MEWS LOC 0  MEWS Score 2  MEWS Score Color Yellow  Assess: if the MEWS score is Yellow or Red  Were vital signs taken at a resting state? Yes  Focused Assessment No change from prior assessment  Does the patient meet 2 or more of the SIRS criteria? No  MEWS guidelines implemented *See Row Information* Yes  Treat  MEWS Interventions Administered prn meds/treatments;Escalated (See documentation below)  Pain Scale Faces  Faces Pain Scale 6  Pain Type Surgical pain  Pain Location Leg  Pain Orientation Left  Pain Intervention(s) Medication (See eMAR)  Escalate  MEWS: Escalate Yellow: discuss with charge nurse/RN and consider discussing with provider and RRT  Notify: Charge Nurse/RN  Name of Charge Nurse/RN Notified Sharee Pimple RN  Date Charge Nurse/RN Notified 10/03/22  Time Charge Nurse/RN Notified 1715  Notify: Provider  Provider Name/Title Dr. Reesa Chew  Date Provider Notified 10/03/22  Time Provider Notified 1518  Method of Notification Page  Notification Reason Other (Comment) (yellow MEWS, elevated HR)  Provider response See new orders  Assess: SIRS CRITERIA  SIRS Temperature  0  SIRS Pulse 1  SIRS Respirations  0  SIRS WBC 1  SIRS Score Sum  2

## 2022-10-03 NOTE — Progress Notes (Signed)
   Subjective:  Sleeping comfortably on arrival. Pain well controlled. Tolerated PO. Pending PT eval.  Objective:   VITALS:   Vitals:   10/02/22 2131 10/02/22 2331 10/03/22 0130 10/03/22 0530  BP: (!) 74/53 100/61 107/61 115/68  Pulse: (!) 107 (!) 102 94 88  Resp: 18     Temp: 98.3 F (36.8 C) (!) 97.4 F (36.3 C) 98.7 F (37.1 C) 98.2 F (36.8 C)  TempSrc: Oral Oral Axillary Axillary  SpO2: 100% 100% 100% 100%  Weight:      Height:        Neurologically intact Dressing CDI Limb lengths equal   Lab Results  Component Value Date   WBC 19.1 (H) 10/03/2022   HGB 7.7 (L) 10/03/2022   HCT 24.9 (L) 10/03/2022   MCV 89.6 10/03/2022   PLT 185 10/03/2022     Assessment/Plan:  1 Day Post-Op status post left hip cephulomedullary nailing  - Expected postop acute blood loss anemia - will monitor for symptoms, may require a transfusion, please recheck CBC 10/10 - Patient to work with PT to optimize mobilization safely - DVT ppx - SCDs, ambulation, Lovenox - WBAT operative extremity - Pain control - multimodal pain management, ATC acetaminophen in conjunction with as needed narcotic (oxycodone), although this should be minimized with other modalities   Christine Newton 10/03/2022, 6:37 AM

## 2022-10-03 NOTE — Progress Notes (Signed)
   10/02/22 2131  Assess: MEWS Score  Temp 98.3 F (36.8 C)  BP (!) 74/53  MAP (mmHg) (!) 61  Pulse Rate (!) 107  Resp 18  Level of Consciousness Alert  SpO2 100 %  O2 Device Nasal Cannula  O2 Flow Rate (L/min) 2 L/min   Pt sleeping, no complaints of pain. Clarene Essex notified of BP. 250 ml NS bolus ordered. BP improved to 100/61, HR 102 after bolus and subsequent BP 107/61, HR 94. No IVF ordered post bolus.

## 2022-10-04 ENCOUNTER — Encounter (HOSPITAL_COMMUNITY): Payer: Self-pay | Admitting: Orthopaedic Surgery

## 2022-10-04 DIAGNOSIS — S72142A Displaced intertrochanteric fracture of left femur, initial encounter for closed fracture: Secondary | ICD-10-CM | POA: Diagnosis not present

## 2022-10-04 DIAGNOSIS — S72002D Fracture of unspecified part of neck of left femur, subsequent encounter for closed fracture with routine healing: Secondary | ICD-10-CM | POA: Diagnosis not present

## 2022-10-04 DIAGNOSIS — I10 Essential (primary) hypertension: Secondary | ICD-10-CM | POA: Diagnosis not present

## 2022-10-04 LAB — BASIC METABOLIC PANEL
Anion gap: 8 (ref 5–15)
BUN: 46 mg/dL — ABNORMAL HIGH (ref 8–23)
CO2: 22 mmol/L (ref 22–32)
Calcium: 7.7 mg/dL — ABNORMAL LOW (ref 8.9–10.3)
Chloride: 112 mmol/L — ABNORMAL HIGH (ref 98–111)
Creatinine, Ser: 1.24 mg/dL — ABNORMAL HIGH (ref 0.44–1.00)
GFR, Estimated: 42 mL/min — ABNORMAL LOW (ref >60)
Glucose, Bld: 151 mg/dL — ABNORMAL HIGH (ref 70–99)
Potassium: 3.4 mmol/L — ABNORMAL LOW (ref 3.5–5.1)
Sodium: 142 mmol/L (ref 135–145)

## 2022-10-04 LAB — HEMOGLOBIN AND HEMATOCRIT, BLOOD
HCT: 25.8 % — ABNORMAL LOW (ref 36.0–46.0)
Hemoglobin: 8.7 g/dL — ABNORMAL LOW (ref 12.0–15.0)

## 2022-10-04 LAB — GLUCOSE, CAPILLARY
Glucose-Capillary: 116 mg/dL — ABNORMAL HIGH (ref 70–99)
Glucose-Capillary: 105 mg/dL — ABNORMAL HIGH (ref 70–99)
Glucose-Capillary: 117 mg/dL — ABNORMAL HIGH (ref 70–99)
Glucose-Capillary: 116 mg/dL — ABNORMAL HIGH (ref 70–99)
Glucose-Capillary: 170 mg/dL — ABNORMAL HIGH (ref 70–99)

## 2022-10-04 LAB — CBC
HCT: 19.8 % — ABNORMAL LOW (ref 36.0–46.0)
Hemoglobin: 6.5 g/dL — CL (ref 12.0–15.0)
MCH: 28.5 pg (ref 26.0–34.0)
MCHC: 32.8 g/dL (ref 30.0–36.0)
MCV: 86.8 fL (ref 80.0–100.0)
Platelets: 154 10*3/uL (ref 150–400)
RBC: 2.28 MIL/uL — ABNORMAL LOW (ref 3.87–5.11)
RDW: 15.5 % (ref 11.5–15.5)
WBC: 20.3 10*3/uL — ABNORMAL HIGH (ref 4.0–10.5)
nRBC: 0.2 % (ref 0.0–0.2)

## 2022-10-04 LAB — MAGNESIUM: Magnesium: 2.1 mg/dL (ref 1.7–2.4)

## 2022-10-04 LAB — PREPARE RBC (CROSSMATCH)

## 2022-10-04 MED ORDER — SODIUM CHLORIDE 0.9% IV SOLUTION: Freq: Once | INTRAVENOUS | Status: AC

## 2022-10-04 MED ORDER — POTASSIUM CHLORIDE 10 MEQ/100ML IV SOLN
10.0000 meq | INTRAVENOUS | Status: DC
  Filled 2022-10-04: qty 100

## 2022-10-04 MED ORDER — SODIUM CHLORIDE 0.9 % IV SOLN: INTRAVENOUS | Status: DC

## 2022-10-04 MED ORDER — POTASSIUM CHLORIDE 10 MEQ/100ML IV SOLN
10.0000 meq | INTRAVENOUS | Status: AC
  Administered 2022-10-04 (×4): 10 meq via INTRAVENOUS
  Filled 2022-10-04 (×3): qty 100

## 2022-10-04 NOTE — Progress Notes (Signed)
PROGRESS NOTE    Christine Newton  DZH:[id number] DOB: 01/05/1933 DOA: 10/01/2022 PCP: Lillard Anes, MD   Brief Narrative:  88 year old with history of HTN, HLD, aortic stenosis status post aortic valve repair, TIA, dementia, DM 2 presented after a fall at home.  Poor historian due to dementia.  Denies any loss of consciousness.  X-ray of the left hip showed intertrochanteric left femur fracture.  CT head and cervical spine did not show any acute pathology.  Orthopedic consulted for surgery.  Underwent left hip cephalomedullary nail placement 10/8.  Postoperatively patient hemoglobin dropped to 6.5 requiring 1 unit PRBC transfusion.  Also had low urine output requiring IV fluids.   Assessment & Plan:  Principal Problem:   Closed left femoral fracture (HCC) Active Problems:   Essential hypertension   Hyperlipidemia   S/P AVR (aortic valve replacement)   History of TIA (transient ischemic attack)   Dementia (HCC)   Non-insulin dependent type 2 diabetes mellitus (HCC)   Closed fracture of left hip ([place])    Fall Left intratrochanteric hip fracture status post left hip cephalomedullary nail placement 10/8 -Secondary to fall at home.  CT head and cervical spine negative for acute pathology.  Status post ORIF on 10/8, Pain management by orthopedic along with postop DVT prophylaxis and weightbearing precautions.  PT/OT recommending SNF - CK normal -Spoke with outpatient cardiology Dr. Daneen Schick who states patient had wide-complex tachycardia on her monitor.  He will get the full results and get back to medical team if any further work-up is indicated  Acute blood loss anemia - Likely from surgical loss and ongoing acute illness.  Recommend 1 unit PRBC transfusion.   Leukocytosis - Suspect reactive without any evidence of infection.  Procalcitonin negative.  UA negative  Mild AKI - Baseline creatinine 0.8, creatinine slowly trending down, 1.24.  Continue IV fluid, holding  lisinopril.   Hypertension - Replace lisinopril with Norvasc due to AKI.  Okay to place back on lisinopril upon discharge    Hyperlipidemia -Lipitor   History of TIA -Aspirin on hold.  Continue statin   Diabetes mellitus type II -Sliding scale and Accu-Cheks   Dementia Aortic stenosis status post aortic valve repair -Supportive care   DVT prophylaxis: Lovenox Code Status: DNR Family Communication: Met with the daughter at bedside  Status is: Inpatient PT/OT pending thereafter likely placement  Subjective: Seen and examined at bedside, no new complaints this morning.  Overnight had mild delirium Examination: Constitutional: Not in acute distress Respiratory: Clear to auscultation bilaterally Cardiovascular: Normal sinus rhythm, no rubs Abdomen: Nontender nondistended good bowel sounds Musculoskeletal: No edema noted Skin: Left-sided surgical site looks okay.  No active evidence of bleeding Neurologic: CN 2-12 grossly intact.  And nonfocal Psychiatric: Normal judgment and insight. Alert and oriented x 3. Normal mood. Objective: Vitals:   10/03/22 2025 10/04/22 0418 10/04/22 0729 10/04/22 0800  BP:  127/69 126/63   Pulse:  (!) 115 (!) 115   Resp:  20 20   Temp:  98.8 F (37.1 C) 98.2 F (36.8 C)   TempSrc:  Oral Oral   SpO2: 99% 92% 91% 100%  Weight:      Height:        Intake/Output Summary (Last 24 hours) at 10/04/2022 0823 Last data filed at 10/04/2022 0418 Gross per 24 hour  Intake 627.51 ml  Output 350 ml  Net 277.51 ml   Filed Weights   10/02/22 0830  Weight: 64.4 kg     Data  Reviewed:   CBC: Recent Labs  Lab 10/01/22 1755 10/02/22 0242 10/03/22 0030 10/04/22 0038  WBC 23.2* 22.2* 19.1* 20.3*  NEUTROABS 19.8*  --   --   --   HGB 10.7* 10.6* 7.7* 6.5*  HCT 34.1* 32.9* 24.9* 19.8*  MCV 87.4 87.5 89.6 86.8  PLT 259 240 185 856   Basic Metabolic Panel: Recent Labs  Lab 10/01/22 1755 10/02/22 0242 10/03/22 0030 10/04/22 0038  NA  143 141 141 142  K 3.5 3.6 4.5 3.4*  CL 114* 109 111 112*  CO2 '22 23 22 22  ' GLUCOSE 207* 159* 160* 151*  BUN 33* 25* 33* 46*  CREATININE 0.79 0.83 1.32* 1.24*  CALCIUM 8.7* 9.0 8.2* 7.7*  MG  --   --  1.9 2.1   GFR: Estimated Creatinine Clearance: 29.9 mL/min (A) (by C-G formula based on SCr of 1.24 mg/dL (H)). Liver Function Tests: No results for input(s): "AST", "ALT", "ALKPHOS", "BILITOT", "PROT", "ALBUMIN" in the last 168 hours. No results for input(s): "LIPASE", "AMYLASE" in the last 168 hours. No results for input(s): "AMMONIA" in the last 168 hours. Coagulation Profile: Recent Labs  Lab 10/01/22 1755  INR 1.0   Cardiac Enzymes: Recent Labs  Lab 10/02/22 1249  CKTOTAL 67   BNP (last 3 results) No results for input(s): "PROBNP" in the last 8760 hours. HbA1C: No results for input(s): "HGBA1C" in the last 72 hours. CBG: Recent Labs  Lab 10/03/22 1646 10/03/22 2023 10/03/22 2344 10/04/22 0433 10/04/22 0744  GLUCAP 174* 187* 139* 116* 105*   Lipid Profile: No results for input(s): "CHOL", "HDL", "LDLCALC", "TRIG", "CHOLHDL", "LDLDIRECT" in the last 72 hours. Thyroid Function Tests: No results for input(s): "TSH", "T4TOTAL", "FREET4", "T3FREE", "THYROIDAB" in the last 72 hours. Anemia Panel: No results for input(s): "VITAMINB12", "FOLATE", "FERRITIN", "TIBC", "IRON", "RETICCTPCT" in the last 72 hours. Sepsis Labs: Recent Labs  Lab 10/02/22 1249  PROCALCITON <0.10    No results found for this or any previous visit (from the past 240 hour(s)).       Radiology Studies: DG FEMUR MIN 2 VIEWS LEFT  Result Date: 10/02/2022 CLINICAL DATA:  Operative imaging for left proximal femur fracture ORIF. EXAM: OPERATIVE LEFT HIP (WITH PELVIS IF PERFORMED) 4 VIEWS TECHNIQUE: Fluoroscopic spot image(s) were submitted for interpretation post-operatively. Fluoro time: 161 seconds.  Dose: 17.7 mGy. COMPARISON:  10/01/2022. FINDINGS: Submitted images show placement a long  intramedullary rod supporting a compression screw, reducing the primary intertrochanteric fracture components into near anatomic alignment. The orthopedic hardware is well seated and positioned. IMPRESSION: Well aligned left proximal femur intertrochanteric fracture following ORIF. Electronically Signed   By: Lajean Manes M.D.   On: 10/02/2022 11:15   DG C-Arm 1-60 Min-No Report  Result Date: 10/02/2022 Fluoroscopy was utilized by the requesting physician.  No radiographic interpretation.   DG C-Arm 1-60 Min-No Report  Result Date: 10/02/2022 Fluoroscopy was utilized by the requesting physician.  No radiographic interpretation.        Scheduled Meds:  sodium chloride   Intravenous Once   amLODipine  5 mg Oral Daily   atorvastatin  40 mg Oral Daily   budesonide (PULMICORT) nebulizer solution  0.5 mg Nebulization BID   docusate sodium  100 mg Oral BID   enoxaparin (LOVENOX) injection  30 mg Subcutaneous Daily   insulin aspart  0-9 Units Subcutaneous Q4H   Continuous Infusions:  sodium chloride 75 mL/hr at 10/03/22 2134   potassium chloride       LOS: 3  days   Time spent= 35 mins    Tee Richeson Arsenio Loader, MD [facility]  If 7PM-7AM, please contact night-coverage  10/04/2022, 8:23 AM

## 2022-10-04 NOTE — Progress Notes (Signed)
Pt's daughter asked if there are any alternatives to her mother receiving a unit of blood for her hemoglobin level that's currently 6.5. The daughter and pt are not against receiving blood they just would like to know all of their options. Reached out to on call provider to inform of daughter's question regarding the pt receiving blood.   After speaking with the provider,  pt's daughter and the pt have decided to speak with the attending doctor in the morning to discuss whether or not  pt should receive blood.

## 2022-10-04 NOTE — Care Management Important Message (Signed)
Important Message  Patient Details  Name: Christine Newton MRN: [id number] Date of Birth: [birth year]   Medicare Important Message Given:  Yes     Hannah Beat 10/04/2022, 10:58 AM

## 2022-10-04 NOTE — Progress Notes (Signed)
CRITICAL VALUE STICKER  CRITICAL VALUE: 6.5  RECEIVER (on-site recipient of call): T.Glorianne Proctor,RN  DATE & TIME NOTIFIED: 10/04/2022 @ 0137   MESSENGER (representative from lab): Susy Manor  MD NOTIFIED: Clarene Essex, NP  TIME OF NOTIFICATION: [phone]  RESPONSE: awaiting response

## 2022-10-04 NOTE — Progress Notes (Signed)
PIV consult: Pt receiving transfusion, IV meds ordered. Collaborated with Teodoro Spray, RN: Plan is to stagger infusions to avoid additional sticks. She will consult IV Team if second access is needed.

## 2022-10-04 NOTE — Progress Notes (Signed)
   10/04/22 0729  Assess: MEWS Score  Temp 98.2 F (36.8 C)  BP 126/63  MAP (mmHg) 82  Pulse Rate (!) 115  Resp 20  SpO2 91 %  O2 Device Room Air  Assess: MEWS Score  MEWS Temp 0  MEWS Systolic 0  MEWS Pulse 2  MEWS RR 0  MEWS LOC 0  MEWS Score 2  MEWS Score Color Yellow  Assess: if the MEWS score is Yellow or Red  Were vital signs taken at a resting state? Yes  Focused Assessment No change from prior assessment  Does the patient meet 2 or more of the SIRS criteria? No  MEWS guidelines implemented *See Row Information* Yes  Treat  MEWS Interventions Administered prn meds/treatments;Escalated (See documentation below)  Take Vital Signs  Increase Vital Sign Frequency  Yellow: Q 2hr X 2 then Q 4hr X 2, if remains yellow, continue Q 4hrs  Escalate  MEWS: Escalate Yellow: discuss with charge nurse/RN and consider discussing with provider and RRT  Notify: Charge Nurse/RN  Name of Charge Nurse/RN Notified Ashli, RN  Date Charge Nurse/RN Notified 10/04/22  Time Charge Nurse/RN Notified 0758  Assess: SIRS CRITERIA  SIRS Temperature  0  SIRS Pulse 1  SIRS Respirations  0  SIRS WBC 1  SIRS Score Sum  2   Yellow MEWS; HR 115 O2 sats 91% on room air. PRN Lopressor given, and patient placed on 2L [place]. [place] Poli, Agricultural consultant made aware. Patient is now maintaining O2 sats 99% and HR is 71.  Per night shift RN patient's daughter was refusing for patient to receive blood transfusion until she spoke with an MD, however patient and her daughter are agreeable to transfusion this morning.

## 2022-10-04 NOTE — Progress Notes (Signed)
   Subjective:  Sleeping comfortably on arrival. Pain well controlled. Hg this am. Tolerated PO. Worked with PT.  Objective:   VITALS:   Vitals:   10/03/22 1849 10/03/22 2002 10/03/22 2025 10/04/22 0418  BP: (!) 97/59 108/64  127/69  Pulse: 91 95  (!) 115  Resp: '16 17  20  '$ Temp:  99.5 F (37.5 C)  98.8 F (37.1 C)  TempSrc:  Oral  Oral  SpO2: 100% 99% 99% 92%  Weight:      Height:        Neurologically intact Dressing CDI Limb lengths equal   Lab Results  Component Value Date   WBC 20.3 (H) 10/04/2022   HGB 6.5 (LL) 10/04/2022   HCT 19.8 (L) 10/04/2022   MCV 86.8 10/04/2022   PLT 154 10/04/2022     Assessment/Plan:  2 Days Post-Op status post left hip cephulomedullary nailing  - Expected postop acute blood loss anemia - recommend transfusion 1 unit pRBCs - Patient to work with PT to optimize mobilization safely - DVT ppx - SCDs, ambulation, Lovenox - WBAT operative extremity - Pain control - multimodal pain management, ATC acetaminophen in conjunction with as needed narcotic (oxycodone), although this should be minimized with other modalities   Tineshia Becraft 10/04/2022, 6:47 AM

## 2022-10-05 ENCOUNTER — Ambulatory Visit: Payer: Medicare PPO | Admitting: Physician Assistant

## 2022-10-05 DIAGNOSIS — I119 Hypertensive heart disease without heart failure: Secondary | ICD-10-CM | POA: Diagnosis not present

## 2022-10-05 DIAGNOSIS — Z9181 History of falling: Secondary | ICD-10-CM

## 2022-10-05 DIAGNOSIS — Z8673 Personal history of transient ischemic attack (TIA), and cerebral infarction without residual deficits: Secondary | ICD-10-CM | POA: Diagnosis not present

## 2022-10-05 DIAGNOSIS — F0390 Unspecified dementia without behavioral disturbance: Secondary | ICD-10-CM | POA: Diagnosis not present

## 2022-10-05 DIAGNOSIS — E042 Nontoxic multinodular goiter: Secondary | ICD-10-CM | POA: Diagnosis not present

## 2022-10-05 DIAGNOSIS — I081 Rheumatic disorders of both mitral and tricuspid valves: Secondary | ICD-10-CM | POA: Diagnosis not present

## 2022-10-05 DIAGNOSIS — I447 Left bundle-branch block, unspecified: Secondary | ICD-10-CM | POA: Diagnosis not present

## 2022-10-05 DIAGNOSIS — Z953 Presence of xenogenic heart valve: Secondary | ICD-10-CM

## 2022-10-05 DIAGNOSIS — E119 Type 2 diabetes mellitus without complications: Secondary | ICD-10-CM | POA: Diagnosis not present

## 2022-10-05 DIAGNOSIS — S72142A Displaced intertrochanteric fracture of left femur, initial encounter for closed fracture: Secondary | ICD-10-CM | POA: Diagnosis not present

## 2022-10-05 DIAGNOSIS — Z7984 Long term (current) use of oral hypoglycemic drugs: Secondary | ICD-10-CM | POA: Diagnosis not present

## 2022-10-05 DIAGNOSIS — Z7982 Long term (current) use of aspirin: Secondary | ICD-10-CM | POA: Diagnosis not present

## 2022-10-05 LAB — BPAM RBC
Blood Product Expiration Date: 202311122359
ISSUE DATE / TIME: 202310101144
Unit Type and Rh: 5100

## 2022-10-05 LAB — GLUCOSE, CAPILLARY
Glucose-Capillary: 101 mg/dL — ABNORMAL HIGH (ref 70–99)
Glucose-Capillary: 105 mg/dL — ABNORMAL HIGH (ref 70–99)
Glucose-Capillary: 132 mg/dL — ABNORMAL HIGH (ref 70–99)
Glucose-Capillary: 143 mg/dL — ABNORMAL HIGH (ref 70–99)
Glucose-Capillary: 148 mg/dL — ABNORMAL HIGH (ref 70–99)
Glucose-Capillary: 96 mg/dL (ref 70–99)

## 2022-10-05 LAB — CBC
HCT: 26.4 % — ABNORMAL LOW (ref 36.0–46.0)
Hemoglobin: 8.6 g/dL — ABNORMAL LOW (ref 12.0–15.0)
MCH: 29.3 pg (ref 26.0–34.0)
MCHC: 32.6 g/dL (ref 30.0–36.0)
MCV: 89.8 fL (ref 80.0–100.0)
Platelets: 169 10*3/uL (ref 150–400)
RBC: 2.94 MIL/uL — ABNORMAL LOW (ref 3.87–5.11)
RDW: 14.9 % (ref 11.5–15.5)
WBC: 21.1 10*3/uL — ABNORMAL HIGH (ref 4.0–10.5)
nRBC: 0.9 % — ABNORMAL HIGH (ref 0.0–0.2)

## 2022-10-05 LAB — TYPE AND SCREEN
ABO/RH(D): O POS
Antibody Screen: NEGATIVE
Unit division: 0

## 2022-10-05 LAB — BASIC METABOLIC PANEL
Anion gap: 5 (ref 5–15)
BUN: 28 mg/dL — ABNORMAL HIGH (ref 8–23)
CO2: 22 mmol/L (ref 22–32)
Calcium: 7.9 mg/dL — ABNORMAL LOW (ref 8.9–10.3)
Chloride: 115 mmol/L — ABNORMAL HIGH (ref 98–111)
Creatinine, Ser: 0.88 mg/dL (ref 0.44–1.00)
GFR, Estimated: >60 mL/min (ref >60)
Glucose, Bld: 110 mg/dL — ABNORMAL HIGH (ref 70–99)
Potassium: 4.3 mmol/L (ref 3.5–5.1)
Sodium: 142 mmol/L (ref 135–145)

## 2022-10-05 LAB — MAGNESIUM: Magnesium: 1.9 mg/dL (ref 1.7–2.4)

## 2022-10-05 MED ORDER — ENOXAPARIN SODIUM 40 MG/0.4ML IJ SOSY
40.0000 mg | PREFILLED_SYRINGE | Freq: Every day | INTRAMUSCULAR | Status: DC
  Administered 2022-10-06 – 2022-10-07 (×2): 40 mg via SUBCUTANEOUS
  Filled 2022-10-05 (×2): qty 0.4

## 2022-10-05 MED ORDER — METOPROLOL TARTRATE 5 MG/5ML IV SOLN: 5.0000 mg | INTRAVENOUS | Status: DC | PRN

## 2022-10-05 MED ORDER — METOPROLOL TARTRATE 12.5 MG HALF TABLET
12.5000 mg | ORAL_TABLET | Freq: Two times a day (BID) | ORAL | Status: DC
  Administered 2022-10-05 (×2): 12.5 mg via ORAL
  Filled 2022-10-05 (×2): qty 1

## 2022-10-05 NOTE — Progress Notes (Signed)
PROGRESS NOTE  Christine Newton NWG:[id number] DOB: 1933/08/30 DOA: 10/01/2022 PCP: Lillard Anes, MD   LOS: 4 days   Brief Narrative / Interim history: 88 year old with history of HTN, HLD, aortic stenosis status post aortic valve repair, TIA, dementia, DM 2 presented after a fall at home.  Poor historian due to dementia.  Denies any loss of consciousness.  X-ray of the left hip showed intertrochanteric left femur fracture.  CT head and cervical spine did not show any acute pathology.  Orthopedic consulted for surgery.  Underwent left hip cephalomedullary nail placement 10/8.  Postoperatively patient hemoglobin dropped to 6.5 requiring 1 unit PRBC transfusion.  Also had low urine output requiring IV fluids.  Subjective / 24h Interval events: Confused this morning.  Tells me she has to get up and do something in [place].  She does not know that she had a fall and had hip repair but is not sure about the timeline  Assesement and Plan: Principal Problem:   Closed left femoral fracture ([place]) Active Problems:   Essential hypertension   Hyperlipidemia   S/P AVR (aortic valve replacement)   History of TIA (transient ischemic attack)   Dementia (HCC)   Non-insulin dependent type 2 diabetes mellitus ([place])   Closed fracture of left hip ([place])  Principal problem Fall, Left intratrochanteric hip fracture status post left hip cephalomedullary nail placement 10/8 -she was admitted to the hospital and orthopedic surgery was consulted.  CT head and cervical spine negative for acute pathology.  Status post ORIF on 10/8, Pain management by orthopedic along with postop DVT prophylaxis and weightbearing precautions.  PT/OT recommending SNF -Dr. Reesa Chew spoke with outpatient cardiology Dr. Daneen Schick who states patient had wide-complex tachycardia on her monitor.  He will get the full results and get back to medical team if any further work-up is indicated   Active problems Acute blood loss anemia  -Likely from surgical loss and ongoing acute illness.  She is status post 1 unit of red blood cells.  Hemoglobin stable  Leukocytosis-Suspect reactive without any evidence of infection.  Procalcitonin negative.  UA negative   Mild AKI -Baseline creatinine 0.8, creatinine up to 1.2, she was given fluids, now back to baseline at 0.8.  Stop fluids   Hypertension-Replace lisinopril with Norvasc due to AKI.  Okay to place back on lisinopril upon discharge    Hyperlipidemia-Lipitor   History of TIA -Aspirin on hold.  Continue statin   Diabetes mellitus type II -Sliding scale and Accu-Cheks   Dementia-stable  Aortic stenosis status post aortic valve repair-Supportive care  Scheduled Meds:  amLODipine  5 mg Oral Daily   atorvastatin  40 mg Oral Daily   budesonide (PULMICORT) nebulizer solution  0.5 mg Nebulization BID   docusate sodium  100 mg Oral BID   enoxaparin (LOVENOX) injection  30 mg Subcutaneous Daily   insulin aspart  0-9 Units Subcutaneous Q4H   Continuous Infusions: PRN Meds:.guaiFENesin, hydrALAZINE, HYDROcodone-acetaminophen, HYDROmorphone (DILAUDID) injection, ipratropium-albuterol, metoprolol tartrate, ondansetron (ZOFRAN) IV, polyethylene glycol, senna-docusate, traZODone  Current Outpatient Medications  Medication Instructions   aspirin EC 81 mg, Oral, Daily, Swallow whole.   atorvastatin (LIPITOR) 40 mg, Oral, Daily   Carboxymethylcellulose Sodium (DRY EYE RELIEF OP) 1 drop, Both Eyes, Daily PRN   lisinopril (ZESTRIL) 20 mg, Oral, Daily   metFORMIN (GLUCOPHAGE) 500 mg, Oral, Daily with breakfast, Take 2 every morning and 1 every evening    Diet Orders (From admission, onward)     Start  Ordered   10/02/22 1328  Diet Carb Modified Fluid consistency: Thin; Room service appropriate? Yes  Diet effective now       Question Answer Comment  Diet-HS Snack? Nothing   Calorie Level Medium 1600-2000   Fluid consistency: Thin   Room service appropriate? Yes       10/02/22 1327            DVT prophylaxis: enoxaparin (LOVENOX) injection 30 mg Start: 10/03/22 1000 SCDs Start: 10/01/22 2242   Lab Results  Component Value Date   PLT 169 10/05/2022      Code Status: DNR  Family Communication: no family at bedside   Status is: Inpatient  Remains inpatient appropriate because: Monitor hemoglobin, SNF within 24 hours if bed available  Level of care: Telemetry Surgical  Consultants:  Orthopedic surgery   Objective: Vitals:   10/05/22 0450 10/05/22 0745 10/05/22 0757 10/05/22 0830  BP: (!) 162/87  (!) 168/89   Pulse: (!) 109  (!) 116 (!) 101  Resp:   17   Temp: 98.4 F (36.9 C)  98.3 F (36.8 C)   TempSrc: Oral  Oral   SpO2: 95% 95% 97%   Weight:      Height:        Intake/Output Summary (Last 24 hours) at 10/05/2022 1036 Last data filed at 10/05/2022 0459 Gross per 24 hour  Intake 1316.78 ml  Output 1075 ml  Net 241.78 ml   Wt Readings from Last 3 Encounters:  10/02/22 64.4 kg  09/19/22 64.4 kg  09/02/22 68 kg    Examination:  Constitutional: NAD Eyes: no scleral icterus ENMT: Mucous membranes are moist.  Neck: normal, supple Respiratory: clear to auscultation bilaterally, no wheezing, no crackles. Normal respiratory effort. No accessory muscle use.  Cardiovascular: Regular rate and rhythm, no murmurs / rubs / gallops. No LE edema.  Abdomen: non distended, no tenderness. Bowel sounds positive.  Musculoskeletal: no clubbing / cyanosis.  Skin: no rashes Neurologic: non focal   Data Reviewed: I have independently reviewed following labs and imaging studies   CBC Recent Labs  Lab 10/01/22 1755 10/02/22 0242 10/03/22 0030 10/04/22 0038 10/04/22 1619 10/05/22 0214  WBC 23.2* 22.2* 19.1* 20.3*  --  21.1*  HGB 10.7* 10.6* 7.7* 6.5* 8.7* 8.6*  HCT 34.1* 32.9* 24.9* 19.8* 25.8* 26.4*  PLT 259 240 185 154  --  169  MCV 87.4 87.5 89.6 86.8  --  89.8  MCH 27.4 28.2 27.7 28.5  --  29.3  MCHC 31.4 32.2 30.9 32.8   --  32.6  RDW 15.6* 15.5 15.5 15.5  --  14.9  LYMPHSABS 1.5  --   --   --   --   --   MONOABS 1.2*  --   --   --   --   --   EOSABS 0.0  --   --   --   --   --   BASOSABS 0.1  --   --   --   --   --     Recent Labs  Lab 10/01/22 1755 10/02/22 0242 10/02/22 1249 10/03/22 0030 10/04/22 0038 10/05/22 0214  NA 143 141  --  141 142 142  K 3.5 3.6  --  4.5 3.4* 4.3  CL 114* 109  --  111 112* 115*  CO2 22 23  --  '22 22 22  '$ GLUCOSE 207* 159*  --  160* 151* 110*  BUN 33* 25*  --  33* 46* 28*  CREATININE 0.79 0.83  --  1.32* 1.24* 0.88  CALCIUM 8.7* 9.0  --  8.2* 7.7* 7.9*  MG  --   --   --  1.9 2.1 1.9  PROCALCITON  --   --  <0.10  --   --   --   INR 1.0  --   --   --   --   --     ------------------------------------------------------------------------------------------------------------------ No results for input(s): "CHOL", "HDL", "LDLCALC", "TRIG", "CHOLHDL", "LDLDIRECT" in the last 72 hours.  Lab Results  Component Value Date   HGBA1C 7.0 (H) 07/12/2022   ------------------------------------------------------------------------------------------------------------------ No results for input(s): "TSH", "T4TOTAL", "T3FREE", "THYROIDAB" in the last 72 hours.  Invalid input(s): "FREET3"  Cardiac Enzymes No results for input(s): "CKMB", "TROPONINI", "MYOGLOBIN" in the last 168 hours.  Invalid input(s): "CK" ------------------------------------------------------------------------------------------------------------------ No results found for: "BNP"  CBG: Recent Labs  Lab 10/04/22 1538 10/04/22 1958 10/04/22 2359 10/05/22 0458 10/05/22 0758  GLUCAP 116* 170* 132* 101* 105*    No results found for this or any previous visit (from the past 240 hour(s)).   Radiology Studies: No results found.  Marzetta Board, MD, PhD [facility]  Between 7 am - 7 pm I am available, please contact me via Amion (for emergencies) or Securechat (non urgent messages)  Between 7  pm - 7 am I am not available, please contact night coverage MD/APP via Amion

## 2022-10-05 NOTE — Progress Notes (Addendum)
Physical Therapy Treatment Patient Details Name: Christine Newton MRN: [id number] DOB: [birth year] Today's Date: 10/05/2022   History of Present Illness Pt is a 88 y.o. F who presents 10/01/2022 after a fall with displaced left intertrochanteric hip fracture now s/p cephalomedullary nail placement. Significant PMH: dementia, TIA, DM2, s/p AVR.    PT Comments    Pt progressing slowly towards her physical therapy goals; remains limited by pain and confusion (RN present during session to provide pain medication). Utilized Stedy to transfer from bed to chair, however, pt guarding heavily away from left side. HR up to 164 bpm, SpO2 95-96% on RA; RN/MD aware. Pt continues with left sided weakness, impaired balance, and decreased activity tolerance. Continue to recommend SNF for ongoing Physical Therapy.      Recommendations for follow up therapy are one component of a multi-disciplinary discharge planning process, led by the attending physician.  Recommendations may be updated based on patient status, additional functional criteria and insurance authorization.  Follow Up Recommendations  Skilled nursing-short term rehab (<3 hours/day) Can patient physically be transported by private vehicle: No   Assistance Recommended at Discharge Frequent or constant Supervision/Assistance  Patient can return home with the following A lot of help with walking and/or transfers;A lot of help with bathing/dressing/bathroom   Equipment Recommendations  None recommended by PT    Recommendations for Other Services       Precautions / Restrictions Precautions Precautions: Fall;Other (comment) Precaution Comments: watch HR Restrictions Weight Bearing Restrictions: Yes LLE Weight Bearing: Weight bearing as tolerated     Mobility  Bed Mobility Overal bed mobility: Needs Assistance Bed Mobility: Supine to Sit     Supine to sit: Max assist     General bed mobility comments: Pt requiring step by step  cues for initiation, assisting with moving BLE's, hand over hand guidance for reaching for bed rail, pt pushing off with BUE's. Guarding away from L side    Transfers Overall transfer level: Needs assistance Equipment used: Ambulation equipment used Transfers: Sit to/from Stand Sit to Stand: Max assist, +2 physical assistance           General transfer comment: Christine Newton to progress from bed to chair. Multimodal cueing for initiation/execution, assist for foot placement, pt guarding heavily away from L side    Ambulation/Gait               General Gait Details: unable   Stairs             Wheelchair Mobility    Modified Rankin (Stroke Patients Only)       Balance Overall balance assessment: Needs assistance Sitting-balance support: Feet supported Sitting balance-[name]: Poor Sitting balance - Comments: right lateral lean, guarding away from left side. assist to reach midline                                    Cognition Arousal/Alertness: Awake/alert Behavior During Therapy: WFL for tasks assessed/performed Overall Cognitive Status: History of cognitive impairments - at baseline                                 General Comments: Confused, does not recall why she's in the hospital        Exercises General Exercises - Lower Extremity Ankle Circles/Pumps: Left, Supine, AAROM, 10 reps Heel Slides: AAROM, Left, 10 reps,  Supine Hip ABduction/ADduction: AAROM, Left, 10 reps, Supine    General Comments        Pertinent Vitals/Pain Pain Assessment Pain Assessment: PAINAD Breathing: normal Negative Vocalization: occasional moan/groan, low speech, negative/disapproving quality Facial Expression: facial grimacing Body Language: tense, distressed pacing, fidgeting Consolability: distracted or reassured by voice/touch PAINAD Score: 5 Pain Location: LLE Pain Descriptors / Indicators: Grimacing, Operative site  guarding Pain Intervention(s): Limited activity within patient's tolerance, Monitored during session, RN gave pain meds during session    Home Living                          Prior Function            PT Goals (current goals can now be found in the care plan section) Acute Rehab PT Goals Patient Stated Goal: did not state PT Goal Formulation: With patient Time For Goal Achievement: 10/17/22 Potential to Achieve Goals: Fair    Frequency    Min 3X/week      PT Plan Current plan remains appropriate    Co-evaluation              AM-PAC PT "6 Clicks" Mobility   Outcome Measure  Help needed turning from your back to your side while in a flat bed without using bedrails?: A Lot Help needed moving from lying on your back to sitting on the side of a flat bed without using bedrails?: A Lot Help needed moving to and from a bed to a chair (including a wheelchair)?: A Lot Help needed standing up from a chair using your arms (e.g., wheelchair or bedside chair)?: Total Help needed to walk in hospital room?: Total Help needed climbing 3-5 steps with a railing? : Total 6 Click Score: 9    End of Session Equipment Utilized During Treatment: Gait belt Activity Tolerance: Patient limited by pain Patient left: in chair;with call bell/phone within reach;with chair alarm set Nurse Communication: Mobility status;Other (comment) (pain meds) PT Visit Diagnosis: Other abnormalities of gait and mobility (R26.89);History of falling (Z91.81);Difficulty in walking, not elsewhere classified (R26.2);Pain Pain - Right/Left: Left Pain - part of body: Hip     Time: 1035-1100 PT Time Calculation (min) (ACUTE ONLY): 25 min  Charges:  $Therapeutic Activity: 23-37 mins                     Wyona Almas, PT, DPT Acute Rehabilitation Services Office [phone]    Deno Etienne 10/05/2022, 12:56 PM

## 2022-10-06 DIAGNOSIS — S72142A Displaced intertrochanteric fracture of left femur, initial encounter for closed fracture: Secondary | ICD-10-CM | POA: Diagnosis not present

## 2022-10-06 LAB — GLUCOSE, CAPILLARY
Glucose-Capillary: 102 mg/dL — ABNORMAL HIGH (ref 70–99)
Glucose-Capillary: 104 mg/dL — ABNORMAL HIGH (ref 70–99)
Glucose-Capillary: 104 mg/dL — ABNORMAL HIGH (ref 70–99)
Glucose-Capillary: 106 mg/dL — ABNORMAL HIGH (ref 70–99)
Glucose-Capillary: 153 mg/dL — ABNORMAL HIGH (ref 70–99)
Glucose-Capillary: 157 mg/dL — ABNORMAL HIGH (ref 70–99)

## 2022-10-06 LAB — CBC
HCT: 25.2 % — ABNORMAL LOW (ref 36.0–46.0)
Hemoglobin: 8.6 g/dL — ABNORMAL LOW (ref 12.0–15.0)
MCH: 29.6 pg (ref 26.0–34.0)
MCHC: 34.1 g/dL (ref 30.0–36.0)
MCV: 86.6 fL (ref 80.0–100.0)
Platelets: 174 10*3/uL (ref 150–400)
RBC: 2.91 MIL/uL — ABNORMAL LOW (ref 3.87–5.11)
RDW: 15.3 % (ref 11.5–15.5)
WBC: 18.7 10*3/uL — ABNORMAL HIGH (ref 4.0–10.5)
nRBC: 0.7 % — ABNORMAL HIGH (ref 0.0–0.2)

## 2022-10-06 LAB — COMPREHENSIVE METABOLIC PANEL
ALT: 12 U/L (ref 0–44)
AST: 19 U/L (ref 15–41)
Albumin: 2.3 g/dL — ABNORMAL LOW (ref 3.5–5.0)
Alkaline Phosphatase: 44 U/L (ref 38–126)
Anion gap: 9 (ref 5–15)
BUN: 22 mg/dL (ref 8–23)
CO2: 23 mmol/L (ref 22–32)
Calcium: 8.1 mg/dL — ABNORMAL LOW (ref 8.9–10.3)
Chloride: 111 mmol/L (ref 98–111)
Creatinine, Ser: 0.73 mg/dL (ref 0.44–1.00)
GFR, Estimated: >60 mL/min (ref >60)
Glucose, Bld: 85 mg/dL (ref 70–99)
Potassium: 3.8 mmol/L (ref 3.5–5.1)
Sodium: 143 mmol/L (ref 135–145)
Total Bilirubin: 1 mg/dL (ref 0.3–1.2)
Total Protein: 5.2 g/dL — ABNORMAL LOW (ref 6.5–8.1)

## 2022-10-06 LAB — MAGNESIUM: Magnesium: 2 mg/dL (ref 1.7–2.4)

## 2022-10-06 MED ORDER — IBUPROFEN 200 MG PO TABS
200.0000 mg | ORAL_TABLET | Freq: Four times a day (QID) | ORAL | Status: DC | PRN
  Administered 2022-10-07: 200 mg via ORAL
  Filled 2022-10-06: qty 1

## 2022-10-06 MED ORDER — PANTOPRAZOLE SODIUM 40 MG PO TBEC
40.0000 mg | DELAYED_RELEASE_TABLET | Freq: Every day | ORAL | Status: DC
  Administered 2022-10-06 – 2022-10-07 (×2): 40 mg via ORAL
  Filled 2022-10-06 (×2): qty 1

## 2022-10-06 MED ORDER — METOPROLOL TARTRATE 25 MG PO TABS
25.0000 mg | ORAL_TABLET | Freq: Two times a day (BID) | ORAL | Status: DC
  Administered 2022-10-06 – 2022-10-07 (×3): 25 mg via ORAL
  Filled 2022-10-06 (×3): qty 1

## 2022-10-06 MED ORDER — HYDROCODONE-ACETAMINOPHEN 5-325 MG PO TABS
1.0000 | ORAL_TABLET | Freq: Four times a day (QID) | ORAL | Status: DC | PRN
  Administered 2022-10-06: 1 via ORAL
  Filled 2022-10-06: qty 1

## 2022-10-06 MED ORDER — HYDROCODONE-ACETAMINOPHEN 5-325 MG PO TABS: 1.0000 | ORAL_TABLET | Freq: Four times a day (QID) | ORAL | Status: DC | PRN

## 2022-10-06 NOTE — Progress Notes (Signed)
Occupational Therapy Treatment Patient Details Name: Christine Newton MRN: [id number] DOB: Dec 02, 1933 Today's Date: 10/06/2022   History of present illness Pt is a 88 y.o. F who presents 10/01/2022 after a fall with displaced left intertrochanteric hip fracture now s/p cephalomedullary nail placement. Significant PMH: dementia, TIA, DM2, s/p AVR.   OT comments  Pt demonstrating progress with mobility and ability to participate in ADL tasks this session. Stedy used to mobilize OOB however pt abelt o stand form Stedy paddles with min A and maintain standing position while weight bearing through LLE x 30 seconds with improved upright posture. Supportive daughter present during session, which also improves Christine Newton's performance. HR 111 prior to stand and @ 125 after standing. Continue to recommend rehab at [facility]. Acute OT to follow.    Recommendations for follow up therapy are one component of a multi-disciplinary discharge planning process, led by the attending physician.  Recommendations may be updated based on patient status, additional functional criteria and insurance authorization.    Follow Up Recommendations  Skilled nursing-short term rehab (<3 hours/day)    Assistance Recommended at Discharge Frequent or constant Supervision/Assistance  Patient can return home with the following  Two people to help with walking and/or transfers;Two people to help with bathing/dressing/bathroom;Assistance with cooking/housework;Direct supervision/assist for medications management;Direct supervision/assist for financial management;Assist for transportation;Help with stairs or ramp for entrance   Equipment Recommendations  None recommended by OT    Recommendations for Other Services      Precautions / Restrictions Precautions Precautions: Fall;Other (comment) Precaution Comments: watch HR Restrictions Weight Bearing Restrictions: Yes LLE Weight Bearing: Weight bearing as tolerated        Mobility Bed Mobility Overal bed mobility: Needs Assistance Bed Mobility: Supine to Sit     Supine to sit: Max assist, +2 for physical assistance     General bed mobility comments: increased ability to participate in bed mobility this session    Transfers Overall transfer level: Needs assistance Equipment used: Ambulation equipment used Transfers: Sit to/from Stand Sit to Stand: Max assist, +2 physical assistance           General transfer comment: Christine Newton to progress from bed to chair. Pt initiating reaching for stedy bars; initialy push toward L however midline improved with cues   Balance Overall balance assessment: Needs assistance Sitting-balance support: Feet supported Sitting balance-Christine Newton: Fair Sitting balance - Comments: able to maintain midline position with min guard assist - initially pushing toward R to apparently off load L hip     Standing balance-Christine Newton: Poor                             ADL either performed or assessed with clinical judgement   ADL Overall ADL's : Needs assistance/impaired     Grooming: Set up;Supervision/safety                               Functional mobility during ADLs: +2 for physical assistance;Maximal assistance (use of stedy)      Extremity/Trunk Assessment Upper Extremity Assessment Upper Extremity Assessment: Generalized weakness   Lower Extremity Assessment LLE Deficits / Details: resistive to ankle ROM, prefering to keep foot plantar flexed however improves with repetitive movement; no redness on heel; encouraged nsg/family to keep heel elevated off bed using pillow. Will assess for need of Prafo        Vision  Perception     Praxis      Cognition Arousal/Alertness: Awake/alert Behavior During Therapy: WFL for tasks assessed/performed Overall Cognitive Status: History of cognitive impairments - at baseline                                  General Comments: follows commands with delay; more interactive than last session; most likely more impaired due to pain meds and unfamiliar environment        Exercises Exercises: General Lower Extremity General Exercises - Lower Extremity Ankle Circles/Pumps: Left, Supine, AAROM, 10 reps Heel Slides: AAROM, Left, 10 reps, Supine Hip ABduction/ADduction: AAROM, Left, 10 reps, Supine    Shoulder Instructions       General Comments      Pertinent Vitals/ Pain       Pain Assessment Pain Assessment: Faces Faces Pain Newton: Hurts even more Breathing: normal Negative Vocalization: occasional moan/groan, low speech, negative/disapproving quality Facial Expression: facial grimacing Body Language: tense, distressed pacing, fidgeting Consolability: distracted or reassured by voice/touch PAINAD Score: 5 Pain Location: LLE Pain Descriptors / Indicators: Grimacing, Operative site guarding Pain Intervention(s): Limited activity within patient's tolerance, RN gave pain meds during session  Home Living                                          Prior Functioning/Environment              Frequency  Min 2X/week        Progress Toward Goals  OT Goals(current goals can now be found in the care plan section)  Progress towards OT goals: Progressing toward goals  Acute Rehab OT Goals Patient Stated Goal: to get better OT Goal Formulation: With patient/family Time For Goal Achievement: 10/17/22 Potential to Achieve Goals: Good ADL Goals Pt Will Perform Lower Body Bathing: with mod assist;sit to/from stand Pt Will Transfer to Toilet: with mod assist;with +2 assist;bedside commode Additional ADL Goal #1: Bed mobility with mod A in preparation for ADL tasks  Plan Discharge plan remains appropriate    Co-evaluation                 AM-PAC OT "6 Clicks" Daily Activity     Outcome Measure   Help from another Newton eating meals?: A Little Help from  another Newton taking care of personal grooming?: A Little Help from another Newton toileting, which includes using toliet, bedpan, or urinal?: Total Help from another Newton bathing (including washing, rinsing, drying)?: A Lot Help from another Newton to put on and taking off regular upper body clothing?: A Lot Help from another Newton to put on and taking off regular lower body clothing?: Total 6 Click Score: 12    End of Session Equipment Utilized During Treatment: Gait belt  OT Visit Diagnosis: Unsteadiness on feet (R26.81);Other abnormalities of gait and mobility (R26.89);Muscle weakness (generalized) (M62.81);History of falling (Z91.81);Other symptoms and signs involving cognitive function;Pain Pain - Right/Left: Left Pain - part of body: Hip   Activity Tolerance Patient tolerated treatment well   Patient Left in chair;with call bell/phone within reach;with chair alarm set;with family/visitor present   Nurse Communication Mobility status;Need for lift equipment        Time: 1000-1031 OT Time Calculation (min): 31 min  Charges: OT General Charges $OT Visit: 1 Visit OT Treatments $Self Care/Home  Management : 23-37 mins  Maurie Boettcher, OT/L   Acute OT Clinical Specialist Acute Rehabilitation Services Pager [phone] Office [phone]   [facility] 10/06/2022, 10:38 AM

## 2022-10-06 NOTE — Progress Notes (Signed)
PROGRESS NOTE  Christine Newton ZDG:[id number] DOB: [birth year] DOA: 10/01/2022 PCP: Lillard Anes, MD   LOS: 5 days   Brief Narrative / Interim history: 88 year old with history of HTN, HLD, aortic stenosis status post aortic valve repair, TIA, dementia, DM 2 presented after a fall at home.  Poor historian due to dementia.  Denies any loss of consciousness.  X-ray of the left hip showed intertrochanteric left femur fracture.  CT head and cervical spine did not show any acute pathology.  Orthopedic consulted for surgery.  Underwent left hip cephalomedullary nail placement 10/8.  Postoperatively patient hemoglobin dropped to 6.5 requiring 1 unit PRBC transfusion.  Also had low urine output requiring IV fluids.  Subjective / 24h Interval events: She remains mildly confused, but pleasant.  No complaints in bed, denies pain.  Assesement and Plan: Principal Problem:   Closed left femoral fracture (HCC) Active Problems:   Essential hypertension   Hyperlipidemia   S/P AVR (aortic valve replacement)   History of TIA (transient ischemic attack)   Dementia (HCC)   Non-insulin dependent type 2 diabetes mellitus ([place])   Closed fracture of left hip ([place])  Principal problem Fall, Left intratrochanteric hip fracture status post left hip cephalomedullary nail placement 10/8 -she was admitted to the hospital and orthopedic surgery was consulted.  CT head and cervical spine negative for acute pathology.  Status post ORIF on 10/8, Pain management by orthopedic along with postop DVT prophylaxis and weightbearing precautions.  PT/OT recommending SNF -Dr. Reesa Chew spoke with outpatient cardiology Dr. Daneen Schick who states patient had wide-complex tachycardia on her monitor.  He will get the full results and get back to medical team if any further work-up is indicated   Active problems Acute blood loss anemia -Likely from surgical loss and ongoing acute illness.  She is status post 1 unit of red blood  cells.  Heme globin is remained stable this morning  Leukocytosis-Suspect reactive without any evidence of infection.  Procalcitonin negative.  UA negative.  White count improving  Essential hypertension, sinus tachycardia-pretty hypertensive yesterday in the setting of acute pain, heart rate elevated into the 140s.  Started on beta-blockers with improvement  Mild AKI -Baseline creatinine 0.8, creatinine up to 1.2, she was given fluids, now back to baseline at 0.7 this morning.  Stable   Hypertension-Replace lisinopril with Norvasc due to AKI.  Okay to place back on lisinopril upon discharge    Hyperlipidemia-Lipitor   History of TIA -Aspirin on hold.  Continue statin   Diabetes mellitus type II -Sliding scale and Accu-Cheks   Dementia-stable  Aortic stenosis status post aortic valve repair-Supportive care  Scheduled Meds:  amLODipine  5 mg Oral Daily   atorvastatin  40 mg Oral Daily   budesonide (PULMICORT) nebulizer solution  0.5 mg Nebulization BID   docusate sodium  100 mg Oral BID   enoxaparin (LOVENOX) injection  40 mg Subcutaneous Daily   insulin aspart  0-9 Units Subcutaneous Q4H   metoprolol tartrate  25 mg Oral BID   Continuous Infusions: PRN Meds:.guaiFENesin, hydrALAZINE, HYDROcodone-acetaminophen, HYDROmorphone (DILAUDID) injection, ipratropium-albuterol, metoprolol tartrate, ondansetron (ZOFRAN) IV, polyethylene glycol, senna-docusate, traZODone  Current Outpatient Medications  Medication Instructions   aspirin EC 81 mg, Oral, Daily, Swallow whole.   atorvastatin (LIPITOR) 40 mg, Oral, Daily   Carboxymethylcellulose Sodium (DRY EYE RELIEF OP) 1 drop, Both Eyes, Daily PRN   lisinopril (ZESTRIL) 20 mg, Oral, Daily   metFORMIN (GLUCOPHAGE) 500 mg, Oral, Daily with breakfast, Take 2 every morning and  1 every evening    Diet Orders (From admission, onward)     Start     Ordered   10/02/22 1328  Diet Carb Modified Fluid consistency: Thin; Room service appropriate?  Yes  Diet effective now       Question Answer Comment  Diet-HS Snack? Nothing   Calorie Level Medium 1600-2000   Fluid consistency: Thin   Room service appropriate? Yes      10/02/22 1327            DVT prophylaxis: enoxaparin (LOVENOX) injection 40 mg Start: 10/06/22 1000 SCDs Start: 10/01/22 2242   Lab Results  Component Value Date   PLT 174 10/06/2022      Code Status: DNR  Family Communication: Daughter at bedside  Status is: Inpatient  Remains inpatient appropriate because: Monitor hemoglobin, SNF within 24 hours if bed available  Level of care: Telemetry Surgical  Consultants:  Orthopedic surgery   Objective: Vitals:   10/05/22 2150 10/06/22 0356 10/06/22 0745 10/06/22 0847  BP: (!) 144/74 (!) 157/73  130/67  Pulse: (!) 109 (!) 104 (!) 104 (!) 106  Resp: '18 15 16 17  '$ Temp: 98.7 F (37.1 C) 98.8 F (37.1 C)  98.5 F (36.9 C)  TempSrc: Oral Oral  Oral  SpO2: 100% 97% 95% 100%  Weight:      Height:        Intake/Output Summary (Last 24 hours) at 10/06/2022 1151 Last data filed at 10/06/2022 0925 Gross per 24 hour  Intake 480 ml  Output 600 ml  Net -120 ml    Wt Readings from Last 3 Encounters:  10/02/22 64.4 kg  09/19/22 64.4 kg  09/02/22 68 kg    Examination:  Constitutional: NAD Eyes: lids and conjunctivae normal, no scleral icterus ENMT: mmm Neck: normal, supple Respiratory: clear to auscultation bilaterally, no wheezing, no crackles. Normal respiratory effort.  Cardiovascular: Regular rate and rhythm, no murmurs / rubs / gallops.  Abdomen: soft, no distention, no tenderness. Bowel sounds positive.  Skin: no rashes Neurologic: no focal deficits, equal strength   Data Reviewed: I have independently reviewed following labs and imaging studies   CBC Recent Labs  Lab 10/01/22 1755 10/02/22 0242 10/03/22 0030 10/04/22 0038 10/04/22 1619 10/05/22 0214 10/06/22 0226  WBC 23.2* 22.2* 19.1* 20.3*  --  21.1* 18.7*  HGB 10.7*  10.6* 7.7* 6.5* 8.7* 8.6* 8.6*  HCT 34.1* 32.9* 24.9* 19.8* 25.8* 26.4* 25.2*  PLT 259 240 185 154  --  169 174  MCV 87.4 87.5 89.6 86.8  --  89.8 86.6  MCH 27.4 28.2 27.7 28.5  --  29.3 29.6  MCHC 31.4 32.2 30.9 32.8  --  32.6 34.1  RDW 15.6* 15.5 15.5 15.5  --  14.9 15.3  LYMPHSABS 1.5  --   --   --   --   --   --   MONOABS 1.2*  --   --   --   --   --   --   EOSABS 0.0  --   --   --   --   --   --   BASOSABS 0.1  --   --   --   --   --   --      Recent Labs  Lab 10/01/22 1755 10/02/22 0242 10/02/22 1249 10/03/22 0030 10/04/22 0038 10/05/22 0214 10/06/22 0226  NA 143 141  --  141 142 142 143  K 3.5 3.6  --  4.5 3.4* 4.3  3.8  CL 114* 109  --  111 112* 115* 111  CO2 22 23  --  '22 22 22 23  '$ GLUCOSE 207* 159*  --  160* 151* 110* 85  BUN 33* 25*  --  33* 46* 28* 22  CREATININE 0.79 0.83  --  1.32* 1.24* 0.88 0.73  CALCIUM 8.7* 9.0  --  8.2* 7.7* 7.9* 8.1*  AST  --   --   --   --   --   --  19  ALT  --   --   --   --   --   --  12  ALKPHOS  --   --   --   --   --   --  44  BILITOT  --   --   --   --   --   --  1.0  ALBUMIN  --   --   --   --   --   --  2.3*  MG  --   --   --  1.9 2.1 1.9 2.0  PROCALCITON  --   --  <0.10  --   --   --   --   INR 1.0  --   --   --   --   --   --      ------------------------------------------------------------------------------------------------------------------ No results for input(s): "CHOL", "HDL", "LDLCALC", "TRIG", "CHOLHDL", "LDLDIRECT" in the last 72 hours.  Lab Results  Component Value Date   HGBA1C 7.0 (H) 07/12/2022   ------------------------------------------------------------------------------------------------------------------ No results for input(s): "TSH", "T4TOTAL", "T3FREE", "THYROIDAB" in the last 72 hours.  Invalid input(s): "FREET3"  Cardiac Enzymes No results for input(s): "CKMB", "TROPONINI", "MYOGLOBIN" in the last 168 hours.  Invalid input(s):  "CK" ------------------------------------------------------------------------------------------------------------------ No results found for: "BNP"  CBG: Recent Labs  Lab 10/05/22 1619 10/05/22 2012 10/06/22 0037 10/06/22 0354 10/06/22 0848  GLUCAP 148* 96 157* 106* 153*     No results found for this or any previous visit (from the past 240 hour(s)).   Radiology Studies: No results found.  Marzetta Board, MD, PhD [facility]  Between 7 am - 7 pm I am available, please contact me via Amion (for emergencies) or Securechat (non urgent messages)  Between 7 pm - 7 am I am not available, please contact night coverage MD/APP via Amion

## 2022-10-06 NOTE — Progress Notes (Signed)
   Subjective:  Sitting in chair following PT. Confused after dose of pain medication.   Objective:   VITALS:   Vitals:   10/05/22 2150 10/06/22 0356 10/06/22 0745 10/06/22 0847  BP: (!) 144/74 (!) 157/73  130/67  Pulse: (!) 109 (!) 104 (!) 104 (!) 106  Resp: '18 15 16 17  '$ Temp: 98.7 F (37.1 C) 98.8 F (37.1 C)  98.5 F (36.9 C)  TempSrc: Oral Oral  Oral  SpO2: 100% 97% 95% 100%  Weight:      Height:        Neurologically intact Dressing CDI Limb lengths equal   Lab Results  Component Value Date   WBC 18.7 (H) 10/06/2022   HGB 8.6 (L) 10/06/2022   HCT 25.2 (L) 10/06/2022   MCV 86.6 10/06/2022   PLT 174 10/06/2022     Assessment/Plan:  4 Days Post-Op status post left hip cephulomedullary nailing  - Expected postop acute blood loss anemia - currently stable - Patient to work with PT to optimize mobilization safely - DVT ppx - SCDs, ambulation, Lovenox - WBAT operative extremity - Pain control - multimodal pain management, ATC acetaminophen in conjunction with as needed narcotic (oxycodone), although this should be minimized with other modalities   Marlette Curvin 10/06/2022, 12:44 PM

## 2022-10-06 NOTE — TOC CAGE-AID Note (Signed)
Transition of Care [facility]) - CAGE-AID Screening   Patient Details  Name: Christine Newton MRN: [id number] Date of Birth: 01/18/1933  Transition of [facility]) CM/SW Contact:    Army Melia, RN Phone Number:[phone] 10/06/2022, 6:27 AM   Clinical Narrative:  Hx dementia, unable to participate in screening  CAGE-AID Screening: Substance Abuse Screening unable to be completed due to: : Patient unable to participate

## 2022-10-06 NOTE — Progress Notes (Signed)
Mobility Specialist - Progress Note   10/06/22 1502  Mobility  Activity Transferred from chair to bed  Level of Assistance Maximum assist, patient does 25-49% (+2)  Assistive Device Front wheel walker  Distance Ambulated (ft) 0 ft  LLE Weight Bearing WBAT  Activity Response Tolerated well  $Mobility charge 1 Mobility    Pt received in recliner requesting to transfer back to bed. Pivoted toward bed using RW and MaxA +2. Left in bed w/ RN and NT in room.   Paulla Dolly Mobility Specialist

## 2022-10-06 NOTE — NC FL2 (Signed)
[place] MEDICAID FL2 LEVEL OF CARE SCREENING TOOL     IDENTIFICATION  Patient Name: Christine Newton Birthdate: Nov 02, 1933 Sex: female Admission Date (Current Location): 10/01/2022  [facility] and [place] Number:  Herbalist and Address:  [facility], [place] [address], [place],  [address]      Provider Number: [id number]  Attending Physician Name and Address:  Caren Griffins, MD  Relative Name and Phone Number:  Mayo, Owczarzak (Daughter)   [phone]    Current Level of Care: Hospital Recommended Level of Care: [place] Prior Approval Number:    Date Approved/Denied:   PASRR Number: [id number]  Discharge Plan: SNF    Current Diagnoses: Patient Active Problem List   Diagnosis Date Noted   Closed fracture of left hip ([place]) 10/02/2022   Closed left femoral fracture ([place]) 10/01/2022   Syncope 08/31/2022   Non-insulin dependent type 2 diabetes mellitus ([place]) 08/31/2022   BMI 23.0-23.9, adult 07/20/2022   Seizure-like activity ([place]) 03/01/2022   Closed fracture of left inferior and superior pubic ramus ([place]) 01/19/2022   Dementia ([place]) 11/01/2021   Mass in region of sella turcica present on magnetic resonance imaging 12/21/2020   Thyroid cyst 12/14/2020   History of TIA (transient ischemic attack) 12/13/2020   Nonrheumatic aortic (valve) stenosis 09/21/2020   Obesity, diabetes, and hypertension syndrome ([place]) 09/17/2020   S/P AVR (aortic valve replacement) 07/13/2018   Hyperlipidemia 12/20/2013   Adenoma of pituitary ([place]) 11/04/2013   Essential hypertension 11/04/2013   History of colon cancer 11/04/2013    Orientation RESPIRATION BLADDER Height & Weight     Self, Place  Normal Incontinent Weight: 142 lb (64.4 kg) Height:  '5\' 6"'$  (167.6 cm)  BEHAVIORAL SYMPTOMS/MOOD NEUROLOGICAL BOWEL NUTRITION STATUS      Incontinent Diet (see d/c summary)  AMBULATORY STATUS COMMUNICATION OF NEEDS Skin   Total Care  Verbally Surgical wounds (closed Left hip incision)                       Personal Care Assistance Level of Assistance  Bathing, Feeding, Dressing Bathing Assistance: Maximum assistance Feeding assistance: Limited assistance Dressing Assistance: Maximum assistance     Functional Limitations Info  Sight, Hearing, Speech Sight Info: Adequate Hearing Info: Adequate Speech Info: Adequate    SPECIAL CARE FACTORS FREQUENCY  OT (By licensed OT), PT (By licensed PT)     PT Frequency: 5x/ week OT Frequency: 5x/ week            Contractures Contractures Info: Not present    Additional Factors Info  Insulin Sliding Scale, Allergies, Code Status Code Status Info: DNR Allergies Info: Cephalosporins   Insulin Sliding Scale Info: see d/c medication list       Current Medications (10/06/2022):  This is the current hospital active medication list Current Facility-Administered Medications  Medication Dose Route Frequency Provider Last Rate Last Admin   amLODipine (NORVASC) tablet 5 mg  5 mg Oral Daily Amin, Ankit Chirag, MD   5 mg at 10/06/22 1020   atorvastatin (LIPITOR) tablet 40 mg  40 mg Oral Daily Marcelyn Bruins, MD   40 mg at 10/06/22 1021   budesonide (PULMICORT) nebulizer solution 0.5 mg  0.5 mg Nebulization BID Amin, Ankit Chirag, MD   0.5 mg at 10/06/22 0745   docusate sodium (COLACE) capsule 100 mg  100 mg Oral BID Amin, Ankit Chirag, MD   100 mg at 10/06/22 1021   enoxaparin (LOVENOX)  injection 40 mg  40 mg Subcutaneous Daily Kaleen Mask, RPH   40 mg at 10/06/22 1021   guaiFENesin (ROBITUSSIN) 100 MG/5ML liquid 5 mL  5 mL Oral Q4H PRN Amin, Ankit Chirag, MD       hydrALAZINE (APRESOLINE) injection 10 mg  10 mg Intravenous Q4H PRN Amin, Ankit Chirag, MD       HYDROcodone-acetaminophen (NORCO/VICODIN) 5-325 MG per tablet 1 tablet  1 tablet Oral Q6H PRN Caren Griffins, MD       ibuprofen (ADVIL) tablet 200 mg  200 mg Oral Q6H PRN Caren Griffins, MD        insulin aspart (novoLOG) injection 0-9 Units  0-9 Units Subcutaneous Q4H Marcelyn Bruins, MD   2 Units at 10/06/22 0908   ipratropium-albuterol (DUONEB) 0.5-2.5 (3) MG/3ML nebulizer solution 3 mL  3 mL Nebulization Q4H PRN Amin, Ankit Chirag, MD       metoprolol tartrate (LOPRESSOR) injection 5 mg  5 mg Intravenous Q4H PRN Caren Griffins, MD       metoprolol tartrate (LOPRESSOR) tablet 25 mg  25 mg Oral BID Marzetta Board M, MD   25 mg at 10/06/22 1020   ondansetron (ZOFRAN) injection 4 mg  4 mg Intravenous Q6H PRN Amin, Ankit Chirag, MD   4 mg at 10/03/22 0943   pantoprazole (PROTONIX) EC tablet 40 mg  40 mg Oral Daily Gherghe, Costin M, MD       polyethylene glycol (MIRALAX / GLYCOLAX) packet 17 g  17 g Oral Daily PRN Marcelyn Bruins, MD       senna-docusate (Senokot-S) tablet 1 tablet  1 tablet Oral QHS PRN Amin, Ankit Chirag, MD       traZODone (DESYREL) tablet 50 mg  50 mg Oral QHS PRN Amin, Jeanella Flattery, MD         Discharge Medications: Please see discharge summary for a list of discharge medications.  Relevant Imaging Results:  Relevant Lab Results:   Additional Information SSN: [id number]  Paulene Floor Bayan Hedstrom, LCSWA

## 2022-10-06 NOTE — TOC Initial Note (Signed)
Transition of Care [facility]) - Initial/Assessment Note    Patient Details  Name: Christine Newton MRN: [id number] Date of Birth: 17-Dec-1933  Transition of Care [facility]) CM/SW Contact:    Tresa Endo Phone Number: 10/06/2022, 1:12 PM  Clinical Narrative:                 CSW spoke with pt daughter via phone about PT's recommendation for SNF. Pt daughter is agreeable but is hesitant because pt did not have a good experience at the last SNF she went to. Pt daughter is actively visiting facilities and will contact CSW this evening with a decision on SNF placement. CSW provided contact information and will continue to follow for any DP needs.  CSW start open auth with auth number [id number] and will follow up when SNF is chosen.         Patient Goals and CMS Choice        Expected Discharge Plan and Services                                                Prior Living Arrangements/Services                       Activities of Daily Living Home Assistive Devices/Equipment: Gilford Rile (specify type) ADL Screening (condition at time of admission) Patient's cognitive ability adequate to safely complete daily activities?: Yes Is the patient deaf or have difficulty hearing?: Yes Does the patient have difficulty seeing, even when wearing glasses/contacts?: No Does the patient have difficulty concentrating, remembering, or making decisions?: Yes Patient able to express need for assistance with ADLs?: Yes Does the patient have difficulty dressing or bathing?: Yes Independently performs ADLs?: Yes (appropriate for developmental age) Does the patient have difficulty walking or climbing stairs?: Yes Weakness of Legs: Both Weakness of Arms/Hands: None  Permission Sought/Granted                  Emotional Assessment              Admission diagnosis:  Closed left femoral fracture (HCC) [S72.92XA] Closed fracture of left hip, initial encounter ([place])  [S72.002A] Injury of head, initial encounter [S09.90XA] Fall, initial encounter [W19.XXXA] Patient Active Problem List   Diagnosis Date Noted   Closed fracture of left hip ([place]) 10/02/2022   Closed left femoral fracture ([place]) 10/01/2022   Syncope 08/31/2022   Non-insulin dependent type 2 diabetes mellitus ([place]) 08/31/2022   BMI 23.0-23.9, adult 07/20/2022   Seizure-like activity ([place]) 03/01/2022   Closed fracture of left inferior and superior pubic ramus ([place]) 01/19/2022   Dementia ([place]) 11/01/2021   Mass in region of sella turcica present on magnetic resonance imaging 12/21/2020   Thyroid cyst 12/14/2020   History of TIA (transient ischemic attack) 12/13/2020   Nonrheumatic aortic (valve) stenosis 09/21/2020   Obesity, diabetes, and hypertension syndrome ([place]) 09/17/2020   S/P AVR (aortic valve replacement) 07/13/2018   Hyperlipidemia 12/20/2013   Adenoma of pituitary ([place]) 11/04/2013   Essential hypertension 11/04/2013   History of colon cancer 11/04/2013   PCP:  Lillard Anes, MD Pharmacy:   [id number] [id number]: [phone]: [phone]    Social Determinants of Health (SDOH) Interventions    Readmission Risk Interventions  No data to display

## 2022-10-06 NOTE — Care Management Important Message (Signed)
Important Message  Patient Details  Name: Christine Newton MRN: [id number] Date of Birth: 10-04-1933   Medicare Important Message Given:  Yes     Hannah Beat 10/06/2022, 2:06 PM

## 2022-10-07 ENCOUNTER — Other Ambulatory Visit: Payer: Self-pay | Admitting: Family Medicine

## 2022-10-07 DIAGNOSIS — R410 Disorientation, unspecified: Secondary | ICD-10-CM | POA: Diagnosis not present

## 2022-10-07 DIAGNOSIS — D72829 Elevated white blood cell count, unspecified: Secondary | ICD-10-CM | POA: Diagnosis not present

## 2022-10-07 DIAGNOSIS — E785 Hyperlipidemia, unspecified: Secondary | ICD-10-CM | POA: Diagnosis not present

## 2022-10-07 DIAGNOSIS — Z0189 Encounter for other specified special examinations: Secondary | ICD-10-CM | POA: Diagnosis not present

## 2022-10-07 DIAGNOSIS — N179 Acute kidney failure, unspecified: Secondary | ICD-10-CM | POA: Diagnosis not present

## 2022-10-07 DIAGNOSIS — R2681 Unsteadiness on feet: Secondary | ICD-10-CM | POA: Diagnosis not present

## 2022-10-07 DIAGNOSIS — Z79899 Other long term (current) drug therapy: Secondary | ICD-10-CM | POA: Diagnosis not present

## 2022-10-07 DIAGNOSIS — I10 Essential (primary) hypertension: Secondary | ICD-10-CM | POA: Diagnosis not present

## 2022-10-07 DIAGNOSIS — M7989 Other specified soft tissue disorders: Secondary | ICD-10-CM | POA: Diagnosis not present

## 2022-10-07 DIAGNOSIS — Z953 Presence of xenogenic heart valve: Secondary | ICD-10-CM | POA: Diagnosis not present

## 2022-10-07 DIAGNOSIS — S7292XD Unspecified fracture of left femur, subsequent encounter for closed fracture with routine healing: Secondary | ICD-10-CM | POA: Diagnosis not present

## 2022-10-07 DIAGNOSIS — Z7401 Bed confinement status: Secondary | ICD-10-CM | POA: Diagnosis not present

## 2022-10-07 DIAGNOSIS — Z8673 Personal history of transient ischemic attack (TIA), and cerebral infarction without residual deficits: Secondary | ICD-10-CM | POA: Diagnosis not present

## 2022-10-07 DIAGNOSIS — E1159 Type 2 diabetes mellitus with other circulatory complications: Secondary | ICD-10-CM | POA: Diagnosis not present

## 2022-10-07 DIAGNOSIS — M6281 Muscle weakness (generalized): Secondary | ICD-10-CM | POA: Diagnosis not present

## 2022-10-07 DIAGNOSIS — K143 Hypertrophy of tongue papillae: Secondary | ICD-10-CM | POA: Diagnosis not present

## 2022-10-07 DIAGNOSIS — R531 Weakness: Secondary | ICD-10-CM | POA: Diagnosis not present

## 2022-10-07 DIAGNOSIS — R600 Localized edema: Secondary | ICD-10-CM | POA: Diagnosis not present

## 2022-10-07 DIAGNOSIS — R262 Difficulty in walking, not elsewhere classified: Secondary | ICD-10-CM | POA: Diagnosis not present

## 2022-10-07 DIAGNOSIS — R2689 Other abnormalities of gait and mobility: Secondary | ICD-10-CM | POA: Diagnosis not present

## 2022-10-07 DIAGNOSIS — S3282XA Multiple fractures of pelvis without disruption of pelvic ring, initial encounter for closed fracture: Secondary | ICD-10-CM | POA: Diagnosis not present

## 2022-10-07 DIAGNOSIS — I447 Left bundle-branch block, unspecified: Secondary | ICD-10-CM | POA: Diagnosis not present

## 2022-10-07 DIAGNOSIS — Z4789 Encounter for other orthopedic aftercare: Secondary | ICD-10-CM | POA: Diagnosis not present

## 2022-10-07 DIAGNOSIS — Z7984 Long term (current) use of oral hypoglycemic drugs: Secondary | ICD-10-CM | POA: Diagnosis not present

## 2022-10-07 DIAGNOSIS — E042 Nontoxic multinodular goiter: Secondary | ICD-10-CM | POA: Diagnosis not present

## 2022-10-07 DIAGNOSIS — I081 Rheumatic disorders of both mitral and tricuspid valves: Secondary | ICD-10-CM | POA: Diagnosis not present

## 2022-10-07 DIAGNOSIS — D649 Anemia, unspecified: Secondary | ICD-10-CM | POA: Diagnosis not present

## 2022-10-07 DIAGNOSIS — F0390 Unspecified dementia without behavioral disturbance: Secondary | ICD-10-CM | POA: Diagnosis not present

## 2022-10-07 DIAGNOSIS — R41841 Cognitive communication deficit: Secondary | ICD-10-CM | POA: Diagnosis not present

## 2022-10-07 DIAGNOSIS — Z9181 History of falling: Secondary | ICD-10-CM | POA: Diagnosis not present

## 2022-10-07 DIAGNOSIS — S72142D Displaced intertrochanteric fracture of left femur, subsequent encounter for closed fracture with routine healing: Secondary | ICD-10-CM | POA: Diagnosis not present

## 2022-10-07 DIAGNOSIS — R5381 Other malaise: Secondary | ICD-10-CM | POA: Diagnosis not present

## 2022-10-07 DIAGNOSIS — Z7982 Long term (current) use of aspirin: Secondary | ICD-10-CM | POA: Diagnosis not present

## 2022-10-07 DIAGNOSIS — L304 Erythema intertrigo: Secondary | ICD-10-CM | POA: Diagnosis not present

## 2022-10-07 DIAGNOSIS — I679 Cerebrovascular disease, unspecified: Secondary | ICD-10-CM | POA: Diagnosis not present

## 2022-10-07 DIAGNOSIS — S72142A Displaced intertrochanteric fracture of left femur, initial encounter for closed fracture: Secondary | ICD-10-CM | POA: Diagnosis not present

## 2022-10-07 DIAGNOSIS — I119 Hypertensive heart disease without heart failure: Secondary | ICD-10-CM | POA: Diagnosis not present

## 2022-10-07 DIAGNOSIS — R930 Abnormal findings on diagnostic imaging of skull and head, not elsewhere classified: Secondary | ICD-10-CM | POA: Diagnosis not present

## 2022-10-07 DIAGNOSIS — E119 Type 2 diabetes mellitus without complications: Secondary | ICD-10-CM | POA: Diagnosis not present

## 2022-10-07 DIAGNOSIS — S7292XA Unspecified fracture of left femur, initial encounter for closed fracture: Secondary | ICD-10-CM | POA: Diagnosis not present

## 2022-10-07 DIAGNOSIS — R197 Diarrhea, unspecified: Secondary | ICD-10-CM | POA: Diagnosis not present

## 2022-10-07 DIAGNOSIS — D62 Acute posthemorrhagic anemia: Secondary | ICD-10-CM | POA: Diagnosis not present

## 2022-10-07 LAB — GLUCOSE, CAPILLARY
Glucose-Capillary: 100 mg/dL — ABNORMAL HIGH (ref 70–99)
Glucose-Capillary: 175 mg/dL — ABNORMAL HIGH (ref 70–99)

## 2022-10-07 LAB — CBC
HCT: 24.8 % — ABNORMAL LOW (ref 36.0–46.0)
Hemoglobin: 8.1 g/dL — ABNORMAL LOW (ref 12.0–15.0)
MCH: 29 pg (ref 26.0–34.0)
MCHC: 32.7 g/dL (ref 30.0–36.0)
MCV: 88.9 fL (ref 80.0–100.0)
Platelets: 191 10*3/uL (ref 150–400)
RBC: 2.79 MIL/uL — ABNORMAL LOW (ref 3.87–5.11)
RDW: 15.8 % — ABNORMAL HIGH (ref 11.5–15.5)
WBC: 15.6 10*3/uL — ABNORMAL HIGH (ref 4.0–10.5)
nRBC: 0.2 % (ref 0.0–0.2)

## 2022-10-07 LAB — BASIC METABOLIC PANEL
Anion gap: 7 (ref 5–15)
BUN: 24 mg/dL — ABNORMAL HIGH (ref 8–23)
CO2: 25 mmol/L (ref 22–32)
Calcium: 7.8 mg/dL — ABNORMAL LOW (ref 8.9–10.3)
Chloride: 110 mmol/L (ref 98–111)
Creatinine, Ser: 0.76 mg/dL (ref 0.44–1.00)
GFR, Estimated: >60 mL/min (ref >60)
Glucose, Bld: 113 mg/dL — ABNORMAL HIGH (ref 70–99)
Potassium: 3.4 mmol/L — ABNORMAL LOW (ref 3.5–5.1)
Sodium: 142 mmol/L (ref 135–145)

## 2022-10-07 LAB — MAGNESIUM: Magnesium: 1.9 mg/dL (ref 1.7–2.4)

## 2022-10-07 MED ORDER — HYDROCODONE-ACETAMINOPHEN 5-325 MG PO TABS: 1.0000 | ORAL_TABLET | Freq: Four times a day (QID) | ORAL | 0 refills | Status: DC | PRN

## 2022-10-07 MED ORDER — POTASSIUM CHLORIDE CRYS ER 20 MEQ PO TBCR
40.0000 meq | EXTENDED_RELEASE_TABLET | Freq: Once | ORAL | Status: AC
  Administered 2022-10-07: 40 meq via ORAL
  Filled 2022-10-07: qty 2

## 2022-10-07 MED ORDER — ENOXAPARIN SODIUM 40 MG/0.4ML IJ SOSY: 40.0000 mg | PREFILLED_SYRINGE | Freq: Every day | INTRAMUSCULAR | Status: DC

## 2022-10-07 MED ORDER — METOPROLOL TARTRATE 25 MG PO TABS: 25.0000 mg | ORAL_TABLET | Freq: Two times a day (BID) | ORAL | Status: DC

## 2022-10-07 NOTE — Progress Notes (Signed)
Pt transported off unit via PTAR with daughter and all belongings at pt side. She remains awake and alert at baseline.

## 2022-10-07 NOTE — Progress Notes (Signed)
Report called to SNF RN at this time. PTAR arranged, awaiting pt transport. Daughter remains at bedside.

## 2022-10-07 NOTE — TOC Progression Note (Signed)
Transition of Care [facility]) - Progression Note    Patient Details  Name: Christine Newton MRN: [id number] Date of Birth: [birth year]  Transition of Care [facility]) CM/SW Contact  Reece Agar, [place] Phone Number: 10/07/2022, 11:10 AM  Clinical Narrative:    CSW spoke with pt daughter who chose [facility] [place] for SNF. CSW contacted [facility] who is able to provide a bed today. CSW will submit the facility to [facility].   Expected Discharge Plan: [place] Barriers to Discharge: No Barriers Identified  Expected Discharge Plan and Services Expected Discharge Plan: [place]     Post Acute Care Choice: [place]                                         Social Determinants of Health (SDOH) Interventions    Readmission Risk Interventions     No data to display

## 2022-10-07 NOTE — Progress Notes (Signed)
Physical Therapy Treatment Patient Details Name: Christine Newton MRN: [id number] DOB: 12/17/1933 Today's Date: 10/07/2022   History of Present Illness Pt is a 88 y.o. F who presents 10/01/2022 after a fall with displaced left intertrochanteric hip fracture now s/p cephalomedullary nail placement. Significant PMH: dementia, TIA, DM2, s/p AVR.    PT Comments    Pt received in bed. Session focused on bed mobility, exercises and sitting balance. Pt required max assist supine to sit and sit to supine. She demonstrated fair sitting balance EOB. Pt performed LE exercises supine and sitting. Pt supine in bed with HOB at 40 degrees at end of session.    Recommendations for follow up therapy are one component of a multi-disciplinary discharge planning process, led by the attending physician.  Recommendations may be updated based on patient status, additional functional criteria and insurance authorization.  Follow Up Recommendations  Skilled nursing-short term rehab (<3 hours/day) Can patient physically be transported by private vehicle: No   Assistance Recommended at Discharge Frequent or constant Supervision/Assistance  Patient can return home with the following A lot of help with walking and/or transfers;A lot of help with bathing/dressing/bathroom   Equipment Recommendations  None recommended by PT    Recommendations for Other Services       Precautions / Restrictions Precautions Precautions: Fall;Other (comment) Precaution Comments: watch HR Restrictions LLE Weight Bearing: Weight bearing as tolerated     Mobility  Bed Mobility Overal bed mobility: Needs Assistance Bed Mobility: Supine to Sit, Sit to Supine     Supine to sit: Max assist, HOB elevated Sit to supine: Max assist   General bed mobility comments: cues for sequencing, assist for all aspects of mobility.    Transfers                   General transfer comment: unable to progress OOB due to +2 assist  unavailable    Ambulation/Gait                   Stairs             Wheelchair Mobility    Modified Rankin (Stroke Patients Only)       Balance Overall balance assessment: Needs assistance Sitting-balance support: Feet supported, No upper extremity supported Sitting balance-[name]: Fair Sitting balance - Comments: min guard assist sitting EOB                                    Cognition Arousal/Alertness: Awake/alert Behavior During Therapy: WFL for tasks assessed/performed (tearful) Overall Cognitive Status: History of cognitive impairments - at baseline                                 General Comments: follows simple commands with delay. Answers "I don't know" to most questions        Exercises General Exercises - Lower Extremity Ankle Circles/Pumps: AROM, AAROM, Right, Left, 10 reps, Supine Long Arc Quad: AROM, Right, Left, 10 reps, Seated Hip ABduction/ADduction: AAROM, 10 reps, Left, Supine Hip Flexion/Marching: AROM, AAROM, Right, Left, 10 reps, Seated    General Comments General comments (skin integrity, edema, etc.): HR up to 110s during activity. SpO2 stable on RA      Pertinent Vitals/Pain Pain Assessment Pain Assessment: Faces Faces Pain Scale: Hurts even more Pain Location: LLE Pain Descriptors / Indicators: Grimacing,  Operative site guarding, Moaning Pain Intervention(s): Limited activity within patient's tolerance, Monitored during session, Repositioned    Home Living                          Prior Function            PT Goals (current goals can now be found in the care plan section) Acute Rehab PT Goals Patient Stated Goal: unable to state Progress towards PT goals: Progressing toward goals    Frequency    Min 3X/week      PT Plan Current plan remains appropriate    Co-evaluation              AM-PAC PT "6 Clicks" Mobility   Outcome Measure  Help needed turning  from your back to your side while in a flat bed without using bedrails?: A Lot Help needed moving from lying on your back to sitting on the side of a flat bed without using bedrails?: A Lot Help needed moving to and from a bed to a chair (including a wheelchair)?: Total Help needed standing up from a chair using your arms (e.g., wheelchair or bedside chair)?: Total Help needed to walk in hospital room?: Total Help needed climbing 3-5 steps with a railing? : Total 6 Click Score: 8    End of Session Equipment Utilized During Treatment: Gait belt Activity Tolerance: Patient limited by pain;Other (comment) (cognition) Patient left: in bed;with call bell/phone within reach;with bed alarm set Nurse Communication: Mobility status PT Visit Diagnosis: Other abnormalities of gait and mobility (R26.89);History of falling (Z91.81);Difficulty in walking, not elsewhere classified (R26.2);Pain Pain - Right/Left: Left Pain - part of body: Hip     Time: 6503-5465 PT Time Calculation (min) (ACUTE ONLY): 24 min  Charges:  $Therapeutic Exercise: 8-22 mins $Therapeutic Activity: 8-22 mins                     Lorrin Goodell, PT  Office # [phone] Pager [phone]    [name] 10/07/2022, 11:25 AM

## 2022-10-07 NOTE — Discharge Summary (Signed)
Physician Discharge Summary  Sarajane Shiver Yambao DUK:[id number] DOB: 04-Nov-1933 DOA: 10/01/2022  PCP: Lillard Anes, MD  Admit date: 10/01/2022 Discharge date: 10/07/2022  Admitted From: home Disposition:  SNF  Recommendations for Outpatient Follow-up:  Follow up with PCP in 1-2 weeks Please obtain BMP/CBC in one week  Home Health: none Equipment/Devices: none  Discharge Condition: stable CODE STATUS: DNR  HPI: Per admitting MD, Anayiah Shiver Milian is a 88 y.o. female with medical history significant of hypertension, hyperlipidemia, the aortic stenosis status post aortic valve repair, history of TIA, dementia, diabetes presenting after a fall at home. Due to patient's dementia, history obtained assistance of chart review and family.  Patient was reportedly getting up from the dining room chair to go to the sofa and missed the step as she tried to sit down and then fell and hit her head and bottom.  Denies any loss of consciousness no using any blood thinners. Patient reported pain at her right knee and head. She denies fevers, chills, chest pain, shortness of breath, Donnell pain, constipation, diarrhea, nausea, vomiting.  Hospital Course / Discharge diagnoses: Principal Problem:   Closed left femoral fracture (HCC) Active Problems:   Essential hypertension   Hyperlipidemia   S/P AVR (aortic valve replacement)   History of TIA (transient ischemic attack)   Dementia (HCC)   Non-insulin dependent type 2 diabetes mellitus ([place])   Closed fracture of left hip ([place])   Principal problem Fall, Left intratrochanteric hip fracture status post left hip cephalomedullary nail placement 10/8 -she was admitted to the hospital and orthopedic surgery was consulted.  CT head and cervical spine negative for acute pathology.  Status post ORIF on 10/8, Pain management by orthopedic along with postop DVT prophylaxis and weightbearing precautions.  PT/OT recommending SNF.    Active  problems Acute blood loss anemia -Likely from surgical loss and ongoing acute illness.  She is status post 1 unit of red blood cells.  Heme globin is remained stable  Leukocytosis-Suspect reactive without any evidence of infection.  Procalcitonin negative.  UA negative.  White count improving Essential hypertension, sinus tachycardia-pretty hypertensive in the setting of acute pain, heart rate elevated into the 140s.  Started on beta-blockers with improvement  Mild AKI -Baseline creatinine 0.8, creatinine up to 1.2, she was given fluids, now back to baseline at 0.7 this morning.  Stable. Hold ACEI on dc Hypertension-Replace lisinopril with Metoprolol due to AKI.  Hyperlipidemia-Lipitor History of TIA -Aspirin.  Continue statin Diabetes mellitus type II -Sliding scale and Accu-Cheks Dementia-stable Aortic stenosis status post aortic valve repair-Supportive care  Sepsis ruled out   Discharge Instructions   Allergies as of 10/07/2022       Reactions   Cephalosporins Nausea Only        Medication List     STOP taking these medications    lisinopril 20 MG tablet Commonly known as: ZESTRIL       TAKE these medications    aspirin EC 81 MG tablet Take 1 tablet (81 mg total) by mouth daily. Swallow whole. What changed: additional instructions   atorvastatin 40 MG tablet Commonly known as: LIPITOR Take 1 tablet (40 mg total) by mouth daily.   DRY EYE RELIEF OP Place 1 drop into both eyes daily as needed (dry eyes).   enoxaparin 40 MG/0.4ML injection Commonly known as: LOVENOX Inject 0.4 mLs (40 mg total) into the skin daily. Start taking on: October 08, 2022   HYDROcodone-acetaminophen 5-325 MG tablet Commonly known as:  NORCO/VICODIN Take 1 tablet by mouth every 6 (six) hours as needed for moderate pain or severe pain.   metFORMIN 500 MG tablet Commonly known as: GLUCOPHAGE TAKE 2 EVERY MORNING AND 1 EVERY EVENING What changed: See the new instructions.    metoprolol tartrate 25 MG tablet Commonly known as: LOPRESSOR Take 1 tablet (25 mg total) by mouth 2 (two) times daily.        Follow-up Information     Vanetta Mulders, MD Follow up.   Specialty: Orthopedic Surgery Contact information: [address] [place] [address] [phone]                 Consultations: Orthopedic surgery   Procedures/Studies:  DG FEMUR MIN 2 VIEWS LEFT  Result Date: 10/02/2022 CLINICAL DATA:  Operative imaging for left proximal femur fracture ORIF. EXAM: OPERATIVE LEFT HIP (WITH PELVIS IF PERFORMED) 4 VIEWS TECHNIQUE: Fluoroscopic spot image(s) were submitted for interpretation post-operatively. Fluoro time: 161 seconds.  Dose: 17.7 mGy. COMPARISON:  10/01/2022. FINDINGS: Submitted images show placement a long intramedullary rod supporting a compression screw, reducing the primary intertrochanteric fracture components into near anatomic alignment. The orthopedic hardware is well seated and positioned. IMPRESSION: Well aligned left proximal femur intertrochanteric fracture following ORIF. Electronically Signed   By: Lajean Manes M.D.   On: 10/02/2022 11:15   DG C-Arm 1-60 Min-No Report  Result Date: 10/02/2022 Fluoroscopy was utilized by the requesting physician.  No radiographic interpretation.   DG C-Arm 1-60 Min-No Report  Result Date: 10/02/2022 Fluoroscopy was utilized by the requesting physician.  No radiographic interpretation.   DG Ribs Unilateral W/Chest Left  Result Date: 10/01/2022 CLINICAL DATA:  Pt to ED via EMS from home c/o fall from dinning room chair. Pt was trying to sit down and missed the chair. Not on blood thinners . No LOC. Now c/o pain to right knee, LT hip/knee and ribs. EXAM: LEFT RIBS AND CHEST - 3+ VIEW COMPARISON:  Chest radiograph, 01/19/2022 FINDINGS: No convincing acute fracture. Old, healed fracture of the anterior left tenth rib. No bone lesion. Previous cardiac surgery and valve replacement.  There is opacity at the left lung base consistent with loculated pleural fluid. Lungs otherwise clear. No pneumothorax. IMPRESSION: 1. No acute fracture. 2. No acute cardiopulmonary disease. Electronically Signed   By: Lajean Manes M.D.   On: 10/01/2022 17:38   DG Knee Complete 4 Views Right  Result Date: 10/01/2022 CLINICAL DATA:  Pt to ED via EMS from home c/o fall from dinning room chair. Pt was trying to sit down and missed the chair. Not on blood thinners . No LOC. Now c/o pain to right knee, LT hip/knee and ribs. EXAM: RIGHT KNEE - COMPLETE 4+ VIEW COMPARISON:  None Available. FINDINGS: No fracture. No bone lesion. Skeletal structures are demineralized. Knee joint normally spaced and aligned.  No joint effusion. Posterior arterial vascular calcifications. IMPRESSION: No fracture or dislocation. Electronically Signed   By: Lajean Manes M.D.   On: 10/01/2022 17:37   DG Knee Complete 4 Views Left  Result Date: 10/01/2022 CLINICAL DATA:  Pt to ED via EMS from home c/o fall from dinning room chair. Pt was trying to sit down and missed the chair. Not on blood thinners . No LOC. Now c/o pain to right knee, LT hip/knee and ribs. EXAM: LEFT KNEE - COMPLETE 4+ VIEW COMPARISON:  None Available. FINDINGS: No fracture. No bone lesion. Skeletal structures are demineralized. Knee joint normally spaced and aligned. No significant arthropathic  changes. No joint effusion. Posterior arterial vascular calcifications. IMPRESSION: No fracture or dislocation. Electronically Signed   By: Lajean Manes M.D.   On: 10/01/2022 17:36   DG Hip Unilat W or Wo Pelvis 2-3 Views Left  Result Date: 10/01/2022 CLINICAL DATA:  Pt to ED via EMS from home c/o fall from dinning room chair. Pt was trying to sit down and missed the chair. Not on blood thinners . No LOC. Now c/o pain to right knee, LT hip/knee and ribs. EXAM: DG HIP (WITH OR WITHOUT PELVIS) 2-3V LEFT COMPARISON:  None Available. FINDINGS: Comminuted displaced  intertrochanteric fracture of the proximal left femur. Displacement relatively mild, proximally 1.5 cm. Separate fracture components of the lesser and greater trochanters. Minor varus angulation. No other fractures. There is diffuse sclerosis along the left femoral shaft of unclear etiology and chronicity. No lytic lesions. Remaining skeletal structures are diffusely demineralized. Hip joints, SI joints and symphysis pubis are normally aligned. Extensive arterial vascular calcifications. IMPRESSION: 1. Comminuted, mildly displaced and mildly varus angulated intertrochanteric fracture of the proximal left femur. Electronically Signed   By: Lajean Manes M.D.   On: 10/01/2022 17:35   CT Cervical Spine Wo Contrast  Result Date: 10/01/2022 CLINICAL DATA:  [place] from chair, dimension EXAM: CT CERVICAL SPINE WITHOUT CONTRAST TECHNIQUE: Multidetector CT imaging of the cervical spine was performed without intravenous contrast. Multiplanar CT image reconstructions were also generated. RADIATION DOSE REDUCTION: This exam was performed according to the departmental dose-optimization program which includes automated exposure control, adjustment of the mA and/or kV according to patient size and/or use of iterative reconstruction technique. COMPARISON:  01/19/2022 FINDINGS: Alignment: Alignment is grossly anatomic. Skull base and vertebrae: No acute fracture. No primary bone lesion or focal pathologic process. Soft tissues and spinal canal: No prevertebral fluid or swelling. No visible canal hematoma. Multinodular goiter again noted and unchanged. No specific imaging follow-up is required. Disc levels: There is diffuse multilevel spondylosis and facet hypertrophy unchanged since prior exam. Upper chest: Airway is patent.  Lung apices are clear. Other: Reconstructed images demonstrate no additional findings. IMPRESSION: 1. No acute cervical spine fracture. Electronically Signed   By: Randa Ngo M.D.   On: 10/01/2022 17:22    CT Head Wo Contrast  Result Date: 10/01/2022 CLINICAL DATA:  [place] from chair, dementia EXAM: CT HEAD WITHOUT CONTRAST TECHNIQUE: Contiguous axial images were obtained from the base of the skull through the vertex without intravenous contrast. RADIATION DOSE REDUCTION: This exam was performed according to the departmental dose-optimization program which includes automated exposure control, adjustment of the mA and/or kV according to patient size and/or use of iterative reconstruction technique. COMPARISON:  08/31/2022 FINDINGS: Brain: Stable chronic small-vessel ischemic changes throughout the periventricular white matter and basal ganglia. Prior left parietal cortical infarct. No acute infarct or hemorrhage. Lateral ventricles are unremarkable. Stable sellar mass previously documented as macro adenoma. No acute extra-axial fluid collections. No mass effect. Vascular: Stable atherosclerosis.  No hyperdense vessel. Skull: Stable erosive changes of the left cavernous sinus and sphenoid sinus related to the sellar mass described above. No acute bony abnormality. Sinuses/Orbits: Stable opacification of the sphenoid sinus. Remaining paranasal sinuses are clear. Other: None. IMPRESSION: 1. No acute intracranial process. 2. Stable chronic ischemic changes as above. 3. Stable sellar mass with erosive changes into the left cavernous sinus and sphenoid sinus, compatible macro adenoma based on previous imaging. Electronically Signed   By: Randa Ngo M.D.   On: 10/01/2022 17:20     Subjective: -  no chest pain, shortness of breath, no abdominal pain, nausea or vomiting.   Discharge Exam: BP 138/78 (BP Location: Left Arm)   Pulse 93   Temp 99 F (37.2 C) (Oral)   Resp 16   Ht '5\' 6"'$  (1.676 m)   Wt 64.4 kg   SpO2 98%   BMI 22.92 kg/m   General: Pt is alert, awake, not in acute distress Cardiovascular: RRR, S1/S2 +, no rubs, no gallops Respiratory: CTA bilaterally, no wheezing, no rhonchi Abdominal:  Soft, NT, ND, bowel sounds + Extremities: no edema, no cyanosis    The results of significant diagnostics from this hospitalization (including imaging, microbiology, ancillary and laboratory) are listed below for reference.     Microbiology: No results found for this or any previous visit (from the past 240 hour(s)).   Labs: Basic Metabolic Panel: Recent Labs  Lab 10/03/22 0030 10/04/22 0038 10/05/22 0214 10/06/22 0226 10/07/22 0400  NA 141 142 142 143 142  K 4.5 3.4* 4.3 3.8 3.4*  CL 111 112* 115* 111 110  CO2 '22 22 22 23 25  '$ GLUCOSE 160* 151* 110* 85 113*  BUN 33* 46* 28* 22 24*  CREATININE 1.32* 1.24* 0.88 0.73 0.76  CALCIUM 8.2* 7.7* 7.9* 8.1* 7.8*  MG 1.9 2.1 1.9 2.0 1.9   Liver Function Tests: Recent Labs  Lab 10/06/22 0226  AST 19  ALT 12  ALKPHOS 44  BILITOT 1.0  PROT 5.2*  ALBUMIN 2.3*   CBC: Recent Labs  Lab 10/01/22 1755 10/02/22 0242 10/03/22 0030 10/04/22 0038 10/04/22 1619 10/05/22 0214 10/06/22 0226 10/07/22 0400  WBC 23.2*   < > 19.1* 20.3*  --  21.1* 18.7* 15.6*  NEUTROABS 19.8*  --   --   --   --   --   --   --   HGB 10.7*   < > 7.7* 6.5* 8.7* 8.6* 8.6* 8.1*  HCT 34.1*   < > 24.9* 19.8* 25.8* 26.4* 25.2* 24.8*  MCV 87.4   < > 89.6 86.8  --  89.8 86.6 88.9  PLT 259   < > 185 154  --  169 174 191   < > = values in this interval not displayed.   CBG: Recent Labs  Lab 10/06/22 1157 10/06/22 1614 10/06/22 2044 10/07/22 0041 10/07/22 0834  GLUCAP 104* 102* 104* 175* 100*   Hgb A1c No results for input(s): "HGBA1C" in the last 72 hours. Lipid Profile No results for input(s): "CHOL", "HDL", "LDLCALC", "TRIG", "CHOLHDL", "LDLDIRECT" in the last 72 hours. Thyroid function studies No results for input(s): "TSH", "T4TOTAL", "T3FREE", "THYROIDAB" in the last 72 hours.  Invalid input(s): "FREET3" Urinalysis    Component Value Date/Time   COLORURINE YELLOW 10/03/2022 1632   APPEARANCEUR HAZY (A) 10/03/2022 1632   LABSPEC 1.025  10/03/2022 1632   PHURINE 5.0 10/03/2022 1632   GLUCOSEU NEGATIVE 10/03/2022 1632   HGBUR NEGATIVE 10/03/2022 [place] 10/03/2022 1632   BILIRUBINUR negative 03/01/2022 1206   BILIRUBINUR Negative 11/01/2021 1152   KETONESUR NEGATIVE 10/03/2022 1632   PROTEINUR 30 (A) 10/03/2022 1632   UROBILINOGEN 1.0 03/01/2022 1206   NITRITE NEGATIVE 10/03/2022 1632   LEUKOCYTESUR NEGATIVE 10/03/2022 1632    FURTHER DISCHARGE INSTRUCTIONS:   Get Medicines reviewed and adjusted: Please take all your medications with you for your next visit with your Primary MD   Laboratory/radiological data: Please request your Primary MD to go over all hospital tests and procedure/radiological results at the follow up, please  ask your Primary MD to get all Hospital records sent to his/her office.   In some cases, they will be blood work, cultures and biopsy results pending at the time of your discharge. Please request that your primary care M.D. goes through all the records of your hospital data and follows up on these results.   Also Note the following: If you experience worsening of your admission symptoms, develop shortness of breath, life threatening emergency, suicidal or homicidal thoughts you must seek medical attention immediately by calling 911 or calling your MD immediately  if symptoms less severe.   You must read complete instructions/literature along with all the possible adverse reactions/side effects for all the Medicines you take and that have been prescribed to you. Take any new Medicines after you have completely understood and accpet all the possible adverse reactions/side effects.    Do not drive when taking Pain medications or sleeping medications (Benzodaizepines)   Do not take more than prescribed Pain, Sleep and Anxiety Medications. It is not advisable to combine anxiety,sleep and pain medications without talking with your primary care practitioner   Special Instructions:  If you have smoked or chewed Tobacco  in the last 2 yrs please stop smoking, stop any regular Alcohol  and or any Recreational drug use.   Wear Seat belts while driving.   Please note: You were cared for by a hospitalist during your hospital stay. Once you are discharged, your primary care physician will handle any further medical issues. Please note that NO REFILLS for any discharge medications will be authorized once you are discharged, as it is imperative that you return to your primary care physician (or establish a relationship with a primary care physician if you do not have one) for your post hospital discharge needs so that they can reassess your need for medications and monitor your lab values.  Time coordinating discharge: 40 minutes  SIGNED:  Marzetta Board, MD, PhD 10/07/2022, 12:09 PM

## 2022-10-07 NOTE — TOC Transition Note (Signed)
Transition of Care [facility]) - CM/SW Discharge Note   Patient Details  Name: Christine Newton MRN: [id number] Date of Birth: [birth year]  [facility]) CM/SW Contact:  Tresa Endo Phone Number: 10/07/2022, 12:54 PM   Clinical Narrative:    Patient will DC to: [facility] date: 10/07/2022 Family notified: Pt daughter Transport by: Corey Harold   Per MD patient ready for DC to [facility] room 1201p. RN to call report prior to discharge [phone]. RN, patient, patient's family, and facility notified of DC. Discharge Summary and FL2 sent to facility. DC packet on chart. Ambulance transport requested for patient.   CSW will sign off for now as social work intervention is no longer needed. Please consult [name] again if new needs arise.     Final next level of care: Skilled Nursing Facility Barriers to Discharge: No Barriers Identified   Patient Goals and CMS Choice Patient states their goals for this hospitalization and ongoing recovery are:: Rehab CMS Medicare.gov Compare Post Acute Care list provided to:: Patient Choice offered to / list presented to : Patient, Adult Children  Discharge Placement                       Discharge Plan and Services     Post Acute Care Choice: Skilled Nursing Facility                               Social Determinants of Health (SDOH) Interventions     Readmission Risk Interventions     No data to display

## 2022-10-10 ENCOUNTER — Encounter (HOSPITAL_BASED_OUTPATIENT_CLINIC_OR_DEPARTMENT_OTHER): Payer: Self-pay

## 2022-10-11 DIAGNOSIS — I679 Cerebrovascular disease, unspecified: Secondary | ICD-10-CM | POA: Diagnosis not present

## 2022-10-11 DIAGNOSIS — I081 Rheumatic disorders of both mitral and tricuspid valves: Secondary | ICD-10-CM | POA: Diagnosis not present

## 2022-10-11 DIAGNOSIS — Z7982 Long term (current) use of aspirin: Secondary | ICD-10-CM | POA: Diagnosis not present

## 2022-10-11 DIAGNOSIS — I119 Hypertensive heart disease without heart failure: Secondary | ICD-10-CM | POA: Diagnosis not present

## 2022-10-11 DIAGNOSIS — E785 Hyperlipidemia, unspecified: Secondary | ICD-10-CM | POA: Diagnosis not present

## 2022-10-11 DIAGNOSIS — R930 Abnormal findings on diagnostic imaging of skull and head, not elsewhere classified: Secondary | ICD-10-CM | POA: Diagnosis not present

## 2022-10-11 DIAGNOSIS — N179 Acute kidney failure, unspecified: Secondary | ICD-10-CM | POA: Diagnosis not present

## 2022-10-11 DIAGNOSIS — E119 Type 2 diabetes mellitus without complications: Secondary | ICD-10-CM | POA: Diagnosis not present

## 2022-10-11 DIAGNOSIS — I447 Left bundle-branch block, unspecified: Secondary | ICD-10-CM | POA: Diagnosis not present

## 2022-10-11 DIAGNOSIS — F0390 Unspecified dementia without behavioral disturbance: Secondary | ICD-10-CM | POA: Diagnosis not present

## 2022-10-11 DIAGNOSIS — Z7984 Long term (current) use of oral hypoglycemic drugs: Secondary | ICD-10-CM | POA: Diagnosis not present

## 2022-10-11 DIAGNOSIS — Z953 Presence of xenogenic heart valve: Secondary | ICD-10-CM

## 2022-10-11 DIAGNOSIS — R262 Difficulty in walking, not elsewhere classified: Secondary | ICD-10-CM | POA: Diagnosis not present

## 2022-10-11 DIAGNOSIS — E1159 Type 2 diabetes mellitus with other circulatory complications: Secondary | ICD-10-CM | POA: Diagnosis not present

## 2022-10-11 DIAGNOSIS — Z9181 History of falling: Secondary | ICD-10-CM

## 2022-10-11 DIAGNOSIS — D62 Acute posthemorrhagic anemia: Secondary | ICD-10-CM | POA: Diagnosis not present

## 2022-10-11 DIAGNOSIS — S72142D Displaced intertrochanteric fracture of left femur, subsequent encounter for closed fracture with routine healing: Secondary | ICD-10-CM | POA: Diagnosis not present

## 2022-10-11 DIAGNOSIS — Z8673 Personal history of transient ischemic attack (TIA), and cerebral infarction without residual deficits: Secondary | ICD-10-CM | POA: Diagnosis not present

## 2022-10-11 DIAGNOSIS — E042 Nontoxic multinodular goiter: Secondary | ICD-10-CM | POA: Diagnosis not present

## 2022-10-12 DIAGNOSIS — E119 Type 2 diabetes mellitus without complications: Secondary | ICD-10-CM | POA: Diagnosis not present

## 2022-10-12 DIAGNOSIS — S7292XD Unspecified fracture of left femur, subsequent encounter for closed fracture with routine healing: Secondary | ICD-10-CM | POA: Diagnosis not present

## 2022-10-12 DIAGNOSIS — Z8673 Personal history of transient ischemic attack (TIA), and cerebral infarction without residual deficits: Secondary | ICD-10-CM | POA: Diagnosis not present

## 2022-10-12 DIAGNOSIS — S7292XA Unspecified fracture of left femur, initial encounter for closed fracture: Secondary | ICD-10-CM | POA: Diagnosis not present

## 2022-10-12 DIAGNOSIS — R2681 Unsteadiness on feet: Secondary | ICD-10-CM | POA: Diagnosis not present

## 2022-10-12 DIAGNOSIS — Z9181 History of falling: Secondary | ICD-10-CM | POA: Diagnosis not present

## 2022-10-12 DIAGNOSIS — R5381 Other malaise: Secondary | ICD-10-CM | POA: Diagnosis not present

## 2022-10-12 DIAGNOSIS — F0390 Unspecified dementia without behavioral disturbance: Secondary | ICD-10-CM | POA: Diagnosis not present

## 2022-10-12 DIAGNOSIS — M6281 Muscle weakness (generalized): Secondary | ICD-10-CM | POA: Diagnosis not present

## 2022-10-13 DIAGNOSIS — S7292XA Unspecified fracture of left femur, initial encounter for closed fracture: Secondary | ICD-10-CM | POA: Diagnosis not present

## 2022-10-13 DIAGNOSIS — F0390 Unspecified dementia without behavioral disturbance: Secondary | ICD-10-CM | POA: Diagnosis not present

## 2022-10-13 DIAGNOSIS — R5381 Other malaise: Secondary | ICD-10-CM | POA: Diagnosis not present

## 2022-10-13 DIAGNOSIS — Z9181 History of falling: Secondary | ICD-10-CM | POA: Diagnosis not present

## 2022-10-13 DIAGNOSIS — E119 Type 2 diabetes mellitus without complications: Secondary | ICD-10-CM | POA: Diagnosis not present

## 2022-10-13 DIAGNOSIS — R2681 Unsteadiness on feet: Secondary | ICD-10-CM | POA: Diagnosis not present

## 2022-10-13 DIAGNOSIS — M6281 Muscle weakness (generalized): Secondary | ICD-10-CM | POA: Diagnosis not present

## 2022-10-13 DIAGNOSIS — S7292XD Unspecified fracture of left femur, subsequent encounter for closed fracture with routine healing: Secondary | ICD-10-CM | POA: Diagnosis not present

## 2022-10-13 DIAGNOSIS — Z8673 Personal history of transient ischemic attack (TIA), and cerebral infarction without residual deficits: Secondary | ICD-10-CM | POA: Diagnosis not present

## 2022-10-17 DIAGNOSIS — E1159 Type 2 diabetes mellitus with other circulatory complications: Secondary | ICD-10-CM | POA: Diagnosis not present

## 2022-10-17 DIAGNOSIS — S72142D Displaced intertrochanteric fracture of left femur, subsequent encounter for closed fracture with routine healing: Secondary | ICD-10-CM | POA: Diagnosis not present

## 2022-10-17 DIAGNOSIS — D72829 Elevated white blood cell count, unspecified: Secondary | ICD-10-CM | POA: Diagnosis not present

## 2022-10-17 DIAGNOSIS — Z9181 History of falling: Secondary | ICD-10-CM | POA: Diagnosis not present

## 2022-10-17 DIAGNOSIS — S7292XD Unspecified fracture of left femur, subsequent encounter for closed fracture with routine healing: Secondary | ICD-10-CM | POA: Diagnosis not present

## 2022-10-17 DIAGNOSIS — E119 Type 2 diabetes mellitus without complications: Secondary | ICD-10-CM | POA: Diagnosis not present

## 2022-10-17 DIAGNOSIS — S7292XA Unspecified fracture of left femur, initial encounter for closed fracture: Secondary | ICD-10-CM | POA: Diagnosis not present

## 2022-10-17 DIAGNOSIS — M6281 Muscle weakness (generalized): Secondary | ICD-10-CM | POA: Diagnosis not present

## 2022-10-17 DIAGNOSIS — Z7984 Long term (current) use of oral hypoglycemic drugs: Secondary | ICD-10-CM | POA: Diagnosis not present

## 2022-10-17 DIAGNOSIS — Z8673 Personal history of transient ischemic attack (TIA), and cerebral infarction without residual deficits: Secondary | ICD-10-CM | POA: Diagnosis not present

## 2022-10-17 DIAGNOSIS — R5381 Other malaise: Secondary | ICD-10-CM | POA: Diagnosis not present

## 2022-10-17 DIAGNOSIS — R197 Diarrhea, unspecified: Secondary | ICD-10-CM | POA: Diagnosis not present

## 2022-10-17 DIAGNOSIS — R2681 Unsteadiness on feet: Secondary | ICD-10-CM | POA: Diagnosis not present

## 2022-10-17 DIAGNOSIS — F0390 Unspecified dementia without behavioral disturbance: Secondary | ICD-10-CM | POA: Diagnosis not present

## 2022-10-18 DIAGNOSIS — D649 Anemia, unspecified: Secondary | ICD-10-CM | POA: Diagnosis not present

## 2022-10-18 DIAGNOSIS — L304 Erythema intertrigo: Secondary | ICD-10-CM | POA: Diagnosis not present

## 2022-10-18 DIAGNOSIS — Z7984 Long term (current) use of oral hypoglycemic drugs: Secondary | ICD-10-CM | POA: Diagnosis not present

## 2022-10-18 DIAGNOSIS — R600 Localized edema: Secondary | ICD-10-CM | POA: Diagnosis not present

## 2022-10-18 DIAGNOSIS — E1159 Type 2 diabetes mellitus with other circulatory complications: Secondary | ICD-10-CM | POA: Diagnosis not present

## 2022-10-18 DIAGNOSIS — R197 Diarrhea, unspecified: Secondary | ICD-10-CM | POA: Diagnosis not present

## 2022-10-18 DIAGNOSIS — Z0189 Encounter for other specified special examinations: Secondary | ICD-10-CM | POA: Diagnosis not present

## 2022-10-19 ENCOUNTER — Ambulatory Visit (HOSPITAL_BASED_OUTPATIENT_CLINIC_OR_DEPARTMENT_OTHER): Payer: Medicare PPO | Admitting: Orthopaedic Surgery

## 2022-10-19 DIAGNOSIS — R2681 Unsteadiness on feet: Secondary | ICD-10-CM | POA: Diagnosis not present

## 2022-10-19 DIAGNOSIS — S7292XD Unspecified fracture of left femur, subsequent encounter for closed fracture with routine healing: Secondary | ICD-10-CM | POA: Diagnosis not present

## 2022-10-19 DIAGNOSIS — F0390 Unspecified dementia without behavioral disturbance: Secondary | ICD-10-CM | POA: Diagnosis not present

## 2022-10-19 DIAGNOSIS — M6281 Muscle weakness (generalized): Secondary | ICD-10-CM | POA: Diagnosis not present

## 2022-10-19 DIAGNOSIS — Z8673 Personal history of transient ischemic attack (TIA), and cerebral infarction without residual deficits: Secondary | ICD-10-CM | POA: Diagnosis not present

## 2022-10-19 DIAGNOSIS — S7292XA Unspecified fracture of left femur, initial encounter for closed fracture: Secondary | ICD-10-CM | POA: Diagnosis not present

## 2022-10-19 DIAGNOSIS — Z9181 History of falling: Secondary | ICD-10-CM | POA: Diagnosis not present

## 2022-10-19 DIAGNOSIS — E119 Type 2 diabetes mellitus without complications: Secondary | ICD-10-CM | POA: Diagnosis not present

## 2022-10-19 DIAGNOSIS — R5381 Other malaise: Secondary | ICD-10-CM | POA: Diagnosis not present

## 2022-10-21 ENCOUNTER — Ambulatory Visit (INDEPENDENT_AMBULATORY_CARE_PROVIDER_SITE_OTHER): Payer: Medicare PPO

## 2022-10-21 ENCOUNTER — Ambulatory Visit (INDEPENDENT_AMBULATORY_CARE_PROVIDER_SITE_OTHER): Payer: Medicare PPO | Admitting: Orthopaedic Surgery

## 2022-10-21 DIAGNOSIS — S3282XA Multiple fractures of pelvis without disruption of pelvic ring, initial encounter for closed fracture: Secondary | ICD-10-CM

## 2022-10-21 DIAGNOSIS — Z4789 Encounter for other orthopedic aftercare: Secondary | ICD-10-CM | POA: Diagnosis not present

## 2022-10-21 NOTE — Progress Notes (Signed)
Chief Complaint: Left cephalomedullary nailing left hip 10/8     History of Present Illness:    Christine Newton is a 88 y.o. female presents today 2 weeks status post left hip cephalomedullary nailing for hip fracture overall doing well.  She has been at rehab.  Reportedly she has been able to take several steps with a walker although this has been quite limited and her daughter has not seen this.  Hip pain continues to improve.  She is able to do basic bed mobility with this.  Is here today for further assessment    Surgical History:   None  PMH/PSH/Family History/Social History/Meds/Allergies:    Past Medical History:  Diagnosis Date   Diabetes ([place])    Hypertension    Nonrheumatic aortic (valve) stenosis 09/21/2020   Past Surgical History:  Procedure Laterality Date   CARDIAC VALVE REPLACEMENT     CHOLECYSTECTOMY     INTRAMEDULLARY (IM) NAIL INTERTROCHANTERIC Left 10/02/2022   Procedure: INTRAMEDULLARY (IM) NAIL INTERTROCHANTERIC;  Surgeon: Vanetta Mulders, MD;  Location: [place];  Service: Orthopedics;  Laterality: Left;   Social History   Socioeconomic History   Marital status: Widowed    Spouse name: Not on file   Number of children: 2   Years of education: 12 + 4   Highest education level: Not on file  Occupational History   Occupation: RETIRED SOCIAL WORKER  Tobacco Use   Smoking status: Never   Smokeless tobacco: Never  Vaping Use   Vaping Use: Never used  Substance and Sexual Activity   Alcohol use: Never   Drug use: Never   Sexual activity: Not Currently  Other Topics Concern   Not on file  Social History Narrative   Not on file   Social Determinants of Health   Financial Resource Strain: Not on file  Food Insecurity: Not on file  Transportation Needs: Not on file  Physical Activity: Not on file  Stress: Not on file  Social Connections: Not on file   No family history on file. Allergies  Allergen Reactions    Cephalosporins Nausea Only   Current Outpatient Medications  Medication Sig Dispense Refill   aspirin EC 81 MG tablet Take 1 tablet (81 mg total) by mouth daily. Swallow whole. (Patient taking differently: Take 81 mg by mouth daily.) 30 tablet 11   atorvastatin (LIPITOR) 40 MG tablet Take 1 tablet (40 mg total) by mouth daily. 90 tablet 1   Carboxymethylcellulose Sodium (DRY EYE RELIEF OP) Place 1 drop into both eyes daily as needed (dry eyes).     enoxaparin (LOVENOX) 40 MG/0.4ML injection Inject 0.4 mLs (40 mg total) into the skin daily. 0 mL    HYDROcodone-acetaminophen (NORCO/VICODIN) 5-325 MG tablet Take 1 tablet by mouth every 6 (six) hours as needed for moderate pain or severe pain. 10 tablet 0   metFORMIN (GLUCOPHAGE) 500 MG tablet TAKE 2 EVERY MORNING AND 1 EVERY EVENING 270 tablet 0   metoprolol tartrate (LOPRESSOR) 25 MG tablet Take 1 tablet (25 mg total) by mouth 2 (two) times daily.     No current facility-administered medications for this visit.   No results found.  Review of Systems:   A ROS was performed including pertinent positives and negatives as documented in the HPI.  Physical Exam :   Constitutional: NAD  and appears stated age Neurological: Alert and oriented Psych: Appropriate affect and cooperative There were no vitals taken for this visit.   Comprehensive Musculoskeletal Exam:    Left hip incision is well-appearing without erythema or drainage.  Staples removed today.  She is able to weakly extend at the left knee and fires all muscle groups distally neurovascular intact.  Limb lengths are equal with good rotational alignment  Imaging:   Xray (2 views left femur): Status post left hip cephalomedullary nail without evidence of complication    I personally reviewed and interpreted the radiographs.   Assessment:   88 y.o. female 2 weeks status post left hip cephalomedullary nail without evidence of complication.  Overall this time I would like her to  continue to weight-bear as tolerated and continue to progress her mobility and activity.  I will plan to see her back in 4 weeks for reassessment  Plan :    -Return to clinic 4 weeks for reassessment     I personally saw and evaluated the patient, and participated in the management and treatment plan.  Vanetta Mulders, MD Attending Physician, Orthopedic Surgery  This document was dictated using Dragon voice recognition software. A reasonable attempt at proof reading has been made to minimize errors.

## 2022-10-24 DIAGNOSIS — S7292XD Unspecified fracture of left femur, subsequent encounter for closed fracture with routine healing: Secondary | ICD-10-CM | POA: Diagnosis not present

## 2022-10-24 DIAGNOSIS — S7292XA Unspecified fracture of left femur, initial encounter for closed fracture: Secondary | ICD-10-CM | POA: Diagnosis not present

## 2022-10-24 DIAGNOSIS — Z9181 History of falling: Secondary | ICD-10-CM | POA: Diagnosis not present

## 2022-10-24 DIAGNOSIS — R5381 Other malaise: Secondary | ICD-10-CM | POA: Diagnosis not present

## 2022-10-24 DIAGNOSIS — Z8673 Personal history of transient ischemic attack (TIA), and cerebral infarction without residual deficits: Secondary | ICD-10-CM | POA: Diagnosis not present

## 2022-10-24 DIAGNOSIS — K143 Hypertrophy of tongue papillae: Secondary | ICD-10-CM | POA: Diagnosis not present

## 2022-10-24 DIAGNOSIS — L304 Erythema intertrigo: Secondary | ICD-10-CM | POA: Diagnosis not present

## 2022-10-24 DIAGNOSIS — R2681 Unsteadiness on feet: Secondary | ICD-10-CM | POA: Diagnosis not present

## 2022-10-24 DIAGNOSIS — M6281 Muscle weakness (generalized): Secondary | ICD-10-CM | POA: Diagnosis not present

## 2022-10-24 DIAGNOSIS — S72142D Displaced intertrochanteric fracture of left femur, subsequent encounter for closed fracture with routine healing: Secondary | ICD-10-CM | POA: Diagnosis not present

## 2022-10-24 DIAGNOSIS — E119 Type 2 diabetes mellitus without complications: Secondary | ICD-10-CM | POA: Diagnosis not present

## 2022-10-24 DIAGNOSIS — F0390 Unspecified dementia without behavioral disturbance: Secondary | ICD-10-CM | POA: Diagnosis not present

## 2022-10-24 DIAGNOSIS — Z7984 Long term (current) use of oral hypoglycemic drugs: Secondary | ICD-10-CM | POA: Diagnosis not present

## 2022-10-25 ENCOUNTER — Telehealth: Payer: Self-pay | Admitting: Cardiology

## 2022-10-25 NOTE — Telephone Encounter (Signed)
Please notify pt her monitor shows some rapid HR.  She does need follow up appt in next 3-4 weeks with Dr. Tamala Julian or APP. Cecilie Kicks, FNP-C  Left message for patient's daughter (DPR) to call back.

## 2022-10-25 NOTE — Telephone Encounter (Signed)
Daughter is returning LPN's call regarding heart monitor results. 3-4 week f/u was scheduled for 11/22 with NP Sharyn Lull. Daughter is a Pharmacist, hospital so she would not be able to answer a callback regarding results until after 3:30. Please advise.

## 2022-10-26 DIAGNOSIS — Z8673 Personal history of transient ischemic attack (TIA), and cerebral infarction without residual deficits: Secondary | ICD-10-CM | POA: Diagnosis not present

## 2022-10-26 DIAGNOSIS — S7292XD Unspecified fracture of left femur, subsequent encounter for closed fracture with routine healing: Secondary | ICD-10-CM | POA: Diagnosis not present

## 2022-10-26 DIAGNOSIS — F0390 Unspecified dementia without behavioral disturbance: Secondary | ICD-10-CM | POA: Diagnosis not present

## 2022-10-26 DIAGNOSIS — E119 Type 2 diabetes mellitus without complications: Secondary | ICD-10-CM | POA: Diagnosis not present

## 2022-10-26 DIAGNOSIS — R5381 Other malaise: Secondary | ICD-10-CM | POA: Diagnosis not present

## 2022-10-26 DIAGNOSIS — Z9181 History of falling: Secondary | ICD-10-CM | POA: Diagnosis not present

## 2022-10-26 DIAGNOSIS — S7292XA Unspecified fracture of left femur, initial encounter for closed fracture: Secondary | ICD-10-CM | POA: Diagnosis not present

## 2022-10-26 DIAGNOSIS — M6281 Muscle weakness (generalized): Secondary | ICD-10-CM | POA: Diagnosis not present

## 2022-10-27 DIAGNOSIS — D62 Acute posthemorrhagic anemia: Secondary | ICD-10-CM | POA: Diagnosis not present

## 2022-10-27 DIAGNOSIS — D649 Anemia, unspecified: Secondary | ICD-10-CM | POA: Diagnosis not present

## 2022-10-31 NOTE — Telephone Encounter (Signed)
Lm to call back ./cy 

## 2022-11-02 ENCOUNTER — Ambulatory Visit: Payer: Medicare PPO | Admitting: Legal Medicine

## 2022-11-02 NOTE — Telephone Encounter (Signed)
Called and spoke with daughter on DPR who states that they do intend to keep 11/22 appt.

## 2022-11-03 DIAGNOSIS — Z9181 History of falling: Secondary | ICD-10-CM | POA: Diagnosis not present

## 2022-11-03 DIAGNOSIS — S7292XA Unspecified fracture of left femur, initial encounter for closed fracture: Secondary | ICD-10-CM | POA: Diagnosis not present

## 2022-11-03 DIAGNOSIS — Z8673 Personal history of transient ischemic attack (TIA), and cerebral infarction without residual deficits: Secondary | ICD-10-CM | POA: Diagnosis not present

## 2022-11-03 DIAGNOSIS — R5381 Other malaise: Secondary | ICD-10-CM | POA: Diagnosis not present

## 2022-11-03 DIAGNOSIS — S7292XD Unspecified fracture of left femur, subsequent encounter for closed fracture with routine healing: Secondary | ICD-10-CM | POA: Diagnosis not present

## 2022-11-03 DIAGNOSIS — F0390 Unspecified dementia without behavioral disturbance: Secondary | ICD-10-CM | POA: Diagnosis not present

## 2022-11-03 DIAGNOSIS — E119 Type 2 diabetes mellitus without complications: Secondary | ICD-10-CM | POA: Diagnosis not present

## 2022-11-03 DIAGNOSIS — M6281 Muscle weakness (generalized): Secondary | ICD-10-CM | POA: Diagnosis not present

## 2022-11-07 ENCOUNTER — Other Ambulatory Visit: Payer: Self-pay

## 2022-11-07 ENCOUNTER — Telehealth: Payer: Self-pay

## 2022-11-07 ENCOUNTER — Emergency Department (HOSPITAL_COMMUNITY): Payer: Medicare PPO

## 2022-11-07 ENCOUNTER — Emergency Department (HOSPITAL_COMMUNITY)
Admission: EM | Admit: 2022-11-07 | Discharge: 2022-11-08 | Disposition: A | Discharge status: Home / Self Care | Payer: Medicare PPO | Attending: Emergency Medicine | Admitting: Emergency Medicine

## 2022-11-07 DIAGNOSIS — S7001XA Contusion of right hip, initial encounter: Secondary | ICD-10-CM | POA: Diagnosis not present

## 2022-11-07 DIAGNOSIS — I10 Essential (primary) hypertension: Secondary | ICD-10-CM | POA: Insufficient documentation

## 2022-11-07 DIAGNOSIS — E119 Type 2 diabetes mellitus without complications: Secondary | ICD-10-CM | POA: Insufficient documentation

## 2022-11-07 DIAGNOSIS — Z7982 Long term (current) use of aspirin: Secondary | ICD-10-CM | POA: Diagnosis not present

## 2022-11-07 DIAGNOSIS — M7989 Other specified soft tissue disorders: Secondary | ICD-10-CM | POA: Diagnosis not present

## 2022-11-07 DIAGNOSIS — Z79899 Other long term (current) drug therapy: Secondary | ICD-10-CM | POA: Diagnosis not present

## 2022-11-07 DIAGNOSIS — I959 Hypotension, unspecified: Secondary | ICD-10-CM | POA: Diagnosis not present

## 2022-11-07 DIAGNOSIS — T68XXXA Hypothermia, initial encounter: Secondary | ICD-10-CM | POA: Diagnosis not present

## 2022-11-07 DIAGNOSIS — D72829 Elevated white blood cell count, unspecified: Secondary | ICD-10-CM | POA: Diagnosis not present

## 2022-11-07 DIAGNOSIS — M25551 Pain in right hip: Secondary | ICD-10-CM | POA: Diagnosis not present

## 2022-11-07 DIAGNOSIS — Z7984 Long term (current) use of oral hypoglycemic drugs: Secondary | ICD-10-CM | POA: Diagnosis not present

## 2022-11-07 DIAGNOSIS — R609 Edema, unspecified: Secondary | ICD-10-CM | POA: Diagnosis not present

## 2022-11-07 DIAGNOSIS — F0390 Unspecified dementia without behavioral disturbance: Secondary | ICD-10-CM | POA: Insufficient documentation

## 2022-11-07 DIAGNOSIS — X58XXXA Exposure to other specified factors, initial encounter: Secondary | ICD-10-CM | POA: Diagnosis not present

## 2022-11-07 LAB — BASIC METABOLIC PANEL
Anion gap: 9 (ref 5–15)
BUN: 24 mg/dL — ABNORMAL HIGH (ref 8–23)
CO2: 26 mmol/L (ref 22–32)
Calcium: 8 mg/dL — ABNORMAL LOW (ref 8.9–10.3)
Chloride: 110 mmol/L (ref 98–111)
Creatinine, Ser: 0.93 mg/dL (ref 0.44–1.00)
GFR, Estimated: 59 mL/min — ABNORMAL LOW (ref >60)
Glucose, Bld: 176 mg/dL — ABNORMAL HIGH (ref 70–99)
Potassium: 3.5 mmol/L (ref 3.5–5.1)
Sodium: 145 mmol/L (ref 135–145)

## 2022-11-07 LAB — CBG MONITORING, ED: Glucose-Capillary: 169 mg/dL — ABNORMAL HIGH (ref 70–99)

## 2022-11-07 LAB — CBC WITH DIFFERENTIAL/PLATELET
Abs Immature Granulocytes: 0.37 10*3/uL — ABNORMAL HIGH (ref 0.00–0.07)
Basophils Absolute: 0.1 10*3/uL (ref 0.0–0.1)
Basophils Relative: 1 %
Eosinophils Absolute: 0.2 10*3/uL (ref 0.0–0.5)
Eosinophils Relative: 1 %
HCT: 29 % — ABNORMAL LOW (ref 36.0–46.0)
Hemoglobin: 9 g/dL — ABNORMAL LOW (ref 12.0–15.0)
Immature Granulocytes: 3 %
Lymphocytes Relative: 17 %
Lymphs Abs: 2.1 10*3/uL (ref 0.7–4.0)
MCH: 29.6 pg (ref 26.0–34.0)
MCHC: 31 g/dL (ref 30.0–36.0)
MCV: 95.4 fL (ref 80.0–100.0)
Monocytes Absolute: 0.9 10*3/uL (ref 0.1–1.0)
Monocytes Relative: 8 %
Neutro Abs: 8.6 10*3/uL — ABNORMAL HIGH (ref 1.7–7.7)
Neutrophils Relative %: 70 %
Platelets: 315 10*3/uL (ref 150–400)
RBC: 3.04 MIL/uL — ABNORMAL LOW (ref 3.87–5.11)
RDW: 18.5 % — ABNORMAL HIGH (ref 11.5–15.5)
WBC: 12.2 10*3/uL — ABNORMAL HIGH (ref 4.0–10.5)
nRBC: 0.2 % (ref 0.0–0.2)

## 2022-11-07 NOTE — Telephone Encounter (Signed)
Christine Newton called stated that Christine Newton fell at the nursing home and is concerned she broke a hip. I spoke with Hoyle Sauer, RN who stated that if she is worried that Christine Newton might have broken something or hip her head, then she needs to take her on the emergency department. Christine Newton was notified, she stated that she is going to try an take her to [facility].

## 2022-11-07 NOTE — ED Triage Notes (Signed)
Pt with hx dementia from home for eval. R hip pain, noticed by daughter at [facility], and when the facility was unable to provide any info about the injury and/or whether she fell, she brought her home. Pt recently had surgery to L hip, for which she was doing rehab at [facility].

## 2022-11-07 NOTE — ED Notes (Signed)
Patient family member states patient is clammy and needs vitals checked. This Probation officer checked blood pressure. Patient family member states "under 102 systolically she passes out. She needs her sugar checked too." This writer went to report findings to triage PA. This writer checks blood sugar. Patient family member reassured provider was reviewing chart and would reassess as needed. Patient family member states "well we will sue you." This Probation officer says ok.

## 2022-11-07 NOTE — ED Notes (Signed)
Pt to triage room to be seen by provider in triage.  Pt cleaned and brief changed, pt tolerated well.

## 2022-11-08 DIAGNOSIS — R531 Weakness: Secondary | ICD-10-CM | POA: Diagnosis not present

## 2022-11-08 DIAGNOSIS — Z7401 Bed confinement status: Secondary | ICD-10-CM | POA: Diagnosis not present

## 2022-11-08 NOTE — ED Notes (Signed)
PTAR called  

## 2022-11-08 NOTE — ED Notes (Signed)
Ptar called 

## 2022-11-08 NOTE — Discharge Instructions (Signed)
Your work up in the ED was reassuring. We advise continued follow up with your primary care doctor and orthopedist. Return for new or concerning symptoms.

## 2022-11-08 NOTE — ED Provider Triage Note (Signed)
Emergency Medicine Provider Triage Evaluation Note  Christine Newton , a 88 y.o. female  was evaluated in triage.  Pt with hx of dementia. Daughter complains of R hip swelling and bruising. States that she noticed it when visiting the patient at [facility]. Patient was there for rehab post left hip surgery. Patient currently w/o complaints of pain, though verbalizes anxiety of falling.  Review of Systems  Positive: As above Negative: As above  Physical Exam  BP 108/62 (BP Location: Right Arm)   Pulse 91   Temp 98.8 F (37.1 C) (Oral)   Resp 18   SpO2 98%  Gen:   Awake, no distress   Resp:  Normal effort  MSK:   Bruising and mass (hematoma?) to the R lateral, posterior hip without significant leg shortening or malrotation. No obvious R hip TTP on exam. No crepitus.  Compartments of the right lower extremity are soft and compressible.  Extremity is warm, well-perfused.  Medical Decision Making  Medically screening exam initiated at 12:36 AM.  Appropriate orders placed.  Christine Newton was informed that the remainder of the evaluation will be completed by another provider, this initial triage assessment does not replace that evaluation, and the importance of remaining in the ED until their evaluation is complete.  R hip bruising - Xray negative for definitive fx. Will obtain CT to r/o occult fx. Labs ordered given degree of bruising/bleeding.   Antonietta Breach, PA-C 11/08/22 [phone]

## 2022-11-08 NOTE — ED Provider Notes (Signed)
[facility] EMERGENCY DEPARTMENT Provider Note   CSN: [id number] Arrival date & time: 11/07/22  1546     History  Chief Complaint  Patient presents with   Hip Pain    Christine Newton is a 88 y.o. female.  88 y/o female with hx of dementia, HTN, DM presents to the ED with daughter for complaint of R hip swelling and bruising. Daughter states that she noticed it when visiting the patient at [facility]. Patient was there for rehab post left hip surgery. Patient currently w/o complaints of pain, though verbalizes anxiety of falling. Patient is unable to recall any recent fall or trauma and the facility does not have a report of such event. The patient has been on Lovenox for postoperative DVT prevention.   Hip Pain      Home Medications Prior to Admission medications   Medication Sig Start Date End Date Taking? Authorizing Provider  aspirin EC 81 MG tablet Take 1 tablet (81 mg total) by mouth daily. Swallow whole. Patient taking differently: Take 81 mg by mouth daily. 01/21/21   Frann Rider, NP  atorvastatin (LIPITOR) 40 MG tablet Take 1 tablet (40 mg total) by mouth daily. 03/03/22   Lillard Anes, MD  Carboxymethylcellulose Sodium (DRY EYE RELIEF OP) Place 1 drop into both eyes daily as needed (dry eyes).    [provider]  enoxaparin (LOVENOX) 40 MG/0.4ML injection Inject 0.4 mLs (40 mg total) into the skin daily. 10/08/22   Caren Griffins, MD  HYDROcodone-acetaminophen (NORCO/VICODIN) 5-325 MG tablet Take 1 tablet by mouth every 6 (six) hours as needed for moderate pain or severe pain. 10/07/22   Caren Griffins, MD  metFORMIN (GLUCOPHAGE) 500 MG tablet TAKE 2 EVERY MORNING AND 1 EVERY EVENING 10/07/22   [name], [name], MD  metoprolol tartrate (LOPRESSOR) 25 MG tablet Take 1 tablet (25 mg total) by mouth 2 (two) times daily. 10/07/22   Caren Griffins, MD      Allergies    Cephalosporins    Review of Systems   Review of  Systems Ten systems reviewed and are negative for acute change, except as noted in the HPI.    Physical Exam Updated Vital Signs BP 129/77 (BP Location: Right Arm)   Pulse 95   Temp 98.5 F (36.9 C) (Oral)   Resp 16   SpO2 98%   Physical Exam Vitals and nursing note reviewed.  Constitutional:      General: She is not in acute distress.    Appearance: She is well-developed. She is not diaphoretic.     Comments: Nontoxic appearing and in NAD  HENT:     Head: Normocephalic and atraumatic.  Eyes:     General: No scleral icterus.    Conjunctiva/sclera: Conjunctivae normal.  Cardiovascular:     Rate and Rhythm: Normal rate and regular rhythm.     Pulses: Normal pulses.     Comments: DP pulse 2+ BLE Pulmonary:     Effort: Pulmonary effort is normal. No respiratory distress.     Comments: Respirations even and unlabored Musculoskeletal:        General: No tenderness or deformity. Normal range of motion.     Cervical back: Normal range of motion.     Comments: Bruising and mass (hematoma?) to the R lateral, posterior hip without significant leg shortening or malrotation. Bruising with yellowing discoloration suggesting old injury. No obvious R hip TTP on exam. No crepitus.  Compartments of the right lower  extremity are soft and compressible.  Extremity is warm, well-perfused.  Skin:    General: Skin is warm and dry.     Coloration: Skin is not pale.     Findings: No rash.  Neurological:     Mental Status: She is alert.     Coordination: Coordination normal.     Comments: Patient has not been ambulatory since her L hip surgery, per daughter  Psychiatric:        Behavior: Behavior normal.    ED Results / Procedures / Treatments   Labs (all labs ordered are listed, but only abnormal results are displayed) Labs Reviewed  CBC WITH DIFFERENTIAL/PLATELET - Abnormal; Notable for the following components:      Result Value   WBC 12.2 (*)    RBC 3.04 (*)    Hemoglobin 9.0 (*)     HCT 29.0 (*)    RDW 18.5 (*)    Neutro Abs 8.6 (*)    Abs Immature Granulocytes 0.37 (*)    All other components within normal limits  BASIC METABOLIC PANEL - Abnormal; Notable for the following components:   Glucose, Bld 176 (*)    BUN 24 (*)    Calcium 8.0 (*)    GFR, Estimated 59 (*)    All other components within normal limits  CBG MONITORING, ED - Abnormal; Notable for the following components:   Glucose-Capillary 169 (*)    All other components within normal limits    EKG None  Radiology CT Hip Right Wo Contrast  Result Date: 11/08/2022 CLINICAL DATA:  Dementia, fall, right hip pain. EXAM: CT OF THE RIGHT HIP WITHOUT CONTRAST TECHNIQUE: Multidetector CT imaging of the right hip was performed according to the standard protocol. Multiplanar CT image reconstructions were also generated. RADIATION DOSE REDUCTION: This exam was performed according to the departmental dose-optimization program which includes automated exposure control, adjustment of the mA and/or kV according to patient size and/or use of iterative reconstruction technique. COMPARISON:  None Available. FINDINGS: Bones/Joint/Cartilage Normal alignment. No acute fracture or dislocation. Bone island noted within the right pubic symphysis. Mild right hip degenerative arthritis with joint space narrowing noted. Ligaments Suboptimally assessed by CT. Muscles and Tendons Diffuse moderate atrophy of the skeletal musculature. Iliopsoas, gluteus, and hamstring tendons appear intact. Soft tissues Infiltration within the soft tissues lateral to the right hip in keeping with edema or interstitial hemorrhage. No mass formed hematoma identified. Advanced vascular calcifications are noted within the right lower extremity. A small left inguinal hernia contains and unremarkable loop of a small-bowel, incompletely evaluated on this exam. IMPRESSION: 1. No acute fracture or dislocation of the right hip. 2. Mild right hip degenerative arthritis.  3. Soft tissue swelling lateral to the right hip. Electronically Signed   By: Fidela Salisbury M.D.   On: 11/08/2022 00:09   DG Hip Unilat W or Wo Pelvis 2-3 Views Right  Result Date: 11/07/2022 CLINICAL DATA:  Bruising and swelling. Surgery on left hip about a month ago. Previously on right hip found by patient's daughter 3 days ago. EXAM: DG HIP (WITH OR WITHOUT PELVIS) 2-3V RIGHT COMPARISON:  Pelvis and left hip radiographs 10/01/2022 FINDINGS: There is diffuse decreased bone mineralization. Within this limitation, no definite acute fracture is seen. Mild well corticated chronic ossicles superior to the right greater trochanter and distal right femoral neck are unchanged from 10/01/2022 prior radiographs. Partial visualization of left-sided cephalomedullary fixation of the previously seen comminuted and displaced proximal femoral intertrochanteric fracture. Improved alignment. Moderate  to high-grade atherosclerotic calcifications. IMPRESSION: 1. Partial visualization of left-sided cephalomedullary fixation of the previously seen comminuted and displaced proximal femoral intertrochanteric fracture. Improved alignment. 2. Within the limitations of diffuse decreased bone mineralization, no definite acute right hip or hemipelvis fracture is seen. Electronically Signed   By: Yvonne Kendall M.D.   On: 11/07/2022 18:42    Procedures Procedures    Medications Ordered in ED Medications - No data to display  ED Course/ Medical Decision Making/ A&P                           Medical Decision Making Amount and/or Complexity of Data Reviewed Labs: ordered. Radiology: ordered.   This patient presents to the ED for concern of R hip swelling and bruising, this involves an extensive number of treatment options, and is a complaint that carries with it a high risk of complications and morbidity.  The differential diagnosis includes hip dislocation vs hip fx vs hematoma vs sprain/strain   Co morbidities that  complicate the patient evaluation  Dementia   Additional history obtained:  Additional history obtained from daughter at bedside External records from outside source obtained and reviewed including prior hip imaging from 10/01/22   Lab Tests:  I Ordered, and personally interpreted labs.  The pertinent results include:  Stable Hgb of 9. Leukocytosis of 12.2 is improved compared to 1 month ago.  Imaging Studies ordered:  I ordered imaging studies including Xray R hip and pelvis and CT R hip  I independently visualized and interpreted imaging which showed no bony pathology or fx. I agree with the radiologist interpretation   Cardiac Monitoring:  The patient was maintained on a cardiac monitor.  I personally viewed and interpreted the cardiac monitored which showed an underlying rhythm of: NSR   Medicines ordered and prescription drug management:  I have reviewed the patients home medicines and have made adjustments as needed   Test Considered:  PT/INR   Reevaluation:  After the interventions noted above, I reevaluated the patient and found that they have :stayed the same   Social Determinants of Health:  Good social support; lives with daughter   Dispostion:  After consideration of the diagnostic results and the patients response to treatment, I feel that the patent would benefit from outpatient PCP follow up. Return precautions discussed and provided. Patient discharged in stable condition. Patient and daughter with no unaddressed concerns. Transported home via [place].         Final Clinical Impression(s) / ED Diagnoses Final diagnoses:  Contusion of right hip, initial encounter    Rx / DC Orders ED Discharge Orders     None         Antonietta Breach, PA-C 11/08/22 [place], April, MD 11/08/22 [phone]

## 2022-11-14 ENCOUNTER — Telehealth: Payer: Self-pay

## 2022-11-14 NOTE — Telephone Encounter (Signed)
     Patient  visit on 11/14  at [facility]   Have you been able to follow up with your primary care physician? Yes   The patient was or was not able to obtain any needed medicine or equipment. Yes   Are there diet recommendations that you are having difficulty following? Yes   Patient expresses understanding of discharge instructions and education provided has no other needs at this time. Yes      [place], [facility], Care Management  [phone] [address], [place], [place] [address] Phone: [phone] Email: [email or web address]

## 2022-11-14 NOTE — Progress Notes (Unsigned)
Cardiology Office Note:    Date:  11/14/2022   ID:  Katalea Shiver [place], [place] December 26, 1933, MRN [id number]  PCP:  Lillard Anes, [place] Prov[facility] Cardiologist:  Sinclair Grooms, MD     Referring MD: Lillard Anes,*   Chief Complaint: elevated BP  History of Present Illness:    Ashonte Shiver Lawrance is a very pleasant 88 y.o. female with a hx of nonrheumatic aortic stenosis s/p aortic valve replacement with #21 CE biologic with septal myectomy 2014, hypertension, hyperlipidemia, diabetes.   She established care with our group 11/18/2020 at which time she reported shortness of breath. Echocardiogram revealed normal LVEF, valve leaflets a little stiffer than previous echo in 2019 at [facility], no concerns per Dr. Tamala Julian  She was last seen in our office on 02/19/2021 by Dr. Tamala Julian at which time she had had a recent hospital stay for an episode of syncope. Blood pressures were running relatively low, presumed to be related to hypotension. Therefore amlodipine was discontinued.  ACE inhibitor therapy was continued.  BP readings at home reported to be 120-145 mmHg at the time of the office visit.  Felt to have a TIA during that admission so difficult to discern if it was related to low BP or a true TIA.  Aspirin and Plavix was started but Plavix had to be discontinued because of rectal bleeding after discharged.  She was also placed on Keppra which she could not tolerate.  Imaging demonstrated severe small vessel CNS disease.  Work-up included an EEG which raises the question of underlying seizure activity.  She was feeling well at the time of the visit and no changes were made to her treatment regimen. Was advised to follow-up in 1 year  Her daughter contacted our office on 07/08/2022 with concerns for elevated blood pressure of SBP 150s, 90-100 DBP. Office visit was scheduled.  I saw her on 07/13/22 for evaluation of elevated BP. Much of her history is provided by her daughter,  Olegario Shearer. [name] reports elevated diastolic blood pressure > 90 mmHg for about 2 weeks.  Additionally she gets SOB with activity. No edema, orthopnea, PND. Admits that she does a lot of sitting. [name] reports that her breathing sounds short when she is sleeping, no apnea, mild snoring on occasion. Limits sodium intake with an occasional restaurant meal higher in sodium. Had lengthy hospitalization followed by rehab following a fall with pelvis fractures on 01/19/22. Patient denies chest pain, lower extremity edema, fatigue, palpitations, melena, hematuria, hemoptysis, diaphoresis, weakness, presyncope, syncope, orthopnea, and PND.  Episodes of unresponsiveness prompting admission 08/2022.  Echo 09/01/2022 revealed LVEF 55 to 60%, moderate LVH, grade 1 diastolic dysfunction, normal RV, normal aortic valve prosthesis, no significant change from prior study.  Cardiac monitor 10/23/2022 revealed basic rhythm is NSR with IVCD and first-degree AV block, frequent WCT at rates > 150 BPM.  Likely SVT with IVCD rather than VT, PVC burden less than 1%, no atrial fibrillation.  Past Medical History:  Diagnosis Date   Diabetes ([place])    Hypertension    Nonrheumatic aortic (valve) stenosis 09/21/2020    Past Surgical History:  Procedure Laterality Date   CARDIAC VALVE REPLACEMENT     CHOLECYSTECTOMY     INTRAMEDULLARY (IM) NAIL INTERTROCHANTERIC Left 10/02/2022   Procedure: INTRAMEDULLARY (IM) NAIL INTERTROCHANTERIC;  Surgeon: Vanetta Mulders, MD;  Location: [facility];  Service: Orthopedics;  Laterality: Left;    Current Medications: No outpatient medications have been marked as taking for the  11/16/22 encounter (Appointment) with Ann Maki, Lanice Schwab, NP.     Allergies:   Cephalosporins   Social History   Socioeconomic History   Marital status: Widowed    Spouse name: Not on file   Number of children: 2   Years of education: 12 + 4   Highest education level: Not on file  Occupational History   Occupation:  RETIRED SOCIAL WORKER  Tobacco Use   Smoking status: Never   Smokeless tobacco: Never  Vaping Use   Vaping Use: Never used  Substance and Sexual Activity   Alcohol use: Never   Drug use: Never   Sexual activity: Not Currently  Other Topics Concern   Not on file  Social History Narrative   Not on file   Social Determinants of Health   Financial Resource Strain: Not on file  Food Insecurity: Not on file  Transportation Needs: Not on file  Physical Activity: Not on file  Stress: Not on file  Social Connections: Not on file     Family History: The patient's family history is not on file.  ROS:   Please see the history of present illness.    + DOE All other systems reviewed and are negative.  Labs/Other Studies Reviewed:    The following studies were reviewed today:  Cardiac monitor 10/23/22    Basic rhythm is NSR with IVCD and 1 degree AVB   Frequent WCT at rates > 150 bpm. Likely SVT with IVCD rather than VT.   PVC burden < 1%   No atrial fibrillation.   Echo 09/01/22  1. S/P AVR with mean gradient 18 mmHg and AVA 1.9 cm2.   2. Left ventricular ejection fraction, by estimation, is 55 to 60%. The  left ventricle has normal function. The left ventricle has no regional  wall motion abnormalities. There is moderate left ventricular hypertrophy.  Left ventricular diastolic  parameters are consistent with Grade I diastolic dysfunction (impaired  relaxation).   3. Right ventricular systolic function is normal. The right ventricular  size is normal.   4. The mitral valve is normal in structure. Trivial mitral valve  regurgitation. No evidence of mitral stenosis. Moderate mitral annular  calcification.   5. The aortic valve has been repaired/replaced. Aortic valve  regurgitation is trivial. No aortic stenosis is present. There is a 21 mm  Carpentier-Edwards bioprosthetic valve present in the aortic position.   6. The inferior vena cava is normal in size with greater  than 50%  respiratory variability, suggesting right atrial pressure of 3 mmHg.   Comparison(s): No significant change from prior study.  Echo 12/14/20  1. Left ventricular ejection fraction, by estimation, is 50 to 55%. The  left ventricle has low normal function. The left ventricle has no regional  wall motion abnormalities. There is mild left ventricular hypertrophy.  Left ventricular diastolic  parameters are consistent with Grade I diastolic dysfunction (impaired  relaxation). Elevated left ventricular end-diastolic pressure. There is  incoordinate septal motion.   2. Right ventricular systolic function is mildly reduced. The right  ventricular size is normal.   3. The mitral valve is abnormal. Trivial mitral valve regurgitation.  Moderate mitral annular calcification.   4. The aortic valve is a Carpentier-Edwards 21 mm bioprosthetic. Aortic  valve regurgitation is not visualized. Aortic valve area, by VTI measures  1.09 cm. Aortic valve mean gradient measures 19.0 mmHg. Aortic valve Vmax  measures 2.82 m/s. Peak gradient   32 mmHg. DI is 0.38. Findings are likely  normal given the size/position  of this valve.   5. The inferior vena cava is normal in size with greater than 50%  respiratory variability, suggesting right atrial pressure of 3 mmHg.   Comparison(s): Prior images unable to be directly viewed, comparison made  by report only. Changes from prior study are noted. 07/13/18: An echo was  last performed at [facility] in 2019. LVEF was reportedly 55%.  bioprosthetic AVR mean gradient of 16  mmHg.    Recent Labs: 10/06/2022: ALT 12 10/07/2022: Magnesium 1.9 11/07/2022: BUN 24; Creatinine, Ser 0.93; Hemoglobin 9.0; Platelets 315; Potassium 3.5; Sodium 145  Recent Lipid Panel    Component Value Date/Time   CHOL 167 07/12/2022 1520   TRIG 99 07/12/2022 1520   HDL 48 07/12/2022 1520   CHOLHDL 3.5 07/12/2022 1520   CHOLHDL 4.1 12/14/2020 0431   VLDL 14 12/14/2020  0431   LDLCALC 101 (H) 07/12/2022 1520     Risk Assessment/Calculations:       Physical Exam:    VS:  There were no vitals taken for this visit.    Wt Readings from Last 3 Encounters:  10/02/22 142 lb (64.4 kg)  09/19/22 142 lb (64.4 kg)  09/02/22 150 lb (68 kg)     GEN:  Frail elderly female in no acute distress HEENT: Normal NECK: No JVD; No carotid bruits CARDIAC: RRR, slight murmur consistent with AS/AVR auscultated at bilateral upper sternal borders. No rubs, gallops RESPIRATORY:  Clear to auscultation without rales, wheezing or rhonchi  ABDOMEN: Soft, non-tender, non-distended MUSCULOSKELETAL:  No edema; No deformity. 2+ pedal pulses, equal bilaterally SKIN: Warm and dry NEUROLOGIC:  Alert and oriented x 3 PSYCHIATRIC:  Normal affect   EKG:  EKG is not ordered today.    Diagnoses:    No diagnosis found.  Assessment and Plan:     Dyspnea: She reports DOE, not significantly increased recently. No orthopnea, PND, edema. Limits sodium intake. Admits to being inactive. Encouraged her to walk around for several minutes each hour while awake. Daughter states she was also advised to increase activity by PCP at office visit on 07/12/22.  We will repeat echocardiogram in 5 to 6 months.  Aortic valve stenosis s/p AVR: No concerns for worsening valve function today. We will get follow-up echocardiogram in 5-6 months prior to follow-up appointment.  Left bundle branch block: Low normal LVEF 50-55% by echo 11/2020. No indication of bradycardia.  No episodes of lightheadedness, presyncope, syncope. We will repeat echo in 6 months, prior to follow-up appointment.   Hypertension: BP initally elevated, improved upon my recheck. Concern for recent increase in DBP > 90 mmHg x 2 weeks. Saw PCP 7/18 and lisinopril was changed to lisinopril-hctz. Lab work revealed hypernatremia, advised to increase water intake. Was additionally advised to increase exercise.   Hypernatremia: Na+ 149 on  07/12/22. Was advised by PCP to increase water intake.    Disposition: ***   Medication Adjustments/Labs and Tests Ordered: Current medicines are reviewed at length with the patient today.  Concerns regarding medicines are outlined above.  No orders of the defined types were placed in this encounter.  No orders of the defined types were placed in this encounter.   There are no Patient Instructions on file for this visit.   Signed, Emmaline Life, NP  11/14/2022 7:38 PM    [place]

## 2022-11-15 ENCOUNTER — Telehealth (INDEPENDENT_AMBULATORY_CARE_PROVIDER_SITE_OTHER): Payer: Medicare PPO | Admitting: Legal Medicine

## 2022-11-15 ENCOUNTER — Ambulatory Visit: Payer: Medicare PPO | Admitting: Legal Medicine

## 2022-11-15 ENCOUNTER — Telehealth: Payer: Self-pay | Admitting: Orthopaedic Surgery

## 2022-11-15 ENCOUNTER — Telehealth: Payer: Self-pay | Admitting: Nurse Practitioner

## 2022-11-15 ENCOUNTER — Encounter: Payer: Self-pay | Admitting: Legal Medicine

## 2022-11-15 VITALS — BP 145/83

## 2022-11-15 DIAGNOSIS — D352 Benign neoplasm of pituitary gland: Secondary | ICD-10-CM | POA: Diagnosis not present

## 2022-11-15 DIAGNOSIS — I10 Essential (primary) hypertension: Secondary | ICD-10-CM | POA: Diagnosis not present

## 2022-11-15 DIAGNOSIS — Z6823 Body mass index (BMI) 23.0-23.9, adult: Secondary | ICD-10-CM

## 2022-11-15 DIAGNOSIS — F03B0 Unspecified dementia, moderate, without behavioral disturbance, psychotic disturbance, mood disturbance, and anxiety: Secondary | ICD-10-CM

## 2022-11-15 DIAGNOSIS — E782 Mixed hyperlipidemia: Secondary | ICD-10-CM | POA: Diagnosis not present

## 2022-11-15 DIAGNOSIS — E1159 Type 2 diabetes mellitus with other circulatory complications: Secondary | ICD-10-CM

## 2022-11-15 DIAGNOSIS — E669 Obesity, unspecified: Secondary | ICD-10-CM

## 2022-11-15 DIAGNOSIS — E1169 Type 2 diabetes mellitus with other specified complication: Secondary | ICD-10-CM | POA: Diagnosis not present

## 2022-11-15 NOTE — Telephone Encounter (Signed)
  Pt's daughter calling, she said, pt is wheelchair bound and it takes 2 people to get her up. She wants to know what will be the appt going to be she wants to know so they can prepare for it

## 2022-11-15 NOTE — Telephone Encounter (Signed)
Patient daughter wants to schedule a virtual visits please call to get this schedule she cx appointment for tomorrow her mom can not walk. Best number to reach her [phone]

## 2022-11-15 NOTE — Telephone Encounter (Signed)
I spoke with patient's daughter. She reports patient had hip fracture and surgery.  She then had another fall and was evaluated in ED.  Daughter reports patient's mobility is very limited and she would not be able to stand at appointment tomorrow if this was needed.  Daughter is paying for transport service to bring patient to appointment tomorrow.  Daughter would be able to do telemedicine visit.  Will check with Christen Bame, NP to see if appointment tomorrow could be changed to video visit

## 2022-11-15 NOTE — Telephone Encounter (Signed)
Reviewed with Elwyn Reach, NP and OK to change to video visit.  Patient's daughter notified.  She has done my chart video visits in the past and is aware how to log in.  She will check patient's vital signs and have them available when office calls tomorrow.

## 2022-11-15 NOTE — Progress Notes (Signed)
 Virtual Visit via Video Note   This visit type was conducted due to national recommendations for restrictions regarding the COVID-19 Pandemic (e.g. social distancing) in an effort to limit this patient's exposure and mitigate transmission in our community.  Due to her co-morbid illnesses, this patient is at least at moderate risk for complications without adequate follow up.  This format is felt to be most appropriate for this patient at this time.  All issues noted in this document were discussed and addressed.  A limited physical exam was performed with this format.  A verbal consent was obtained for the virtual visit.   Date:  11/15/2022   ID:  Christine Newton, DOB 07/12/1933, MRN [id number]  Patient Location: Home Provider Location: Office/Clinic  PCP:  ,  Edward, MD   Evaluation Performed:  New Patient Evaluation  Chief Complaint:  transition of care  History of Present Illness:    Christine Newton is a 89 y.o. female with patient is an 89-year-old female fractured her left hip on 10/01/2022.  Hip was pinned and she was discharged on 10/07/2022.  Patient was then transferred to [facility] where she did not pain ambulatory status she developed pressure ulcers on her buttock and her right foot.  She continued to have speech and mentation problems on 11/07/2022 she fell off the commode and had an unwitnessed fall damaging her right hip.  She was transferred to the emergency room at [place] and x-rays did not show any further fractures or dislocations of the hip.  Patient's labs did show a potassium of 3.5 a blood glucose of 176 BUN of 24 with EGFR being 59 and calcium 8.0 white blood count was 12,200 with elevated neutrophils but no infection was observed.  Patient was then transferred home.  At home patient is in a hospital bed still is remains confused and is having to have speech therapy.  She has a regular mattress and hospital bed and bunny boots  for pain.  She has a 2 sonometer decubitus ulcer on her back but caregiver cannot tell me how deep and has ulcer on right heel they are treating it with with dressing per visiting nurse.  He is also getting physical therapy at home and speech therapy.  She remains unable to get out of bed by herself and requires 1-2+ help to the toilet.  They are trying to turn her every 2 hours to keep the pressure off her back we may need to talk about getting a water mattress if we can get home health to okay this.  Patient does have a living will according to the caregiver.  At present it seems unlikely that patient will be able to come to the office at least in the near future.  She seems to be bedbound and only slowly improving with complications of dementia fractured hip and bedsores.  The patient does not have symptoms concerning for COVID-19 infection (fever, chills, cough, or new shortness of breath).    Past Medical History:  Diagnosis Date   Diabetes ([facility])    Hypertension    Nonrheumatic aortic (valve) stenosis 09/21/2020    Past Surgical History:  Procedure Laterality Date   CARDIAC VALVE REPLACEMENT     CHOLECYSTECTOMY     INTRAMEDULLARY (IM) NAIL INTERTROCHANTERIC Left 10/02/2022   Procedure: INTRAMEDULLARY (IM) NAIL INTERTROCHANTERIC;  Surgeon: Bokshan, Steven, MD;  Location: [facility] OR;  Service: Orthopedics;  Laterality: Left;    History reviewed. No pertinent   family history.  Social History   Socioeconomic History   Marital status: Widowed    Spouse name: Not on file   Number of children: 2   Years of education: 12 + 4   Highest education level: Not on file  Occupational History   Occupation: RETIRED SOCIAL WORKER  Tobacco Use   Smoking status: Never   Smokeless tobacco: Never  Vaping Use   Vaping Use: Never used  Substance and Sexual Activity   Alcohol use: Never   Drug use: Never   Sexual activity: Not Currently  Other Topics Concern   Not on file  Social History Narrative    Not on file   Social Determinants of Health   Financial Resource Strain: Not on file  Food Insecurity: Not on file  Transportation Needs: Not on file  Physical Activity: Not on file  Stress: Not on file  Social Connections: Not on file  Intimate Partner Violence: Not on file    Outpatient Medications Prior to Visit  Medication Sig Dispense Refill   aspirin EC 81 MG tablet Take 1 tablet (81 mg total) by mouth daily. Swallow whole. (Patient taking differently: Take 81 mg by mouth daily.) 30 tablet 11   atorvastatin (LIPITOR) 40 MG tablet Take 1 tablet (40 mg total) by mouth daily. 90 tablet 1   Carboxymethylcellulose Sodium (DRY EYE RELIEF OP) Place 1 drop into both eyes daily as needed (dry eyes).     enoxaparin (LOVENOX) 40 MG/0.4ML injection Inject 0.4 mLs (40 mg total) into the skin daily. 0 mL    HYDROcodone-acetaminophen (NORCO/VICODIN) 5-325 MG tablet Take 1 tablet by mouth every 6 (six) hours as needed for moderate pain or severe pain. 10 tablet 0   metFORMIN (GLUCOPHAGE) 500 MG tablet TAKE 2 EVERY MORNING AND 1 EVERY EVENING 270 tablet 0   metoprolol tartrate (LOPRESSOR) 25 MG tablet Take 1 tablet (25 mg total) by mouth 2 (two) times daily.     No facility-administered medications prior to visit.    Allergies:   Cephalosporins   Social History   Tobacco Use   Smoking status: Never   Smokeless tobacco: Never  Vaping Use   Vaping Use: Never used  Substance Use Topics   Alcohol use: Never   Drug use: Never     Review of Systems  Constitutional: Negative.   HENT:  Positive for hearing loss.   Eyes:  Negative for blurred vision and double vision.  Respiratory: Negative.    Cardiovascular:  Negative for chest pain and palpitations.  Gastrointestinal: Negative.   Genitourinary: Negative.   Musculoskeletal:  Positive for falls. Negative for joint pain.  Skin:        Decubitus ulcers back and heel  Neurological:  Positive for speech change and weakness.   Psychiatric/Behavioral:  Positive for memory loss.      Labs/Other Tests and Data Reviewed:    Recent Labs: 10/06/2022: ALT 12 10/07/2022: Magnesium 1.9 11/07/2022: BUN 24; Creatinine, Ser 0.93; Hemoglobin 9.0; Platelets 315; Potassium 3.5; Sodium 145   Recent Lipid Panel Lab Results  Component Value Date/Time   CHOL 167 07/12/2022 03:20 PM   TRIG 99 07/12/2022 03:20 PM   HDL 48 07/12/2022 03:20 PM   CHOLHDL 3.5 07/12/2022 03:20 PM   CHOLHDL 4.1 12/14/2020 04:31 AM   LDLCALC 101 (H) 07/12/2022 03:20 PM    Wt Readings from Last 3 Encounters:  10/02/22 142 lb (64.4 kg)  09/19/22 142 lb (64.4 kg)  09/02/22 150 lb (68 kg)  Objective:    Vital Signs:  BP 145/83  Physical Exam patient is seen on a video visit she is sleeping through most of the conversation and having with the caregiver.  At the end she did open her eyes and say a few things and says she is not having any problems.  Caregiver was unable to manipulate the computers to see decubitus.  ASSESSMENT & PLAN:    Problem List Items Addressed This Visit       Cardiovascular and Mediastinum   Essential hypertension - Primary Patient has blood pressure that is her usual meds 140 as recommended by cardiologist for optimal cerebral perfusion.  We will hold off on her metoprolol since she is not taking it.  She is to see cardiologist tomorrow.    Obesity, diabetes, and hypertension syndrome ([place]) Caregiver tells me  Her blood sugars are running about 120 fasting     Endocrine   Adenoma of pituitary [facility]) Patient has had no further complications or adenoma of the pituitary that was being followed by [place] and Aud[facility]   Dementia [facility]) Pain patient has chronic dementia we are unable to do a Mini-Mental status on her today.  She presently is a total care patient but is getting physical therapy speech therapy and treatment for her and leg ulcer.  Murray Hodgkins recommend turning patient every 2 hours  and trying to get a water mattress or least in eggcrate mattress to assist pressure relief on the buttocks and feet.     Other   Hyperlipidemia Patient continues to have hypercholesterolemia but is stable   BMI 23.0-23.9, adult I was unable to get a recent weight since they are unable to get her to stand without falling.  .  Orders Placed This Encounter  Procedures   CBC with Differential/Platelet   Comprehensive metabolic panel   Hemoglobin A1c  I am hoping that cardiologist are able to draw her blood when she sees them tomorrow at their office.    COVID-19 Education: The signs and symptoms of COVID-19 were discussed with the patient and how to seek care for testing (follow up with PCP or arrange E-visit). The importance of social distancing was discussed today.   I spent 45 minutes dedicated to the care of this patient on the date of this encounter to include face-to-face time with the patient, as well as: Extensive discussion with caregiver over treatment she will be seen orthopedics and cardiology tomorrow.  We will continue to hold off on her metoprolol until she sees cardiology since her blood pressure is staying stable per her usual.  Follow Up:  In Person prn  Signed, Reinaldo Meeker, MD  11/15/2022 12:16 PM    [place]

## 2022-11-16 ENCOUNTER — Encounter: Payer: Self-pay | Admitting: Nurse Practitioner

## 2022-11-16 ENCOUNTER — Ambulatory Visit: Payer: Medicare PPO | Attending: Nurse Practitioner | Admitting: Nurse Practitioner

## 2022-11-16 ENCOUNTER — Encounter (HOSPITAL_BASED_OUTPATIENT_CLINIC_OR_DEPARTMENT_OTHER): Payer: Medicare PPO | Admitting: Orthopaedic Surgery

## 2022-11-16 VITALS — BP 143/84 | HR 91 | Ht 66.0 in

## 2022-11-16 DIAGNOSIS — Z952 Presence of prosthetic heart valve: Secondary | ICD-10-CM | POA: Diagnosis not present

## 2022-11-16 DIAGNOSIS — I10 Essential (primary) hypertension: Secondary | ICD-10-CM | POA: Diagnosis not present

## 2022-11-16 DIAGNOSIS — R002 Palpitations: Secondary | ICD-10-CM | POA: Diagnosis not present

## 2022-11-16 DIAGNOSIS — I471 Supraventricular tachycardia, unspecified: Secondary | ICD-10-CM

## 2022-11-16 MED ORDER — METOPROLOL TARTRATE 25 MG PO TABS: 12.5000 mg | ORAL_TABLET | Freq: Two times a day (BID) | ORAL | 3 refills | Status: DC

## 2022-11-16 NOTE — Patient Instructions (Signed)
Medication Instructions:   START Lopressor one half (0.5) tablet by mouth (12.5 mg) twice daily.  *If you need a refill on your cardiac medications before your next appointment, please call your pharmacy*   Lab Work:  None ordered.  If you have labs (blood work) drawn today and your tests are completely normal, you will receive your results only by: [place] (if you have MyChart) OR A paper copy in the mail If you have any lab test that is abnormal or we need to change your treatment, we will call you to review the results.   Testing/Procedures:  None ordered.   Follow-Up: At [facility], you and your health needs are our priority.  As part of our continuing mission to provide you with exceptional heart care, we have created designated Provider Care Teams.  These Care Teams include your primary Cardiologist (physician) and Advanced Practice Providers (APPs -  Physician Assistants and Nurse Practitioners) who all work together to provide you with the care you need, when you need it.  We recommend signing up for the patient portal called "MyChart".  Sign up information is provided on this After Visit Summary.  MyChart is used to connect with patients for Virtual Visits (Telemedicine).  Patients are able to view lab/test results, encounter notes, upcoming appointments, etc.  Non-urgent messages can be sent to your provider as well.   To learn more about what you can do with MyChart, go to [email or web address].    Your next appointment:   As needed   The format for your next appointment:   As needed  Provider:   Jenne Campus, MD    Other Instructions    Important Information About [place]

## 2022-11-17 ENCOUNTER — Encounter: Payer: Self-pay | Admitting: Nurse Practitioner

## 2022-11-18 ENCOUNTER — Emergency Department (HOSPITAL_COMMUNITY): Payer: Medicare PPO

## 2022-11-18 ENCOUNTER — Encounter (HOSPITAL_COMMUNITY): Payer: Self-pay | Admitting: Emergency Medicine

## 2022-11-18 ENCOUNTER — Emergency Department (HOSPITAL_COMMUNITY)
Admission: EM | Admit: 2022-11-18 | Discharge: 2022-11-18 | Disposition: A | Discharge status: Home / Self Care | Payer: Medicare PPO | Attending: Emergency Medicine | Admitting: Emergency Medicine

## 2022-11-18 ENCOUNTER — Other Ambulatory Visit: Payer: Self-pay

## 2022-11-18 DIAGNOSIS — R29818 Other symptoms and signs involving the nervous system: Secondary | ICD-10-CM | POA: Diagnosis not present

## 2022-11-18 DIAGNOSIS — Z7901 Long term (current) use of anticoagulants: Secondary | ICD-10-CM | POA: Diagnosis not present

## 2022-11-18 DIAGNOSIS — R55 Syncope and collapse: Secondary | ICD-10-CM | POA: Diagnosis not present

## 2022-11-18 DIAGNOSIS — I6523 Occlusion and stenosis of bilateral carotid arteries: Secondary | ICD-10-CM | POA: Diagnosis not present

## 2022-11-18 DIAGNOSIS — I959 Hypotension, unspecified: Secondary | ICD-10-CM | POA: Diagnosis not present

## 2022-11-18 DIAGNOSIS — T68XXXA Hypothermia, initial encounter: Secondary | ICD-10-CM | POA: Diagnosis not present

## 2022-11-18 DIAGNOSIS — R569 Unspecified convulsions: Secondary | ICD-10-CM | POA: Diagnosis not present

## 2022-11-18 DIAGNOSIS — Z7982 Long term (current) use of aspirin: Secondary | ICD-10-CM | POA: Insufficient documentation

## 2022-11-18 DIAGNOSIS — R531 Weakness: Secondary | ICD-10-CM | POA: Diagnosis not present

## 2022-11-18 DIAGNOSIS — G319 Degenerative disease of nervous system, unspecified: Secondary | ICD-10-CM | POA: Diagnosis not present

## 2022-11-18 DIAGNOSIS — I6501 Occlusion and stenosis of right vertebral artery: Secondary | ICD-10-CM | POA: Diagnosis not present

## 2022-11-18 DIAGNOSIS — I639 Cerebral infarction, unspecified: Secondary | ICD-10-CM | POA: Diagnosis not present

## 2022-11-18 DIAGNOSIS — I10 Essential (primary) hypertension: Secondary | ICD-10-CM | POA: Insufficient documentation

## 2022-11-18 DIAGNOSIS — R0902 Hypoxemia: Secondary | ICD-10-CM | POA: Diagnosis not present

## 2022-11-18 DIAGNOSIS — D352 Benign neoplasm of pituitary gland: Secondary | ICD-10-CM | POA: Diagnosis not present

## 2022-11-18 DIAGNOSIS — E785 Hyperlipidemia, unspecified: Secondary | ICD-10-CM | POA: Insufficient documentation

## 2022-11-18 DIAGNOSIS — Z7984 Long term (current) use of oral hypoglycemic drugs: Secondary | ICD-10-CM | POA: Diagnosis not present

## 2022-11-18 DIAGNOSIS — Z7401 Bed confinement status: Secondary | ICD-10-CM | POA: Diagnosis not present

## 2022-11-18 DIAGNOSIS — R000 Tachycardia, unspecified: Secondary | ICD-10-CM | POA: Diagnosis not present

## 2022-11-18 LAB — COMPREHENSIVE METABOLIC PANEL
ALT: 11 U/L (ref 0–44)
AST: 23 U/L (ref 15–41)
Albumin: 3.1 g/dL — ABNORMAL LOW (ref 3.5–5.0)
Alkaline Phosphatase: 130 U/L — ABNORMAL HIGH (ref 38–126)
Anion gap: 13 (ref 5–15)
BUN: 14 mg/dL (ref 8–23)
CO2: 21 mmol/L — ABNORMAL LOW (ref 22–32)
Calcium: 9 mg/dL (ref 8.9–10.3)
Chloride: 107 mmol/L (ref 98–111)
Creatinine, Ser: 0.92 mg/dL (ref 0.44–1.00)
GFR, Estimated: >60 mL/min (ref >60)
Glucose, Bld: 200 mg/dL — ABNORMAL HIGH (ref 70–99)
Potassium: 4.3 mmol/L (ref 3.5–5.1)
Sodium: 141 mmol/L (ref 135–145)
Total Bilirubin: 1.2 mg/dL (ref 0.3–1.2)
Total Protein: 6.4 g/dL — ABNORMAL LOW (ref 6.5–8.1)

## 2022-11-18 LAB — PROTIME-INR
INR: 1 (ref 0.8–1.2)
Prothrombin Time: 13.2 seconds (ref 11.4–15.2)

## 2022-11-18 LAB — CBC
HCT: 38.5 % (ref 36.0–46.0)
Hemoglobin: 12.1 g/dL (ref 12.0–15.0)
MCH: 29.5 pg (ref 26.0–34.0)
MCHC: 31.4 g/dL (ref 30.0–36.0)
MCV: 93.9 fL (ref 80.0–100.0)
Platelets: 354 10*3/uL (ref 150–400)
RBC: 4.1 MIL/uL (ref 3.87–5.11)
RDW: 17.2 % — ABNORMAL HIGH (ref 11.5–15.5)
WBC: 13.7 10*3/uL — ABNORMAL HIGH (ref 4.0–10.5)
nRBC: 0 % (ref 0.0–0.2)

## 2022-11-18 LAB — URINALYSIS, ROUTINE W REFLEX MICROSCOPIC
Bilirubin Urine: NEGATIVE
Glucose, UA: 50 mg/dL — AB
Hgb urine dipstick: NEGATIVE
Ketones, ur: NEGATIVE mg/dL
Leukocytes,Ua: NEGATIVE
Nitrite: NEGATIVE
Protein, ur: NEGATIVE mg/dL
Specific Gravity, Urine: 1.029 (ref 1.005–1.030)
pH: 7 (ref 5.0–8.0)

## 2022-11-18 LAB — DIFFERENTIAL
Abs Immature Granulocytes: 0.49 10*3/uL — ABNORMAL HIGH (ref 0.00–0.07)
Basophils Absolute: 0.2 10*3/uL — ABNORMAL HIGH (ref 0.0–0.1)
Basophils Relative: 1 %
Eosinophils Absolute: 0.2 10*3/uL (ref 0.0–0.5)
Eosinophils Relative: 1 %
Immature Granulocytes: 4 %
Lymphocytes Relative: 29 %
Lymphs Abs: 3.9 10*3/uL (ref 0.7–4.0)
Monocytes Absolute: 0.7 10*3/uL (ref 0.1–1.0)
Monocytes Relative: 5 %
Neutro Abs: 8.2 10*3/uL — ABNORMAL HIGH (ref 1.7–7.7)
Neutrophils Relative %: 60 %

## 2022-11-18 LAB — I-STAT CHEM 8, ED
BUN: 17 mg/dL (ref 8–23)
Calcium, Ion: 1.05 mmol/L — ABNORMAL LOW (ref 1.15–1.40)
Chloride: 105 mmol/L (ref 98–111)
Creatinine, Ser: 0.8 mg/dL (ref 0.44–1.00)
Glucose, Bld: 207 mg/dL — ABNORMAL HIGH (ref 70–99)
HCT: 37 % (ref 36.0–46.0)
Hemoglobin: 12.6 g/dL (ref 12.0–15.0)
Potassium: 4.3 mmol/L (ref 3.5–5.1)
Sodium: 141 mmol/L (ref 135–145)
TCO2: 24 mmol/L (ref 22–32)

## 2022-11-18 LAB — APTT: aPTT: 24 seconds (ref 24–36)

## 2022-11-18 LAB — ETHANOL: Alcohol, Ethyl (B): <10 mg/dL (ref <10)

## 2022-11-18 LAB — CBG MONITORING, ED: Glucose-Capillary: 200 mg/dL — ABNORMAL HIGH (ref 70–99)

## 2022-11-18 MED ORDER — DIVALPROEX SODIUM 250 MG PO DR TAB
500.0000 mg | DELAYED_RELEASE_TABLET | Freq: Two times a day (BID) | ORAL | Status: DC
  Administered 2022-11-18: 500 mg via ORAL
  Filled 2022-11-18: qty 2

## 2022-11-18 MED ORDER — IOHEXOL 350 MG/ML SOLN
75.0000 mL | Freq: Once | INTRAVENOUS | Status: AC | PRN
  Administered 2022-11-18: 75 mL via INTRAVENOUS

## 2022-11-18 MED ORDER — SODIUM CHLORIDE 0.9% FLUSH: 3.0000 mL | Freq: Once | INTRAVENOUS | Status: DC

## 2022-11-18 MED ORDER — VALPROATE SODIUM 100 MG/ML IV SOLN
1000.0000 mg | Freq: Once | INTRAVENOUS | Status: AC
  Administered 2022-11-18: 1000 mg via INTRAVENOUS
  Filled 2022-11-18: qty 10

## 2022-11-18 MED ORDER — DIVALPROEX SODIUM 500 MG PO DR TAB: 500.0000 mg | DELAYED_RELEASE_TABLET | Freq: Two times a day (BID) | ORAL | 1 refills | Status: DC

## 2022-11-18 NOTE — Progress Notes (Signed)
EEG complete - results pending 

## 2022-11-18 NOTE — Procedures (Signed)
Patient Name: Christine Newton  MRN: [id number]  Epilepsy Attending: Lora Havens  Referring Physician/Provider: Greta Doom, MD   Date: 11/18/2022 Duration: 22.48 mins  Patient history: year-old female with a history of significant concern for seizures who had an episode of left-sided contracture with gaze deviation to the left followed by what appears to be a postictal state. EEG to evaluate for seizure  Level of alertness: Awake  AEDs during EEG study: VPA  Technical aspects: This EEG study was done with scalp electrodes positioned according to the 10-20 International system of electrode placement. Electrical activity was reviewed with band pass filter of 1-'70Hz'$ , sensitivity of 7 uV/mm, display speed of 34m/sec with a '60Hz'$  notched filter applied as appropriate. EEG data were recorded continuously and digitally stored.  Video monitoring was available and reviewed as appropriate.  Description: No clear posterior dominant rhythm was seen. EEG showed continuous generalized 3 to 6 Hz theta-delta slowing. Hyperventilation and photic stimulation were not performed.     ABNORMALITY - Continuous slow, generalized  IMPRESSION: This study is suggestive of moderate diffuse encephalopathy, nonspecific etiology. No seizures or epileptiform discharges were seen throughout the recording.  Loralai Eisman OBarbra Sarks

## 2022-11-18 NOTE — Code Documentation (Addendum)
Stroke Response Nurse Documentation Code Documentation  Christine Newton is a 88 y.o. female arriving to [facility]  via [facility] on 11/18/2022 with past medical hx of diabetes, HTN, aortic stenosis, TIA, dementia. On aspirin 81 mg daily. Code stroke was activated by EMS.   Patient from home where she was LKW at 1430 and now complaining of left sided weakness, left gaze. Patient was at home eating when she became unresponsive for approximately 45 minutes. Pt has known syncopal episodes that last for approximately 10 minutes, per family report. When EMS arrived they noted she had left gaze and that her left arm was contracted.  Stroke team at the bedside on patient arrival. Labs drawn and patient cleared for CT by Dr. Tamera Punt. Patient to CT with team. NIHSS 9, see documentation for details and code stroke times. Patient with disoriented, right gaze preference, left hemianopia, and bilateral leg weakness on exam.   The following imaging was completed:  CT Head and CTA, MRI.   Patient is not a candidate for IV Thrombolytic due to symptoms improving. Patient is not a candidate for IR due to symptoms improving.   Care Plan: Code stroke cancelled at [place]  Bedside handoff with ED RN Caryl Pina.    Jane Broughton L Jammal Sarr  Rapid Response RN

## 2022-11-18 NOTE — Discharge Instructions (Addendum)
Thank you for coming to [facility] Emergency Department. Christine Newton was seen for seizure or stroke-like activity. We did an exam, labs, and imaging, and these showed small chronic strokes in her brain as well as likely seizure activity. She was loaded with Depakote here in the ED and had an EEG. We will prescribe Depakote DR 500 mg twice daily and please follow up with your neurologist within a couple of weeks. Call them on Monday morning to make an appointment.    Do not hesitate to return to the ED or call 911 if you experience: -Worsening symptoms -Further seizure activity -Altered mental status -Lightheadedness, passing out -Fevers/chills -Anything else that concerns you

## 2022-11-18 NOTE — Consult Note (Signed)
Neurology Consultation Reason for Consult: Seizure Referring Physician: Nausea, H  CC: Episode of unresponsiveness  History is obtained from: EMS, chart  HPI: Christine Newton is a 88 y.o. female with a history of recurrent episodes of syncope of unclear etiology.  There has been concern for seizure in the past and she was actually started on Keppra at one point, but she was unable to tolerate it.  Tonight while eating dinner, she had a sudden episode of unresponsiveness.  EMS was called and on their arrival, they found her with a left gaze deviation with a contracted left upper extremity and unresponsive patient.  Her left arm then relaxed and she began talking.  On arrival to the bridge, she had a right gaze preference but was able to cross midline to the left, and appeared to possibly have a left visual field deficit as well.  Given her sudden change a code stroke and been activated, and she was taken for CT/CTA.  Given the concern for the visual field change, she was taken for an emergent MRI which was negative as well for acute stroke.   LKW: 75 tpa given?: no, not a stroke  Past Medical History:  Diagnosis Date   Diabetes ([place])    Hypertension    Nonrheumatic aortic (valve) stenosis 09/21/2020     History reviewed. No pertinent family history.   Social History:  reports that she has never smoked. She has never used smokeless tobacco. She reports that she does not drink alcohol and does not use drugs.   Exam: Current vital signs: BP 125/83   Pulse (!) 112   Ht '5\' 6"'$  (1.676 m)   Wt 62.4 kg   SpO2 100%   BMI 22.20 kg/m  Vital signs in last 24 hours: Pulse Rate:  [112] 112 (11/24 1540) BP: (125)/(83) 125/83 (11/24 1540) SpO2:  [100 %] 100 % (11/24 1540) Weight:  [62.4 kg] 62.4 kg (11/24 1612)   Physical Exam  Constitutional: Appears well-developed and well-nourished.  Psych: Affect appropriate to situation Eyes: No scleral injection HENT: No OP  obstruction MSK: no joint deformities.  Cardiovascular: Normal rate and regular rhythm.  Respiratory: Effort normal, non-labored breathing GI: Soft.  No distension. There is no tenderness.  Skin: WDI  Neuro: Mental Status: Patient is able to tell me her name, but unable to give her age or month. Cranial Nerves: II: Initially she appeared to have a left partial field cut versus visual neglect, but this improved over the course of my examination III,IV, VI: She initially had a right gaze preference, but this improved also over the course of my examination V: Facial sensation is symmetric to temperature VII: Facial movement is symmetric.  VIII: hearing is intact to voice X: Uvula elevates symmetrically XI: Shoulder shrug is symmetric. XII: tongue is midline without atrophy or fasciculations.  Motor: Tone is normal. Bulk is normal. 5/5 strength was present in bilateral arms, limited in the left leg due to recent hip surgery, gives poor effort in the right leg but able to lift against gravity Sensory: Sensation is symmetric to light touch and temperature in the arms and legs.  She does not extinguish Cerebellar: FNF without ataxia      I have reviewed labs in epic and the results pertinent to this consultation are: Creatinine 0.8  I have reviewed the images obtained: CT/CTA-negative, axial diffusion sequence appears negative  Impression: 88 year old female with a history of significant concern for seizures who had an episode of left-sided  contracture with gaze deviation to the left followed by what appears to be a postictal state.  I strongly suspect that this was a seizure.  As long as she continues to improve, I would favor starting her back on an antiepileptic.  She did not tolerate Keppra in the past due to lethargy, and therefore I would recommend Depakote 500 mg twice daily, we can give an IV 1 g load now.  Recommendations: 1) Depacon 1 g x 1 2) start on Depakote DR 500 mg  twice daily 3) could consider EEG 4) if she returns to baseline, and has no further episodes after a few hours, could consider discharge with outpatient follow-up.  If she continues to be confused then I would favor admission and EEG would be necessary at that point. 5) neurology will follow if admitted   Roland Rack, MD [facility] [phone]  If 7pm- 7am, please page neurology on call as listed in [place].

## 2022-11-18 NOTE — ED Provider Notes (Signed)
[facility] EMERGENCY DEPARTMENT Provider Note   CSN: [id number] Arrival date & time: 11/18/22  1536  An emergency department physician performed an initial assessment on this suspected stroke patient at 1539.  History  Chief Complaint  Patient presents with   Code Stroke    Christine Newton is a 88 y.o. female with HTN, HLD, pituitary adenoma, s/p AVR, h/o TIA, dementia, T2DM, seizure-like activity presents with stroke code.  Patient presents as a stroke code after had a sudden episode of unresponsiveness occurring while eating dinner. EMS was called and on their arrival, they found her with a left gaze deviation with a contracted left upper extremity and unresponsive patient.  Her left arm then relaxed and she began talking. On arrival to the bridge, she had a right gaze preference but was able to cross midline to the left, and appeared to possibly have a left visual field deficit as well. She does not take blood thinners.   Per neurology's chart review, there has been concern for seizure in the past and she was actually started on Keppra at one point, but she was unable to tolerate it.  Neurology at bedside at the bridge prior to CT scan.      LKN 1430 BP 125/83 POC glucose 200  HPI     Home Medications Prior to Admission medications   Medication Sig Start Date End Date Taking? Authorizing Provider  aspirin EC 81 MG tablet Take 1 tablet (81 mg total) by mouth daily. Swallow whole. Patient taking differently: Take 81 mg by mouth daily. 01/21/21   Frann Rider, NP  atorvastatin (LIPITOR) 40 MG tablet Take 1 tablet (40 mg total) by mouth daily. 03/03/22   Lillard Anes, MD  Carboxymethylcellulose Sodium (DRY EYE RELIEF OP) Place 1 drop into both eyes daily as needed (dry eyes).    [provider]  enoxaparin (LOVENOX) 40 MG/0.4ML injection Inject 0.4 mLs (40 mg total) into the skin daily. 10/08/22   Caren Griffins, MD   HYDROcodone-acetaminophen (NORCO/VICODIN) 5-325 MG tablet Take 1 tablet by mouth every 6 (six) hours as needed for moderate pain or severe pain. 10/07/22   Caren Griffins, MD  metFORMIN (GLUCOPHAGE) 500 MG tablet TAKE 2 EVERY MORNING AND 1 EVERY EVENING 10/07/22   [name], [name], MD  metoprolol tartrate (LOPRESSOR) 25 MG tablet Take 0.5 tablets (12.5 mg total) by mouth 2 (two) times daily. 11/16/22 11/17/23  Swinyer, Lanice Schwab, NP      Allergies    Cephalosporins    Review of Systems   Review of Systems Review of systems obtained from daughter at bedside negative for f/c, recent illnesses.  A 10 point review of systems was performed and is negative unless otherwise reported in HPI.  Physical Exam Updated Vital Signs BP 125/83   Pulse (!) 112   Ht '5\' 6"'$  (1.676 m)   Wt 62.4 kg   SpO2 100%   BMI 22.20 kg/m  Physical Exam General: Normal appearing elderly female, lying in bed.  HEENT: PERRLA, Sclera anicteric, MMM, trachea midline. Cardiology: RRR, no murmurs/rubs/gallops. BL radial and DP pulses equal bilaterally.  Resp: Normal respiratory rate and effort. CTAB, no wheezes, rhonchi, crackles.  Abd: Soft, non-tender, non-distended. No rebound tenderness or guarding.  GU: Deferred. MSK: No peripheral edema or signs of trauma. Extremities without deformity or TTP. No cyanosis or clubbing. Skin: warm, dry. No rashes or lesions. Neuro: A&Ox1, CNs II-XII grossly intact. Sensation grossly intact. Normal gaze, no gaze preference.  Tongue protrudes midline. 5/5 strength in BL UEs, lifts RLE against gravity. Strength limited in LLE d/t recent hip surgery and pain. Normal FTN.     ED Results / Procedures / Treatments   Labs (all labs ordered are listed, but only abnormal results are displayed) Labs Reviewed  CBC - Abnormal; Notable for the following components:      Result Value   WBC 13.7 (*)    RDW 17.2 (*)    All other components within normal limits  DIFFERENTIAL - Abnormal;  Notable for the following components:   Neutro Abs 8.2 (*)    Basophils Absolute 0.2 (*)    Abs Immature Granulocytes 0.49 (*)    All other components within normal limits  I-STAT CHEM 8, ED - Abnormal; Notable for the following components:   Glucose, Bld 207 (*)    Calcium, Ion 1.05 (*)    All other components within normal limits  CBG MONITORING, ED - Abnormal; Notable for the following components:   Glucose-Capillary 200 (*)    All other components within normal limits  PROTIME-INR  APTT  ETHANOL  COMPREHENSIVE METABOLIC PANEL    EKG EKG Interpretation  Date/Time:  Friday November 18 2022 17:12:05 EST Ventricular Rate:  91 PR Interval:  163 QRS Duration: 143 QT Interval:  435 QTC Calculation: 536 R Axis:   6 Text Interpretation: Sinus rhythm LAE, consider biatrial enlargement Left bundle branch block No significant change since last tracing Confirmed by Cindee Lame [phone]) on 11/18/2022 8:57:24 PM  Radiology CT HEAD CODE STROKE WO CONTRAST  Result Date: 11/18/2022 CLINICAL DATA:  Code stroke. Neuro deficit, acute, stroke suspected. EXAM: CT HEAD WITHOUT CONTRAST TECHNIQUE: Contiguous axial images were obtained from the base of the skull through the vertex without intravenous contrast. RADIATION DOSE REDUCTION: This exam was performed according to the departmental dose-optimization program which includes automated exposure control, adjustment of the mA and/or kV according to patient size and/or use of iterative reconstruction technique. COMPARISON:  Head CT 10/01/2022 and MRI 08/18/2022 FINDINGS: Brain: There is no evidence of an acute infarct, intracranial hemorrhage, mass, midline shift, or extra-axial fluid collection. Chronic infarcts are again noted in the left frontal and parietal lobes, right corona radiata, left basal ganglia, and cerebellum. Mild-to-moderate hypodensities elsewhere in the cerebral white matter bilaterally are unchanged and nonspecific but compatible  with chronic small vessel ischemic disease. There is mild cerebral atrophy. 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 Vascular: Calcified atherosclerosis at the skull base. No hyperdense vessel. Skull: No acute fracture or suspicious osseous lesion. 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 Clear mastoid air cells. Bilateral cataract extraction. Other: None IMPRESSION: 1. No evidence of acute intracranial abnormality. 2. Chronic infarcts and chronic small vessel ischemic disease. 3. Grossly unchanged sellar/suprasellar mass. These results were communicated to Dr. Leonel Ramsay at 4:02 pm on 11/18/2022 by text page via the [facility] messaging system. Electronically Signed   By: Logan Bores M.D.   On: 11/18/2022 16:02    Procedures .Critical Care  Performed by: Audley Hose, MD Authorized by: Audley Hose, MD   Critical care provider statement:    Critical care time (minutes):  35   Critical care was necessary to treat or prevent imminent or life-threatening deterioration of the following conditions:  CNS failure or compromise   Critical care was time spent personally by me on the following activities:  Development of treatment plan with  patient or surrogate, discussions with consultants, evaluation of patient's response to treatment, examination of patient, ordering and review of laboratory studies, ordering and review of radiographic studies, ordering and performing treatments and interventions, pulse oximetry, re-evaluation of patient's condition, review of old charts and obtaining history from patient or surrogate     Medications Ordered in ED Medications  sodium chloride flush (NS) 0.9 % injection 3 mL (3 mLs Intravenous Not Given 11/18/22 1608)  iohexol (OMNIPAQUE) 350 MG/ML injection 75 mL (75 mLs Intravenous Contrast Given 11/18/22 1554)    ED Course/ Medical Decision Making/  A&P                          Medical Decision Making Amount and/or Complexity of Data Reviewed Labs: ordered. Decision-making details documented in ED Course. Radiology:  Decision-making details documented in ED Course.  Risk Prescription drug management.   Patient HDS at this time, overall well-appearing. No longer with any neuro deficits on my examination. She lives at home, Daughter cares for her and patient has home health as well.   Given the acute onset of neurological symptoms, stroke is a concerning possible etiology of these acute symptoms. The neuro exam is significant for resolved gaze preference and LUE deficits. Given the acute resolution and history of possible seizure activity, also consider seizure as possible etiology of these symptoms. Will evaluate for acute stroke as well as electrolyte abnormalities,  brain tumor or abscess, underlying epilepsy, vascular malformation such as AVM. Very low concern given clinical picture for alcohol/drug withdrawal, toxic ingestion given patient's functional status. No e/o head trauma or hypoglycemia.    Plan: - stat head CT, consider additional imaging including CTA and MRI  - Manage hypertension as needed -- sBP goal 140-160 if ICH - labs & other orders as below   I have personally reviewed and interpreted all labs and imaging.  Patient was maintained on a cardiac monitor.  I have personally interpreted the telemetry as NSR.  Clinical Course as of 11/19/22 1048  Fri Nov 18, 2022  1733 WBC(!): 13.7 [HN]  1948 MR BRAIN WO CONTRAST 1. No acute intracranial abnormality. 2. Chronic small vessel ischemia with multiple chronic infarcts as above. A chronic right periatrial white matter infarct is new from 08/18/2022. 3. Unchanged sellar/suprasellar mass.   [HN]  1948 CT ANGIO HEAD NECK W WO CM (CODE STROKE) 1. Mild intracranial atherosclerosis without a large vessel occlusion or significant proximal intracranial stenosis. 2.  Unchanged 50% stenosis of the proximal left subclavian artery. 3. Unchanged mild-to-moderate stenosis of the right vertebral artery origin. 4.  Aortic Atherosclerosis (ICD10-I70.0).   [HN]  2038 EEG happening now [HN]    Clinical Course User Index [HN] Audley Hose, MD   Discussed extensively with neurology and patient's daughter at bedside. After several hours, patient has returned to essentially her baseline. She answers many questions with "I don't know" which daughter says is her typical response. Neurology feels that this presentation is much more likely to represent a seizure rather than a TIA, and CTA/MRI brain confirm no LVO or CVA, only intracranial atherosclerosis. Neuro ordered a spot EEG to evaluate as well for seizure which they state we do not need to await the results of this before discharging the patient. Neurology recommends starting depakote, and patient is loaded here IV, given that patient didn't tolerate Keppra in the past. Labs are largely unremarkable, including CMP, EtOH, coags. CBC remarkable for WBC 13 but she  has h/o leukocytosis and could also possibly be reactive, no fever or infectious symptoms per daughter at bedside. Patient already has neurologist and daughter will call on Monday morning to schedule appt. Patient does not drive or swim anyway d/t functional status. Considered admission but do not believe it is necessary at this time, and pt can f/u o/p with neuro. Patient's daughter is given DC instructions/return precautions. All questions answered to daughter's satisfaction.   Dispo: DC         Final Clinical Impression(s) / ED Diagnoses Final diagnoses:  Seizure-like activity ([place])    Rx / DC Orders ED Discharge Orders     None        This note was created using dictation software, which may contain spelling or grammatical errors.    Audley Hose, MD 11/21/22 [phone]

## 2022-11-18 NOTE — ED Triage Notes (Signed)
Pt BIB EMS from home for stroke like symptoms. Family states that pt was eating when she suddenly went unresponsive for approx. 45 minutes. Hx of same family states this episode lasted longer than normal. EMS reports pt had L sided gaze and L arm contracted upon arrival. Hx of dementia. Pt reports visual changes and confusion x1 week.

## 2022-11-23 ENCOUNTER — Encounter: Payer: Self-pay | Admitting: Legal Medicine

## 2022-11-23 ENCOUNTER — Encounter (HOSPITAL_BASED_OUTPATIENT_CLINIC_OR_DEPARTMENT_OTHER): Payer: Medicare PPO | Admitting: Orthopaedic Surgery

## 2022-11-23 DIAGNOSIS — R569 Unspecified convulsions: Secondary | ICD-10-CM

## 2022-11-28 DIAGNOSIS — E119 Type 2 diabetes mellitus without complications: Secondary | ICD-10-CM | POA: Diagnosis not present

## 2022-11-28 DIAGNOSIS — I447 Left bundle-branch block, unspecified: Secondary | ICD-10-CM | POA: Diagnosis not present

## 2022-11-28 DIAGNOSIS — L89891 Pressure ulcer of other site, stage 1: Secondary | ICD-10-CM | POA: Diagnosis not present

## 2022-11-28 DIAGNOSIS — L89152 Pressure ulcer of sacral region, stage 2: Secondary | ICD-10-CM | POA: Diagnosis not present

## 2022-11-28 DIAGNOSIS — E042 Nontoxic multinodular goiter: Secondary | ICD-10-CM | POA: Diagnosis not present

## 2022-11-28 DIAGNOSIS — L89621 Pressure ulcer of left heel, stage 1: Secondary | ICD-10-CM | POA: Diagnosis not present

## 2022-11-28 DIAGNOSIS — I119 Hypertensive heart disease without heart failure: Secondary | ICD-10-CM | POA: Diagnosis not present

## 2022-11-28 DIAGNOSIS — L89312 Pressure ulcer of right buttock, stage 2: Secondary | ICD-10-CM | POA: Diagnosis not present

## 2022-11-28 DIAGNOSIS — I081 Rheumatic disorders of both mitral and tricuspid valves: Secondary | ICD-10-CM

## 2022-11-28 DIAGNOSIS — F0390 Unspecified dementia without behavioral disturbance: Secondary | ICD-10-CM | POA: Diagnosis not present

## 2022-11-28 MED ORDER — DIVALPROEX SODIUM 250 MG PO DR TAB: 250.0000 mg | DELAYED_RELEASE_TABLET | Freq: Two times a day (BID) | ORAL | 1 refills | Status: DC

## 2022-11-30 DIAGNOSIS — L89152 Pressure ulcer of sacral region, stage 2: Secondary | ICD-10-CM | POA: Diagnosis not present

## 2022-11-30 DIAGNOSIS — L89891 Pressure ulcer of other site, stage 1: Secondary | ICD-10-CM

## 2022-11-30 DIAGNOSIS — L89621 Pressure ulcer of left heel, stage 1: Secondary | ICD-10-CM

## 2022-11-30 DIAGNOSIS — I119 Hypertensive heart disease without heart failure: Secondary | ICD-10-CM | POA: Diagnosis not present

## 2022-11-30 DIAGNOSIS — L89312 Pressure ulcer of right buttock, stage 2: Secondary | ICD-10-CM

## 2022-11-30 DIAGNOSIS — I447 Left bundle-branch block, unspecified: Secondary | ICD-10-CM

## 2022-11-30 DIAGNOSIS — E042 Nontoxic multinodular goiter: Secondary | ICD-10-CM

## 2022-11-30 DIAGNOSIS — F0390 Unspecified dementia without behavioral disturbance: Secondary | ICD-10-CM | POA: Diagnosis not present

## 2022-11-30 DIAGNOSIS — E119 Type 2 diabetes mellitus without complications: Secondary | ICD-10-CM | POA: Diagnosis not present

## 2022-11-30 DIAGNOSIS — I081 Rheumatic disorders of both mitral and tricuspid valves: Secondary | ICD-10-CM

## 2022-12-02 ENCOUNTER — Other Ambulatory Visit: Payer: Self-pay | Admitting: Legal Medicine

## 2022-12-08 ENCOUNTER — Encounter: Payer: Self-pay | Admitting: Legal Medicine

## 2022-12-09 ENCOUNTER — Encounter: Payer: Self-pay | Admitting: Legal Medicine

## 2022-12-09 NOTE — Telephone Encounter (Signed)
I know only what the hospital said.  I feel it is best to stop medicine and lesast for one week and see how her behavior changes.

## 2022-12-14 ENCOUNTER — Other Ambulatory Visit (HOSPITAL_COMMUNITY): Payer: Medicare PPO

## 2022-12-20 ENCOUNTER — Other Ambulatory Visit: Payer: Self-pay | Admitting: Legal Medicine

## 2022-12-20 ENCOUNTER — Encounter: Payer: Self-pay | Admitting: Family Medicine

## 2022-12-20 DIAGNOSIS — R569 Unspecified convulsions: Secondary | ICD-10-CM

## 2022-12-20 NOTE — Telephone Encounter (Signed)
Patient needs a chronic visit prior to further refills. Please call the office. Thank you. Dr. Tobie Poet

## 2022-12-23 ENCOUNTER — Telehealth: Payer: Self-pay | Admitting: Orthopaedic Surgery

## 2022-12-23 NOTE — Telephone Encounter (Signed)
Patient daughter called in stating she is having a hard time with transportation being she is in a wheelchair she cannot fit her mother in her car and they cannot afford transportation at this time with it being almost $300 per trip, home health PT nurse is concerned about a lump on her leg and it is very painful and the Nurse suggested Dr Sammuel Hines look at it in person, the daughter is wondering if we can either provide transportation for her to get here or if we can have someone come to the residence and take a look at the leg she is very concerned being they are unsure of what the lump on her leg is and it is close to the knee where the rod is. Please advise. You can either call Vikki at [phone] or message her Via MyChart.

## 2022-12-27 NOTE — Telephone Encounter (Signed)
IC advised likely best thing would be non-emergent transport through EMS due to current difficulty she describes her mother to have.  She wanted to make sure Dr Jacinto Reap and Lambert Mody aware patient could not stand for xray.  They will keep appt scheduled for Friday for evaluation.

## 2022-12-28 ENCOUNTER — Encounter: Payer: Self-pay | Admitting: Neurology

## 2022-12-30 ENCOUNTER — Encounter (HOSPITAL_BASED_OUTPATIENT_CLINIC_OR_DEPARTMENT_OTHER): Payer: Medicare PPO | Admitting: Orthopaedic Surgery

## 2022-12-30 ENCOUNTER — Other Ambulatory Visit: Payer: Self-pay | Admitting: Neurology

## 2022-12-30 DIAGNOSIS — R569 Unspecified convulsions: Secondary | ICD-10-CM

## 2022-12-30 MED ORDER — DIVALPROEX SODIUM 250 MG PO DR TAB: 250.0000 mg | DELAYED_RELEASE_TABLET | ORAL | 0 refills | Status: DC

## 2022-12-30 NOTE — Progress Notes (Signed)
Patient daughter sent a message saying that her mother is still quite lethargic on Depakote DR 250 twice daily and when she had stopped it completely before she had a seizure-like episode.  I recommend changing dose to Depakote DR to 250 once a day instead and see how she does

## 2023-01-06 NOTE — Telephone Encounter (Signed)
LEFT VOICEMAIL FOR PT TO CALL THE MAIN OFFICE TO GET AN APPT SCHEDULED

## 2023-01-11 ENCOUNTER — Ambulatory Visit (INDEPENDENT_AMBULATORY_CARE_PROVIDER_SITE_OTHER): Payer: Medicare PPO | Admitting: Orthopaedic Surgery

## 2023-01-11 ENCOUNTER — Other Ambulatory Visit: Payer: Self-pay

## 2023-01-11 ENCOUNTER — Emergency Department (HOSPITAL_BASED_OUTPATIENT_CLINIC_OR_DEPARTMENT_OTHER): Payer: Medicare PPO

## 2023-01-11 ENCOUNTER — Emergency Department (HOSPITAL_BASED_OUTPATIENT_CLINIC_OR_DEPARTMENT_OTHER)
Admission: EM | Admit: 2023-01-11 | Discharge: 2023-01-11 | Disposition: A | Discharge status: Home / Self Care | Payer: Medicare PPO | Attending: Emergency Medicine | Admitting: Emergency Medicine

## 2023-01-11 ENCOUNTER — Encounter (HOSPITAL_BASED_OUTPATIENT_CLINIC_OR_DEPARTMENT_OTHER): Payer: Self-pay

## 2023-01-11 DIAGNOSIS — I10 Essential (primary) hypertension: Secondary | ICD-10-CM | POA: Diagnosis not present

## 2023-01-11 DIAGNOSIS — S3282XA Multiple fractures of pelvis without disruption of pelvic ring, initial encounter for closed fracture: Secondary | ICD-10-CM

## 2023-01-11 DIAGNOSIS — G40919 Epilepsy, unspecified, intractable, without status epilepticus: Secondary | ICD-10-CM

## 2023-01-11 DIAGNOSIS — E119 Type 2 diabetes mellitus without complications: Secondary | ICD-10-CM | POA: Insufficient documentation

## 2023-01-11 DIAGNOSIS — Z1152 Encounter for screening for COVID-19: Secondary | ICD-10-CM | POA: Insufficient documentation

## 2023-01-11 DIAGNOSIS — R569 Unspecified convulsions: Secondary | ICD-10-CM | POA: Diagnosis present

## 2023-01-11 DIAGNOSIS — M25562 Pain in left knee: Secondary | ICD-10-CM

## 2023-01-11 LAB — BASIC METABOLIC PANEL
Anion gap: 11 (ref 5–15)
BUN: 21 mg/dL (ref 8–23)
CO2: 25 mmol/L (ref 22–32)
Calcium: 8.6 mg/dL — ABNORMAL LOW (ref 8.9–10.3)
Chloride: 108 mmol/L (ref 98–111)
Creatinine, Ser: 0.72 mg/dL (ref 0.44–1.00)
GFR, Estimated: >60 mL/min (ref >60)
Glucose, Bld: 159 mg/dL — ABNORMAL HIGH (ref 70–99)
Potassium: 3.9 mmol/L (ref 3.5–5.1)
Sodium: 144 mmol/L (ref 135–145)

## 2023-01-11 LAB — RESP PANEL BY RT-PCR (RSV, FLU A&B, COVID)  RVPGX2
Influenza A by PCR: NEGATIVE
Influenza B by PCR: NEGATIVE
Resp Syncytial Virus by PCR: NEGATIVE
SARS Coronavirus 2 by RT PCR: NEGATIVE

## 2023-01-11 LAB — CBC
HCT: 37.3 % (ref 36.0–46.0)
Hemoglobin: 12 g/dL (ref 12.0–15.0)
MCH: 28.8 pg (ref 26.0–34.0)
MCHC: 32.2 g/dL (ref 30.0–36.0)
MCV: 89.4 fL (ref 80.0–100.0)
Platelets: 325 10*3/uL (ref 150–400)
RBC: 4.17 MIL/uL (ref 3.87–5.11)
RDW: 17.7 % — ABNORMAL HIGH (ref 11.5–15.5)
WBC: 12 10*3/uL — ABNORMAL HIGH (ref 4.0–10.5)
nRBC: 0 % (ref 0.0–0.2)

## 2023-01-11 LAB — CBG MONITORING, ED: Glucose-Capillary: 146 mg/dL — ABNORMAL HIGH (ref 70–99)

## 2023-01-11 MED ORDER — DIVALPROEX SODIUM 250 MG PO DR TAB
250.0000 mg | DELAYED_RELEASE_TABLET | Freq: Once | ORAL | Status: AC
  Administered 2023-01-11: 250 mg via ORAL
  Filled 2023-01-11: qty 1

## 2023-01-11 NOTE — ED Triage Notes (Signed)
Was at ortho office for appointment when had a seizure.  Hx of seizures.  Unresponsive at first but at present eye open and responding to daughter

## 2023-01-11 NOTE — ED Provider Notes (Signed)
Emergency Department Provider Note   I have reviewed the triage vital signs and the nursing notes.   HISTORY  Chief Complaint Seizures   HPI Christine Newton is a 89 y.o. female with PMH of DM, HTN and seizure disorder (not on AEDs currently) presents to the emergency department for evaluation after seizure at a doctor's visit today.  Patient is accompanied by her daughter who provides most of the history.  She states that the patient recently came off of Depakote, approximately 2 weeks ago, due to sedation issues.  She sees Dr. Leonie Man with [facility] neurology and has a follow-up appointment scheduled in February.  Patient had been on Keppra in the past as well but did not tolerate that either.  She is currently on no medications.  Daughter states that she is more alert and was able to get to her doctor's appointment today but shortly after arriving she became less responsive, began to drool, and had excessive yawning.  Her seizures are typically partial with right arm repetitive movements and gaze deviation.  This episode was unusual and that it had excessive yawning and drooling.  No falls or head injury.  No generalized tonic-clonic activity.  Patient has since returned to her mental status baseline.    Past Medical History:  Diagnosis Date   Diabetes ([place])    Hypertension    Nonrheumatic aortic (valve) stenosis 09/21/2020    Review of Systems  Constitutional: No fever/chills Cardiovascular: Denies chest pain. Respiratory: Denies shortness of breath. Gastrointestinal: No abdominal pain.  Skin: Negative for rash. Neurological: Negative for headaches, focal weakness or numbness. Positive breakthrough seizure today.    ____________________________________________   PHYSICAL EXAM:  VITAL SIGNS: ED Triage Vitals  Enc Vitals Group     BP 01/11/23 1452 116/72     Pulse Rate 01/11/23 1452 97     Resp 01/11/23 1452 18     Temp 01/11/23 1452 98.5 F (36.9 C)     Temp Source  01/11/23 1452 Oral     SpO2 01/11/23 1452 98 %     Weight 01/11/23 1452 145 lb (65.8 kg)     Height 01/11/23 1452 '5\' 6"'$  (1.676 m)   Constitutional: Alert but confused with baseline dementia. Well appearing and in no acute distress. Eyes: Conjunctivae are normal. PERRL.  Head: Atraumatic. Nose: No congestion/rhinnorhea. Mouth/Throat: Mucous membranes are moist.   Neck: No stridor.  Cardiovascular: Normal rate, regular rhythm. Good peripheral circulation. Grossly normal heart sounds.   Respiratory: Normal respiratory effort.  No retractions. Lungs CTAB. Gastrointestinal: Soft and nontender. No distention.  Musculoskeletal: Palpable, firm area over the anterior, distal thigh.  No erythema or warmth.  No fluctuance.  No skin breakdown.  No erythema or swelling of the knee to suspect septic joint. No gross deformities of extremities. Neurologic:  Normal speech and language. No gross focal neurologic deficits are appreciated.  Skin:  Skin is warm, dry and intact. No rash noted.  ____________________________________________   LABS (all labs ordered are listed, but only abnormal results are displayed)  Labs Reviewed  CBC - Abnormal; Notable for the following components:      Result Value   WBC 12.0 (*)    RDW 17.7 (*)    All other components within normal limits  BASIC METABOLIC PANEL - Abnormal; Notable for the following components:   Glucose, Bld 159 (*)    Calcium 8.6 (*)    All other components within normal limits  CBG MONITORING, ED - Abnormal; Notable  for the following components:   Glucose-Capillary 146 (*)    All other components within normal limits  RESP PANEL BY RT-PCR (RSV, FLU A&B, COVID)  RVPGX2  URINALYSIS, ROUTINE W REFLEX MICROSCOPIC   ____________________________________________  EKG   EKG Interpretation  Date/Time:  Wednesday January 11 2023 14:51:15 EST Ventricular Rate:  102 PR Interval:  152 QRS Duration: 129 QT Interval:  388 QTC Calculation: 506 R  Axis:   -18 Text Interpretation: Sinus tachycardia Left bundle branch block Confirmed by Nanda Quinton [phone]) on 01/11/2023 3:43:00 PM        ____________________________________________  RADIOLOGY  DG Knee Complete 4 Views Left  Result Date: 01/11/2023 CLINICAL DATA:  Knee pain EXAM: LEFT KNEE - COMPLETE 4+ VIEW COMPARISON:  Radiograph 10/21/2022 FINDINGS: Partially visualized intramedullary nail fixation of the femur with distal interlocking screws. There is prominent heterotopic ossification formation adjacent to the femur at the level of the distal interlocking screws, new from prior exam. There is no evidence of acute fracture. There is tricompartment osteophyte formation with mild medial patellofemoral joint space narrowing. There is a small left knee joint effusion. Supra and infrapatellar enthesophytes. Extensive vascular calcifications. IMPRESSION: Prominent heterotopic ossification formation along the distal femoral diaphysis at the level of the distal interlocking screws of the femoral nail, new from prior exam. No evidence of acute fracture.  Small left knee joint effusion. Tricompartment osteoarthritis of the left knee worst in the medial and patellofemoral compartments. Electronically Signed   By: Maurine Simmering M.D.   On: 01/11/2023 17:46   CT Head Wo Contrast  Result Date: 01/11/2023 CLINICAL DATA:  Altered mental status, seizure EXAM: CT HEAD WITHOUT CONTRAST TECHNIQUE: Contiguous axial images were obtained from the base of the skull through the vertex without intravenous contrast. RADIATION DOSE REDUCTION: This exam was performed according to the departmental dose-optimization program which includes automated exposure control, adjustment of the mA and/or kV according to patient size and/or use of iterative reconstruction technique. COMPARISON:  CT brain, MR brain, 11/18/2022 FINDINGS: Brain: No evidence of acute infarction, hemorrhage, hydrocephalus, extra-axial collection or mass  lesion/mass effect. Periventricular and deep white matter hypodensity. Nonacute encephalomalacia of the left frontal vertex (series 2, image 21). 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 Vascular: No hyperdense vessel or unexpected calcification. Skull: Normal. Negative for fracture or focal lesion. Sinuses/Orbits: No acute finding. Other: None. IMPRESSION: 1. No acute intracranial pathology. 2. Small-vessel white matter disease and nonacute encephalomalacia of the left frontal vertex. 3. 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   On: 01/11/2023 16:27    ____________________________________________   PROCEDURES  Procedure(s) performed:   Procedures  None ____________________________________________   INITIAL IMPRESSION / ASSESSMENT AND PLAN / ED COURSE  Pertinent labs & imaging results that were available during my care of the patient were reviewed by me and considered in my medical decision making (see chart for details).   This patient is Presenting for Evaluation of seizure activity, which does require a range of treatment options, and is a complaint that involves a high risk of morbidity and mortality.  The Differential Diagnoses  includes but is not exclusive to alcohol, illicit or prescription medications, intracranial pathology such as stroke, intracerebral hemorrhage, fever or infectious causes including sepsis, hypoxemia, uremia, trauma, endocrine related disorders such as diabetes, hypoglycemia, thyroid-related diseases, etc.  I did obtain Additional Historical Information from daughter at bedside, as  the patient has dementia.   Clinical Laboratory Tests Ordered, included CBC with mild leukocytosis.  Blood glucose 146.   Radiologic Tests Ordered, included CT head. I independently interpreted the images and agree with radiology interpretation.    Cardiac Monitor Tracing which shows NSR.    Social Determinants of Health Risk patient is a non-smoker.   Consult complete with Neurology, Dr. Quinn Axe. Recommends going with Dr. Clydene Fake recommendation from note on 12/30/22 with plan to decreased Depakote dose to 250 mg daily down from BID dosing. Other medication options would likely be worse in terms of side effects in the patient's age group.   Discussed knee x-ray with Dr. Sammuel Hines. He can follow in office. Advises not to aggressively massage the calcified area on plain films. Discussed with daughter who will pass along to home PT. Other activity is ok.   Medical Decision Making: Summary:  Patient presents to the emergency department with breakthrough seizure.  She is not currently on antiepileptic medications due to increased sedation while taking Depakote.  Seizure was brief and she has returned to baseline.  Plan for screening blood work, CT imaging of the head, UA, and plan to discuss with neurology on-call regarding the best next agent to start for seizure. Daughter is open to re-starting medication.   Reevaluation with update and discussion with patient and daughter at bedside.  We discussed the imaging as well as neurology recommendation to go to the to 250 mg daily dose of Depakote.  She plans to restart this and follow with neurology in February as scheduled.  She also mentions the initial reason for coming to the doctor today which was apparent knee pain.  I do palpate a firm area over the distal anterior thigh that does not appear acutely infected.  The leg is not diffusely swollen to suspect DVT.  Plan for x-ray imaging and likely discharge after if no acute findings.  Considered admission but seizure was brief and patient has returned to her baseline mental status.  Patient's presentation is most consistent with acute presentation with potential threat to life or bodily function.   Disposition:  discharge  ____________________________________________  FINAL CLINICAL IMPRESSION(S) / ED DIAGNOSES  Final diagnoses:  Breakthrough seizure ([employer])  Acute pain of left knee     Note:  This document was prepared using Dragon voice recognition software and may include unintentional dictation errors.  Nanda Quinton, MD, [facility] Emergency Medicine    Reah Justo, [facility], MD 01/11/23 [phone]

## 2023-01-11 NOTE — Discharge Instructions (Signed)
You were seen in the emergency today with breakthrough seizure.  We discussed restarting your Depakote at 250 mg daily as opposed to twice daily.  Hopefully, this decreased dose will lead to less sedation.  Please follow closely with your neurologist.   The x-ray of the knee showed some calcium deposits in the muscle and near your hardware.  I spoke with Dr. Sammuel Hines regarding this x-ray and he reviewed the images.  Would not attempt to massage this out as it is a calcium deposit.  You can follow-up with him in the office and continue therapy at home.

## 2023-01-11 NOTE — Progress Notes (Signed)
Patient with concern for seizure to appointment.  Appointment deferred today.  Plan for referral to the emergency room

## 2023-01-11 NOTE — ED Notes (Signed)
Seizure pads placed on bed  Daughter at bed side

## 2023-01-11 NOTE — ED Notes (Signed)
Daughter gave verbal MSE has signature pad not working

## 2023-01-11 NOTE — ED Notes (Signed)
Called [facility] 

## 2023-01-12 ENCOUNTER — Telehealth: Payer: Self-pay

## 2023-01-12 NOTE — Telephone Encounter (Signed)
Christine Newton with [facility] called. He wants to know if it is okay to stretch left knee in PT. He said that patient was seen in the ED yesterday and had x-rays that Dr.Bokshan reviewed.  Call back [phone]

## 2023-01-13 NOTE — Telephone Encounter (Signed)
RC to [facility] and informed him of Dr. Eddie Dibbles response. No further questions asked

## 2023-01-17 ENCOUNTER — Telehealth: Payer: Self-pay

## 2023-01-17 NOTE — Telephone Encounter (Signed)
Patient needs to set up an appointment with Dr Tobie Poet. I left message on voicemail to call us back.

## 2023-01-24 ENCOUNTER — Telehealth: Payer: Self-pay

## 2023-01-24 ENCOUNTER — Encounter: Payer: Self-pay | Admitting: Family Medicine

## 2023-01-24 NOTE — Telephone Encounter (Signed)
Christine Newton from [facility]  stated patient no longer will get services due to her insurance did not approve it. Family has been made aware of this.

## 2023-01-31 ENCOUNTER — Emergency Department (HOSPITAL_COMMUNITY)
Admission: EM | Admit: 2023-01-31 | Discharge: 2023-02-01 | Disposition: A | Discharge status: Home / Self Care | Payer: Medicare PPO | Attending: Emergency Medicine | Admitting: Emergency Medicine

## 2023-01-31 ENCOUNTER — Telehealth (INDEPENDENT_AMBULATORY_CARE_PROVIDER_SITE_OTHER): Payer: Medicare PPO | Admitting: Family Medicine

## 2023-01-31 ENCOUNTER — Emergency Department (HOSPITAL_COMMUNITY): Payer: Medicare PPO

## 2023-01-31 ENCOUNTER — Encounter: Payer: Self-pay | Admitting: Neurology

## 2023-01-31 ENCOUNTER — Other Ambulatory Visit: Payer: Self-pay

## 2023-01-31 ENCOUNTER — Ambulatory Visit: Payer: Medicare PPO | Admitting: Neurology

## 2023-01-31 ENCOUNTER — Encounter (HOSPITAL_COMMUNITY): Payer: Self-pay

## 2023-01-31 VITALS — BP 75/55 | HR 90

## 2023-01-31 VITALS — BP 156/90 | HR 72 | Temp 98.6°F

## 2023-01-31 DIAGNOSIS — Z8673 Personal history of transient ischemic attack (TIA), and cerebral infarction without residual deficits: Secondary | ICD-10-CM | POA: Insufficient documentation

## 2023-01-31 DIAGNOSIS — I6523 Occlusion and stenosis of bilateral carotid arteries: Secondary | ICD-10-CM

## 2023-01-31 DIAGNOSIS — R29898 Other symptoms and signs involving the musculoskeletal system: Secondary | ICD-10-CM

## 2023-01-31 DIAGNOSIS — I10 Essential (primary) hypertension: Secondary | ICD-10-CM | POA: Diagnosis not present

## 2023-01-31 DIAGNOSIS — G40209 Localization-related (focal) (partial) symptomatic epilepsy and epileptic syndromes with complex partial seizures, not intractable, without status epilepticus: Secondary | ICD-10-CM | POA: Diagnosis not present

## 2023-01-31 DIAGNOSIS — I6529 Occlusion and stenosis of unspecified carotid artery: Secondary | ICD-10-CM | POA: Insufficient documentation

## 2023-01-31 DIAGNOSIS — F0390 Unspecified dementia without behavioral disturbance: Secondary | ICD-10-CM | POA: Insufficient documentation

## 2023-01-31 DIAGNOSIS — E782 Mixed hyperlipidemia: Secondary | ICD-10-CM | POA: Diagnosis not present

## 2023-01-31 DIAGNOSIS — R404 Transient alteration of awareness: Secondary | ICD-10-CM | POA: Diagnosis not present

## 2023-01-31 DIAGNOSIS — Z79899 Other long term (current) drug therapy: Secondary | ICD-10-CM | POA: Diagnosis not present

## 2023-01-31 DIAGNOSIS — S72142D Displaced intertrochanteric fracture of left femur, subsequent encounter for closed fracture with routine healing: Secondary | ICD-10-CM

## 2023-01-31 DIAGNOSIS — I471 Supraventricular tachycardia, unspecified: Secondary | ICD-10-CM

## 2023-01-31 DIAGNOSIS — R55 Syncope and collapse: Secondary | ICD-10-CM

## 2023-01-31 DIAGNOSIS — E119 Type 2 diabetes mellitus without complications: Secondary | ICD-10-CM

## 2023-01-31 DIAGNOSIS — R569 Unspecified convulsions: Secondary | ICD-10-CM

## 2023-01-31 DIAGNOSIS — Z952 Presence of prosthetic heart valve: Secondary | ICD-10-CM | POA: Diagnosis not present

## 2023-01-31 DIAGNOSIS — I5032 Chronic diastolic (congestive) heart failure: Secondary | ICD-10-CM

## 2023-01-31 DIAGNOSIS — I110 Hypertensive heart disease with heart failure: Secondary | ICD-10-CM

## 2023-01-31 DIAGNOSIS — R5381 Other malaise: Secondary | ICD-10-CM | POA: Insufficient documentation

## 2023-01-31 HISTORY — DX: Supraventricular tachycardia, unspecified: I47.10

## 2023-01-31 LAB — CBC WITH DIFFERENTIAL/PLATELET
Abs Immature Granulocytes: 0.67 10*3/uL — ABNORMAL HIGH (ref 0.00–0.07)
Basophils Absolute: 0.1 10*3/uL (ref 0.0–0.1)
Basophils Relative: 1 %
Eosinophils Absolute: 0.1 10*3/uL (ref 0.0–0.5)
Eosinophils Relative: 1 %
HCT: 37.1 % (ref 36.0–46.0)
Hemoglobin: 11.7 g/dL — ABNORMAL LOW (ref 12.0–15.0)
Immature Granulocytes: 5 %
Lymphocytes Relative: 24 %
Lymphs Abs: 3 10*3/uL (ref 0.7–4.0)
MCH: 28.8 pg (ref 26.0–34.0)
MCHC: 31.5 g/dL (ref 30.0–36.0)
MCV: 91.4 fL (ref 80.0–100.0)
Monocytes Absolute: 0.9 10*3/uL (ref 0.1–1.0)
Monocytes Relative: 8 %
Neutro Abs: 7.7 10*3/uL (ref 1.7–7.7)
Neutrophils Relative %: 61 %
Platelets: 272 10*3/uL (ref 150–400)
RBC: 4.06 MIL/uL (ref 3.87–5.11)
RDW: 17.1 % — ABNORMAL HIGH (ref 11.5–15.5)
WBC: 12.6 10*3/uL — ABNORMAL HIGH (ref 4.0–10.5)
nRBC: 0 % (ref 0.0–0.2)

## 2023-01-31 LAB — COMPREHENSIVE METABOLIC PANEL
ALT: 9 U/L (ref 0–44)
AST: 16 U/L (ref 15–41)
Albumin: 2.3 g/dL — ABNORMAL LOW (ref 3.5–5.0)
Alkaline Phosphatase: 64 U/L (ref 38–126)
Anion gap: 9 (ref 5–15)
BUN: 23 mg/dL (ref 8–23)
CO2: 26 mmol/L (ref 22–32)
Calcium: 8.3 mg/dL — ABNORMAL LOW (ref 8.9–10.3)
Chloride: 106 mmol/L (ref 98–111)
Creatinine, Ser: 0.74 mg/dL (ref 0.44–1.00)
GFR, Estimated: >60 mL/min (ref >60)
Glucose, Bld: 125 mg/dL — ABNORMAL HIGH (ref 70–99)
Potassium: 3.9 mmol/L (ref 3.5–5.1)
Sodium: 141 mmol/L (ref 135–145)
Total Bilirubin: 0.2 mg/dL — ABNORMAL LOW (ref 0.3–1.2)
Total Protein: 5.2 g/dL — ABNORMAL LOW (ref 6.5–8.1)

## 2023-01-31 LAB — CBG MONITORING, ED: Glucose-Capillary: 114 mg/dL — ABNORMAL HIGH (ref 70–99)

## 2023-01-31 MED ORDER — METOPROLOL TARTRATE 25 MG PO TABS: 12.5000 mg | ORAL_TABLET | Freq: Two times a day (BID) | ORAL | 0 refills | Status: DC

## 2023-01-31 MED ORDER — DIVALPROEX SODIUM 250 MG PO DR TAB
250.0000 mg | DELAYED_RELEASE_TABLET | Freq: Once | ORAL | Status: AC
  Administered 2023-01-31: 250 mg via ORAL
  Filled 2023-01-31: qty 1

## 2023-01-31 MED ORDER — SODIUM CHLORIDE 0.9 % IV SOLN: INTRAVENOUS | Status: DC

## 2023-01-31 MED ORDER — LISINOPRIL 10 MG PO TABS: 10.0000 mg | ORAL_TABLET | Freq: Every day | ORAL | 0 refills | Status: DC

## 2023-01-31 NOTE — ED Provider Notes (Signed)
[facility] Provider Note   CSN: [id number] Arrival date & time: 01/31/23  1700     History  Chief Complaint  Patient presents with   Near Syncope    Christine Newton is a 89 y.o. female.  HPI Presents from clinic via EMS after episode of syncope/minimal responsiveness.  Patient's is slow to speak, but answers questions appropriately.  She notes that she has had episodes of syncope, back sometime, today was at a visit for this evaluation.  Clinic notes note that the patient became unresponsive, staring into space.  She was brought here for evaluation.  EMS reports patient had CBG 146, hypotension on the scene, this latter improved with fluid resuscitation. Patient denies pain, dyspnea, focal weakness.     Home Medications Prior to Admission medications   Medication Sig Start Date End Date Taking? Authorizing Provider  aspirin EC 81 MG tablet Take 1 tablet (81 mg total) by mouth daily. Swallow whole. Patient taking differently: Take 81 mg by mouth daily. 01/21/21   Frann Rider, NP  atorvastatin (LIPITOR) 40 MG tablet TAKE 1 TABLET BY MOUTH EVERY DAY 12/02/22   Lillard Anes, MD  Carboxymethylcellulose Sodium (DRY EYE RELIEF OP) Place 1 drop into both eyes daily as needed (dry eyes).    [provider]  divalproex (DEPAKOTE) 250 MG DR tablet Take 1 tablet (250 mg total) by mouth 1 day or 1 dose for 90 doses. 12/30/22 03/30/23  Garvin Fila, MD  lisinopril (ZESTRIL) 10 MG tablet Take 1 tablet (10 mg total) by mouth daily. 01/31/23   CoxElnita Maxwell, MD  metoprolol tartrate (LOPRESSOR) 25 MG tablet Take 0.5 tablets (12.5 mg total) by mouth 2 (two) times daily. 01/31/23 05/01/23  Rochel Brome, MD      Allergies    Cephalosporins    Review of Systems   Review of Systems  All other systems reviewed and are negative.   Physical Exam Updated Vital Signs BP (!) 152/77   Pulse 88   Temp 97.6 F (36.4 C) (Oral)   Resp 20    Ht '5\' 6"'$  (1.676 m)   Wt 65.8 kg   SpO2 98%   BMI 23.40 kg/m  Physical Exam Vitals and nursing note reviewed.  Constitutional:      General: She is not in acute distress.    Appearance: She is well-developed. She is ill-appearing. She is not toxic-appearing or diaphoretic.  HENT:     Head: Normocephalic and atraumatic.  Eyes:     Conjunctiva/sclera: Conjunctivae normal.  Cardiovascular:     Rate and Rhythm: Normal rate and regular rhythm.  Pulmonary:     Effort: Pulmonary effort is normal. No respiratory distress.     Breath sounds: Normal breath sounds. No stridor.  Abdominal:     General: There is no distension.  Skin:    General: Skin is warm and dry.  Neurological:     Mental Status: She is alert and oriented to person, place, and time.     Cranial Nerves: No cranial nerve deficit.  Psychiatric:        Mood and Affect: Affect is flat.        Behavior: Behavior is slowed and withdrawn.     Comments: Patient is slow to answer questions, but seemingly answers them appropriately.     ED Results / Procedures / Treatments   Labs (all labs ordered are listed, but only abnormal results are displayed) Labs Reviewed  COMPREHENSIVE  METABOLIC PANEL - Abnormal; Notable for the following components:      Result Value   Glucose, Bld 125 (*)    Calcium 8.3 (*)    Total Protein 5.2 (*)    Albumin 2.3 (*)    Total Bilirubin 0.2 (*)    All other components within normal limits  CBC WITH DIFFERENTIAL/PLATELET - Abnormal; Notable for the following components:   WBC 12.6 (*)    Hemoglobin 11.7 (*)    RDW 17.1 (*)    Abs Immature Granulocytes 0.67 (*)    All other components within normal limits  CBG MONITORING, ED - Abnormal; Notable for the following components:   Glucose-Capillary 114 (*)    All other components within normal limits    EKG Sinus rhythm, rate76 intraventricular conduction delay, ST wave changes, abnormal ECG  Radiology CT Head Wo Contrast  Result Date:  01/31/2023 CLINICAL DATA:  Syncope. EXAM: CT HEAD WITHOUT CONTRAST TECHNIQUE: Contiguous axial images were obtained from the base of the skull through the vertex without intravenous contrast. RADIATION DOSE REDUCTION: This exam was performed according to the departmental dose-optimization program which includes automated exposure control, adjustment of the mA and/or kV according to patient size and/or use of iterative reconstruction technique. COMPARISON:  Head CT dated 01/11/2023. FINDINGS: Brain: Moderate age-related atrophy and chronic microvascular ischemic changes. Similar appearance of sellar mass better characterized on previous MRI of 11/18/2022. There is no acute intracranial hemorrhage. No mass effect midline shift. No extra-axial fluid collection. Vascular: No hyperdense vessel or unexpected calcification. Skull: Normal. Negative for fracture or focal lesion. Sinuses/Orbits: Mild mucoperiosteal thickening of paranasal sinuses with opacification of the sphenoid sinuses. No air-fluid level. The mastoid air cells are clear. Other: None IMPRESSION: 1. No acute intracranial pathology. 2. Moderate age-related atrophy and chronic microvascular ischemic changes. 3. Similar appearance of sellar mass better characterized on previous MRI of 11/18/2022. Electronically Signed   By: Anner Crete M.D.   On: 01/31/2023 19:10   DG Chest Port 1 View  Result Date: 01/31/2023 CLINICAL DATA:  Unresponsive. EXAM: PORTABLE CHEST 1 VIEW COMPARISON:  January 19, 2022 FINDINGS: The trachea is deviated to the right. Cardiomediastinal silhouette is normal. Valvular prosthesis is in stable position. Mediastinal contours appear intact. Low lung volumes with persistent opacity at the left lung base, suspicious for pleural effusion. Demineralization.  Soft tissues are grossly normal. IMPRESSION: Deviation of the trachea to the right which may be seen with the thyroid mass or mediastinal mass. Nonemergent cross-sectional imaging  may be considered. Low lung volumes with persistent opacity at the left lung base, suspicious for pleural effusion. Electronically Signed   By: Fidela Salisbury M.D.   On: 01/31/2023 17:52    Procedures Procedures    Medications Ordered in ED Medications  0.9 %  sodium chloride infusion ( Intravenous New Bag/Given 01/31/23 1807)    ED Course/ Medical Decision Making/ A&P                             Medical Decision Making Patient with multiple medical issues including possible new seizures versus syncope disorder presents from neurology clinic after episode of syncope or altered mental status.  Patient hypotensive in the field, but here she is normotensive, slow to respond, but oriented appropriately.  Differential including arrhythmia, electrolyte abnormalities, infection, seizure disorder all considered.  Patient started on continuous monitoring.   Cardiac 75 sinus normal Pulse ox 100% room air normal  Amount and/or Complexity of Data Reviewed Independent Historian: EMS External Data Reviewed: notes.    Details: Neurology note from earlier today describing the episode reviewed Labs: ordered. Decision-making details documented in ED Course. Radiology: ordered and independent interpretation performed. Decision-making details documented in ED Course. ECG/medicine tests: ordered and independent interpretation performed. Decision-making details documented in ED Course.  Risk Prescription drug management. Decision regarding hospitalization.   10:14 PM Patient resting, in no distress.  Daughter and I discussed today's evaluation, reviewed MRI images from November at bedside, discussed the neurology evaluation, and today's findings.  No evidence for new intracranial abnormality, labs reassuring, no evidence for infection.  Has had no arrhythmia on monitor here for several hours. Given these reassuring findings I discussed admission versus discharge with the patient's daughter who  notes the patient is more comfortable going home, she is comfortable herself following up as an outpatient.        Final Clinical Impression(s) / ED Diagnoses Final diagnoses:  Transient alteration of awareness     Carmin Muskrat, MD 01/31/23 2218

## 2023-01-31 NOTE — ED Triage Notes (Signed)
Pt to er via ems, per ems pt was at a neuro appointment and had an episode where her eyes were open, but she was minimally responsive, states that her cbg was 146, they placed an iv and gave 300 ml of ns for some hypotension.  Pt presents with eyes open, pt oriented times two, reoriented pt.

## 2023-01-31 NOTE — ED Notes (Signed)
Patient cleaned of bowel and bladder incontinence and peri care performed.

## 2023-01-31 NOTE — Assessment & Plan Note (Addendum)
Continue lipitor 40 mg before bed and aspirin 81 mg daily.  Further management per Dr. Leonie Man.

## 2023-01-31 NOTE — ED Notes (Addendum)
[facility] called by Diplomatic Services operational officer for patient to go back home.

## 2023-01-31 NOTE — Progress Notes (Signed)
Virtual Visit via Telephone Note   This visit type was conducted secondary to patient preference and due to great difficulty leavin her home. All issues noted in this document were discussed and addressed.  No physical exam could be performed with this format.  Patient verbally consented to a telehealth visit.   Date:  02/05/2023   ID:  Christine Newton, Christine Newton 05-19-1933, MRN [id number]  Patient Location: Home Provider Location: Office/Clinic  PCP:  Rochel Brome, MD   History of Present Illness:    Chief Complaint:  chronic followup  Christine Newton is a 89 y.o. female with history of syncope episodes and seizure-like episodes. Patient had closed left femoral fracture on October 01, 2022.  She was admitted and had a left hip cephalomedullary nail placement on October 8.  She was discharged to skilled nursing home with PT and OT recommended.  The patient states 29 days in the skilled nursing facility.  On the day she was discharged her daughter found her with bruising and contusions.  She did report this to the state.  Her daughter is concerned that she did not get the occupational and physical therapy that was appropriate at the skilled nursing facility.  She was discharged home unable to walk.  Due to recent episode on January 11, 2023 of possible seizures and her trip to the emergency department the home health care company has discontinued there therapy.  They would like to restart therapy.  Initially when they came home from the skilled nursing facility, she was receiving OT, PT, and ST.  Speech therapy worked on her swallowing and she was discharged from speech therapy.  Her daughter and her personal care assistant have been doing range of motion exercises with her legs.  Cardiac history: History of nonrheumatic aortic stenosis status post aortic valve replacement with #21 CE biologic with septal myectomy 2014. 11/14/2020: Echocardiogram: Normal LVEF 02/19/2021: Hospitalization for  syncopal episode-thought to be secondary to hypotension.  At that time discontinued amlodipine.  Continued ACE inhibitor.  There was concern for TIA.  Put on aspirin and Plavix.  Discontinue Plavix due to rectal bleeding. 02/19/2021: EEG question of underlying seizure activity at that time.  Put on Keppra which she did not tolerate.  08/2022 admission Echo 09/01/2022 revealed LVEF 55 to 60%, moderate LVH, grade 1 diastolic dysfunction, normal RV, normal aortic valve prosthesis, no significant change from prior study. 10/23/2022 normal sinus rhythm with IVCD and first-degree AV block, frequent WCT at rates > 150 bpm. Likely SVT with IVCD rather than VT. Daughter reports she has been out of metoprolol. It looks like perhaps the prescription for the metoprolol was printed rather than sent to the pharmacy.  Neurological history: MRI Brain (08/23/2022): Chronic ischemic infarcts of left frontal and parietal lobes and scattered remote lacunar infarcts of the left centrum semiovale I will, bilateral basal ganglia and cerebellar.  This also showed a pituitary macro adenoma that is unchanged from previous scan 2 years ago. CTA (9/62023) 40% stenosis of right ICA origin and approximately 50% stenosis of right vertebral artery. EEGs suggestive of focal left temporal slowing which may be postictal .  Patient has been treated for seizures in the past with Keppra but this caused confusion and lethargy. Developed black stools on Plavix and aspirin, so these were held.  Patient has had a syncopal episodes over the last 12 months.  Differential diagnosis included orthostatic hypotension, seizures, and TIAs.  She has had numerous EEGs some were normal and some  showed left temporal slowing.  Patient did not tolerate Keppra in the past.  She was started on Depakote 500 mg twice daily in November 2024.  This was very sedating.  This has been gradually getting decreased to 250 mg nightly.  Patient is set to follow-up with Dr.  Leonie Man today.  Christine Newton is diabetic.  She is currently on metformin.  Her sugars are always less than 100.  She has had some in the 43s.  Per the personal care assistant and her daughter at the time of the syncopal episodes her sugars have always been fine.   Other complaints: Left leg pain: A hard mass behind her leg.  This was felt to be a calcium deposit.  Patient has followed up with Dr. Nicki Guadalajara, who is her orthopedic surgeon.  Patient spirits have been good.  Her daughter denies she is having any depression. ADLs: Patient is unable to get out of the bed.    Past Medical History:  Diagnosis Date   Closed fracture of left inferior and superior pubic ramus ([place]) 01/19/2022   Diabetes ([place])    Hypertension    Nonrheumatic aortic (valve) stenosis 09/21/2020    Past Surgical History:  Procedure Laterality Date   CARDIAC VALVE REPLACEMENT     CHOLECYSTECTOMY     INTRAMEDULLARY (IM) NAIL INTERTROCHANTERIC Left 10/02/2022   Procedure: INTRAMEDULLARY (IM) NAIL INTERTROCHANTERIC;  Surgeon: Vanetta Mulders, MD;  Location: [facility];  Service: Orthopedics;  Laterality: Left;    History reviewed. No pertinent family history.  Social History   Socioeconomic History   Marital status: Widowed    Spouse name: Not on file   Number of children: 2   Years of education: 12 + 4   Highest education level: Not on file  Occupational History   Occupation: RETIRED SOCIAL WORKER  Tobacco Use   Smoking status: Never   Smokeless tobacco: Never  Vaping Use   Vaping Use: Never used  Substance and Sexual Activity   Alcohol use: Never   Drug use: Never   Sexual activity: Not Currently  Other Topics Concern   Not on file  Social History Narrative   Not on file   Social Determinants of Health   Financial Resource Strain: Not on file  Food Insecurity: Not on file  Transportation Needs: Not on file  Physical Activity: Not on file  Stress: Not on file  Social Connections: Not on file  Intimate  Partner Violence: Not on file    Outpatient Medications Prior to Visit  Medication Sig Dispense Refill   aspirin EC 81 MG tablet Take 1 tablet (81 mg total) by mouth daily. Swallow whole. (Patient taking differently: Take 81 mg by mouth daily.) 30 tablet 11   atorvastatin (LIPITOR) 40 MG tablet TAKE 1 TABLET BY MOUTH EVERY DAY 90 tablet 1   Carboxymethylcellulose Sodium (DRY EYE RELIEF OP) Place 1 drop into both eyes daily as needed (dry eyes).     divalproex (DEPAKOTE) 250 MG DR tablet Take 1 tablet (250 mg total) by mouth 1 day or 1 dose for 90 doses. 90 tablet 0   enoxaparin (LOVENOX) 40 MG/0.4ML injection Inject 0.4 mLs (40 mg total) into the skin daily. 0 mL    HYDROcodone-acetaminophen (NORCO/VICODIN) 5-325 MG tablet Take 1 tablet by mouth every 6 (six) hours as needed for moderate pain or severe pain. 10 tablet 0   metFORMIN (GLUCOPHAGE) 500 MG tablet TAKE 2 EVERY MORNING AND 1 EVERY EVENING 270 tablet 0   metoprolol tartrate (  LOPRESSOR) 25 MG tablet Take 0.5 tablets (12.5 mg total) by mouth 2 (two) times daily. 45 tablet 3   No facility-administered medications prior to visit.    Allergies:   Cephalosporins   Social History   Tobacco Use   Smoking status: Never   Smokeless tobacco: Never  Vaping Use   Vaping Use: Never used  Substance Use Topics   Alcohol use: Never   Drug use: Never     Review of Systems  Constitutional:  Negative for chills, diaphoresis, fever and malaise/fatigue.  HENT:  Negative for congestion, ear pain and sore throat.   Respiratory:  Negative for cough and sputum production.   Cardiovascular:  Negative for chest pain and palpitations.  Gastrointestinal:  Negative for abdominal pain, constipation, diarrhea, nausea and vomiting.  Musculoskeletal:  Positive for joint pain. Negative for myalgias.  Neurological:  Positive for seizures and weakness. Negative for dizziness and headaches.  Psychiatric/Behavioral:  Positive for memory loss (mild). Negative  for depression. The patient is not nervous/anxious.      Labs/Other Tests and Data Reviewed:    Recent Labs: 10/07/2022: Magnesium 1.9 01/31/2023: ALT 9; BUN 23; Creatinine, Ser 0.74; Hemoglobin 11.7; Platelets 272; Potassium 3.9; Sodium 141   Recent Lipid Panel Lab Results  Component Value Date/Time   CHOL 167 07/12/2022 03:20 PM   TRIG 99 07/12/2022 03:20 PM   HDL 48 07/12/2022 03:20 PM   CHOLHDL 3.5 07/12/2022 03:20 PM   CHOLHDL 4.1 12/14/2020 04:31 AM   LDLCALC 101 (H) 07/12/2022 03:20 PM    Wt Readings from Last 3 Encounters:  01/31/23 145 lb (65.8 kg)  01/11/23 145 lb (65.8 kg)  11/18/22 137 lb 9.1 oz (62.4 kg)     Objective:    Vital Signs:  BP (!) 156/90   Pulse 72   Temp 98.6 F (37 C)    Physical Exam Vitals reviewed.  Constitutional:      Comments: Laying in hospital bed.   Musculoskeletal:     Comments: Patient is able to raise both arms equally. Patient is unable to lift her legs off the bed more than approximately 4 inches.  She is unable to flex her legs on her own.  There do not appear to be any contractures as her daughter and her PCA move her through nearly a full range of motion of bilateral knees and hips.  Neurological:     Mental Status: She is alert.     Comments: Justeen answers my questions approach  Psychiatric:        Mood and Affect: Mood normal.        Behavior: Behavior normal.      ASSESSMENT & PLAN:    Carotid stenosis Continue lipitor 40 mg before bed and aspirin 81 mg daily.  Further management per Dr. Leonie Man.   Hypertensive heart disease with chronic diastolic congestive heart failure (HCC) Goal 140/80 per cardiology.  Decrease lisinopril to 10 mg daily  Restart metoprolol 25 mg 1/2 oral twice daily (more for SVT.)  Hyperlipidemia Continue lipitor 40 mg before bed.  Continue to work on eating a healthy diet and exercise.  Labs to be drawn at neurology   Non-insulin dependent type 2 diabetes mellitus ([place]) Stop metformin.   A1C goal 7.5-8.5. Sugars 70-100. I would prefer patient run high rather than have hypoglycemia.   Paroxysmal SVT (supraventricular tachycardia) Restart metoprolol tartrate 25 mg 1/2 oral twice daily.   S/P AVR (aortic valve replacement) Follows with cardiology.   Seizure-like activity (  [place]) I recommended Vickie and Genesi discuss with Dr. Leonie Man today.   Leg weakness Recommend physical therapy and occupational therapy ordered  Physical deconditioning Recommend physical therapy and occupational therapy ordered  Closed left femoral fracture ([place]) Has not recovered to baseline.  Patient is bedbound.  Recommend OCCUPATIONAL THERAPY/physical therapy return     Orders Placed This Encounter  Procedures   CBC with Differential/Platelet   Comprehensive metabolic panel   Hemoglobin A1c   Lipid panel   TSH   B12 and Folate Panel   Methylmalonic acid, serum   VITAMIN D 25 Hydroxy (Vit-D Deficiency, Fractures)   Ambulatory referral to [facility] ordered this encounter  Medications   lisinopril (ZESTRIL) 10 MG tablet    Sig: Take 1 tablet (10 mg total) by mouth daily.    Dispense:  90 tablet    Refill:  0   metoprolol tartrate (LOPRESSOR) 25 MG tablet    Sig: Take 0.5 tablets (12.5 mg total) by mouth 2 (two) times daily.    Dispense:  90 tablet    Refill:  0    I spent 60 minutes dedicated to the care of this patient on the date of this encounter in a face-to-face encounter via telehealth. I also spent a significant amount of time > 20 minutes doing a chart review.   Follow Up:  Virtual Visit  prn  Signed,  Rochel Brome, MD  02/05/2023 4:05 PM    [place]

## 2023-01-31 NOTE — Consult Note (Signed)
Neurology Consultation Reason for Consult: syncope vs. seizure Requesting Physician: Carmin Muskrat  CC: Syncope   History is obtained from: Daughter at bedside and chart review    HPI: Christine Newton is a 89 y.o. female with a past medical history significant for hypertension, diabetes, seizure versus syncope, strokes without obvious residual deficit, aortic valve replacement, SVT, dementia (at least some component of which is likely vascular).  At baseline she requires help with bathing, dressing, but was able to feed herself until hip replacement surgery on the left hip and October 2023.  Since then she has required help with eating as well.  Regarding her brief neurological history, she was seen in December 2021 after syncope with confusion and right-sided weakness noted to have a left sided chronic infarcts and focal left temporal slowing felt to be a seizure focus with postictal abnormality.  She was started on Keppra but this was discontinued due to lethargy/confusion.  She also did not tolerate dual antiplatelet therapy due to tarry stools but is currently on aspirin monotherapy.  Subsequently she had an episode of right hand weakness in July 2023.  Outpatient MRI approximately 1 month later demonstrated a subacute right corona radiata stroke (not felt to be related given location).  In November 2023 she had an episode of left gaze deviation with left upper extremity contraction followed by right gaze preference and left field cut after the event resolved at which time she was started on Depakote. She has also had multiple different events at home, daughter shows me a video of right arm shaking while the patient has a right gaze deviation on 1/24.  In this video the patient is able to repeat (echolalia) but not follow commands.   Regarding her most recent antiseizure medication Depakote, she was initially started at a dose of 500 twice daily which she took from late November through mid  December.  Due to severe somnolence on this medication and it was initially held for a week and then resumed at a lower dose of 250 mg twice daily.  She was still too somnolent on this dose and could not participate with physical therapy for her hip surgery.  Therefore she was further reduced to 250 mg nightly.  On this dose daughter reports she has not been having any focal neurological events concerning for seizure activity, and she is not too somnolent.  Daughter also notes that the patient's nighttime awakenings have improved with giving Depakote nightly.  Daughter has no concerns about agitation/behavior issues for the patient.  Transportation has also been an issue since her mother's hip surgery due to mobility.  In virtual visit with family medicine provider today, plan was made to restart patient's metoprolol 12.5 mg twice daily  Today she was following up with her outpatient neurologist Dr. Leonie Man.  In the waiting room her blood pressure was fine with systolics in the 301S.  However when she was brought back for formal vitals check she began to slump and her blood pressure dropped to 75/51.  He was given 300 mL of normal saline but had prolonged confusion and therefore it was recommended she go to the ED for further evaluation.  Daughter feels that this episode was secondary to the patient being anxious, as a very similar episode happened at a prior in person appointment.  There were no focal deficits with this event  At the time of my evaluation, and daughter reports the patient is back to her neurological baseline, and she notes that  the patient does not do well with hospitalizations and she would strongly prefer the patient not be hospitalized  Spell # 1 - Semiology: Loss of consciousness without shaking or focal deficits  - Prodome: Slow to respond - Post-spell: Confused for 15-20 minutes  - Triggers: Stress such as in person doctor's visits  - Frequency: 2 events recently    Spell # 2 -  Semiology: right arm shaking, right gaze deviation - Prodome: unclear - Post-spell: weak on the right  - Triggers: unclear  - Frequency: None since depakote reduced to 250 mg QHS  Spell # 3 - Semiology: left arm shaking, left gaze deviation - Prodome: unclear - Post-spell: unable to see on the left side, gaze to the right - Triggers: unclear  - Frequency: None since depakote reduced to 250 mg QHS    ROS: Unable to obtain due to altered mental status.   Past Medical History:  Diagnosis Date   Diabetes ([place])    Hypertension    Nonrheumatic aortic (valve) stenosis 09/21/2020   Past Surgical History:  Procedure Laterality Date   CARDIAC VALVE REPLACEMENT     CHOLECYSTECTOMY     INTRAMEDULLARY (IM) NAIL INTERTROCHANTERIC Left 10/02/2022   Procedure: INTRAMEDULLARY (IM) NAIL INTERTROCHANTERIC;  Surgeon: Vanetta Mulders, MD;  Location: [facility];  Service: Orthopedics;  Laterality: Left;   Current Outpatient Medications  Medication Instructions   aspirin EC 81 mg, Oral, Daily, Swallow whole.   atorvastatin (LIPITOR) 40 mg, Oral, Daily   Carboxymethylcellulose Sodium (DRY EYE RELIEF OP) 1 drop, Both Eyes, Daily PRN   divalproex (DEPAKOTE) 250 mg, Oral, 1 Day/Dose   lisinopril (ZESTRIL) 10 mg, Oral, Daily   metoprolol tartrate (LOPRESSOR) 12.5 mg, Oral, 2 times daily   History reviewed. No pertinent family history.  Social History:  reports that she has never smoked. She has never used smokeless tobacco. She reports that she does not drink alcohol and does not use drugs.  Exam: Current vital signs: BP (!) 153/81   Pulse 87   Temp 97.6 F (36.4 C) (Oral)   Resp 18   Ht '5\' 6"'$  (1.676 m)   Wt 65.8 kg   SpO2 98%   BMI 23.40 kg/m  Vital signs in last 24 hours: Temp:  [97.5 F (36.4 C)-97.6 F (36.4 C)] 97.6 F (36.4 C) (02/06 2122) Pulse Rate:  [73-94] 87 (02/06 2215) Resp:  [17-20] 18 (02/06 2215) BP: (75-168)/(53-94) 153/81 (02/06 2215) SpO2:  [96 %-100 %] 98 % (02/06  2215) Weight:  [65.8 kg] 65.8 kg (02/06 1711)   Physical Exam  Constitutional: Appears well-developed and well-nourished.  Psych: Affect appropriate to situation, pleasant and cooperative Eyes: No scleral injection HENT: No oropharyngeal obstruction.  MSK: no major joint deformities.  Cardiovascular: Normal rate and regular rhythm on monitor Respiratory: Effort normal, non-labored breathing Skin: Warm dry and intact visible skin  Neuro: Mental Status: Patient is awake, alert, does not participate much in history taking but does state a few sentences Cranial Nerves: II: Pupils are equal, round, and reactive to light.   III,IV, VI: Tracks examiner bilaterally V: Facial sensation is symmetric to light eyelash brush VII: Facial movement is symmetric.  VIII: hearing is intact to voice Motor: Using all 4 extremities grossly equally   I have reviewed labs in epic and the results pertinent to this consultation are:  Basic Metabolic Panel: Recent Labs  Lab 01/31/23 1727  NA 141  K 3.9  CL 106  CO2 26  GLUCOSE 125*  BUN  23  CREATININE 0.74  CALCIUM 8.3*    CBC: Recent Labs  Lab 01/31/23 1727  WBC 12.6*  NEUTROABS 7.7  HGB 11.7*  HCT 37.1  MCV 91.4  PLT 272   Mild leukocytosis is chronic  Coagulation Studies: No results for input(s): "LABPROT", "INR" in the last 72 hours.    I have reviewed the images obtained:  Head CT  1. No acute intracranial pathology. 2. Moderate age-related atrophy and chronic microvascular ischemic changes. 3. Similar appearance of sellar mass better characterized on previous MRI of 11/18/2022.  Impression: An extended review of patient's neurological history and different spells, I believe the patient is having complex partial seizures from bilateral seizure foci due to chronic strokes/atrophy, as well as some nonneurological spells potentially related to cardiac issues.  Spell today does not seem neurological in nature.  She is  tolerating 250 mg of Depakote nightly very well, able to participate well with physical therapy and in family activities on this dose, with the added bonus of improving her sleep-wake cycle.  Additionally daughter reports none of the shaking spells on this medication, which have recurred when antiseizure medications have been held.  This further suggests that the shaking spells are seizure activity.  Extended discussion with daughter at bedside to document different spell types to continue to guide outpatient management.  Given patient is at her neurological baseline, she does not need any further inpatient neurological workup but I appreciate cardiac and medical clearance per ED team  Recommendations: -Resume Depakote 250 nightly -No need to repeat EEG or MRI at this time -Close outpatient follow-up with Dr. Leonie Man -Inpatient neurology will be available as needed, please reach out if additional questions or concerns arise  [place] [phone]

## 2023-01-31 NOTE — Discharge Instructions (Signed)
Please be sure to monitor your condition carefully and follow-up with your physician.  Return here for concerning changes in your condition.

## 2023-01-31 NOTE — Assessment & Plan Note (Addendum)
Continue lipitor 40 mg before bed.  Continue to work on eating a healthy diet and exercise.  Labs to be drawn at neurology

## 2023-01-31 NOTE — Assessment & Plan Note (Signed)
Stop metformin.  A1C goal 7.5-8.5. Sugars 70-100. I would prefer patient run high rather than have hypoglycemia.

## 2023-01-31 NOTE — Assessment & Plan Note (Signed)
Restart metoprolol tartrate 25 mg 1/2 oral twice daily.

## 2023-01-31 NOTE — Assessment & Plan Note (Signed)
Follows with cardiology 

## 2023-01-31 NOTE — Assessment & Plan Note (Signed)
Goal 140/80 per cardiology.  Decrease lisinopril to 10 mg daily  Restart metoprolol 25 mg 1/2 oral twice daily (more for SVT.)

## 2023-01-31 NOTE — Progress Notes (Signed)
[facility] [address] [place]. [place] [address] [phone]       HOSPITAL FOLLOW UP NOTE  [name] Date of Birth:  [birth year] Medical Record Number:  [id number]   Reason for Referral:  hospital stroke follow up    SUBJECTIVE:   CHIEF COMPLAINT:  No chief complaint on file.   HPI:   Ms. Christine Newton is a 89 y.o. female with history of HTN and DM who presented to [facility] ED on 12/13/2020 after syncopal event followed by confusion, disorientation and transient right-sided weakness.  Personally reviewed hospitalization pertinent progress notes, lab work and imaging with summary provided.  Stroke work-up largely unremarkable for acute stroke.  MRI did show evidence of chronic ischemic infarcts involving the left frontal and parietal lobes and scattered remote lacunar infarcts of the left centrum semiovale, bilateral basal ganglia and cerebellum.  MRA showed 40% stenosis of right ICA origin and approximately 50% stenosis of right VA.  EEG suggestive of focal left temporal slowing which may be postictal in setting of recent event and recommended initiating Keppra XR 500 mg daily for seizure prevention.  Stroke risk factors include prior strokes on imaging, HTN, HLD and DM.  LDL 127 and increase atorvastatin from 10 mg to 20 mg daily.  A1c 6.8.  Evaluated by therapies and discharged home in stable condition without therapy needs.  Christine Newton is being seen today for hospital follow up accompanied by her daughter.  Christine Newton has been doing well since discharge without new or reoccurring stroke/TIA symptoms.  Christine Newton had great difficulty tolerating Keppra due to confusion and lethargy therefore discontinued by PCP and now currently back to baseline.  Christine Newton has not had any additional seizure type activity.  Christine Newton also experienced black tarry stools after taking Plavix and aspirin for 3 weeks therefore both discontinued by PCP and he has not had any additional black tarry stools  or any other signs or symptoms of bleeding.  Christine Newton has remained on atorvastatin 20 mg daily without myalgias.  Blood pressure today 118/71.  Routinely monitors at home and typically 120s/70s.  Glucose levels monitored at home and typically 90s.   Update 08/31/2022 : Christine Newton returns for follow-up after last visit with [place] practitioner in January 2022.  Christine Newton is accompanied by her daughter and her caregiver.  They feel Christine Newton had a TIA or a stroke on 07/20/2022.  Patient was standing in the kitchen and then all of a sudden he dropped the right hand which became flaccid.  He needed to be helped down.  He is menstruating and not the caregiver because the daughter called 911.  Ambulance arrived Christine Newton slowly started improving.  Christine Newton remains very short time.  Recovered in about 20 minutes patient is not want to go to the heart discussed with the primary physician who ordered an outpatient MRI scan of the brain which was done on 8 /24/23 which I personally reviewed shows tiny subacute right corona radiata lacunar infarct..  Stable appearance of the 1.5 cm x 1.6 cm x 1.8 cm pituitary macroadenoma.  LDL cholesterol was 101 and hemoglobin A1c 7.0 on 07/07/2022.  Patient has a 2D echo which is pending.  Patient has not had several other episodes since then which the caregiver describes as brief episodes of unresponsiveness when Christine Newton appears sleepy  weak all over.  Christine Newton needs help. to get up and slight And Christine Newton recovered within few minutes.   Such  episodes within the last 1 week itself have  occurred.  There are no obvious triggers for these episodes.  Patient does appear to confused disoriented and sleepy.  There is no weakness s tonic-clonic activity, tongue bite or incontinence.  Patient had a somewhat similar episode EEG at that time and showed left temporal slowing.  MRI scan of the brain at that time had shown age-related atrophy without acute abnormalities.  Scattered remote lacunar infarcts in the left centrum semiovale,  bilateral basal ganglia and cerebellum are noted.  Pituitary macroadenoma was also noted.  I had started the patient on Keppra but Christine Newton did not like it due to side effects and discontinued it.  On inquiry the daughter admits to having noticed some memory difficulties weekly short-term with the patient.  Christine Newton does not think this is necessarily progressing patient lives with her daughter but during the daytime Christine Newton has a caregiver.   Update 01/31/2023 : I was called in the field as patient by my nurse as Christine Newton was unresponsive in the room.  The daughter was present throughout the visit and informed me that the patient started becoming sluggish and less responsive while waiting in the waiting room for 30 minutes prior to coming to the exam room.  He was found to have very feeble pulse with blood pressure 75 systolic with systolic.  He was laid flat having much possible in the wheelchair with her feet elevated onto a stool.  Christine Newton gradually started improving in 5 to 10 minutes and became more responsive hypotensive.  EMS were called and arrived and spoke to the daughter.  The daughter was initially reluctant to take her to the ER as Christine Newton had a similar episode at last visit with me in September 2023.  And spent several days in the hospital with essentially negative workup for syncope and seizure.  Placed on Depakote 250 mg twice daily but Christine Newton became very sleepy the dose was reduced to 250 daily which you are currently taking.  In the past we have tried Keppra but Christine Newton had some side effects with that as well and it was discontinued.  ROS:   14 system review of systems performed and negative with exception of those listed in HPI  PMH:  Past Medical History:  Diagnosis Date   Diabetes ([place])    Hypertension    Nonrheumatic aortic (valve) stenosis 09/21/2020    PSH:  Past Surgical History:  Procedure Laterality Date   CARDIAC VALVE REPLACEMENT     CHOLECYSTECTOMY     INTRAMEDULLARY (IM) NAIL INTERTROCHANTERIC Left  10/02/2022   Procedure: INTRAMEDULLARY (IM) NAIL INTERTROCHANTERIC;  Surgeon: Vanetta Mulders, MD;  Location: [place];  Service: Orthopedics;  Laterality: Left;    Social History:  Social History   Socioeconomic History   Marital status: Widowed    Spouse name: Not on file   Number of children: 2   Years of education: 12 + 4   Highest education level: Not on file  Occupational History   Occupation: RETIRED SOCIAL WORKER  Tobacco Use   Smoking status: Never   Smokeless tobacco: Never  Vaping Use   Vaping Use: Never used  Substance and Sexual Activity   Alcohol use: Never   Drug use: Never   Sexual activity: Not Currently  Other Topics Concern   Not on file  Social History Narrative   Not on file   Social Determinants of Health   Financial Resource Strain: Not on file  Food Insecurity: Not on file  Transportation Needs: Not on file  Physical Activity:  Not on file  Stress: Not on file  Social Connections: Not on file  Intimate Partner Violence: Not on file    Family History: History reviewed. No pertinent family history.  Medications:   Current Outpatient Medications on File Prior to Visit  Medication Sig Dispense Refill   aspirin EC 81 MG tablet Take 1 tablet (81 mg total) by mouth daily. Swallow whole. (Patient taking differently: Take 81 mg by mouth daily.) 30 tablet 11   atorvastatin (LIPITOR) 40 MG tablet TAKE 1 TABLET BY MOUTH EVERY DAY 90 tablet 1   Carboxymethylcellulose Sodium (DRY EYE RELIEF OP) Place 1 drop into both eyes daily as needed (dry eyes).     divalproex (DEPAKOTE) 250 MG DR tablet Take 1 tablet (250 mg total) by mouth 1 day or 1 dose for 90 doses. 90 tablet 0   No current facility-administered medications on file prior to visit.    Allergies:   Allergies  Allergen Reactions   Cephalosporins Nausea Only      OBJECTIVE:  Physical Exam  Vitals:   01/31/23 1558 01/31/23 1606  BP: (!) 89/53 (!) 75/55  Pulse: 94 90   There is no height  or weight on file to calculate BMI. No results found.  General: Frail malnourished looking elderly   African-American female, seated, in no evident distress Head: head normocephalic and atraumatic.   Neck: supple with no carotid or supraclavicular bruits Cardiovascular: regular rate and rhythm, no murmurs Musculoskeletal: no deformity Skin:  no rash/petichiae Vascular:  Normal pulses all extremities   Neurologic Exam Mental Status: Drowsy lethargic barely opens eyes.  Speaks few words and follows commands. Cranial Nerves: Fundoscopic exam not done s. Pupils equal, briskly reactive to light. Extraocular movements full without nystagmus. Visual fields blinks to threat bilaterally. Hearing mildly diminished bilaterally facial sensation intact. Face, tongue, palate moves normally and symmetrically.  Motor: Withdraws all 4 extremities but poor effort with testing.   Sensory.: intact to touch , pinprick , position and vibratory sensation.  Coordination: Rapid alternating movements normal in all extremities. Finger-to-nose and heel-to-shin performed accurately bilaterally. Gait and Station: Deferred as patient is in a wheelchair. Reflexes: 1+ and symmetric. Toes downgoing.           ASSESSMENT: Christine Newton is a 89 y.o. year old female presented on 12/13/2020 after syncopal event followed by confusion, disorientation and transient right-sided weakness  and recurrent one in July 2023 with MRI done 1 month later showing a subacute right corona radiata lacunar stroke which was clinically silent.  Christine Newton continues to have recurrent episodes of transient consciousness which are of unclear etiology possible complex partial seizures versus vertebrobasilar TIA syncopal episodes which appear to have shown increased frequency recently.  History of abnormal EEG in the past and was tried on Keppra but was unable to tolerate it. History of pituitary macroadenoma since 2021 which has remained  stable. Episode of syncope during my office visit today prompting emergent transfer to ER for further evaluation   PLAN:  I had a long discussion with the patient, her daughter and caregiver regarding her recurrent episodes of brief altered consciousness and discussed differential diagnosis which includes complex partial seizures versus TIAs with syncopal episodes.   Patient unfortunately had an episode of brief unresponsiveness with hypotension likely of syncopal episode.  Christine Newton improved partially in the legs elevated.  EMS was called the daughter initially was reluctant to take her to the emergency room as he had an episode at last visit and  extensive workup in the hospital without a definitive diagnosis.  Christine Newton however relented later as patient did not quickly returned back to baseline.  Patient had reduced the dose of Depakote to 250 mg once a day we stopped this and may consider alternative anticonvulsant unless obvious cardiac etiology is identified testing in the present hospitalization Patient primary physician Dr. Tobie Poet had earlier sent me an epic message and wanted some lab work to be checked during this visit cannot be checked while Christine Newton is in the ER or hospitalized.  Greater than 50% time during this 40-minute prolonged visit was spent on counseling and coordination of care about the history of strokes, TIAs, seizures versus syncopal episodes and discussion about evaluation and treatment and answering questions.    Antony Contras, MD [facility] [address] [place], [place] [address]  Phone [phone] Fax [phone] Note: This document was prepared with digital dictation and possible smart phrase technology. Any transcriptional errors that result from this process are unintentional.

## 2023-01-31 NOTE — Assessment & Plan Note (Addendum)
I recommended Vickie and Taiana discuss with Dr. Leonie Man today.

## 2023-02-01 NOTE — ED Notes (Signed)
PTAR at bedside 

## 2023-02-02 ENCOUNTER — Encounter: Payer: Self-pay | Admitting: Family Medicine

## 2023-02-05 ENCOUNTER — Encounter: Payer: Self-pay | Admitting: Family Medicine

## 2023-02-05 NOTE — Assessment & Plan Note (Signed)
Has not recovered to baseline.  Patient is bedbound.  Recommend OCCUPATIONAL THERAPY/physical therapy return

## 2023-02-05 NOTE — Assessment & Plan Note (Signed)
Recommend physical therapy and occupational therapy ordered

## 2023-02-10 ENCOUNTER — Telehealth: Payer: Self-pay

## 2023-02-10 NOTE — Telephone Encounter (Signed)
        Patient  visited [facility] on 2/7     Telephone encounter attempt :  1st  A HIPAA compliant voice message was left requesting a return call.  Instructed patient to call back  Instructed patient to call back at [phone]  at their earliest convenience.

## 2023-02-13 ENCOUNTER — Telehealth: Payer: Self-pay

## 2023-02-13 ENCOUNTER — Ambulatory Visit: Payer: Medicare PPO | Admitting: Neurology

## 2023-02-13 NOTE — Telephone Encounter (Signed)
        Patient  visited [facility] on 2/7     Telephone encounter attempt :  2nd  A HIPAA compliant voice message was left requesting a return call.  Instructed patient to call back    [place] [phone] [address], [place], [place] [address] Phone: [phone] Email: [email or web address]

## 2023-02-28 ENCOUNTER — Encounter: Payer: Self-pay | Admitting: Family Medicine

## 2023-03-02 ENCOUNTER — Encounter: Payer: Self-pay | Admitting: Radiology

## 2023-03-20 DIAGNOSIS — I440 Atrioventricular block, first degree: Secondary | ICD-10-CM

## 2023-03-20 DIAGNOSIS — I5032 Chronic diastolic (congestive) heart failure: Secondary | ICD-10-CM | POA: Diagnosis not present

## 2023-03-20 DIAGNOSIS — I471 Supraventricular tachycardia, unspecified: Secondary | ICD-10-CM

## 2023-03-20 DIAGNOSIS — E119 Type 2 diabetes mellitus without complications: Secondary | ICD-10-CM

## 2023-03-20 DIAGNOSIS — S32592D Other specified fracture of left pubis, subsequent encounter for fracture with routine healing: Secondary | ICD-10-CM | POA: Diagnosis not present

## 2023-03-20 DIAGNOSIS — I110 Hypertensive heart disease with heart failure: Secondary | ICD-10-CM | POA: Diagnosis not present

## 2023-03-20 DIAGNOSIS — S72002D Fracture of unspecified part of neck of left femur, subsequent encounter for closed fracture with routine healing: Secondary | ICD-10-CM | POA: Diagnosis not present

## 2023-03-20 DIAGNOSIS — E782 Mixed hyperlipidemia: Secondary | ICD-10-CM

## 2023-04-21 ENCOUNTER — Encounter: Payer: Self-pay | Admitting: Family Medicine

## 2023-04-23 ENCOUNTER — Other Ambulatory Visit: Payer: Self-pay | Admitting: Family Medicine

## 2023-04-23 DIAGNOSIS — R569 Unspecified convulsions: Secondary | ICD-10-CM

## 2023-04-26 ENCOUNTER — Other Ambulatory Visit: Payer: Self-pay | Admitting: Family Medicine

## 2023-04-26 DIAGNOSIS — I471 Supraventricular tachycardia, unspecified: Secondary | ICD-10-CM

## 2023-04-26 NOTE — Telephone Encounter (Signed)
[facility] health, they stated that would require a new referral and orders sent over to re-new for patient and for them to be able to go out to see the patient.

## 2023-05-02 ENCOUNTER — Other Ambulatory Visit: Payer: Self-pay | Admitting: Family Medicine

## 2023-05-02 DIAGNOSIS — I110 Hypertensive heart disease with heart failure: Secondary | ICD-10-CM

## 2023-05-11 ENCOUNTER — Telehealth: Payer: Self-pay

## 2023-05-11 NOTE — Telephone Encounter (Signed)
Christine Newton called from [facility] and stated that they denied the referral sent over due to staffing issues.

## 2023-05-20 DIAGNOSIS — I5032 Chronic diastolic (congestive) heart failure: Secondary | ICD-10-CM | POA: Diagnosis not present

## 2023-05-20 DIAGNOSIS — E119 Type 2 diabetes mellitus without complications: Secondary | ICD-10-CM | POA: Diagnosis not present

## 2023-05-20 DIAGNOSIS — I6523 Occlusion and stenosis of bilateral carotid arteries: Secondary | ICD-10-CM | POA: Diagnosis not present

## 2023-05-20 DIAGNOSIS — I350 Nonrheumatic aortic (valve) stenosis: Secondary | ICD-10-CM | POA: Diagnosis not present

## 2023-05-20 DIAGNOSIS — S72142D Displaced intertrochanteric fracture of left femur, subsequent encounter for closed fracture with routine healing: Secondary | ICD-10-CM | POA: Diagnosis not present

## 2023-05-20 DIAGNOSIS — L89152 Pressure ulcer of sacral region, stage 2: Secondary | ICD-10-CM | POA: Diagnosis not present

## 2023-05-20 DIAGNOSIS — I110 Hypertensive heart disease with heart failure: Secondary | ICD-10-CM | POA: Diagnosis not present

## 2023-05-20 DIAGNOSIS — E782 Mixed hyperlipidemia: Secondary | ICD-10-CM | POA: Diagnosis not present

## 2023-05-20 DIAGNOSIS — R29898 Other symptoms and signs involving the musculoskeletal system: Secondary | ICD-10-CM | POA: Diagnosis not present

## 2023-05-23 ENCOUNTER — Telehealth: Payer: Self-pay

## 2023-05-23 NOTE — Telephone Encounter (Signed)
Gretchen from [facility] PT called and requested orders to go see patient once weekly for 2 weeks then twice weekly for two weeks then once weekly for 2 weeks. She is also requesting order for social worker to do evaluation for patient to get some extra help at home for the patient. They will fax over orders to be signed.

## 2023-05-24 DIAGNOSIS — R29898 Other symptoms and signs involving the musculoskeletal system: Secondary | ICD-10-CM | POA: Diagnosis not present

## 2023-05-24 DIAGNOSIS — E119 Type 2 diabetes mellitus without complications: Secondary | ICD-10-CM | POA: Diagnosis not present

## 2023-05-24 DIAGNOSIS — L89152 Pressure ulcer of sacral region, stage 2: Secondary | ICD-10-CM | POA: Diagnosis not present

## 2023-05-24 DIAGNOSIS — S72142D Displaced intertrochanteric fracture of left femur, subsequent encounter for closed fracture with routine healing: Secondary | ICD-10-CM | POA: Diagnosis not present

## 2023-05-24 DIAGNOSIS — I5032 Chronic diastolic (congestive) heart failure: Secondary | ICD-10-CM | POA: Diagnosis not present

## 2023-05-24 DIAGNOSIS — I6523 Occlusion and stenosis of bilateral carotid arteries: Secondary | ICD-10-CM | POA: Diagnosis not present

## 2023-05-24 DIAGNOSIS — E782 Mixed hyperlipidemia: Secondary | ICD-10-CM | POA: Diagnosis not present

## 2023-05-24 DIAGNOSIS — I110 Hypertensive heart disease with heart failure: Secondary | ICD-10-CM | POA: Diagnosis not present

## 2023-05-24 DIAGNOSIS — I350 Nonrheumatic aortic (valve) stenosis: Secondary | ICD-10-CM | POA: Diagnosis not present

## 2023-05-25 ENCOUNTER — Other Ambulatory Visit: Payer: Self-pay | Admitting: Family Medicine

## 2023-05-25 ENCOUNTER — Encounter: Payer: Self-pay | Admitting: Family Medicine

## 2023-05-25 DIAGNOSIS — L89152 Pressure ulcer of sacral region, stage 2: Secondary | ICD-10-CM | POA: Diagnosis not present

## 2023-05-25 DIAGNOSIS — I5032 Chronic diastolic (congestive) heart failure: Secondary | ICD-10-CM | POA: Diagnosis not present

## 2023-05-25 DIAGNOSIS — S72142D Displaced intertrochanteric fracture of left femur, subsequent encounter for closed fracture with routine healing: Secondary | ICD-10-CM | POA: Diagnosis not present

## 2023-05-25 DIAGNOSIS — I6523 Occlusion and stenosis of bilateral carotid arteries: Secondary | ICD-10-CM | POA: Diagnosis not present

## 2023-05-25 DIAGNOSIS — E119 Type 2 diabetes mellitus without complications: Secondary | ICD-10-CM | POA: Diagnosis not present

## 2023-05-25 DIAGNOSIS — R29898 Other symptoms and signs involving the musculoskeletal system: Secondary | ICD-10-CM | POA: Diagnosis not present

## 2023-05-25 DIAGNOSIS — E782 Mixed hyperlipidemia: Secondary | ICD-10-CM | POA: Diagnosis not present

## 2023-05-25 DIAGNOSIS — I110 Hypertensive heart disease with heart failure: Secondary | ICD-10-CM | POA: Diagnosis not present

## 2023-05-25 DIAGNOSIS — I350 Nonrheumatic aortic (valve) stenosis: Secondary | ICD-10-CM | POA: Diagnosis not present

## 2023-05-25 MED ORDER — ERYTHROMYCIN 5 MG/GM OP OINT: 1.0000 | TOPICAL_OINTMENT | Freq: Four times a day (QID) | OPHTHALMIC | 0 refills | Status: AC

## 2023-05-26 ENCOUNTER — Telehealth: Payer: Self-pay | Admitting: Family Medicine

## 2023-05-26 NOTE — Telephone Encounter (Signed)
ENHABIT HOME HEALTH ADD ON EVAL

## 2023-05-29 DIAGNOSIS — I6523 Occlusion and stenosis of bilateral carotid arteries: Secondary | ICD-10-CM | POA: Diagnosis not present

## 2023-05-29 DIAGNOSIS — R29898 Other symptoms and signs involving the musculoskeletal system: Secondary | ICD-10-CM | POA: Diagnosis not present

## 2023-05-29 DIAGNOSIS — E119 Type 2 diabetes mellitus without complications: Secondary | ICD-10-CM | POA: Diagnosis not present

## 2023-05-29 DIAGNOSIS — I110 Hypertensive heart disease with heart failure: Secondary | ICD-10-CM | POA: Diagnosis not present

## 2023-05-29 DIAGNOSIS — I350 Nonrheumatic aortic (valve) stenosis: Secondary | ICD-10-CM | POA: Diagnosis not present

## 2023-05-29 DIAGNOSIS — I5032 Chronic diastolic (congestive) heart failure: Secondary | ICD-10-CM | POA: Diagnosis not present

## 2023-05-29 DIAGNOSIS — L89152 Pressure ulcer of sacral region, stage 2: Secondary | ICD-10-CM | POA: Diagnosis not present

## 2023-05-29 DIAGNOSIS — S72142D Displaced intertrochanteric fracture of left femur, subsequent encounter for closed fracture with routine healing: Secondary | ICD-10-CM | POA: Diagnosis not present

## 2023-05-29 DIAGNOSIS — E782 Mixed hyperlipidemia: Secondary | ICD-10-CM | POA: Diagnosis not present

## 2023-05-31 DIAGNOSIS — I5032 Chronic diastolic (congestive) heart failure: Secondary | ICD-10-CM | POA: Diagnosis not present

## 2023-05-31 DIAGNOSIS — E782 Mixed hyperlipidemia: Secondary | ICD-10-CM | POA: Diagnosis not present

## 2023-05-31 DIAGNOSIS — R29898 Other symptoms and signs involving the musculoskeletal system: Secondary | ICD-10-CM | POA: Diagnosis not present

## 2023-05-31 DIAGNOSIS — I350 Nonrheumatic aortic (valve) stenosis: Secondary | ICD-10-CM | POA: Diagnosis not present

## 2023-05-31 DIAGNOSIS — I110 Hypertensive heart disease with heart failure: Secondary | ICD-10-CM | POA: Diagnosis not present

## 2023-05-31 DIAGNOSIS — E119 Type 2 diabetes mellitus without complications: Secondary | ICD-10-CM | POA: Diagnosis not present

## 2023-05-31 DIAGNOSIS — L89152 Pressure ulcer of sacral region, stage 2: Secondary | ICD-10-CM | POA: Diagnosis not present

## 2023-05-31 DIAGNOSIS — I6523 Occlusion and stenosis of bilateral carotid arteries: Secondary | ICD-10-CM | POA: Diagnosis not present

## 2023-05-31 DIAGNOSIS — S72142D Displaced intertrochanteric fracture of left femur, subsequent encounter for closed fracture with routine healing: Secondary | ICD-10-CM | POA: Diagnosis not present

## 2023-06-05 DIAGNOSIS — E119 Type 2 diabetes mellitus without complications: Secondary | ICD-10-CM | POA: Diagnosis not present

## 2023-06-05 DIAGNOSIS — I110 Hypertensive heart disease with heart failure: Secondary | ICD-10-CM | POA: Diagnosis not present

## 2023-06-05 DIAGNOSIS — L89152 Pressure ulcer of sacral region, stage 2: Secondary | ICD-10-CM | POA: Diagnosis not present

## 2023-06-05 DIAGNOSIS — I6523 Occlusion and stenosis of bilateral carotid arteries: Secondary | ICD-10-CM | POA: Diagnosis not present

## 2023-06-05 DIAGNOSIS — I350 Nonrheumatic aortic (valve) stenosis: Secondary | ICD-10-CM | POA: Diagnosis not present

## 2023-06-05 DIAGNOSIS — E782 Mixed hyperlipidemia: Secondary | ICD-10-CM | POA: Diagnosis not present

## 2023-06-05 DIAGNOSIS — S72142D Displaced intertrochanteric fracture of left femur, subsequent encounter for closed fracture with routine healing: Secondary | ICD-10-CM | POA: Diagnosis not present

## 2023-06-05 DIAGNOSIS — R29898 Other symptoms and signs involving the musculoskeletal system: Secondary | ICD-10-CM | POA: Diagnosis not present

## 2023-06-05 DIAGNOSIS — I5032 Chronic diastolic (congestive) heart failure: Secondary | ICD-10-CM | POA: Diagnosis not present

## 2023-06-06 ENCOUNTER — Telehealth: Payer: Self-pay

## 2023-06-06 NOTE — Telephone Encounter (Signed)
Vickie notified that the video consult will need to be done with Dr. Sedalia Muta. Vickie notified again that Dr. Sedalia Muta is out of the office until Monday. Vickie notified that Dr. Sedalia Muta has already addressed and signed a PT orders on 05/26/2023. Vickie stated that Dr. Sedalia Muta still has to do a video appointment, they will not accept the signed PT orders.  Vickie stated that Francine Graven is sending out a Charity fundraiser and a report will be forwarded to Dr. Sedalia Muta.  Dr. Sedalia Muta please advise.

## 2023-06-06 NOTE — Telephone Encounter (Signed)
Christine Newton or Christine Newton, This pts daughter called this morning stating that Christine Newton will need a virtual appointment ASAP to address Physical Therapy orders. She was notified that Dr .Sedalia Muta is out of the office this week.  Is this something either of you are able to see this week? Currently we have no openings. The daughter stated that Christine Newton is bed bound and she has done virtual appointments like this before with Dr. Sedalia Muta.

## 2023-06-07 DIAGNOSIS — I5032 Chronic diastolic (congestive) heart failure: Secondary | ICD-10-CM | POA: Diagnosis not present

## 2023-06-07 DIAGNOSIS — I110 Hypertensive heart disease with heart failure: Secondary | ICD-10-CM | POA: Diagnosis not present

## 2023-06-07 DIAGNOSIS — E119 Type 2 diabetes mellitus without complications: Secondary | ICD-10-CM | POA: Diagnosis not present

## 2023-06-07 DIAGNOSIS — R29898 Other symptoms and signs involving the musculoskeletal system: Secondary | ICD-10-CM | POA: Diagnosis not present

## 2023-06-07 DIAGNOSIS — E782 Mixed hyperlipidemia: Secondary | ICD-10-CM | POA: Diagnosis not present

## 2023-06-07 DIAGNOSIS — L89152 Pressure ulcer of sacral region, stage 2: Secondary | ICD-10-CM | POA: Diagnosis not present

## 2023-06-07 DIAGNOSIS — S72142D Displaced intertrochanteric fracture of left femur, subsequent encounter for closed fracture with routine healing: Secondary | ICD-10-CM | POA: Diagnosis not present

## 2023-06-07 DIAGNOSIS — I6523 Occlusion and stenosis of bilateral carotid arteries: Secondary | ICD-10-CM | POA: Diagnosis not present

## 2023-06-07 DIAGNOSIS — I350 Nonrheumatic aortic (valve) stenosis: Secondary | ICD-10-CM | POA: Diagnosis not present

## 2023-06-11 DIAGNOSIS — R32 Unspecified urinary incontinence: Secondary | ICD-10-CM | POA: Diagnosis not present

## 2023-06-11 DIAGNOSIS — E785 Hyperlipidemia, unspecified: Secondary | ICD-10-CM | POA: Diagnosis not present

## 2023-06-11 DIAGNOSIS — G319 Degenerative disease of nervous system, unspecified: Secondary | ICD-10-CM | POA: Diagnosis not present

## 2023-06-11 DIAGNOSIS — Z8673 Personal history of transient ischemic attack (TIA), and cerebral infarction without residual deficits: Secondary | ICD-10-CM | POA: Diagnosis not present

## 2023-06-11 DIAGNOSIS — E1151 Type 2 diabetes mellitus with diabetic peripheral angiopathy without gangrene: Secondary | ICD-10-CM | POA: Diagnosis not present

## 2023-06-11 DIAGNOSIS — K5900 Constipation, unspecified: Secondary | ICD-10-CM | POA: Diagnosis not present

## 2023-06-11 DIAGNOSIS — Z9181 History of falling: Secondary | ICD-10-CM | POA: Diagnosis not present

## 2023-06-11 DIAGNOSIS — M1990 Unspecified osteoarthritis, unspecified site: Secondary | ICD-10-CM | POA: Diagnosis not present

## 2023-06-11 DIAGNOSIS — G239 Degenerative disease of basal ganglia, unspecified: Secondary | ICD-10-CM | POA: Diagnosis not present

## 2023-06-11 DIAGNOSIS — F03A0 Unspecified dementia, mild, without behavioral disturbance, psychotic disturbance, mood disturbance, and anxiety: Secondary | ICD-10-CM | POA: Diagnosis not present

## 2023-06-11 DIAGNOSIS — I471 Supraventricular tachycardia, unspecified: Secondary | ICD-10-CM | POA: Diagnosis not present

## 2023-06-11 DIAGNOSIS — I129 Hypertensive chronic kidney disease with stage 1 through stage 4 chronic kidney disease, or unspecified chronic kidney disease: Secondary | ICD-10-CM | POA: Diagnosis not present

## 2023-06-11 DIAGNOSIS — I672 Cerebral atherosclerosis: Secondary | ICD-10-CM | POA: Diagnosis not present

## 2023-06-11 DIAGNOSIS — I771 Stricture of artery: Secondary | ICD-10-CM | POA: Diagnosis not present

## 2023-06-11 DIAGNOSIS — Z811 Family history of alcohol abuse and dependence: Secondary | ICD-10-CM | POA: Diagnosis not present

## 2023-06-11 DIAGNOSIS — Z87440 Personal history of urinary (tract) infections: Secondary | ICD-10-CM | POA: Diagnosis not present

## 2023-06-11 DIAGNOSIS — N1831 Chronic kidney disease, stage 3a: Secondary | ICD-10-CM | POA: Diagnosis not present

## 2023-06-11 DIAGNOSIS — R2681 Unsteadiness on feet: Secondary | ICD-10-CM | POA: Diagnosis not present

## 2023-06-12 ENCOUNTER — Telehealth: Payer: Self-pay

## 2023-06-12 NOTE — Telephone Encounter (Signed)
ENHABIT HH & HOSPICE POC ORDER NUMBER: [id number]

## 2023-06-13 DIAGNOSIS — Z952 Presence of prosthetic heart valve: Secondary | ICD-10-CM | POA: Diagnosis not present

## 2023-06-13 DIAGNOSIS — R29898 Other symptoms and signs involving the musculoskeletal system: Secondary | ICD-10-CM | POA: Diagnosis not present

## 2023-06-13 DIAGNOSIS — E782 Mixed hyperlipidemia: Secondary | ICD-10-CM | POA: Diagnosis not present

## 2023-06-13 DIAGNOSIS — I110 Hypertensive heart disease with heart failure: Secondary | ICD-10-CM | POA: Diagnosis not present

## 2023-06-13 DIAGNOSIS — I471 Supraventricular tachycardia, unspecified: Secondary | ICD-10-CM

## 2023-06-13 DIAGNOSIS — I6523 Occlusion and stenosis of bilateral carotid arteries: Secondary | ICD-10-CM | POA: Diagnosis not present

## 2023-06-13 DIAGNOSIS — S72142D Displaced intertrochanteric fracture of left femur, subsequent encounter for closed fracture with routine healing: Secondary | ICD-10-CM | POA: Diagnosis not present

## 2023-06-13 DIAGNOSIS — I5032 Chronic diastolic (congestive) heart failure: Secondary | ICD-10-CM | POA: Diagnosis not present

## 2023-06-13 DIAGNOSIS — I350 Nonrheumatic aortic (valve) stenosis: Secondary | ICD-10-CM | POA: Diagnosis not present

## 2023-06-13 DIAGNOSIS — E119 Type 2 diabetes mellitus without complications: Secondary | ICD-10-CM | POA: Diagnosis not present

## 2023-06-13 DIAGNOSIS — Z8673 Personal history of transient ischemic attack (TIA), and cerebral infarction without residual deficits: Secondary | ICD-10-CM

## 2023-06-13 DIAGNOSIS — R569 Unspecified convulsions: Secondary | ICD-10-CM

## 2023-06-13 DIAGNOSIS — L89152 Pressure ulcer of sacral region, stage 2: Secondary | ICD-10-CM | POA: Diagnosis not present

## 2023-06-14 NOTE — Telephone Encounter (Signed)
Vickie called this afternoon to follow-up. She has not heard back.   Dr. Sedalia Muta please advise.

## 2023-06-15 DIAGNOSIS — L89152 Pressure ulcer of sacral region, stage 2: Secondary | ICD-10-CM | POA: Diagnosis not present

## 2023-06-15 DIAGNOSIS — R29898 Other symptoms and signs involving the musculoskeletal system: Secondary | ICD-10-CM | POA: Diagnosis not present

## 2023-06-15 DIAGNOSIS — I5032 Chronic diastolic (congestive) heart failure: Secondary | ICD-10-CM | POA: Diagnosis not present

## 2023-06-15 DIAGNOSIS — I6523 Occlusion and stenosis of bilateral carotid arteries: Secondary | ICD-10-CM | POA: Diagnosis not present

## 2023-06-15 DIAGNOSIS — I110 Hypertensive heart disease with heart failure: Secondary | ICD-10-CM | POA: Diagnosis not present

## 2023-06-15 DIAGNOSIS — S72142D Displaced intertrochanteric fracture of left femur, subsequent encounter for closed fracture with routine healing: Secondary | ICD-10-CM | POA: Diagnosis not present

## 2023-06-15 DIAGNOSIS — E119 Type 2 diabetes mellitus without complications: Secondary | ICD-10-CM | POA: Diagnosis not present

## 2023-06-15 DIAGNOSIS — I350 Nonrheumatic aortic (valve) stenosis: Secondary | ICD-10-CM | POA: Diagnosis not present

## 2023-06-15 DIAGNOSIS — E782 Mixed hyperlipidemia: Secondary | ICD-10-CM | POA: Diagnosis not present

## 2023-06-15 NOTE — Telephone Encounter (Signed)
Left voicemail on vikki phone to call the office to get an appt scheduled.

## 2023-06-16 ENCOUNTER — Encounter: Payer: Self-pay | Admitting: Family Medicine

## 2023-06-16 ENCOUNTER — Telehealth: Payer: Medicare PPO | Admitting: Family Medicine

## 2023-06-16 VITALS — BP 133/83 | HR 65 | Temp 98.3°F | Ht 66.0 in | Wt 140.0 lb

## 2023-06-16 DIAGNOSIS — S72002D Fracture of unspecified part of neck of left femur, subsequent encounter for closed fracture with routine healing: Secondary | ICD-10-CM

## 2023-06-16 DIAGNOSIS — Z952 Presence of prosthetic heart valve: Secondary | ICD-10-CM

## 2023-06-16 DIAGNOSIS — E1169 Type 2 diabetes mellitus with other specified complication: Secondary | ICD-10-CM

## 2023-06-16 DIAGNOSIS — R29898 Other symptoms and signs involving the musculoskeletal system: Secondary | ICD-10-CM

## 2023-06-16 DIAGNOSIS — R5381 Other malaise: Secondary | ICD-10-CM

## 2023-06-16 DIAGNOSIS — I5032 Chronic diastolic (congestive) heart failure: Secondary | ICD-10-CM

## 2023-06-16 DIAGNOSIS — E782 Mixed hyperlipidemia: Secondary | ICD-10-CM

## 2023-06-16 DIAGNOSIS — G40909 Epilepsy, unspecified, not intractable, without status epilepticus: Secondary | ICD-10-CM | POA: Diagnosis not present

## 2023-06-16 DIAGNOSIS — I110 Hypertensive heart disease with heart failure: Secondary | ICD-10-CM

## 2023-06-16 NOTE — Progress Notes (Unsigned)
Subjective:  Patient ID: Christine Newton, female    DOB: 01/13/1933  Age: 89 y.o. MRN: [id number]  No chief complaint on file.   HPI   Christine Newton is a 89 y.o. female with history of syncope episodes and seizure-like episodes. Patient had closed left femoral fracture on October 01, 2022.  She was admitted and had a left hip cephalomedullary nail placement on October 8.  She was discharged to skilled nursing home with PT and OT recommended.  The patient states 29 days in the skilled nursing facility.  On the day she was discharged her daughter found her with bruising and contusions.  She did report this to the state.  Her daughter is concerned that she did not get the occupational and physical therapy that was appropriate at the skilled nursing facility.  She was discharged home unable to walk.  Due to recent episode on January 11, 2023 of possible seizures and her trip to the emergency department the home health care company has discontinued there therapy.  They would like to restart therapy.  Initially when they came home from the skilled nursing facility, she was receiving OT, PT, and ST.  Speech therapy worked on her swallowing and she was discharged from speech therapy.  Her daughter and her personal care assistant have been doing range of motion exercises with her legs.  Cardiac history: History of nonrheumatic aortic stenosis status post aortic valve replacement with #21 CE biologic with septal myectomy 2014. 11/14/2020: Echocardiogram: Normal LVEF 02/19/2021: Hospitalization for syncopal episode-thought to be secondary to hypotension.  At that time discontinued amlodipine.  Continued ACE inhibitor.  There was concern for TIA.  Put on aspirin and Plavix.  Discontinue Plavix due to rectal bleeding. 02/19/2021: EEG question of underlying seizure activity at that time.  Put on Keppra which she did not tolerate.  08/2022 admission Echo 09/01/2022 revealed LVEF 55 to 60%, moderate LVH,  grade 1 diastolic dysfunction, normal RV, normal aortic valve prosthesis, no significant change from prior study. 10/23/2022 normal sinus rhythm with IVCD and first-degree AV block, frequent WCT at rates > 150 bpm. Likely SVT with IVCD rather than VT. Daughter reports she has been out of metoprolol. It looks like perhaps the prescription for the metoprolol was printed rather than sent to the pharmacy.  Neurological history: MRI Brain (08/23/2022): Chronic ischemic infarcts of left frontal and parietal lobes and scattered remote lacunar infarcts of the left centrum semiovale I will, bilateral basal ganglia and cerebellar.  This also showed a pituitary macro adenoma that is unchanged from previous scan 2 years ago. CTA (9/62023) 40% stenosis of right ICA origin and approximately 50% stenosis of right vertebral artery. EEGs suggestive of focal left temporal slowing which may be postictal .  Patient has been treated for seizures in the past with Keppra but this caused confusion and lethargy. Developed black stools on Plavix and aspirin, so these were held.  Patient has had a syncopal episodes over the last 12 months.  Differential diagnosis included orthostatic hypotension, seizures, and TIAs.  She has had numerous EEGs some were normal and some showed left temporal slowing.  Patient did not tolerate Keppra in the past.  She was started on Depakote 500 mg twice daily in November 2024.  This was very sedating.  This has been gradually getting decreased to 250 mg nightly.  Patient is set to follow-up with Dr. Pearlean Brownie today.  Christine Newton is diabetic.  She is currently on metformin.  Her sugars are  always less than 100.  She has had some in the 70s.  Per the personal care assistant and her daughter at the time of the syncopal episodes her sugars have always been fine.   Other complaints: Left leg pain: A hard mass behind her leg.  This was felt to be a calcium deposit.  Patient has followed up with Dr. Janalee Dane, who  is her orthopedic surgeon.  Patient spirits have been good.  Her daughter denies she is having any depression. ADLs: Patient is unable to get out of the bed.         06/16/2023    8:10 AM 10/07/2021   10:25 AM 09/17/2020    2:06 PM 09/17/2020    1:39 PM  Depression screen PHQ 2/9  Decreased Interest 0 0 0 0  Down, Depressed, Hopeless 0 0 0 0  PHQ - 2 Score 0 0 0 0        06/16/2023    8:09 AM  Fall Risk   Falls in the past year? 0  Number falls in past yr: 0  Injury with Fall? 0  Risk for fall due to : Impaired balance/gait;Impaired mobility  Follow up Falls evaluation completed;Falls prevention discussed    Patient Care Team: Blane Ohara, MD as PCP - General (Family Medicine) Lyn Records, MD (Inactive) as PCP - Cardiology (Cardiology)   Review of Systems  Constitutional:  Positive for fatigue. Negative for chills and fever.  HENT:  Negative for congestion, rhinorrhea and sore throat.   Respiratory:  Negative for cough and shortness of breath.   Cardiovascular:  Negative for chest pain.  Gastrointestinal:  Negative for abdominal pain, constipation, diarrhea, nausea and vomiting.  Genitourinary:  Negative for dysuria and urgency.  Musculoskeletal:  Negative for back pain and myalgias.  Neurological:  Positive for weakness. Negative for dizziness, light-headedness and headaches.  Psychiatric/Behavioral:  Negative for dysphoric mood. The patient is not nervous/anxious.     Current Outpatient Medications on File Prior to Visit  Medication Sig Dispense Refill   aspirin EC 81 MG tablet Take 1 tablet (81 mg total) by mouth daily. Swallow whole. (Patient taking differently: Take 81 mg by mouth daily.) 30 tablet 11   atorvastatin (LIPITOR) 40 MG tablet TAKE 1 TABLET BY MOUTH EVERY DAY 90 tablet 1   divalproex (DEPAKOTE) 250 MG DR tablet TAKE 1 TABLET BY MOUTH TWICE A DAY (Patient taking differently: Take 250 mg by mouth daily.) 180 tablet 1   lisinopril (ZESTRIL) 10 MG tablet  TAKE 1 TABLET BY MOUTH EVERY DAY 90 tablet 0   metoprolol tartrate (LOPRESSOR) 25 MG tablet TAKE 0.5 TABLETS BY MOUTH 2 TIMES DAILY. 90 tablet 0   Carboxymethylcellulose Sodium (DRY EYE RELIEF OP) Place 1 drop into both eyes daily as needed (dry eyes). (Patient not taking: Reported on 06/16/2023)     No current facility-administered medications on file prior to visit.   Past Medical History:  Diagnosis Date   Closed fracture of left inferior and superior pubic ramus (HCC) 01/19/2022   Diabetes (HCC)    Hypertension    Nonrheumatic aortic (valve) stenosis 09/21/2020   Past Surgical History:  Procedure Laterality Date   CARDIAC VALVE REPLACEMENT     CHOLECYSTECTOMY     INTRAMEDULLARY (IM) NAIL INTERTROCHANTERIC Left 10/02/2022   Procedure: INTRAMEDULLARY (IM) NAIL INTERTROCHANTERIC;  Surgeon: Huel Cote, MD;  Location: [facility] OR;  Service: Orthopedics;  Laterality: Left;    History reviewed. No pertinent family history. Social History  Socioeconomic History   Marital status: Widowed    Spouse name: Not on file   Number of children: 2   Years of education: 12 + 4   Highest education level: Not on file  Occupational History   Occupation: RETIRED SOCIAL WORKER  Tobacco Use   Smoking status: Never   Smokeless tobacco: Never  Vaping Use   Vaping Use: Never used  Substance and Sexual Activity   Alcohol use: Never   Drug use: Never   Sexual activity: Not Currently  Other Topics Concern   Not on file  Social History Narrative   Not on file   Social Determinants of Health   Financial Resource Strain: Not on file  Food Insecurity: Not on file  Transportation Needs: Not on file  Physical Activity: Not on file  Stress: Not on file  Social Connections: Not on file    Objective:  BP 133/83   Pulse 65   Temp 98.3 F (36.8 C)   Ht 5\' 6"  (1.676 m)   Wt 140 lb (63.5 kg)   BMI 22.60 kg/m      06/16/2023    8:05 AM 02/01/2023    2:30 AM 02/01/2023    2:15 AM  BP/Weight   Systolic BP 133 137 125  Diastolic BP 83 79 79  Wt. (Lbs) 140    BMI 22.6 kg/m2      Physical Exam  Diabetic Foot Exam - Simple   No data filed      Lab Results  Component Value Date   WBC 12.6 (H) 01/31/2023   HGB 11.7 (L) 01/31/2023   HCT 37.1 01/31/2023   PLT 272 01/31/2023   GLUCOSE 125 (H) 01/31/2023   CHOL 167 07/12/2022   TRIG 99 07/12/2022   HDL 48 07/12/2022   LDLCALC 101 (H) 07/12/2022   ALT 9 01/31/2023   AST 16 01/31/2023   NA 141 01/31/2023   K 3.9 01/31/2023   CL 106 01/31/2023   CREATININE 0.74 01/31/2023   BUN 23 01/31/2023   CO2 26 01/31/2023   TSH 1.010 11/01/2021   INR 1.0 11/18/2022   HGBA1C 7.0 (H) 07/12/2022      Assessment & Plan:    There are no diagnoses linked to this encounter.   No orders of the defined types were placed in this encounter.   No orders of the defined types were placed in this encounter.    Follow-up: No follow-ups on file.   I,Carolyn M Morrison,acting as a Neurosurgeon for Blane Ohara, MD.,have documented all relevant documentation on the behalf of Blane Ohara, MD,as directed by  Blane Ohara, MD while in the presence of Blane Ohara, MD.   An After Visit Summary was printed and given to the patient.  Blane Ohara, MD [facility] [phone]

## 2023-06-19 ENCOUNTER — Encounter: Payer: Self-pay | Admitting: Family Medicine

## 2023-06-19 DIAGNOSIS — G40909 Epilepsy, unspecified, not intractable, without status epilepticus: Secondary | ICD-10-CM | POA: Insufficient documentation

## 2023-06-19 NOTE — Assessment & Plan Note (Signed)
The current medical regimen is effective;  continue present plan and medications.  

## 2023-06-19 NOTE — Assessment & Plan Note (Signed)
Refer for home health care physical therapy  

## 2023-06-19 NOTE — Assessment & Plan Note (Signed)
Follows with cardiology 

## 2023-06-19 NOTE — Assessment & Plan Note (Signed)
Refer for home health care physical therapy

## 2023-06-19 NOTE — Assessment & Plan Note (Signed)
The current medical regimen is effective;  continue present plan and medications. Recommend continue to work on eating healthy diet and exercise.  

## 2023-06-19 NOTE — Assessment & Plan Note (Signed)
Continue depakote 250 mg daily. No seizures since last visit with Dr. Pearlean Brownie.

## 2023-06-19 NOTE — Assessment & Plan Note (Signed)
History of fracture in fall 2023. Not recovered fully. Needs more physical therapy.

## 2023-06-20 DIAGNOSIS — S72142D Displaced intertrochanteric fracture of left femur, subsequent encounter for closed fracture with routine healing: Secondary | ICD-10-CM | POA: Diagnosis not present

## 2023-06-20 DIAGNOSIS — R29898 Other symptoms and signs involving the musculoskeletal system: Secondary | ICD-10-CM | POA: Diagnosis not present

## 2023-06-20 DIAGNOSIS — I110 Hypertensive heart disease with heart failure: Secondary | ICD-10-CM | POA: Diagnosis not present

## 2023-06-20 DIAGNOSIS — I5032 Chronic diastolic (congestive) heart failure: Secondary | ICD-10-CM | POA: Diagnosis not present

## 2023-06-20 DIAGNOSIS — E782 Mixed hyperlipidemia: Secondary | ICD-10-CM | POA: Diagnosis not present

## 2023-06-20 DIAGNOSIS — I350 Nonrheumatic aortic (valve) stenosis: Secondary | ICD-10-CM | POA: Diagnosis not present

## 2023-06-20 DIAGNOSIS — I6523 Occlusion and stenosis of bilateral carotid arteries: Secondary | ICD-10-CM | POA: Diagnosis not present

## 2023-06-20 DIAGNOSIS — L89152 Pressure ulcer of sacral region, stage 2: Secondary | ICD-10-CM | POA: Diagnosis not present

## 2023-06-20 DIAGNOSIS — E119 Type 2 diabetes mellitus without complications: Secondary | ICD-10-CM | POA: Diagnosis not present

## 2023-06-24 DIAGNOSIS — I350 Nonrheumatic aortic (valve) stenosis: Secondary | ICD-10-CM | POA: Diagnosis not present

## 2023-06-24 DIAGNOSIS — E782 Mixed hyperlipidemia: Secondary | ICD-10-CM | POA: Diagnosis not present

## 2023-06-24 DIAGNOSIS — R29898 Other symptoms and signs involving the musculoskeletal system: Secondary | ICD-10-CM | POA: Diagnosis not present

## 2023-06-24 DIAGNOSIS — I110 Hypertensive heart disease with heart failure: Secondary | ICD-10-CM | POA: Diagnosis not present

## 2023-06-24 DIAGNOSIS — L89152 Pressure ulcer of sacral region, stage 2: Secondary | ICD-10-CM | POA: Diagnosis not present

## 2023-06-24 DIAGNOSIS — I5032 Chronic diastolic (congestive) heart failure: Secondary | ICD-10-CM | POA: Diagnosis not present

## 2023-06-24 DIAGNOSIS — I6523 Occlusion and stenosis of bilateral carotid arteries: Secondary | ICD-10-CM | POA: Diagnosis not present

## 2023-06-24 DIAGNOSIS — E119 Type 2 diabetes mellitus without complications: Secondary | ICD-10-CM | POA: Diagnosis not present

## 2023-06-24 DIAGNOSIS — S72142D Displaced intertrochanteric fracture of left femur, subsequent encounter for closed fracture with routine healing: Secondary | ICD-10-CM | POA: Diagnosis not present

## 2023-06-27 DIAGNOSIS — I6523 Occlusion and stenosis of bilateral carotid arteries: Secondary | ICD-10-CM | POA: Diagnosis not present

## 2023-06-27 DIAGNOSIS — E782 Mixed hyperlipidemia: Secondary | ICD-10-CM | POA: Diagnosis not present

## 2023-06-27 DIAGNOSIS — I350 Nonrheumatic aortic (valve) stenosis: Secondary | ICD-10-CM | POA: Diagnosis not present

## 2023-06-27 DIAGNOSIS — S72142D Displaced intertrochanteric fracture of left femur, subsequent encounter for closed fracture with routine healing: Secondary | ICD-10-CM | POA: Diagnosis not present

## 2023-06-27 DIAGNOSIS — I110 Hypertensive heart disease with heart failure: Secondary | ICD-10-CM | POA: Diagnosis not present

## 2023-06-27 DIAGNOSIS — R29898 Other symptoms and signs involving the musculoskeletal system: Secondary | ICD-10-CM | POA: Diagnosis not present

## 2023-06-27 DIAGNOSIS — L89152 Pressure ulcer of sacral region, stage 2: Secondary | ICD-10-CM | POA: Diagnosis not present

## 2023-06-27 DIAGNOSIS — I5032 Chronic diastolic (congestive) heart failure: Secondary | ICD-10-CM | POA: Diagnosis not present

## 2023-06-27 DIAGNOSIS — E119 Type 2 diabetes mellitus without complications: Secondary | ICD-10-CM | POA: Diagnosis not present

## 2023-07-03 DIAGNOSIS — I350 Nonrheumatic aortic (valve) stenosis: Secondary | ICD-10-CM | POA: Diagnosis not present

## 2023-07-03 DIAGNOSIS — E119 Type 2 diabetes mellitus without complications: Secondary | ICD-10-CM | POA: Diagnosis not present

## 2023-07-03 DIAGNOSIS — E782 Mixed hyperlipidemia: Secondary | ICD-10-CM | POA: Diagnosis not present

## 2023-07-03 DIAGNOSIS — I110 Hypertensive heart disease with heart failure: Secondary | ICD-10-CM | POA: Diagnosis not present

## 2023-07-03 DIAGNOSIS — I6523 Occlusion and stenosis of bilateral carotid arteries: Secondary | ICD-10-CM | POA: Diagnosis not present

## 2023-07-03 DIAGNOSIS — L89152 Pressure ulcer of sacral region, stage 2: Secondary | ICD-10-CM | POA: Diagnosis not present

## 2023-07-03 DIAGNOSIS — R29898 Other symptoms and signs involving the musculoskeletal system: Secondary | ICD-10-CM | POA: Diagnosis not present

## 2023-07-03 DIAGNOSIS — S72142D Displaced intertrochanteric fracture of left femur, subsequent encounter for closed fracture with routine healing: Secondary | ICD-10-CM | POA: Diagnosis not present

## 2023-07-03 DIAGNOSIS — I5032 Chronic diastolic (congestive) heart failure: Secondary | ICD-10-CM | POA: Diagnosis not present

## 2023-07-05 DIAGNOSIS — I350 Nonrheumatic aortic (valve) stenosis: Secondary | ICD-10-CM | POA: Diagnosis not present

## 2023-07-05 DIAGNOSIS — I6523 Occlusion and stenosis of bilateral carotid arteries: Secondary | ICD-10-CM | POA: Diagnosis not present

## 2023-07-05 DIAGNOSIS — R29898 Other symptoms and signs involving the musculoskeletal system: Secondary | ICD-10-CM | POA: Diagnosis not present

## 2023-07-05 DIAGNOSIS — S72142D Displaced intertrochanteric fracture of left femur, subsequent encounter for closed fracture with routine healing: Secondary | ICD-10-CM | POA: Diagnosis not present

## 2023-07-05 DIAGNOSIS — E119 Type 2 diabetes mellitus without complications: Secondary | ICD-10-CM | POA: Diagnosis not present

## 2023-07-05 DIAGNOSIS — E782 Mixed hyperlipidemia: Secondary | ICD-10-CM | POA: Diagnosis not present

## 2023-07-05 DIAGNOSIS — I110 Hypertensive heart disease with heart failure: Secondary | ICD-10-CM | POA: Diagnosis not present

## 2023-07-05 DIAGNOSIS — I5032 Chronic diastolic (congestive) heart failure: Secondary | ICD-10-CM | POA: Diagnosis not present

## 2023-07-05 DIAGNOSIS — L89152 Pressure ulcer of sacral region, stage 2: Secondary | ICD-10-CM | POA: Diagnosis not present

## 2023-07-13 DIAGNOSIS — L89152 Pressure ulcer of sacral region, stage 2: Secondary | ICD-10-CM | POA: Diagnosis not present

## 2023-07-13 DIAGNOSIS — E119 Type 2 diabetes mellitus without complications: Secondary | ICD-10-CM | POA: Diagnosis not present

## 2023-07-13 DIAGNOSIS — R29898 Other symptoms and signs involving the musculoskeletal system: Secondary | ICD-10-CM | POA: Diagnosis not present

## 2023-07-13 DIAGNOSIS — I110 Hypertensive heart disease with heart failure: Secondary | ICD-10-CM | POA: Diagnosis not present

## 2023-07-13 DIAGNOSIS — I6523 Occlusion and stenosis of bilateral carotid arteries: Secondary | ICD-10-CM | POA: Diagnosis not present

## 2023-07-13 DIAGNOSIS — I350 Nonrheumatic aortic (valve) stenosis: Secondary | ICD-10-CM | POA: Diagnosis not present

## 2023-07-13 DIAGNOSIS — I5032 Chronic diastolic (congestive) heart failure: Secondary | ICD-10-CM | POA: Diagnosis not present

## 2023-07-13 DIAGNOSIS — E782 Mixed hyperlipidemia: Secondary | ICD-10-CM | POA: Diagnosis not present

## 2023-07-13 DIAGNOSIS — S72142D Displaced intertrochanteric fracture of left femur, subsequent encounter for closed fracture with routine healing: Secondary | ICD-10-CM | POA: Diagnosis not present

## 2023-07-17 ENCOUNTER — Telehealth: Payer: Self-pay

## 2023-07-17 ENCOUNTER — Encounter: Payer: Self-pay | Admitting: Family Medicine

## 2023-07-17 ENCOUNTER — Other Ambulatory Visit: Payer: Self-pay | Admitting: Family Medicine

## 2023-07-17 NOTE — Telephone Encounter (Signed)
Lorene Dy from [facility] called requesting continuation of visit with the patient for PT. I gave her the Verbal ok with the assurance they would send over the orders to be signed.

## 2023-07-18 ENCOUNTER — Telehealth: Payer: Self-pay

## 2023-07-18 DIAGNOSIS — I6523 Occlusion and stenosis of bilateral carotid arteries: Secondary | ICD-10-CM | POA: Diagnosis not present

## 2023-07-18 DIAGNOSIS — I110 Hypertensive heart disease with heart failure: Secondary | ICD-10-CM | POA: Diagnosis not present

## 2023-07-18 DIAGNOSIS — L89152 Pressure ulcer of sacral region, stage 2: Secondary | ICD-10-CM | POA: Diagnosis not present

## 2023-07-18 DIAGNOSIS — S72142D Displaced intertrochanteric fracture of left femur, subsequent encounter for closed fracture with routine healing: Secondary | ICD-10-CM | POA: Diagnosis not present

## 2023-07-18 DIAGNOSIS — I350 Nonrheumatic aortic (valve) stenosis: Secondary | ICD-10-CM | POA: Diagnosis not present

## 2023-07-18 DIAGNOSIS — E782 Mixed hyperlipidemia: Secondary | ICD-10-CM | POA: Diagnosis not present

## 2023-07-18 DIAGNOSIS — I5032 Chronic diastolic (congestive) heart failure: Secondary | ICD-10-CM | POA: Diagnosis not present

## 2023-07-18 DIAGNOSIS — R29898 Other symptoms and signs involving the musculoskeletal system: Secondary | ICD-10-CM | POA: Diagnosis not present

## 2023-07-18 DIAGNOSIS — E119 Type 2 diabetes mellitus without complications: Secondary | ICD-10-CM | POA: Diagnosis not present

## 2023-07-18 NOTE — Telephone Encounter (Signed)
[facility] home [facility]-order number: [id number]

## 2023-07-18 NOTE — Telephone Encounter (Signed)
Christine Newton with [facility] called requested VO for continuation of PT two times for 4 weeks and once a week for 2 weeks. Called gave verbal ok via her voicemail, she is to fax over orders with this request for Dr. Sedalia Muta to sign.

## 2023-07-21 ENCOUNTER — Telehealth: Payer: Self-pay | Admitting: Family Medicine

## 2023-07-21 ENCOUNTER — Other Ambulatory Visit: Payer: Self-pay | Admitting: Family Medicine

## 2023-07-21 DIAGNOSIS — I471 Supraventricular tachycardia, unspecified: Secondary | ICD-10-CM

## 2023-07-21 NOTE — Telephone Encounter (Signed)
[facility] POC [id number]

## 2023-07-24 DIAGNOSIS — R29898 Other symptoms and signs involving the musculoskeletal system: Secondary | ICD-10-CM | POA: Diagnosis not present

## 2023-07-24 DIAGNOSIS — I110 Hypertensive heart disease with heart failure: Secondary | ICD-10-CM | POA: Diagnosis not present

## 2023-07-24 DIAGNOSIS — E782 Mixed hyperlipidemia: Secondary | ICD-10-CM | POA: Diagnosis not present

## 2023-07-24 DIAGNOSIS — I350 Nonrheumatic aortic (valve) stenosis: Secondary | ICD-10-CM | POA: Diagnosis not present

## 2023-07-24 DIAGNOSIS — I5032 Chronic diastolic (congestive) heart failure: Secondary | ICD-10-CM | POA: Diagnosis not present

## 2023-07-24 DIAGNOSIS — I6523 Occlusion and stenosis of bilateral carotid arteries: Secondary | ICD-10-CM | POA: Diagnosis not present

## 2023-07-24 DIAGNOSIS — E119 Type 2 diabetes mellitus without complications: Secondary | ICD-10-CM | POA: Diagnosis not present

## 2023-07-24 DIAGNOSIS — S72142D Displaced intertrochanteric fracture of left femur, subsequent encounter for closed fracture with routine healing: Secondary | ICD-10-CM | POA: Diagnosis not present

## 2023-07-24 DIAGNOSIS — R5381 Other malaise: Secondary | ICD-10-CM | POA: Diagnosis not present

## 2023-07-25 DIAGNOSIS — Z952 Presence of prosthetic heart valve: Secondary | ICD-10-CM

## 2023-07-25 DIAGNOSIS — I350 Nonrheumatic aortic (valve) stenosis: Secondary | ICD-10-CM | POA: Diagnosis not present

## 2023-07-25 DIAGNOSIS — I110 Hypertensive heart disease with heart failure: Secondary | ICD-10-CM | POA: Diagnosis not present

## 2023-07-25 DIAGNOSIS — E119 Type 2 diabetes mellitus without complications: Secondary | ICD-10-CM | POA: Diagnosis not present

## 2023-07-25 DIAGNOSIS — S72142D Displaced intertrochanteric fracture of left femur, subsequent encounter for closed fracture with routine healing: Secondary | ICD-10-CM | POA: Diagnosis not present

## 2023-07-25 DIAGNOSIS — I471 Supraventricular tachycardia, unspecified: Secondary | ICD-10-CM

## 2023-07-25 DIAGNOSIS — E782 Mixed hyperlipidemia: Secondary | ICD-10-CM | POA: Diagnosis not present

## 2023-07-25 DIAGNOSIS — R5381 Other malaise: Secondary | ICD-10-CM | POA: Diagnosis not present

## 2023-07-25 DIAGNOSIS — I6523 Occlusion and stenosis of bilateral carotid arteries: Secondary | ICD-10-CM | POA: Diagnosis not present

## 2023-07-25 DIAGNOSIS — R569 Unspecified convulsions: Secondary | ICD-10-CM

## 2023-07-25 DIAGNOSIS — R29898 Other symptoms and signs involving the musculoskeletal system: Secondary | ICD-10-CM | POA: Diagnosis not present

## 2023-07-25 DIAGNOSIS — I5032 Chronic diastolic (congestive) heart failure: Secondary | ICD-10-CM | POA: Diagnosis not present

## 2023-07-26 DIAGNOSIS — I5032 Chronic diastolic (congestive) heart failure: Secondary | ICD-10-CM | POA: Diagnosis not present

## 2023-07-26 DIAGNOSIS — E782 Mixed hyperlipidemia: Secondary | ICD-10-CM | POA: Diagnosis not present

## 2023-07-26 DIAGNOSIS — R29898 Other symptoms and signs involving the musculoskeletal system: Secondary | ICD-10-CM | POA: Diagnosis not present

## 2023-07-26 DIAGNOSIS — S72142D Displaced intertrochanteric fracture of left femur, subsequent encounter for closed fracture with routine healing: Secondary | ICD-10-CM | POA: Diagnosis not present

## 2023-07-26 DIAGNOSIS — I350 Nonrheumatic aortic (valve) stenosis: Secondary | ICD-10-CM | POA: Diagnosis not present

## 2023-07-26 DIAGNOSIS — I110 Hypertensive heart disease with heart failure: Secondary | ICD-10-CM | POA: Diagnosis not present

## 2023-07-26 DIAGNOSIS — R5381 Other malaise: Secondary | ICD-10-CM | POA: Diagnosis not present

## 2023-07-26 DIAGNOSIS — E119 Type 2 diabetes mellitus without complications: Secondary | ICD-10-CM | POA: Diagnosis not present

## 2023-07-26 DIAGNOSIS — I6523 Occlusion and stenosis of bilateral carotid arteries: Secondary | ICD-10-CM | POA: Diagnosis not present

## 2023-07-27 ENCOUNTER — Telehealth: Payer: Self-pay | Admitting: Family Medicine

## 2023-07-27 NOTE — Telephone Encounter (Signed)
[facility] -Order [phone]

## 2023-07-28 DIAGNOSIS — S72142D Displaced intertrochanteric fracture of left femur, subsequent encounter for closed fracture with routine healing: Secondary | ICD-10-CM | POA: Diagnosis not present

## 2023-07-28 DIAGNOSIS — R5381 Other malaise: Secondary | ICD-10-CM | POA: Diagnosis not present

## 2023-07-28 DIAGNOSIS — I350 Nonrheumatic aortic (valve) stenosis: Secondary | ICD-10-CM | POA: Diagnosis not present

## 2023-07-28 DIAGNOSIS — R29898 Other symptoms and signs involving the musculoskeletal system: Secondary | ICD-10-CM | POA: Diagnosis not present

## 2023-07-28 DIAGNOSIS — E119 Type 2 diabetes mellitus without complications: Secondary | ICD-10-CM | POA: Diagnosis not present

## 2023-07-28 DIAGNOSIS — I5032 Chronic diastolic (congestive) heart failure: Secondary | ICD-10-CM | POA: Diagnosis not present

## 2023-07-28 DIAGNOSIS — I6523 Occlusion and stenosis of bilateral carotid arteries: Secondary | ICD-10-CM | POA: Diagnosis not present

## 2023-07-28 DIAGNOSIS — I110 Hypertensive heart disease with heart failure: Secondary | ICD-10-CM | POA: Diagnosis not present

## 2023-07-28 DIAGNOSIS — E782 Mixed hyperlipidemia: Secondary | ICD-10-CM | POA: Diagnosis not present

## 2023-08-01 DIAGNOSIS — I5032 Chronic diastolic (congestive) heart failure: Secondary | ICD-10-CM | POA: Diagnosis not present

## 2023-08-01 DIAGNOSIS — I110 Hypertensive heart disease with heart failure: Secondary | ICD-10-CM | POA: Diagnosis not present

## 2023-08-01 DIAGNOSIS — I350 Nonrheumatic aortic (valve) stenosis: Secondary | ICD-10-CM | POA: Diagnosis not present

## 2023-08-01 DIAGNOSIS — R5381 Other malaise: Secondary | ICD-10-CM | POA: Diagnosis not present

## 2023-08-01 DIAGNOSIS — S72142D Displaced intertrochanteric fracture of left femur, subsequent encounter for closed fracture with routine healing: Secondary | ICD-10-CM | POA: Diagnosis not present

## 2023-08-01 DIAGNOSIS — E782 Mixed hyperlipidemia: Secondary | ICD-10-CM | POA: Diagnosis not present

## 2023-08-01 DIAGNOSIS — R29898 Other symptoms and signs involving the musculoskeletal system: Secondary | ICD-10-CM | POA: Diagnosis not present

## 2023-08-01 DIAGNOSIS — I6523 Occlusion and stenosis of bilateral carotid arteries: Secondary | ICD-10-CM | POA: Diagnosis not present

## 2023-08-01 DIAGNOSIS — E119 Type 2 diabetes mellitus without complications: Secondary | ICD-10-CM | POA: Diagnosis not present

## 2023-08-02 DIAGNOSIS — R29898 Other symptoms and signs involving the musculoskeletal system: Secondary | ICD-10-CM | POA: Diagnosis not present

## 2023-08-02 DIAGNOSIS — I5032 Chronic diastolic (congestive) heart failure: Secondary | ICD-10-CM | POA: Diagnosis not present

## 2023-08-02 DIAGNOSIS — E119 Type 2 diabetes mellitus without complications: Secondary | ICD-10-CM | POA: Diagnosis not present

## 2023-08-02 DIAGNOSIS — I110 Hypertensive heart disease with heart failure: Secondary | ICD-10-CM | POA: Diagnosis not present

## 2023-08-02 DIAGNOSIS — S72142D Displaced intertrochanteric fracture of left femur, subsequent encounter for closed fracture with routine healing: Secondary | ICD-10-CM | POA: Diagnosis not present

## 2023-08-02 DIAGNOSIS — I6523 Occlusion and stenosis of bilateral carotid arteries: Secondary | ICD-10-CM | POA: Diagnosis not present

## 2023-08-02 DIAGNOSIS — R5381 Other malaise: Secondary | ICD-10-CM | POA: Diagnosis not present

## 2023-08-02 DIAGNOSIS — I350 Nonrheumatic aortic (valve) stenosis: Secondary | ICD-10-CM | POA: Diagnosis not present

## 2023-08-02 DIAGNOSIS — E782 Mixed hyperlipidemia: Secondary | ICD-10-CM | POA: Diagnosis not present

## 2023-08-03 DIAGNOSIS — S72142D Displaced intertrochanteric fracture of left femur, subsequent encounter for closed fracture with routine healing: Secondary | ICD-10-CM | POA: Diagnosis not present

## 2023-08-03 DIAGNOSIS — R5381 Other malaise: Secondary | ICD-10-CM | POA: Diagnosis not present

## 2023-08-03 DIAGNOSIS — I110 Hypertensive heart disease with heart failure: Secondary | ICD-10-CM | POA: Diagnosis not present

## 2023-08-03 DIAGNOSIS — I5032 Chronic diastolic (congestive) heart failure: Secondary | ICD-10-CM | POA: Diagnosis not present

## 2023-08-03 DIAGNOSIS — E119 Type 2 diabetes mellitus without complications: Secondary | ICD-10-CM | POA: Diagnosis not present

## 2023-08-03 DIAGNOSIS — I6523 Occlusion and stenosis of bilateral carotid arteries: Secondary | ICD-10-CM | POA: Diagnosis not present

## 2023-08-03 DIAGNOSIS — I350 Nonrheumatic aortic (valve) stenosis: Secondary | ICD-10-CM | POA: Diagnosis not present

## 2023-08-03 DIAGNOSIS — R29898 Other symptoms and signs involving the musculoskeletal system: Secondary | ICD-10-CM | POA: Diagnosis not present

## 2023-08-03 DIAGNOSIS — E782 Mixed hyperlipidemia: Secondary | ICD-10-CM | POA: Diagnosis not present

## 2023-08-04 DIAGNOSIS — E119 Type 2 diabetes mellitus without complications: Secondary | ICD-10-CM | POA: Diagnosis not present

## 2023-08-04 DIAGNOSIS — I350 Nonrheumatic aortic (valve) stenosis: Secondary | ICD-10-CM | POA: Diagnosis not present

## 2023-08-04 DIAGNOSIS — I110 Hypertensive heart disease with heart failure: Secondary | ICD-10-CM | POA: Diagnosis not present

## 2023-08-04 DIAGNOSIS — R5381 Other malaise: Secondary | ICD-10-CM | POA: Diagnosis not present

## 2023-08-04 DIAGNOSIS — I5032 Chronic diastolic (congestive) heart failure: Secondary | ICD-10-CM | POA: Diagnosis not present

## 2023-08-04 DIAGNOSIS — E782 Mixed hyperlipidemia: Secondary | ICD-10-CM | POA: Diagnosis not present

## 2023-08-04 DIAGNOSIS — R29898 Other symptoms and signs involving the musculoskeletal system: Secondary | ICD-10-CM | POA: Diagnosis not present

## 2023-08-04 DIAGNOSIS — I6523 Occlusion and stenosis of bilateral carotid arteries: Secondary | ICD-10-CM | POA: Diagnosis not present

## 2023-08-04 DIAGNOSIS — S72142D Displaced intertrochanteric fracture of left femur, subsequent encounter for closed fracture with routine healing: Secondary | ICD-10-CM | POA: Diagnosis not present

## 2023-08-07 DIAGNOSIS — I5032 Chronic diastolic (congestive) heart failure: Secondary | ICD-10-CM | POA: Diagnosis not present

## 2023-08-07 DIAGNOSIS — R5381 Other malaise: Secondary | ICD-10-CM | POA: Diagnosis not present

## 2023-08-07 DIAGNOSIS — S72142D Displaced intertrochanteric fracture of left femur, subsequent encounter for closed fracture with routine healing: Secondary | ICD-10-CM | POA: Diagnosis not present

## 2023-08-07 DIAGNOSIS — E782 Mixed hyperlipidemia: Secondary | ICD-10-CM | POA: Diagnosis not present

## 2023-08-07 DIAGNOSIS — I350 Nonrheumatic aortic (valve) stenosis: Secondary | ICD-10-CM | POA: Diagnosis not present

## 2023-08-07 DIAGNOSIS — I6523 Occlusion and stenosis of bilateral carotid arteries: Secondary | ICD-10-CM | POA: Diagnosis not present

## 2023-08-07 DIAGNOSIS — R29898 Other symptoms and signs involving the musculoskeletal system: Secondary | ICD-10-CM | POA: Diagnosis not present

## 2023-08-07 DIAGNOSIS — I110 Hypertensive heart disease with heart failure: Secondary | ICD-10-CM | POA: Diagnosis not present

## 2023-08-07 DIAGNOSIS — E119 Type 2 diabetes mellitus without complications: Secondary | ICD-10-CM | POA: Diagnosis not present

## 2023-08-08 DIAGNOSIS — E782 Mixed hyperlipidemia: Secondary | ICD-10-CM | POA: Diagnosis not present

## 2023-08-08 DIAGNOSIS — I110 Hypertensive heart disease with heart failure: Secondary | ICD-10-CM | POA: Diagnosis not present

## 2023-08-08 DIAGNOSIS — E119 Type 2 diabetes mellitus without complications: Secondary | ICD-10-CM | POA: Diagnosis not present

## 2023-08-08 DIAGNOSIS — R29898 Other symptoms and signs involving the musculoskeletal system: Secondary | ICD-10-CM | POA: Diagnosis not present

## 2023-08-08 DIAGNOSIS — I6523 Occlusion and stenosis of bilateral carotid arteries: Secondary | ICD-10-CM | POA: Diagnosis not present

## 2023-08-08 DIAGNOSIS — I350 Nonrheumatic aortic (valve) stenosis: Secondary | ICD-10-CM | POA: Diagnosis not present

## 2023-08-08 DIAGNOSIS — S72142D Displaced intertrochanteric fracture of left femur, subsequent encounter for closed fracture with routine healing: Secondary | ICD-10-CM | POA: Diagnosis not present

## 2023-08-08 DIAGNOSIS — R5381 Other malaise: Secondary | ICD-10-CM | POA: Diagnosis not present

## 2023-08-08 DIAGNOSIS — I5032 Chronic diastolic (congestive) heart failure: Secondary | ICD-10-CM | POA: Diagnosis not present

## 2023-08-09 DIAGNOSIS — I110 Hypertensive heart disease with heart failure: Secondary | ICD-10-CM | POA: Diagnosis not present

## 2023-08-09 DIAGNOSIS — I6523 Occlusion and stenosis of bilateral carotid arteries: Secondary | ICD-10-CM | POA: Diagnosis not present

## 2023-08-09 DIAGNOSIS — R5381 Other malaise: Secondary | ICD-10-CM | POA: Diagnosis not present

## 2023-08-09 DIAGNOSIS — I5032 Chronic diastolic (congestive) heart failure: Secondary | ICD-10-CM | POA: Diagnosis not present

## 2023-08-09 DIAGNOSIS — E119 Type 2 diabetes mellitus without complications: Secondary | ICD-10-CM | POA: Diagnosis not present

## 2023-08-09 DIAGNOSIS — S72142D Displaced intertrochanteric fracture of left femur, subsequent encounter for closed fracture with routine healing: Secondary | ICD-10-CM | POA: Diagnosis not present

## 2023-08-09 DIAGNOSIS — E782 Mixed hyperlipidemia: Secondary | ICD-10-CM | POA: Diagnosis not present

## 2023-08-09 DIAGNOSIS — R29898 Other symptoms and signs involving the musculoskeletal system: Secondary | ICD-10-CM | POA: Diagnosis not present

## 2023-08-09 DIAGNOSIS — I350 Nonrheumatic aortic (valve) stenosis: Secondary | ICD-10-CM | POA: Diagnosis not present

## 2023-08-10 DIAGNOSIS — I6523 Occlusion and stenosis of bilateral carotid arteries: Secondary | ICD-10-CM | POA: Diagnosis not present

## 2023-08-10 DIAGNOSIS — R5381 Other malaise: Secondary | ICD-10-CM | POA: Diagnosis not present

## 2023-08-10 DIAGNOSIS — S72142D Displaced intertrochanteric fracture of left femur, subsequent encounter for closed fracture with routine healing: Secondary | ICD-10-CM | POA: Diagnosis not present

## 2023-08-10 DIAGNOSIS — I110 Hypertensive heart disease with heart failure: Secondary | ICD-10-CM | POA: Diagnosis not present

## 2023-08-10 DIAGNOSIS — I350 Nonrheumatic aortic (valve) stenosis: Secondary | ICD-10-CM | POA: Diagnosis not present

## 2023-08-10 DIAGNOSIS — R29898 Other symptoms and signs involving the musculoskeletal system: Secondary | ICD-10-CM | POA: Diagnosis not present

## 2023-08-10 DIAGNOSIS — E119 Type 2 diabetes mellitus without complications: Secondary | ICD-10-CM | POA: Diagnosis not present

## 2023-08-10 DIAGNOSIS — I5032 Chronic diastolic (congestive) heart failure: Secondary | ICD-10-CM | POA: Diagnosis not present

## 2023-08-10 DIAGNOSIS — E782 Mixed hyperlipidemia: Secondary | ICD-10-CM | POA: Diagnosis not present

## 2023-08-11 ENCOUNTER — Emergency Department (HOSPITAL_COMMUNITY): Payer: Medicare PPO

## 2023-08-11 ENCOUNTER — Inpatient Hospital Stay (HOSPITAL_COMMUNITY)
Admission: EM | Admit: 2023-08-11 | Discharge: 2023-08-18 | DRG: 177 | Disposition: A | Discharge status: Home Health | Payer: Medicare PPO | Attending: Internal Medicine | Admitting: Internal Medicine

## 2023-08-11 DIAGNOSIS — R569 Unspecified convulsions: Secondary | ICD-10-CM | POA: Diagnosis not present

## 2023-08-11 DIAGNOSIS — I110 Hypertensive heart disease with heart failure: Secondary | ICD-10-CM | POA: Diagnosis present

## 2023-08-11 DIAGNOSIS — Z515 Encounter for palliative care: Secondary | ICD-10-CM | POA: Diagnosis not present

## 2023-08-11 DIAGNOSIS — J189 Pneumonia, unspecified organism: Secondary | ICD-10-CM | POA: Diagnosis not present

## 2023-08-11 DIAGNOSIS — E119 Type 2 diabetes mellitus without complications: Secondary | ICD-10-CM | POA: Diagnosis present

## 2023-08-11 DIAGNOSIS — R400 Somnolence: Secondary | ICD-10-CM | POA: Diagnosis not present

## 2023-08-11 DIAGNOSIS — Z8673 Personal history of transient ischemic attack (TIA), and cerebral infarction without residual deficits: Secondary | ICD-10-CM

## 2023-08-11 DIAGNOSIS — A419 Sepsis, unspecified organism: Secondary | ICD-10-CM

## 2023-08-11 DIAGNOSIS — Z66 Do not resuscitate: Secondary | ICD-10-CM | POA: Diagnosis present

## 2023-08-11 DIAGNOSIS — J9811 Atelectasis: Secondary | ICD-10-CM | POA: Diagnosis not present

## 2023-08-11 DIAGNOSIS — F0390 Unspecified dementia without behavioral disturbance: Secondary | ICD-10-CM | POA: Diagnosis present

## 2023-08-11 DIAGNOSIS — R609 Edema, unspecified: Secondary | ICD-10-CM | POA: Diagnosis not present

## 2023-08-11 DIAGNOSIS — Z7982 Long term (current) use of aspirin: Secondary | ICD-10-CM | POA: Diagnosis not present

## 2023-08-11 DIAGNOSIS — F02C0 Dementia in other diseases classified elsewhere, severe, without behavioral disturbance, psychotic disturbance, mood disturbance, and anxiety: Secondary | ICD-10-CM | POA: Diagnosis present

## 2023-08-11 DIAGNOSIS — J690 Pneumonitis due to inhalation of food and vomit: Principal | ICD-10-CM | POA: Diagnosis present

## 2023-08-11 DIAGNOSIS — R29810 Facial weakness: Secondary | ICD-10-CM | POA: Diagnosis not present

## 2023-08-11 DIAGNOSIS — R627 Adult failure to thrive: Secondary | ICD-10-CM | POA: Diagnosis present

## 2023-08-11 DIAGNOSIS — R918 Other nonspecific abnormal finding of lung field: Secondary | ICD-10-CM | POA: Diagnosis not present

## 2023-08-11 DIAGNOSIS — I6523 Occlusion and stenosis of bilateral carotid arteries: Secondary | ICD-10-CM | POA: Diagnosis not present

## 2023-08-11 DIAGNOSIS — E860 Dehydration: Secondary | ICD-10-CM | POA: Diagnosis present

## 2023-08-11 DIAGNOSIS — I6782 Cerebral ischemia: Secondary | ICD-10-CM | POA: Diagnosis not present

## 2023-08-11 DIAGNOSIS — R6520 Severe sepsis without septic shock: Secondary | ICD-10-CM | POA: Diagnosis not present

## 2023-08-11 DIAGNOSIS — E876 Hypokalemia: Secondary | ICD-10-CM | POA: Diagnosis present

## 2023-08-11 DIAGNOSIS — R4182 Altered mental status, unspecified: Secondary | ICD-10-CM

## 2023-08-11 DIAGNOSIS — Z1152 Encounter for screening for COVID-19: Secondary | ICD-10-CM | POA: Diagnosis not present

## 2023-08-11 DIAGNOSIS — R0989 Other specified symptoms and signs involving the circulatory and respiratory systems: Secondary | ICD-10-CM | POA: Diagnosis not present

## 2023-08-11 DIAGNOSIS — I4719 Other supraventricular tachycardia: Secondary | ICD-10-CM | POA: Diagnosis not present

## 2023-08-11 DIAGNOSIS — Z7401 Bed confinement status: Secondary | ICD-10-CM | POA: Diagnosis not present

## 2023-08-11 DIAGNOSIS — E041 Nontoxic single thyroid nodule: Secondary | ICD-10-CM | POA: Diagnosis present

## 2023-08-11 DIAGNOSIS — R531 Weakness: Secondary | ICD-10-CM | POA: Diagnosis not present

## 2023-08-11 DIAGNOSIS — E785 Hyperlipidemia, unspecified: Secondary | ICD-10-CM | POA: Diagnosis present

## 2023-08-11 DIAGNOSIS — F03C0 Unspecified dementia, severe, without behavioral disturbance, psychotic disturbance, mood disturbance, and anxiety: Secondary | ICD-10-CM | POA: Diagnosis not present

## 2023-08-11 DIAGNOSIS — Z952 Presence of prosthetic heart valve: Secondary | ICD-10-CM | POA: Diagnosis not present

## 2023-08-11 DIAGNOSIS — Z79899 Other long term (current) drug therapy: Secondary | ICD-10-CM

## 2023-08-11 DIAGNOSIS — I5032 Chronic diastolic (congestive) heart failure: Secondary | ICD-10-CM | POA: Diagnosis present

## 2023-08-11 DIAGNOSIS — J90 Pleural effusion, not elsewhere classified: Secondary | ICD-10-CM | POA: Diagnosis not present

## 2023-08-11 DIAGNOSIS — R5381 Other malaise: Secondary | ICD-10-CM | POA: Diagnosis present

## 2023-08-11 DIAGNOSIS — I471 Supraventricular tachycardia, unspecified: Secondary | ICD-10-CM | POA: Diagnosis present

## 2023-08-11 DIAGNOSIS — D352 Benign neoplasm of pituitary gland: Secondary | ICD-10-CM | POA: Diagnosis not present

## 2023-08-11 DIAGNOSIS — R0602 Shortness of breath: Secondary | ICD-10-CM | POA: Diagnosis not present

## 2023-08-11 DIAGNOSIS — Z7189 Other specified counseling: Secondary | ICD-10-CM | POA: Diagnosis not present

## 2023-08-11 DIAGNOSIS — G40909 Epilepsy, unspecified, not intractable, without status epilepticus: Secondary | ICD-10-CM | POA: Diagnosis present

## 2023-08-11 DIAGNOSIS — G928 Other toxic encephalopathy: Secondary | ICD-10-CM | POA: Diagnosis present

## 2023-08-11 DIAGNOSIS — I639 Cerebral infarction, unspecified: Secondary | ICD-10-CM | POA: Diagnosis not present

## 2023-08-11 DIAGNOSIS — I447 Left bundle-branch block, unspecified: Secondary | ICD-10-CM | POA: Diagnosis not present

## 2023-08-11 DIAGNOSIS — M6281 Muscle weakness (generalized): Secondary | ICD-10-CM | POA: Diagnosis not present

## 2023-08-11 DIAGNOSIS — J984 Other disorders of lung: Secondary | ICD-10-CM | POA: Diagnosis not present

## 2023-08-11 HISTORY — DX: Altered mental status, unspecified: R41.82

## 2023-08-11 LAB — URINALYSIS, ROUTINE W REFLEX MICROSCOPIC
Bacteria, UA: NONE SEEN
Glucose, UA: NEGATIVE mg/dL
Hgb urine dipstick: NEGATIVE
Ketones, ur: 5 mg/dL — AB
Leukocytes,Ua: NEGATIVE
Nitrite: NEGATIVE
Protein, ur: 30 mg/dL — AB
Specific Gravity, Urine: 1.035 — ABNORMAL HIGH (ref 1.005–1.030)
pH: 5 (ref 5.0–8.0)

## 2023-08-11 LAB — I-STAT CHEM 8, ED
BUN: 23 mg/dL (ref 8–23)
Calcium, Ion: 1.04 mmol/L — ABNORMAL LOW (ref 1.15–1.40)
Chloride: 106 mmol/L (ref 98–111)
Creatinine, Ser: 0.5 mg/dL (ref 0.44–1.00)
Glucose, Bld: 102 mg/dL — ABNORMAL HIGH (ref 70–99)
HCT: 36 % (ref 36.0–46.0)
Hemoglobin: 12.2 g/dL (ref 12.0–15.0)
Potassium: 3.1 mmol/L — ABNORMAL LOW (ref 3.5–5.1)
Sodium: 146 mmol/L — ABNORMAL HIGH (ref 135–145)
TCO2: 27 mmol/L (ref 22–32)

## 2023-08-11 LAB — CBC
HCT: 39 % (ref 36.0–46.0)
Hemoglobin: 12.1 g/dL (ref 12.0–15.0)
MCH: 30.1 pg (ref 26.0–34.0)
MCHC: 31 g/dL (ref 30.0–36.0)
MCV: 97 fL (ref 80.0–100.0)
Platelets: 237 10*3/uL (ref 150–400)
RBC: 4.02 MIL/uL (ref 3.87–5.11)
RDW: 17.6 % — ABNORMAL HIGH (ref 11.5–15.5)
WBC: 20.2 10*3/uL — ABNORMAL HIGH (ref 4.0–10.5)
nRBC: 0.1 % (ref 0.0–0.2)

## 2023-08-11 LAB — RAPID URINE DRUG SCREEN, HOSP PERFORMED
Amphetamines: NOT DETECTED
Barbiturates: NOT DETECTED
Benzodiazepines: NOT DETECTED
Cocaine: NOT DETECTED
Opiates: NOT DETECTED
Tetrahydrocannabinol: NOT DETECTED

## 2023-08-11 LAB — COMPREHENSIVE METABOLIC PANEL
ALT: 17 U/L (ref 0–44)
AST: 20 U/L (ref 15–41)
Albumin: 2.4 g/dL — ABNORMAL LOW (ref 3.5–5.0)
Alkaline Phosphatase: 53 U/L (ref 38–126)
Anion gap: 13 (ref 5–15)
BUN: 22 mg/dL (ref 8–23)
CO2: 26 mmol/L (ref 22–32)
Calcium: 8 mg/dL — ABNORMAL LOW (ref 8.9–10.3)
Chloride: 105 mmol/L (ref 98–111)
Creatinine, Ser: 0.41 mg/dL — ABNORMAL LOW (ref 0.44–1.00)
GFR, Estimated: >60 mL/min (ref >60)
Glucose, Bld: 100 mg/dL — ABNORMAL HIGH (ref 70–99)
Potassium: 3.4 mmol/L — ABNORMAL LOW (ref 3.5–5.1)
Sodium: 144 mmol/L (ref 135–145)
Total Bilirubin: 1.3 mg/dL — ABNORMAL HIGH (ref 0.3–1.2)
Total Protein: 5.7 g/dL — ABNORMAL LOW (ref 6.5–8.1)

## 2023-08-11 LAB — PROTIME-INR
INR: 1.1 (ref 0.8–1.2)
Prothrombin Time: 13.9 seconds (ref 11.4–15.2)

## 2023-08-11 LAB — GLUCOSE, CAPILLARY: Glucose-Capillary: 95 mg/dL (ref 70–99)

## 2023-08-11 LAB — DIFFERENTIAL
Abs Immature Granulocytes: 0.66 10*3/uL — ABNORMAL HIGH (ref 0.00–0.07)
Basophils Absolute: 0.1 10*3/uL (ref 0.0–0.1)
Basophils Relative: 1 %
Eosinophils Absolute: 0 10*3/uL (ref 0.0–0.5)
Eosinophils Relative: 0 %
Immature Granulocytes: 3 %
Lymphocytes Relative: 16 %
Lymphs Abs: 3.1 10*3/uL (ref 0.7–4.0)
Monocytes Absolute: 1.5 10*3/uL — ABNORMAL HIGH (ref 0.1–1.0)
Monocytes Relative: 8 %
Neutro Abs: 14.7 10*3/uL — ABNORMAL HIGH (ref 1.7–7.7)
Neutrophils Relative %: 72 %

## 2023-08-11 LAB — MAGNESIUM: Magnesium: 1.9 mg/dL (ref 1.7–2.4)

## 2023-08-11 LAB — ETHANOL: Alcohol, Ethyl (B): <10 mg/dL (ref <10)

## 2023-08-11 LAB — I-STAT CG4 LACTIC ACID, ED
Lactic Acid, Venous: 1.8 mmol/L (ref 0.5–1.9)
Lactic Acid, Venous: 0.8 mmol/L (ref 0.5–1.9)

## 2023-08-11 LAB — HEMOGLOBIN A1C
Hgb A1c MFr Bld: 5 % (ref 4.8–5.6)
Mean Plasma Glucose: 96.8 mg/dL

## 2023-08-11 LAB — CBG MONITORING, ED
Glucose-Capillary: 84 mg/dL (ref 70–99)
Glucose-Capillary: 98 mg/dL (ref 70–99)

## 2023-08-11 LAB — APTT: aPTT: 27 seconds (ref 24–36)

## 2023-08-11 LAB — TROPONIN I (HIGH SENSITIVITY)
Troponin I (High Sensitivity): 29 ng/L — ABNORMAL HIGH (ref <18)
Troponin I (High Sensitivity): 31 ng/L — ABNORMAL HIGH (ref <18)

## 2023-08-11 LAB — PROCALCITONIN: Procalcitonin: <0.1 ng/mL

## 2023-08-11 LAB — SARS CORONAVIRUS 2 BY RT PCR: SARS Coronavirus 2 by RT PCR: NEGATIVE

## 2023-08-11 MED ORDER — POTASSIUM CHLORIDE IN NACL 20-0.9 MEQ/L-% IV SOLN
INTRAVENOUS | Status: DC
  Filled 2023-08-11: qty 1000

## 2023-08-11 MED ORDER — LACTATED RINGERS IV SOLN: INTRAVENOUS | Status: DC

## 2023-08-11 MED ORDER — SODIUM CHLORIDE 0.9 % IV SOLN
500.0000 mg | INTRAVENOUS | Status: AC
  Administered 2023-08-11 – 2023-08-15 (×5): 500 mg via INTRAVENOUS
  Filled 2023-08-11 (×5): qty 5

## 2023-08-11 MED ORDER — SODIUM CHLORIDE 0.9 % IV SOLN
2.0000 g | INTRAVENOUS | Status: DC
  Administered 2023-08-11: 2 g via INTRAVENOUS
  Filled 2023-08-11: qty 20

## 2023-08-11 MED ORDER — METOPROLOL TARTRATE 12.5 MG HALF TABLET
12.5000 mg | ORAL_TABLET | Freq: Two times a day (BID) | ORAL | Status: DC
  Administered 2023-08-12 – 2023-08-17 (×9): 12.5 mg via ORAL
  Filled 2023-08-11 (×12): qty 1

## 2023-08-11 MED ORDER — LISINOPRIL 5 MG PO TABS: 10.0000 mg | ORAL_TABLET | Freq: Every day | ORAL | Status: DC

## 2023-08-11 MED ORDER — INSULIN ASPART 100 UNIT/ML IJ SOLN
0.0000 [IU] | INTRAMUSCULAR | Status: DC
  Administered 2023-08-12: 1 [IU] via SUBCUTANEOUS
  Administered 2023-08-14: 3 [IU] via SUBCUTANEOUS
  Administered 2023-08-14 – 2023-08-15 (×2): 2 [IU] via SUBCUTANEOUS
  Administered 2023-08-15 (×2): 3 [IU] via SUBCUTANEOUS
  Administered 2023-08-15: 1 [IU] via SUBCUTANEOUS
  Administered 2023-08-16: 2 [IU] via SUBCUTANEOUS
  Administered 2023-08-16: 1 [IU] via SUBCUTANEOUS
  Administered 2023-08-16: 2 [IU] via SUBCUTANEOUS
  Administered 2023-08-16: 1 [IU] via SUBCUTANEOUS
  Administered 2023-08-16 – 2023-08-17 (×3): 2 [IU] via SUBCUTANEOUS
  Administered 2023-08-18: 1 [IU] via SUBCUTANEOUS

## 2023-08-11 MED ORDER — ASPIRIN 81 MG PO TBEC
81.0000 mg | DELAYED_RELEASE_TABLET | Freq: Every day | ORAL | Status: DC
  Administered 2023-08-12 – 2023-08-18 (×7): 81 mg via ORAL
  Filled 2023-08-11 (×7): qty 1

## 2023-08-11 MED ORDER — ENOXAPARIN SODIUM 40 MG/0.4ML IJ SOSY
40.0000 mg | PREFILLED_SYRINGE | INTRAMUSCULAR | Status: DC
  Administered 2023-08-12 – 2023-08-18 (×7): 40 mg via SUBCUTANEOUS
  Filled 2023-08-11 (×7): qty 0.4

## 2023-08-11 MED ORDER — DIVALPROEX SODIUM 250 MG PO DR TAB
250.0000 mg | DELAYED_RELEASE_TABLET | Freq: Every day | ORAL | Status: DC
  Administered 2023-08-12 – 2023-08-17 (×6): 250 mg via ORAL
  Filled 2023-08-11 (×7): qty 1

## 2023-08-11 MED ORDER — ATORVASTATIN CALCIUM 40 MG PO TABS
40.0000 mg | ORAL_TABLET | Freq: Every day | ORAL | Status: DC
  Administered 2023-08-12 – 2023-08-18 (×7): 40 mg via ORAL
  Filled 2023-08-11 (×7): qty 1

## 2023-08-11 MED ORDER — IOHEXOL 350 MG/ML SOLN
50.0000 mL | Freq: Once | INTRAVENOUS | Status: AC | PRN
  Administered 2023-08-11: 50 mL via INTRAVENOUS

## 2023-08-11 MED ORDER — POTASSIUM CHLORIDE 10 MEQ/100ML IV SOLN
10.0000 meq | INTRAVENOUS | Status: AC
  Administered 2023-08-11 – 2023-08-12 (×4): 10 meq via INTRAVENOUS
  Filled 2023-08-11 (×4): qty 100

## 2023-08-11 MED ORDER — LEVOFLOXACIN IN D5W 750 MG/150ML IV SOLN: 750.0000 mg | Freq: Once | INTRAVENOUS | Status: DC

## 2023-08-11 MED ORDER — SODIUM CHLORIDE 0.9 % IV SOLN: Freq: Once | INTRAVENOUS | Status: AC

## 2023-08-11 MED ORDER — POTASSIUM CHLORIDE IN NACL 20-0.9 MEQ/L-% IV SOLN
INTRAVENOUS | Status: DC
Start: 2023-08-12 — End: 2023-08-11

## 2023-08-11 NOTE — H&P (Signed)
History and Physical  Christine Newton ONG:[id number] DOB: 1933/12/15 DOA: 08/11/2023  Referring physician: ER provider PCP: Blane Ohara, MD  Outpatient Specialists:    Patient coming from: Home.  Patient lives with daughter.  Chief Complaint: Concerns for stroke and altered mental status.  HPI: Patient is an 89 year old African-American female with past medical history significant for dementia, hypertension, TIA, diabetes mellitus, diastolic congestive heart failure, seizures, syncope, status post aortic valve replacement and LVH.  Patient was noted to have decreased responsiveness earlier today, with reported right-sided weakness, confusion and lethargy.  Patient is also reported to have dry cough, increased nasal congestion.  On presentation, code stroke was activated.  MRI of the brain did not reveal any acute findings.  CT of the head did not reveal any acute findings.  Chest x-ray revealed bilateral lower lobe atelectasis.  CT scan of the chest revealed patchy airspace disease involving the left upper lobe of the lung.  Patient has chronic leukocytosis, with WBC of 20,000 noted on presentation.  Code sepsis was activated.  Hospitalist team was called to admit patient for further evaluation and management.  Due to dementing illness and altered mentation, patient could not and will not give any significant history.  Patient is mainly bedbound at home.  Patient is able to feed herself occasionally.  Most of the history have come from prior documentation and patient's daughter.  Other lab work done revealed sodium of 144, potassium of 3.4 but dropped to 3.1, chloride 105, CO2 26, BUN of 22, serum creatinine of 0.41, blood sugar of 100, albumin of 2.4, total protein of 5.7, total bilirubin of 1.3, WBC of 20.2, hemoglobin of 12.1, hematocrit of 39, platelet count of 237 and MCV of 97.  UA revealed specific gravity of 1.035 and ketone of 5.  Toxicology came back negative.  EKG revealed left bundle  branch block.   ED Course: As documented in the above, code stroke was initially activated.  Code sepsis was sought evaluated.  Patient has been pancultured, volume resuscitated and started on IV antibiotics.  Hospitalist team has been called to admit patient.  Neurology team (code stroke team) already consulted.  Pertinent labs: As documented above.  EKG: Independently reviewed.  Left bundle branch block.  Imaging: independently reviewed.   Review of Systems:  Unobtainable.  Past Medical History:  Diagnosis Date   Closed fracture of left inferior and superior pubic ramus (HCC) 01/19/2022   Diabetes (HCC)    Hypertension    Nonrheumatic aortic (valve) stenosis 09/21/2020    Past Surgical History:  Procedure Laterality Date   CARDIAC VALVE REPLACEMENT     CHOLECYSTECTOMY     INTRAMEDULLARY (IM) NAIL INTERTROCHANTERIC Left 10/02/2022   Procedure: INTRAMEDULLARY (IM) NAIL INTERTROCHANTERIC;  Surgeon: Huel Cote, MD;  Location: [facility] OR;  Service: Orthopedics;  Laterality: Left;     reports that she has never smoked. She has never used smokeless tobacco. She reports that she does not drink alcohol and does not use drugs.  Allergies  Allergen Reactions   Cephalosporins Nausea Only    No family history on file.   Prior to Admission medications   Medication Sig Start Date End Date Taking? Authorizing Provider  aspirin EC 81 MG tablet Take 1 tablet (81 mg total) by mouth daily. Swallow whole. Patient taking differently: Take 81 mg by mouth daily. 01/21/21   Ihor Austin, NP  atorvastatin (LIPITOR) 40 MG tablet TAKE 1 TABLET BY MOUTH EVERY DAY 07/17/23   Blane Ohara, MD  Carboxymethylcellulose Sodium (DRY EYE RELIEF OP) Place 1 drop into both eyes daily as needed (dry eyes). Patient not taking: Reported on 06/16/2023    [provider]  divalproex (DEPAKOTE) 250 MG DR tablet TAKE 1 TABLET BY MOUTH TWICE A DAY Patient taking differently: Take 250 mg by mouth daily.  04/23/23   [name], Fritzi Mandes, MD  lisinopril (ZESTRIL) 10 MG tablet TAKE 1 TABLET BY MOUTH EVERY DAY 05/02/23   [name], Fritzi Mandes, MD  metoprolol tartrate (LOPRESSOR) 25 MG tablet TAKE 1/2 TABLET TWICE A DAY BY MOUTH 07/21/23   Blane Ohara, MD    Physical Exam: Vitals:   08/11/23 1600 08/11/23 1630 08/11/23 1700 08/11/23 1900  BP: (!) 189/116 (!) 144/85 (!) 173/83 (!) 155/81  Pulse: (!) 135 92 88 87  Resp: (!) 26 15 18  (!) 27  Temp:      TempSrc:      SpO2: 99% 98% 97% 100%  Weight:      Height:         Constitutional:  Appears calm and comfortable.  Patient looks chronically ill. Eyes:  Patient is pale.  No jaundice.    ENMT:  external ears, nose appear normal Neck:  Neck is supple. No JVD Respiratory:  Decreased air entry globally.   Cardiovascular:  S1S2, systolic murmur, query diastolic murmur with no added sound. Significant edema of the left upper extremity.  Daughter tells me that this is new and just happened since patient presented to the hospital.  Lower lower extremity edema.    Abdomen:  Abdomen is obese, soft and non tender. Organs are difficult to assess. Neurologic:  Awake and alert. Patient seems to move all extremities.    Wt Readings from Last 3 Encounters:  08/11/23 63 kg  06/16/23 63.5 kg  01/31/23 65.8 kg    I have personally reviewed following labs and imaging studies  Labs on Admission:  CBC: Recent Labs  Lab 08/11/23 0947 08/11/23 0950  WBC 20.2*  --   NEUTROABS 14.7*  --   HGB 12.1 12.2  HCT 39.0 36.0  MCV 97.0  --   PLT 237  --    Basic Metabolic Panel: Recent Labs  Lab 08/11/23 0947 08/11/23 0950  NA 144 146*  K 3.4* 3.1*  CL 105 106  CO2 26  --   GLUCOSE 100* 102*  BUN 22 23  CREATININE 0.41* 0.50  CALCIUM 8.0*  --    Liver Function Tests: Recent Labs  Lab 08/11/23 0947  AST 20  ALT 17  ALKPHOS 53  BILITOT 1.3*  PROT 5.7*  ALBUMIN 2.4*   No results for input(s): "LIPASE", "AMYLASE" in the last 168 hours. No results  for input(s): "AMMONIA" in the last 168 hours. Coagulation Profile: Recent Labs  Lab 08/11/23 0947  INR 1.1   Cardiac Enzymes: No results for input(s): "CKTOTAL", "CKMB", "CKMBINDEX", "TROPONINI" in the last 168 hours. BNP (last 3 results) No results for input(s): "PROBNP" in the last 8760 hours. HbA1C: No results for input(s): "HGBA1C" in the last 72 hours. CBG: Recent Labs  Lab 08/11/23 0944  GLUCAP 84   Lipid Profile: No results for input(s): "CHOL", "HDL", "LDLCALC", "TRIG", "CHOLHDL", "LDLDIRECT" in the last 72 hours. Thyroid Function Tests: No results for input(s): "TSH", "T4TOTAL", "FREET4", "T3FREE", "THYROIDAB" in the last 72 hours. Anemia Panel: No results for input(s): "VITAMINB12", "FOLATE", "FERRITIN", "TIBC", "IRON", "RETICCTPCT" in the last 72 hours. Urine analysis:    Component Value Date/Time   COLORURINE AMBER (A) 08/11/2023 1146  APPEARANCEUR HAZY (A) 08/11/2023 1146   LABSPEC 1.035 (H) 08/11/2023 1146   PHURINE 5.0 08/11/2023 1146   GLUCOSEU NEGATIVE 08/11/2023 1146   HGBUR NEGATIVE 08/11/2023 1146   BILIRUBINUR SMALL (A) 08/11/2023 1146   BILIRUBINUR negative 03/01/2022 1206   BILIRUBINUR Negative 11/01/2021 1152   KETONESUR 5 (A) 08/11/2023 1146   PROTEINUR 30 (A) 08/11/2023 1146   UROBILINOGEN 1.0 03/01/2022 1206   NITRITE NEGATIVE 08/11/2023 1146   LEUKOCYTESUR NEGATIVE 08/11/2023 1146   Sepsis Labs: @LABRCNTIP (procalcitonin:4,lacticidven:4) ) Recent Results (from the past 240 hour(s))  SARS Coronavirus 2 by RT PCR (hospital order, performed in [facility] hospital lab) *cepheid single result test* Anterior Nasal Swab     Status: None   Collection Time: 08/11/23  1:10 PM   Specimen: Anterior Nasal Swab  Result Value Ref Range Status   SARS Coronavirus 2 by RT PCR NEGATIVE NEGATIVE Final    Comment: Performed at [facility], [address], [place], [place] [address]      Radiological Exams on Admission: CT CHEST WO  CONTRAST  Result Date: 08/11/2023 CLINICAL DATA:  Respiratory illness EXAM: CT CHEST WITHOUT CONTRAST TECHNIQUE: Multidetector CT imaging of the chest was performed following the standard protocol without IV contrast. RADIATION DOSE REDUCTION: This exam was performed according to the departmental dose-optimization program which includes automated exposure control, adjustment of the mA and/or kV according to patient size and/or use of iterative reconstruction technique. COMPARISON:  None Available. FINDINGS: Cardiovascular: No significant vascular findings. Normal heart size. No pericardial effusion. There are atherosclerotic calcifications of the aorta and coronary arteries. Mediastinum/Nodes: Thyroid gland is heterogeneous and enlarged, particularly the left thyroid. Multiple calcifications are present. This causes shift of the trachea to the right proximally 1 cm. Trachea is widely patent. There are no enlarged mediastinal or hilar lymph nodes. Esophagus is unremarkable. Lungs/Pleura: There is atelectasis in the bilateral lower lobes. There is a small amount of patchy airspace disease in the inferior left upper lobe. There is no pneumothorax or pleural effusion. Upper Abdomen: Bilateral renal calculi are present. Musculoskeletal: The bones are osteopenic. T7 compression deformity is age indeterminate (moderate). IMPRESSION: 1. Small amount of patchy airspace disease in the inferior left upper lobe may be infectious/inflammatory. 2. Bilateral lower lobe atelectasis. 3. Incidental right thyroid nodule with heterogeneous and enlarged thyroid measuring 1 cm. Recommend non-emergent thyroid ultrasound if clinically warranted given patient age. Reference: J Am Coll Radiol. 2015 Feb;12(2): 143-50 4. Aortic Atherosclerosis (ICD10-I70.0). Electronically Signed   By: Darliss Cheney M.D.   On: 08/11/2023 17:26   DG Chest 1 View  Result Date: 08/11/2023 CLINICAL DATA:  Altered mental status EXAM: CHEST  1 VIEW COMPARISON:   Chest radiograph dated 01/31/2023 FINDINGS: Low lung volumes. Left basilar patchy opacities. Unchanged blunting of the left costophrenic angle. No pneumothorax. Similar cardiomediastinal silhouette status post valve replacement. No acute osseous abnormality. IMPRESSION: 1. Left basilar patchy opacities, which may represent atelectasis, aspiration, or pneumonia. 2. Unchanged blunting of the left costophrenic angle, which may represent a small pleural effusion. Electronically Signed   By: Agustin Cree M.D.   On: 08/11/2023 15:14   MR BRAIN WO CONTRAST  Result Date: 08/11/2023 CLINICAL DATA:  Sudden onset of left-sided facial droop and right-sided weakness. EXAM: MRI HEAD WITHOUT CONTRAST TECHNIQUE: Multiplanar, multiecho pulse sequences of the brain and surrounding structures were obtained without intravenous contrast. COMPARISON:  Head CT from earlier today FINDINGS: Brain: No acute infarction, hemorrhage, hydrocephalus, extra-axial collection or mass effect. Generalized atrophy,  cortical thinning is advanced. Chronic left superior frontal and parietal cortically based infarcts. Small chronic bilateral cerebellar infarcts. Ischemic gliosis bilaterally in the deep cerebral white matter. There is a lobulated sellar and suprasellar mass indistinguishable from the pituitary gland and involving the left cavernous sinus, up to 2.3 cm on sagittal T1 weighted imaging, most consistent with pituitary adenoma and known from multiple prior studies. The mass appears contracted when compared to 2021. Vascular: Major flow voids are preserved Skull and upper cervical spine: No focal marrow lesion. Sinuses/Orbits: No acute finding Other: Intermittently moderate motion artifact. IMPRESSION: 1. Motion degraded brain MRI without acute finding including infarct. 2. Chronic small vessel disease and remote left frontal and parietal cortex infarcts. Generalized brain atrophy. 3. Known pituitary adenoma with decreased size since 2021.  Electronically Signed   By: Tiburcio Pea M.D.   On: 08/11/2023 13:31   CT HEAD CODE STROKE WO CONTRAST  Result Date: 08/11/2023 CLINICAL DATA:  Code stroke. Neuro deficit, acute, stroke suspected. Left-sided facial droop. Left-sided weakness. EXAM: CT HEAD WITHOUT CONTRAST TECHNIQUE: Contiguous axial images were obtained from the base of the skull through the vertex without intravenous contrast. RADIATION DOSE REDUCTION: This exam was performed according to the departmental dose-optimization program which includes automated exposure control, adjustment of the mA and/or kV according to patient size and/or use of iterative reconstruction technique. COMPARISON:  CT head without contrast 02/02/2023. MR head without contrast 11/18/2022. FINDINGS: Brain: A remote cortical infarct of the left parietal lobe is stable. Remote lacunar infarcts of the caudate head bilaterally are stable. No acute infarct, hemorrhage, or mass lesion is present. The ventricles are proportionate to the degree of atrophy. No significant extraaxial fluid collection is present. Remote lacunar infarcts are again noted in the cerebellum bilaterally. The brainstem is unremarkable. A 1.6 cm sellar and suprasellar mass lesion is again noted. Vascular: Atherosclerotic calcifications are present in the cavernous internal carotid arteries. No hyperdense vessel is present. Skull: Calvarium is intact. No focal lytic or blastic lesions are present. No significant extracranial soft tissue lesion is present. Sinuses/Orbits: The paranasal sinuses and mastoid air cells are clear. Bilateral lens replacements are noted. Globes and orbits are otherwise unremarkable. ASPECTS [facility] Stroke Program Early CT Score) - Ganglionic level infarction (caudate, lentiform nuclei, internal capsule, insula, M1-M3 cortex): 7/7 - Supraganglionic infarction (M4-M6 cortex): 3/3 Total score (0-10 with 10 being normal): 10/10 IMPRESSION: 1. No acute intracranial abnormality or  significant interval change. 2. 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 3. Stable remote lacunar infarcts of the caudate head bilaterally and cerebellum bilaterally. 4. Stable 1.6 cm sellar and suprasellar mass lesion. 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 The above was relayed via text pager to Dr. Selina Cooley on 08/11/2023 at 10:07 . Electronically Signed   By: Marin Roberts M.D.   On: 08/11/2023 10:07    EKG: Independently reviewed.   Principal Problem:   Altered mental status   Assessment/Plan Altered mental status/encephalopathy (likely combined metabolic and toxic): -Admit patient for further evaluation and management. -Stroke team/neurology team has worked up possible acute CVA. -MRI brain has not revealed any acute abnormalities. -CT head has not revealed any abnormalities. -CT chest revealed patchy airspace disease involving the left upper lobe of the lung. -Check procalcitonin. -Continue IV ceftriaxone and azithromycin. -Continue aspirin. -Adequate hydration. -Correct abnormal electrolytes. -Speech evaluation.  Possible pneumonia/possible sepsis/chronic leukocytosis with worsening: -CT chest finding as above. -Check procalcitonin. -Leukocytosis noted. -IV antibiotics (ceftriaxone and azithromycin) -Follow cultures. -Chronic leukocytosis noted,  but worsening. -Aspiration precautions. -Further management will depend on hospital course.  Possible stroke: -Code stroke team input is highly appreciated. -Negative brain imaging so far. -Further management as per neurology team.  Swollen of left upper extremity and left lower extremity: -Patient's daughter tells me that this swollen does have pulm during this admission. -Vascular ultrasound of left lower extremity and left lower extremity. -Keep extremities elevated. -Further management depend on above.  Dementia: -No behavioral problems. -Continue to manage  expectantly.  Hypokalemia: -Monitor and replace. -Check magnesium level. -Adequate hydration.  Hypertension: -Will not be aggressive for now. -Continue to monitor.  Chronic diastolic congestive heart failure: -Seems compensated for now.  Valvular heart disease: -Patient is s/p aortic valve replacement.  Volume depletion: -Urinalysis revealed specific gravity of 1.035 and ketone of 5. -Hydrate patient.   DVT prophylaxis: Subcutaneous Lovenox. Code Status: DO NOT RESUSCITATE. Family Communication: Daughter. Disposition Plan: Inpatient.  Will depend on hospital course. Consults called: ED providers have already involved the neurology team. Admission status: Inpatient.  Time spent: 85 minutes.   Berton Mount, MD  [facility] Pager #: [phone] 7PM-7AM contact night coverage as above  08/11/2023, 7:25 PM

## 2023-08-11 NOTE — Sepsis Progress Note (Signed)
Notified provider of need to order lactic acid. ° °

## 2023-08-11 NOTE — ED Notes (Signed)
1st lac 1.8 at 1731 in normal range, 2nd not needed

## 2023-08-11 NOTE — ED Notes (Signed)
Patient transported to MRI 

## 2023-08-11 NOTE — ED Notes (Signed)
Per CT, the Rad doc stated that the contrast from latest scan is in her left arm so we assume the IV is infiltrated.  It will be removed and an order placed for IV team.  EDP is aware of infiltration and delay to ABX and fluids

## 2023-08-11 NOTE — ED Triage Notes (Signed)
Pt BIB [facility] for Code Stroke.  LKW 0645 when pt initially woke up for daughter.  Went back to sleep and was woken at 0830 by aide.  The aide called daughter to say that her smile was off and she was struggling to swallow well.   EMS endorsed lean to left, left facial droop with rt side weakness.. Alert to self.  140/78 96% 76 CBG 89

## 2023-08-11 NOTE — ED Notes (Signed)
ED TO INPATIENT HANDOFF REPORT  ED Nurse Name and Phone #:  Lucious Groves [phone]  S Name/Age/Gender Christine Newton 89 y.o. female Room/Bed: RESUSC/RESUSC  Code Status   Code Status: Prior  Home/SNF/Other Home Patient oriented to: self, place, time, and situation Is this baseline? Yes   Triage Complete: Triage complete  Chief Complaint CODE STROKE  Triage Note Pt BIB [facility] for Code Stroke.  LKW 0645 when pt initially woke up for daughter.  Went back to sleep and was woken at 0830 by aide.  The aide called daughter to say that her smile was off and she was struggling to swallow well.   EMS endorsed lean to left, left facial droop with rt side weakness.. Alert to self.  140/78 96% 76 CBG 89   Allergies Allergies  Allergen Reactions   Cephalosporins Nausea Only    Level of Care/Admitting Diagnosis ED Disposition     ED Disposition  Admit   Condition  --   Comment  The patient appears reasonably stabilized for admission considering the current resources, flow, and capabilities available in the ED at this time, and I doubt any other [facility] requiring further screening and/or treatment in the ED prior to admission is  present.          B Medical/Surgery History Past Medical History:  Diagnosis Date   Closed fracture of left inferior and superior pubic ramus (HCC) 01/19/2022   Diabetes (HCC)    Hypertension    Nonrheumatic aortic (valve) stenosis 09/21/2020   Past Surgical History:  Procedure Laterality Date   CARDIAC VALVE REPLACEMENT     CHOLECYSTECTOMY     INTRAMEDULLARY (IM) NAIL INTERTROCHANTERIC Left 10/02/2022   Procedure: INTRAMEDULLARY (IM) NAIL INTERTROCHANTERIC;  Surgeon: Huel Cote, MD;  Location: [facility] OR;  Service: Orthopedics;  Laterality: Left;     A IV Location/Drains/Wounds Patient Lines/Drains/Airways Status     Active Line/Drains/Airways     None            Intake/Output Last 24 hours  Intake/Output Summary (Last 24  hours) at 08/11/2023 1919 Last data filed at 08/11/2023 1145 Gross per 24 hour  Intake --  Output 150 ml  Net -150 ml    Labs/Imaging Results for orders placed or performed during the hospital encounter of 08/11/23 (from the past 48 hour(s))  CBG monitoring, ED     Status: None   Collection Time: 08/11/23  9:44 AM  Result Value Ref Range   Glucose-Capillary 84 70 - 99 mg/dL    Comment: Glucose reference range applies only to samples taken after fasting for at least 8 hours.  Ethanol     Status: None   Collection Time: 08/11/23  9:47 AM  Result Value Ref Range   Alcohol, Ethyl (B) <10 <10 mg/dL    Comment: (NOTE) Lowest detectable limit for serum alcohol is 10 mg/dL.  For medical purposes only. Performed at [facility], [address] [place], [place] [address]   Protime-INR     Status: None   Collection Time: 08/11/23  9:47 AM  Result Value Ref Range   Prothrombin Time 13.9 11.4 - 15.2 seconds   INR 1.1 0.8 - 1.2    Comment: (NOTE) INR goal varies based on device and disease states. Performed at [facility], [address] [place], [place] [address]   APTT     Status: None   Collection Time: 08/11/23  9:47 AM  Result Value Ref Range  aPTT 27 24 - 36 seconds    Comment: Performed at [facility], [address] [place], [place] [address]  CBC     Status: Abnormal   Collection Time: 08/11/23  9:47 AM  Result Value Ref Range   WBC 20.2 (H) 4.0 - 10.5 K/uL   RBC 4.02 3.87 - 5.11 MIL/uL   Hemoglobin 12.1 12.0 - 15.0 g/dL   HCT 47.8 29.5 - 62.1 %   MCV 97.0 80.0 - 100.0 fL   MCH 30.1 26.0 - 34.0 pg   MCHC 31.0 30.0 - 36.0 g/dL   RDW 30.8 (H) 65.7 - 84.6 %   Platelets 237 150 - 400 K/uL   nRBC 0.1 0.0 - 0.2 %    Comment: Performed at [facility], [address], [place], [place] [address]  Differential     Status: Abnormal   Collection Time: 08/11/23  9:47 AM  Result Value Ref Range   Neutrophils Relative % 72 %   Neutro Abs 14.7 (H)  1.7 - 7.7 K/uL   Lymphocytes Relative 16 %   Lymphs Abs 3.1 0.7 - 4.0 K/uL   Monocytes Relative 8 %   Monocytes Absolute 1.5 (H) 0.1 - 1.0 K/uL   Eosinophils Relative 0 %   Eosinophils Absolute 0.0 0.0 - 0.5 K/uL   Basophils Relative 1 %   Basophils Absolute 0.1 0.0 - 0.1 K/uL   Immature Granulocytes 3 %   Abs Immature Granulocytes 0.66 (H) 0.00 - 0.07 K/uL    Comment: Performed at [facility], [address], [place], [place] [address]  Comprehensive metabolic panel     Status: Abnormal   Collection Time: 08/11/23  9:47 AM  Result Value Ref Range   Sodium 144 135 - 145 mmol/L   Potassium 3.4 (L) 3.5 - 5.1 mmol/L   Chloride 105 98 - 111 mmol/L   CO2 26 22 - 32 mmol/L   Glucose, Bld 100 (H) 70 - 99 mg/dL    Comment: Glucose reference range applies only to samples taken after fasting for at least 8 hours.   BUN 22 8 - 23 mg/dL   Creatinine, Ser 2.44 (L) 0.44 - 1.00 mg/dL   Calcium 8.0 (L) 8.9 - 10.3 mg/dL   Total Protein 5.7 (L) 6.5 - 8.1 g/dL   Albumin 2.4 (L) 3.5 - 5.0 g/dL   AST 20 15 - 41 U/L   ALT 17 0 - 44 U/L   Alkaline Phosphatase 53 38 - 126 U/L   Total Bilirubin 1.3 (H) 0.3 - 1.2 mg/dL   GFR, Estimated >01 >02 mL/min    Comment: (NOTE) Calculated using the CKD-EPI Creatinine Equation (2021)    Anion gap 13 5 - 15    Comment: Performed at [facility], [address] [place], [place] [address]  I-stat chem 8, ED     Status: Abnormal   Collection Time: 08/11/23  9:50 AM  Result Value Ref Range   Sodium 146 (H) 135 - 145 mmol/L   Potassium 3.1 (L) 3.5 - 5.1 mmol/L   Chloride 106 98 - 111 mmol/L   BUN 23 8 - 23 mg/dL   Creatinine, Ser 6.44 0.44 - 1.00 mg/dL   Glucose, Bld 034 (H) 70 - 99 mg/dL    Comment: Glucose reference range applies only to samples taken after fasting for at least 8 hours.   Calcium, Ion 1.04 (L) 1.15 - 1.40 mmol/L   TCO2 27 22 -  32 mmol/L   Hemoglobin 12.2 12.0 - 15.0 g/dL   HCT 16.1 09.6 - 04.5 %  Urine rapid drug screen (hosp  performed)     Status: None   Collection Time: 08/11/23 11:46 AM  Result Value Ref Range   Opiates NONE DETECTED NONE DETECTED   Cocaine NONE DETECTED NONE DETECTED   Benzodiazepines NONE DETECTED NONE DETECTED   Amphetamines NONE DETECTED NONE DETECTED   Tetrahydrocannabinol NONE DETECTED NONE DETECTED   Barbiturates NONE DETECTED NONE DETECTED    Comment: (NOTE) DRUG SCREEN FOR MEDICAL PURPOSES ONLY.  IF CONFIRMATION IS NEEDED FOR ANY PURPOSE, NOTIFY LAB WITHIN 5 DAYS.  LOWEST DETECTABLE LIMITS FOR URINE DRUG SCREEN Drug Class                     Cutoff (ng/mL) Amphetamine and metabolites    1000 Barbiturate and metabolites    200 Benzodiazepine                 200 Opiates and metabolites        300 Cocaine and metabolites        300 THC                            50 Performed at [facility], [address] [place], [place] [address]   Urinalysis, Routine w reflex microscopic -Urine, Clean Catch     Status: Abnormal   Collection Time: 08/11/23 11:46 AM  Result Value Ref Range   Color, Urine AMBER (A) YELLOW    Comment: BIOCHEMICALS MAY BE AFFECTED BY COLOR   APPearance HAZY (A) CLEAR   Specific Gravity, Urine 1.035 (H) 1.005 - 1.030   pH 5.0 5.0 - 8.0   Glucose, UA NEGATIVE NEGATIVE mg/dL   Hgb urine dipstick NEGATIVE NEGATIVE   Bilirubin Urine SMALL (A) NEGATIVE   Ketones, ur 5 (A) NEGATIVE mg/dL   Protein, ur 30 (A) NEGATIVE mg/dL   Nitrite NEGATIVE NEGATIVE   Leukocytes,Ua NEGATIVE NEGATIVE   RBC / HPF 0-5 0 - 5 RBC/hpf   WBC, UA 6-10 0 - 5 WBC/hpf   Bacteria, UA NONE SEEN NONE SEEN   Squamous Epithelial / HPF 0-5 0 - 5 /HPF   Mucus PRESENT     Comment: Performed at [facility], [address], [place], [place] [address]  SARS Coronavirus 2 by RT PCR (hospital order, performed in [facility] hospital lab) *cepheid single result test* Anterior Nasal Swab     Status: None   Collection Time: 08/11/23  1:10 PM   Specimen: Anterior Nasal Swab  Result  Value Ref Range   SARS Coronavirus 2 by RT PCR NEGATIVE NEGATIVE    Comment: Performed at [facility], [address] [place], [place] [address]  I-Stat Lactic Acid     Status: None   Collection Time: 08/11/23  5:29 PM  Result Value Ref Range   Lactic Acid, Venous 1.8 0.5 - 1.9 mmol/L   CT CHEST WO CONTRAST  Result Date: 08/11/2023 CLINICAL DATA:  Respiratory illness EXAM: CT CHEST WITHOUT CONTRAST TECHNIQUE: Multidetector CT imaging of the chest was performed following the standard protocol without IV contrast. RADIATION DOSE REDUCTION: This exam was performed according to the departmental dose-optimization program which includes automated exposure control, adjustment of the mA and/or kV according to patient size and/or use of iterative reconstruction technique. COMPARISON:  None Available. FINDINGS: Cardiovascular: No significant vascular  findings. Normal heart size. No pericardial effusion. There are atherosclerotic calcifications of the aorta and coronary arteries. Mediastinum/Nodes: Thyroid gland is heterogeneous and enlarged, particularly the left thyroid. Multiple calcifications are present. This causes shift of the trachea to the right proximally 1 cm. Trachea is widely patent. There are no enlarged mediastinal or hilar lymph nodes. Esophagus is unremarkable. Lungs/Pleura: There is atelectasis in the bilateral lower lobes. There is a small amount of patchy airspace disease in the inferior left upper lobe. There is no pneumothorax or pleural effusion. Upper Abdomen: Bilateral renal calculi are present. Musculoskeletal: The bones are osteopenic. T7 compression deformity is age indeterminate (moderate). IMPRESSION: 1. Small amount of patchy airspace disease in the inferior left upper lobe may be infectious/inflammatory. 2. Bilateral lower lobe atelectasis. 3. Incidental right thyroid nodule with heterogeneous and enlarged thyroid measuring 1 cm. Recommend non-emergent thyroid ultrasound if  clinically warranted given patient age. Reference: J Am Coll Radiol. 2015 Feb;12(2): 143-50 4. Aortic Atherosclerosis (ICD10-I70.0). Electronically Signed   By: Darliss Cheney M.D.   On: 08/11/2023 17:26   DG Chest 1 View  Result Date: 08/11/2023 CLINICAL DATA:  Altered mental status EXAM: CHEST  1 VIEW COMPARISON:  Chest radiograph dated 01/31/2023 FINDINGS: Low lung volumes. Left basilar patchy opacities. Unchanged blunting of the left costophrenic angle. No pneumothorax. Similar cardiomediastinal silhouette status post valve replacement. No acute osseous abnormality. IMPRESSION: 1. Left basilar patchy opacities, which may represent atelectasis, aspiration, or pneumonia. 2. Unchanged blunting of the left costophrenic angle, which may represent a small pleural effusion. Electronically Signed   By: Agustin Cree M.D.   On: 08/11/2023 15:14   MR BRAIN WO CONTRAST  Result Date: 08/11/2023 CLINICAL DATA:  Sudden onset of left-sided facial droop and right-sided weakness. EXAM: MRI HEAD WITHOUT CONTRAST TECHNIQUE: Multiplanar, multiecho pulse sequences of the brain and surrounding structures were obtained without intravenous contrast. COMPARISON:  Head CT from earlier today FINDINGS: Brain: No acute infarction, hemorrhage, hydrocephalus, extra-axial collection or mass effect. Generalized atrophy, cortical thinning is advanced. Chronic left superior frontal and parietal cortically based infarcts. Small chronic bilateral cerebellar infarcts. Ischemic gliosis bilaterally in the deep cerebral white matter. There is a lobulated sellar and suprasellar mass indistinguishable from the pituitary gland and involving the left cavernous sinus, up to 2.3 cm on sagittal T1 weighted imaging, most consistent with pituitary adenoma and known from multiple prior studies. The mass appears contracted when compared to 2021. Vascular: Major flow voids are preserved Skull and upper cervical spine: No focal marrow lesion. Sinuses/Orbits: No  acute finding Other: Intermittently moderate motion artifact. IMPRESSION: 1. Motion degraded brain MRI without acute finding including infarct. 2. Chronic small vessel disease and remote left frontal and parietal cortex infarcts. Generalized brain atrophy. 3. Known pituitary adenoma with decreased size since 2021. Electronically Signed   By: Tiburcio Pea M.D.   On: 08/11/2023 13:31   CT HEAD CODE STROKE WO CONTRAST  Result Date: 08/11/2023 CLINICAL DATA:  Code stroke. Neuro deficit, acute, stroke suspected. Left-sided facial droop. Left-sided weakness. EXAM: CT HEAD WITHOUT CONTRAST TECHNIQUE: Contiguous axial images were obtained from the base of the skull through the vertex without intravenous contrast. RADIATION DOSE REDUCTION: This exam was performed according to the departmental dose-optimization program which includes automated exposure control, adjustment of the mA and/or kV according to patient size and/or use of iterative reconstruction technique. COMPARISON:  CT head without contrast 02/02/2023. MR head without contrast 11/18/2022. FINDINGS: Brain: A remote cortical infarct of the left parietal lobe is  stable. Remote lacunar infarcts of the caudate head bilaterally are stable. No acute infarct, hemorrhage, or mass lesion is present. The ventricles are proportionate to the degree of atrophy. No significant extraaxial fluid collection is present. Remote lacunar infarcts are again noted in the cerebellum bilaterally. The brainstem is unremarkable. A 1.6 cm sellar and suprasellar mass lesion is again noted. Vascular: Atherosclerotic calcifications are present in the cavernous internal carotid arteries. No hyperdense vessel is present. Skull: Calvarium is intact. No focal lytic or blastic lesions are present. No significant extracranial soft tissue lesion is present. Sinuses/Orbits: The paranasal sinuses and mastoid air cells are clear. Bilateral lens replacements are noted. Globes and orbits are  otherwise unremarkable. ASPECTS [facility] Stroke Program Early CT Score) - Ganglionic level infarction (caudate, lentiform nuclei, internal capsule, insula, M1-M3 cortex): 7/7 - Supraganglionic infarction (M4-M6 cortex): 3/3 Total score (0-10 with 10 being normal): 10/10 IMPRESSION: 1. No acute intracranial abnormality or significant interval change. 2. 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 3. Stable remote lacunar infarcts of the caudate head bilaterally and cerebellum bilaterally. 4. Stable 1.6 cm sellar and suprasellar mass lesion. 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 The above was relayed via text pager to Dr. Selina Cooley on 08/11/2023 at 10:07 . Electronically Signed   By: Marin Roberts M.D.   On: 08/11/2023 10:07    Pending Labs Unresulted Labs (From admission, onward)     Start     Ordered   08/11/23 1458  Culture, blood (single)  (Undifferentiated -> Now sepsis confirmed (treatment and sepsis specific nursing orders))  ONCE - STAT,   STAT        08/11/23 1459   08/11/23 1457  Urine Culture  Once,   URGENT       Question:  Indication  Answer:  Sepsis   08/11/23 1459            Vitals/Pain Today's Vitals   08/11/23 1630 08/11/23 1700 08/11/23 1900 08/11/23 1918  BP: (!) 144/85 (!) 173/83 (!) 155/81   Pulse: 92 88 87   Resp: 15 18 (!) 27   Temp:      TempSrc:      SpO2: 98% 97% 100%   Weight:      Height:      PainSc:    0-No pain    Isolation Precautions Enteric precautions (UV disinfection)  Medications Medications  lactated ringers infusion (has no administration in time range)  0.9 %  sodium chloride infusion (has no administration in time range)  cefTRIAXone (ROCEPHIN) 2 g in sodium chloride 0.9 % 100 mL IVPB (2 g Intravenous New Bag/Given 08/11/23 1653)  azithromycin (ZITHROMAX) 500 mg in sodium chloride 0.9 % 250 mL IVPB (has no administration in time range)  iohexol (OMNIPAQUE) 350 MG/ML injection 50 mL (50 mLs Intravenous Contrast  Given 08/11/23 1610)    Mobility non-ambulatory     Focused Assessments     R Recommendations: See Admitting Provider Note  Report given to:   Additional Notes:

## 2023-08-11 NOTE — Sepsis Progress Note (Signed)
Code Sepsis protocol being monitored by eLink. 

## 2023-08-11 NOTE — Code Documentation (Signed)
Stroke Response Nurse Documentation Code Documentation  Christine Newton is a 89 y.o. female arriving to [facility]  via [facility] on 08/11/2023 with past medical hx of HTN, TIA, diabetes, severe dementia, seizure activity and syncopal episodes. On No antithrombotic. Code stroke was activated by EMS.   Patient from home where she was LKW at 0700 and now complaining of Left sided facial droop and right-sided weakness . Per daughter, herself and home health aide cleaned her up at 0700 and patient was in her normal state and went back to sleep. At 0830, home health aide noticed patient woke back up with left-sided facial droop and right-sided weakness.  Stroke team at the bedside on patient arrival. Labs drawn and patient cleared for CT by EDP. Patient to CT with team. NIHSS 15, see documentation for details and code stroke times. Patient with decreased LOC, not following commands, bilateral arm weakness, bilateral leg weakness, and Expressive aphasia  on exam. The following imaging was completed:  CT Head. Patient is not a candidate for IV Thrombolytic due to unclear LKW and declined by patients family. Patient is not a candidate for IR due to poor baseline functional status per MD.   Care Plan: VS/NIHSS q2hr; BP Goal <220/120.   Bedside handoff with ED RN Josh.    Felecia Jan  Stroke Response RN

## 2023-08-11 NOTE — ED Notes (Signed)
EMS 18 G IV on left blew during attempt at CTA/CTP.  Failed attempt at 18G made on right in vicinity of blood draw;  Pt does not have an IV at this time. Pt has no other obvious access.

## 2023-08-11 NOTE — ED Provider Notes (Addendum)
Christine Newton EMERGENCY DEPARTMENT AT [facility] Provider Note   CSN: [id number] Arrival date & time: 08/11/23  0940     History  No chief complaint on file.   Christine Newton is a 89 y.o. female.  Christine Christine Newton is an 89 yo F with PMH dementia, diastolic heart failure, seizures who presented to [facility] ED on 8/16 with concern of stroke. She was observed normal by her daughter/caretaker at 5 and 7 am this morning. Later in the morning, her other care staff noticed that she had reduced responsiveness, difficulty swallowing, left facial droop, and R sided weakness. In ED, Christine Newton has reduced responsiveness and increased lethargy and she is accompanied by her daughter who confirms that her mother is more confused and lethargic than usual. At her baseline, Christine Newton is conversive and oriented to self and time but sometimes disoriented to location due to her dementia. She cannot ambulate due to hip replacement last year.  She has had no reported falls. History is primarily provided by patient's daughter.        Home Medications Prior to Admission medications   Medication Sig Start Date End Date Taking? Authorizing Provider  aspirin EC 81 MG tablet Take 81 mg by mouth daily. Swallow whole.   Yes [provider]  atorvastatin (LIPITOR) 40 MG tablet TAKE 1 TABLET BY MOUTH EVERY DAY 07/17/23  Yes [name], [name], MD  divalproex (DEPAKOTE) 250 MG DR tablet TAKE 1 TABLET BY MOUTH TWICE A DAY Patient taking differently: Take 250 mg by mouth daily. 04/23/23  Yes [name], [name], MD  erythromycin ophthalmic ointment Place 1 Application into the right eye daily as needed (eye drainage).   Yes [provider]  Guaifenesin (MUCINEX MAXIMUM STRENGTH) 1200 MG TB12 Take 1,200 mg by mouth daily as needed (chest congestion).   Yes [provider]  lisinopril (ZESTRIL) 10 MG tablet TAKE 1 TABLET BY MOUTH EVERY DAY 05/02/23  Yes [name], [name], MD  metoprolol tartrate (LOPRESSOR) 25  MG tablet TAKE 1/2 TABLET TWICE A DAY BY MOUTH 07/21/23  Yes [name], [name], MD      Allergies    Cephalosporins    Review of Systems   Review of Systems  Constitutional:  Negative for chills and fever.  Respiratory:  Negative for cough, shortness of breath and wheezing.   Cardiovascular:  Negative for chest pain and palpitations.  Gastrointestinal:  Negative for abdominal pain, diarrhea, nausea and vomiting.  Skin:  Negative for rash.  Neurological:  Positive for weakness. Negative for seizures and syncope.  Psychiatric/Behavioral:  Positive for confusion and decreased concentration.     Physical Exam Updated Vital Signs BP 112/64 (BP Location: Right Arm)   Pulse 70   Temp 97.7 F (36.5 C) (Axillary)   Resp 13   Ht 5\' 6"  (1.676 m)   Wt 60.1 kg   SpO2 100%   BMI 21.39 kg/m  Physical Exam Constitutional:      General: She is not in acute distress.    Appearance: Normal appearance.  HENT:     Head: Normocephalic and atraumatic.  Eyes:     Extraocular Movements: Extraocular movements intact.     Pupils: Pupils are equal, round, and reactive to light.     Comments: Mild periorbital swelling at R eye without bruise  Cardiovascular:     Rate and Rhythm: Normal rate and regular rhythm.     Heart sounds: Murmur heard.     Comments: Systolic murmur Pulmonary:  Effort: Pulmonary effort is normal. No respiratory distress.     Breath sounds: Normal breath sounds.  Abdominal:     General: Abdomen is flat. Bowel sounds are normal.     Palpations: Abdomen is soft.     Tenderness: There is no abdominal tenderness.  Musculoskeletal:     Right lower leg: No edema.     Left lower leg: No edema.     Comments: Stage 1 decubitus ulcer does not appear infected. Well healing midline surgical scar on chest.  Neurological:     Mental Status: She is alert. She is disoriented.     Cranial Nerves: No cranial nerve deficit.     Sensory: No sensory deficit.     Motor: No weakness.      Comments: Oriented to place but not time and self  Psychiatric:        Mood and Affect: Mood normal.        Behavior: Behavior normal.     ED Results / Procedures / Treatments   Labs (all labs ordered are listed, but only abnormal results are displayed) Labs Reviewed  URINE CULTURE - Abnormal; Notable for the following components:      Result Value   Culture   (*)    Value: 40,000 COLONIES/mL AEROCOCCUS SPECIES Standardized susceptibility testing for this organism is not available. Performed at [facility], [address], [place], [place] [address]    All other components within normal limits  BLOOD CULTURE ID PANEL (REFLEXED) - BCID2 - Abnormal; Notable for the following components:   Staphylococcus species DETECTED (*)    All other components within normal limits  CBC - Abnormal; Notable for the following components:   WBC 20.2 (*)    RDW 17.6 (*)    All other components within normal limits  DIFFERENTIAL - Abnormal; Notable for the following components:   Neutro Abs 14.7 (*)    Monocytes Absolute 1.5 (*)    Abs Immature Granulocytes 0.66 (*)    All other components within normal limits  COMPREHENSIVE METABOLIC PANEL - Abnormal; Notable for the following components:   Potassium 3.4 (*)    Glucose, Bld 100 (*)    Creatinine, Ser 0.41 (*)    Calcium 8.0 (*)    Total Protein 5.7 (*)    Albumin 2.4 (*)    Total Bilirubin 1.3 (*)    All other components within normal limits  URINALYSIS, ROUTINE W REFLEX MICROSCOPIC - Abnormal; Notable for the following components:   Color, Urine AMBER (*)    APPearance HAZY (*)    Specific Gravity, Urine 1.035 (*)    Bilirubin Urine SMALL (*)    Ketones, ur 5 (*)    Protein, ur 30 (*)    All other components within normal limits  CBC - Abnormal; Notable for the following components:   WBC 22.6 (*)    Hemoglobin 11.8 (*)    RDW 17.5 (*)    All other components within normal limits  CREATININE, SERUM - Abnormal; Notable for the  following components:   Creatinine, Ser 0.42 (*)    All other components within normal limits  HEPATIC FUNCTION PANEL - Abnormal; Notable for the following components:   Total Protein 5.3 (*)    Albumin 2.2 (*)    Bilirubin, Direct 0.3 (*)    Indirect Bilirubin 0.1 (*)    All other components within normal limits  BASIC METABOLIC PANEL - Abnormal; Notable for the following components:  Potassium 2.9 (*)    Calcium 8.0 (*)    All other components within normal limits  CBC - Abnormal; Notable for the following components:   WBC 21.6 (*)    Hemoglobin 11.5 (*)    RDW 17.6 (*)    All other components within normal limits  BRAIN NATRIURETIC PEPTIDE - Abnormal; Notable for the following components:   B Natriuretic Peptide 1,141.9 (*)    All other components within normal limits  C-REACTIVE PROTEIN - Abnormal; Notable for the following components:   CRP 11.4 (*)    All other components within normal limits  GLUCOSE, CAPILLARY - Abnormal; Notable for the following components:   Glucose-Capillary 130 (*)    All other components within normal limits  C-REACTIVE PROTEIN - Abnormal; Notable for the following components:   CRP 6.5 (*)    All other components within normal limits  CBC WITH DIFFERENTIAL/PLATELET - Abnormal; Notable for the following components:   WBC 18.4 (*)    RBC 3.41 (*)    Hemoglobin 10.3 (*)    HCT 32.3 (*)    RDW 17.2 (*)    nRBC 0.5 (*)    Neutro Abs 16.0 (*)    Abs Immature Granulocytes 0.42 (*)    All other components within normal limits  BRAIN NATRIURETIC PEPTIDE - Abnormal; Notable for the following components:   B Natriuretic Peptide 399.1 (*)    All other components within normal limits  BASIC METABOLIC PANEL - Abnormal; Notable for the following components:   CO2 21 (*)    Glucose, Bld 120 (*)    Calcium 7.9 (*)    All other components within normal limits  GLUCOSE, CAPILLARY - Abnormal; Notable for the following components:   Glucose-Capillary 105  (*)    All other components within normal limits  GLUCOSE, CAPILLARY - Abnormal; Notable for the following components:   Glucose-Capillary 127 (*)    All other components within normal limits  GLUCOSE, CAPILLARY - Abnormal; Notable for the following components:   Glucose-Capillary 107 (*)    All other components within normal limits  GLUCOSE, CAPILLARY - Abnormal; Notable for the following components:   Glucose-Capillary 109 (*)    All other components within normal limits  GLUCOSE, CAPILLARY - Abnormal; Notable for the following components:   Glucose-Capillary 118 (*)    All other components within normal limits  CBC WITH DIFFERENTIAL/PLATELET - Abnormal; Notable for the following components:   WBC 11.5 (*)    RBC 2.87 (*)    Hemoglobin 8.7 (*)    HCT 28.0 (*)    RDW 17.3 (*)    nRBC 1.1 (*)    Neutro Abs 9.6 (*)    Abs Immature Granulocytes 0.25 (*)    All other components within normal limits  GLUCOSE, CAPILLARY - Abnormal; Notable for the following components:   Glucose-Capillary 103 (*)    All other components within normal limits  CBC - Abnormal; Notable for the following components:   WBC 11.2 (*)    RBC 3.01 (*)    Hemoglobin 8.9 (*)    HCT 29.1 (*)    RDW 17.2 (*)    nRBC 0.7 (*)    All other components within normal limits  BRAIN NATRIURETIC PEPTIDE - Abnormal; Notable for the following components:   B Natriuretic Peptide 379.3 (*)    All other components within normal limits  BASIC METABOLIC PANEL - Abnormal; Notable for the following components:   Glucose, Bld 219 (*)  Calcium 7.4 (*)    All other components within normal limits  C-REACTIVE PROTEIN - Abnormal; Notable for the following components:   CRP 1.8 (*)    All other components within normal limits  URINALYSIS, W/ REFLEX TO CULTURE (INFECTION SUSPECTED) - Abnormal; Notable for the following components:   APPearance HAZY (*)    Specific Gravity, Urine 1.039 (*)    Glucose, UA 150 (*)    Ketones, ur  5 (*)    Protein, ur 30 (*)    Bacteria, UA RARE (*)    All other components within normal limits  BLOOD GAS, ARTERIAL - Abnormal; Notable for the following components:   pH, Arterial 7.46 (*)    Acid-Base Excess 3.5 (*)    All other components within normal limits  AMMONIA - Abnormal; Notable for the following components:   Ammonia 48 (*)    All other components within normal limits  GLUCOSE, CAPILLARY - Abnormal; Notable for the following components:   Glucose-Capillary 123 (*)    All other components within normal limits  I-STAT CHEM 8, ED - Abnormal; Notable for the following components:   Sodium 146 (*)    Potassium 3.1 (*)    Glucose, Bld 102 (*)    Calcium, Ion 1.04 (*)    All other components within normal limits  TROPONIN I (HIGH SENSITIVITY) - Abnormal; Notable for the following components:   Troponin I (High Sensitivity) 29 (*)    All other components within normal limits  TROPONIN I (HIGH SENSITIVITY) - Abnormal; Notable for the following components:   Troponin I (High Sensitivity) 31 (*)    All other components within normal limits  TROPONIN I (HIGH SENSITIVITY) - Abnormal; Notable for the following components:   Troponin I (High Sensitivity) 25 (*)    All other components within normal limits  TROPONIN I (HIGH SENSITIVITY) - Abnormal; Notable for the following components:   Troponin I (High Sensitivity) 29 (*)    All other components within normal limits  SARS CORONAVIRUS 2 BY RT PCR  CULTURE, BLOOD (SINGLE)  MRSA NEXT GEN BY PCR, NASAL  EXPECTORATED SPUTUM ASSESSMENT W GRAM STAIN, RFLX TO RESP C  ETHANOL  PROTIME-INR  APTT  RAPID URINE DRUG SCREEN, HOSP PERFORMED  MAGNESIUM  HEMOGLOBIN A1C  PROCALCITONIN  MAGNESIUM  PHOSPHORUS  MAGNESIUM  GLUCOSE, CAPILLARY  GLUCOSE, CAPILLARY  GLUCOSE, CAPILLARY  GLUCOSE, CAPILLARY  MAGNESIUM  GLUCOSE, CAPILLARY  GLUCOSE, CAPILLARY  GLUCOSE, CAPILLARY  MAGNESIUM  PROCALCITONIN  GLUCOSE, CAPILLARY  CBG  MONITORING, ED  I-STAT CG4 LACTIC ACID, ED  I-STAT CG4 LACTIC ACID, ED  CBG MONITORING, ED    EKG EKG Interpretation Date/Time:  Friday August 11 2023 10:23:58 EDT Ventricular Rate:  77 PR Interval:  149 QRS Duration:  148 QT Interval:  450 QTC Calculation: 510 R Axis:   -21  Text Interpretation: Sinus rhythm Left atrial enlargement Left bundle branch block Confirmed by Pricilla Loveless [phone]) on 08/12/2023 12:29:12 PM  Radiology EEG adult  Result Date: 08/14/2023 Charlsie Quest, MD     08/14/2023  2:27 PM Patient Name: Christine Newton MRN: [id number] Epilepsy Attending: Charlsie Quest Referring Physician/Provider: Leroy Sea, MD Date: 08/14/2023 Duration: 23.17 mins Patient history: 89yo F with ams getting eeg to evaluate for seizure Level of alertness: Awake AEDs during EEG study: VPA Technical aspects: This EEG study was done with scalp electrodes positioned according to the 10-20 International system of electrode placement. Electrical activity was reviewed with band pass  filter of 1-70Hz , sensitivity of 7 uV/mm, display speed of 69mm/sec with a 60Hz  notched filter applied as appropriate. EEG data were recorded continuously and digitally stored.  Video monitoring was available and reviewed as appropriate. Description: The posterior dominant rhythm consists of 7 Hz activity of moderate voltage (25-35 uV) seen predominantly in posterior head regions, symmetric and reactive to eye opening and eye closing. EEG showed continuous generalized 5 to 7 Hz theta slowing. Hyperventilation and photic stimulation were not performed.   ABNORMALITY - Continuous slow, generalized IMPRESSION: This study is suggestive of moderate diffuse encephalopathy, nonspecific etiology. No seizures or epileptiform discharges were seen throughout the recording. Priyanka Annabelle Harman   CT HEAD WO CONTRAST ( )  Result Date: 08/14/2023 CLINICAL DATA:  Mental status change, unknown cause EXAM: CT HEAD WITHOUT  CONTRAST TECHNIQUE: Contiguous axial images were obtained from the base of the skull through the vertex without intravenous contrast. RADIATION DOSE REDUCTION: This exam was performed according to the departmental dose-optimization program which includes automated exposure control, adjustment of the mA and/or kV according to patient size and/or use of iterative reconstruction technique. COMPARISON:  CT head 08/11/2023. FINDINGS: Brain: Similar remote infarcts in the caudate and left parietal lobe. No evidence of acute large vascular territory infarct, acute hemorrhage, or midline shift. Patchy white matter hypodensities are nonspecific but compatible with chronic microvascular ischemic disease. Grossly similar sellar/suprasellar mass. Vascular: Calcific atherosclerosis. Skull: No acute fracture. Sinuses/Orbits: Left sphenoid sinus mucosal thickening. No acute orbital findings. Other: No mastoid effusions. IMPRESSION: 1. No evidence of acute intracranial abnormality. 2. Similar remote infarcts and chronic microvascular ischemic change. 3. Grossly similar sellar/suprasellar mass. Electronically Signed   By: Feliberto Harts M.D.   On: 08/14/2023 11:55   DG Chest Port 1 View  Result Date: 08/14/2023 CLINICAL DATA:  Shortness of breath. EXAM: PORTABLE CHEST 1 VIEW COMPARISON:  August 12, 2023. FINDINGS: Stable cardiomediastinal silhouette. Status post aortic valve repair. Moderate left pleural effusion is noted with associated left basilar atelectasis. Right lung is unremarkable. Bony thorax is unremarkable. IMPRESSION: Moderate left pleural effusion is noted with associated left basilar atelectasis. Electronically Signed   By: Lupita Raider M.D.   On: 08/14/2023 08:21   VAS [name] UPPER EXTREMITY VENOUS DUPLEX  Result Date: 08/13/2023 UPPER VENOUS STUDY  Patient Name:  Staysha SHIVER Duley  Date of Exam:   08/12/2023 Medical Rec #: [id number]            Accession #:    [id number] Date of Birth: 1933-01-15             Patient Gender: F Patient Age:   56 years Exam Location:  [facility] Procedure:      VAS [name] UPPER EXTREMITY VENOUS DUPLEX Referring Phys: Jacqlyn Krauss OGBATA --------------------------------------------------------------------------------  Indications: Edema Limitations: Body habitus, poor ultrasound/tissue interface, bandages, line and Subcutaneous edema, patient's altered mental status (patient asking me to stop exam multiple times). Comparison Study: No prior study Performing Technologist: Sherren Kerns RVS  Examination Guidelines: A complete evaluation includes B-mode imaging, spectral Doppler, color Doppler, and power Doppler as needed of all accessible portions of each vessel. Bilateral testing is considered an integral part of a complete examination. Limited examinations for reoccurring indications may be performed as noted.  Right Findings: +----------+------------+---------+-----------+----------+---------------------+ RIGHT     CompressiblePhasicitySpontaneousProperties       Summary        +----------+------------+---------+-----------+----------+---------------------+ IJV           Full  Yes       Yes                                    +----------+------------+---------+-----------+----------+---------------------+ Subclavian               Yes       Yes                                    +----------+------------+---------+-----------+----------+---------------------+ Axillary                 Yes       Yes                                    +----------+------------+---------+-----------+----------+---------------------+ Brachial      Full       Yes       Yes                                    +----------+------------+---------+-----------+----------+---------------------+ Cephalic      Full                                                        +----------+------------+---------+-----------+----------+---------------------+ Basilic                   Yes       Yes               patent by color and                                                             Doppler        +----------+------------+---------+-----------+----------+---------------------+  Left Findings: +----------+------------+---------+-----------+----------+--------------+ LEFT      CompressiblePhasicitySpontaneousProperties   Summary     +----------+------------+---------+-----------+----------+--------------+ IJV           Full       Yes       Yes                             +----------+------------+---------+-----------+----------+--------------+ Subclavian    Full       Yes       Yes                             +----------+------------+---------+-----------+----------+--------------+ Brachial      Full       Yes       Yes                             +----------+------------+---------+-----------+----------+--------------+ Radial  Not visualized +----------+------------+---------+-----------+----------+--------------+ Ulnar                                               Not visualized +----------+------------+---------+-----------+----------+--------------+ Cephalic                                            Not visualized +----------+------------+---------+-----------+----------+--------------+ Basilic                                             Not visualized +----------+------------+---------+-----------+----------+--------------+  Summary:  Right: No evidence of deep vein thrombosis in the upper extremity. No evidence of superficial vein thrombosis in the upper extremity.  Left: No DVT or SVT noted in the visualized veins of the left upper extremity. Significant subcutaneous edema noted.  *See table(s) above for measurements and observations.  Diagnosing physician: Lemar Livings MD Electronically signed by Lemar Livings MD on 08/13/2023 at 1:49:26 PM.    Final    VAS [name] LOWER  EXTREMITY VENOUS (DVT)  Result Date: 08/13/2023  Lower Venous DVT Study Patient Name:  Christine Newton  Date of Exam:   08/12/2023 Medical Rec #: [id number]            Accession #:    [id number] Date of Birth: 07/29/1933            Patient Gender: F Patient Age:   24 years Exam Location:  [facility] Procedure:      VAS [name] LOWER EXTREMITY VENOUS (DVT) Referring Phys: Jacqlyn Krauss OGBATA --------------------------------------------------------------------------------  Indications: Edema.  Risk Factors: Dementia, chronic CHF, valvular hear disease, status post aortic valve replacement. Limitations: Patient condition/confusion, body habitus and poor ultrasound/tissue interface secondary to subcutaneous edema and arterial plaque shadowing, chronic contractures of the left lower extremity . Comparison Study: No prior study Performing Technologist: Sherren Kerns RVS  Examination Guidelines: A complete evaluation includes B-mode imaging, spectral Doppler, color Doppler, and power Doppler as needed of all accessible portions of each vessel. Bilateral testing is considered an integral part of a complete examination. Limited examinations for reoccurring indications may be performed as noted. The reflux portion of the exam is performed with the patient in reverse Trendelenburg.  +------+---------------+---------+-----------+----------+--------------+ RIGHT CompressibilityPhasicitySpontaneityPropertiesThrombus Aging +------+---------------+---------+-----------+----------+--------------+ FV Mid               Yes      Yes                                 +------+---------------+---------+-----------+----------+--------------+ Extremely limited and technically difficult study secondary to body habitus, edema, and acoustic shadowing from arterial plaque. Non diagnostic study   Extremely limited and technically difficult study secondary to body habitus, edema, and acoustic shadowing from arterial plaque. Non  diagnostic study    Summary: RIGHT: - Extremely limited and technically difficult study secondary to body habitus, edema, and acoustic shadowing from arterial plaque. Non diagnostic study  LEFT: - Extremely limited and technically difficult study secondary to body habitus, edema, and acoustic shadowing from arterial plaque. Non diagnostic study  *See table(s) above for measurements and observations. Electronically signed by Lemar Livings  MD on 08/13/2023 at 1:49:18 PM.    Final     Procedures Procedures    Medications Ordered in ED Medications  azithromycin (ZITHROMAX) 500 mg in sodium chloride 0.9 % 250 mL IVPB (500 mg Intravenous New Bag/Given 08/14/23 1508)  aspirin EC tablet 81 mg (81 mg Oral Given 08/14/23 0813)  atorvastatin (LIPITOR) tablet 40 mg (40 mg Oral Given 08/14/23 0813)  metoprolol tartrate (LOPRESSOR) tablet 12.5 mg (12.5 mg Oral Given 08/14/23 0813)  divalproex (DEPAKOTE) DR tablet 250 mg (250 mg Oral Given 08/14/23 0814)  enoxaparin (LOVENOX) injection 40 mg (40 mg Subcutaneous Given 08/14/23 0814)  insulin aspart (novoLOG) injection 0-9 Units ( Subcutaneous Not Given 08/14/23 1209)  lactated ringers 1,000 mL with potassium chloride 40 mEq infusion ( Intravenous Stopped 08/13/23 0230)  amLODipine (NORVASC) tablet 10 mg (10 mg Oral Given 08/14/23 0813)  hydrALAZINE (APRESOLINE) injection 10 mg (has no administration in time range)  Ampicillin-Sulbactam (UNASYN) 3 g in sodium chloride 0.9 % 100 mL IVPB (3 g Intravenous New Bag/Given 08/14/23 0800)  levalbuterol (XOPENEX) nebulizer solution 0.63 mg (has no administration in time range)  pantoprazole (PROTONIX) EC tablet 40 mg (40 mg Oral Given 08/14/23 0813)  dextrose 5 % solution ( Intravenous New Bag/Given 08/14/23 1116)  lactulose (CHRONULAC) 10 GM/15ML solution 20 g (20 g Oral Given 08/14/23 1508)  0.9 %  sodium chloride infusion ( Intravenous New Bag/Given 08/11/23 2100)  iohexol (OMNIPAQUE) 350 MG/ML injection 50 mL (50 mLs Intravenous  Contrast Given 08/11/23 1610)  potassium chloride 10 mEq in 100 mL IVPB (10 mEq Intravenous New Bag/Given 08/12/23 0259)  furosemide (LASIX) injection 40 mg (40 mg Intravenous Given 08/13/23 1236)  potassium chloride SA (KLOR-CON M) CR tablet 40 mEq (40 mEq Oral Given 08/13/23 1236)  dextrose 50 % solution 50 mL (50 mLs Intravenous Given 08/14/23 0755)    ED Course/ Medical Decision Making/ A&P Clinical Course as of 08/14/23 1533  Fri Aug 11, 2023  1453 CTH stable. MRI stable. Labs concerning for leukocytosis of 20.2 with neutrophillia of 14.7. pending source.  [AG]    Clinical Course User Index [AG] Franne Forts, DO                                 Medical Decision Making Christine Manjot Peiffer presented with AMS change from baseline and concern for stroke. She has underlying dementia with occasional disorientation to location but today was found to be non-conversive with new weakness, particularly on the R side. Stroke workup was negative per CT and MRI. She does however have a white count of 20. Per family the patient's only symptoms of illness over this week were increased nasal congestion and increased dry cough. UA was negative for acute infection. CXR with l patchy infiltrates possible pneumonia. CT chest ordered and pending. During ED course code sepsis was activated given leukocytosis and tachypnea. IV fluids were started but no 30cc/kg bolus due to no septic shock. LR at 100 ml/hr ordered given patient's elderly age and stable blood pressure. Source likely pneumonia. Rocephin allergy. Levaquin started. She is pending admission to hospitalist service at time of shift change.    Amount and/or Complexity of Data Reviewed Labs: ordered. Radiology: ordered.  Risk Prescription drug management. Decision regarding hospitalization.          Final Clinical Impression(s) / ED Diagnoses Final diagnoses:  Sepsis, due to unspecified organism, unspecified whether acute organ dysfunction  present [facility])  Altered mental status, unspecified altered mental status type  Pneumonia of left lower lobe due to infectious organism    Rx / DC Orders ED Discharge Orders     None         Katheran James, DO 08/11/23 1624    Katheran James, DO 08/14/23 1529    Katheran James, DO 08/14/23 1533    Franne Forts, DO 08/20/23 1402

## 2023-08-11 NOTE — Consult Note (Signed)
Neurology Consultation  Reason for Consult: Left-sided facial droop and right-sided weakness Referring Physician: Dr. Wallace Cullens   CC: None  History is obtained from: EMS and chart  HPI: Christine Newton is a 89 y.o. female with history of hypertension, TIA, diabetes, severe dementia, seizure activity and syncopal episodes who presents with sudden onset left-sided facial droop and right-sided weakness.  Patient's home health aide came to see her this morning and stated that she was in her normal state at 645 when she came in and then went back to sleep.  At 830, patient woke up and had left-sided facial droop and right-sided weakness.  CT head showed no acute process by attending personal review. TNK was considered but declined by family. CTA was not performed as part of the stroke code bc her mRS of 5 would make her ineligible for thrombectomy regardless of findings.   LKW: Unclear, possibly 645 TNK given?: no, declined by family IR Thrombectomy? No, not a candidate for mechanical thrombectomy due to poor baseline functional status Modified Rankin Scale: 5-Severe disability-bedridden, incontinent, needs constant attention  ROS: Unable to obtain due to altered mental status.   Past Medical History:  Diagnosis Date   Closed fracture of left inferior and superior pubic ramus (HCC) 01/19/2022   Diabetes (HCC)    Hypertension    Nonrheumatic aortic (valve) stenosis 09/21/2020     No family history on file.   Social History:   reports that she has never smoked. She has never used smokeless tobacco. She reports that she does not drink alcohol and does not use drugs.  Medications No current facility-administered medications for this encounter.  Current Outpatient Medications:    aspirin EC 81 MG tablet, Take 1 tablet (81 mg total) by mouth daily. Swallow whole. (Patient taking differently: Take 81 mg by mouth daily.), Disp: 30 tablet, Rfl: 11   atorvastatin (LIPITOR) 40 MG tablet, TAKE 1  TABLET BY MOUTH EVERY DAY, Disp: 90 tablet, Rfl: 1   Carboxymethylcellulose Sodium (DRY EYE RELIEF OP), Place 1 drop into both eyes daily as needed (dry eyes). (Patient not taking: Reported on 06/16/2023), Disp: , Rfl:    divalproex (DEPAKOTE) 250 MG DR tablet, TAKE 1 TABLET BY MOUTH TWICE A DAY (Patient taking differently: Take 250 mg by mouth daily.), Disp: 180 tablet, Rfl: 1   lisinopril (ZESTRIL) 10 MG tablet, TAKE 1 TABLET BY MOUTH EVERY DAY, Disp: 90 tablet, Rfl: 0   metoprolol tartrate (LOPRESSOR) 25 MG tablet, TAKE 1/2 TABLET TWICE A DAY BY MOUTH, Disp: 90 tablet, Rfl: 0   Exam: Current vital signs: Ht 5\' 6"  (1.676 m)   Wt 63 kg   BMI 22.42 kg/m  Vital signs in last 24 hours: Weight:  [63 kg] 63 kg (08/16 0900)  GENERAL: Awake, alert, in no acute distress Psych: Affect flat, patient is calm and cooperative with examination Head: Normocephalic and atraumatic, without obvious abnormality EENT: Normal conjunctivae, moist mucous membranes, no OP obstruction LUNGS: Normal respiratory effort. Non-labored breathing on room air Extremities: warm, well perfused, without obvious deformity  NEURO:  Mental Status: Awake, alert, and oriented to person, place, time, and situation. She is not able to provide a clear and coherent history of present illness. Speech/Language: speech is clear but nonfluent.   Speaks in single words and short phrases with clear speech but is unable to name most objects No neglect is noted Cranial Nerves:  II: PERRL blinks to threat bilaterally III, IV, VI: EOMI. Lid elevation symmetric and full.  V: Sensation is intact to light touch and symmetrical to face. Blinks to threat.  VII: Face is symmetric resting and smiling.  VIII: Hearing intact to voice IX, X: Phonation normal.  XII: Tongue protrudes midline without fasciculations.   Motor: Able to move bilateral upper extremities with some antigravity strength, cannot lift bilateral lower extremities off the  stretcher Tone is normal. Bulk is normal.  Sensation: Intact to light touch bilaterally in all four extremities. No extinction to DSS present.  Coordination: Unable to perform Gait: Deferred  NIHSS: 1a Level of Conscious.: 0 1b LOC Questions: 2 1c LOC Commands: 1 2 Best Gaze: 0 3 Visual: 0 4 Facial Palsy: 0 5a Motor Arm - left: 2 5b Motor Arm - Right: 2 6a Motor Leg - Left: 3 6b Motor Leg - Right: 3 7 Limb Ataxia: 0 8 Sensory: 0 9 Best Language: 1 10 Dysarthria: 0 11 Extinct. and Inatten.: 0 TOTAL: 14   Labs I have reviewed labs in epic and the results pertinent to this consultation are:  CBC    Component Value Date/Time   WBC 12.6 (H) 01/31/2023 1727   RBC 4.06 01/31/2023 1727   HGB 12.2 08/11/2023 0950   HGB 11.9 09/19/2022 1526   HCT 36.0 08/11/2023 0950   HCT 36.9 09/19/2022 1526   PLT 272 01/31/2023 1727   PLT 259 09/19/2022 1526   MCV 91.4 01/31/2023 1727   MCV 86 09/19/2022 1526   MCH 28.8 01/31/2023 1727   MCHC 31.5 01/31/2023 1727   RDW 17.1 (H) 01/31/2023 1727   RDW 14.6 09/19/2022 1526   LYMPHSABS 3.0 01/31/2023 1727   LYMPHSABS 2.0 09/19/2022 1526   MONOABS 0.9 01/31/2023 1727   EOSABS 0.1 01/31/2023 1727   EOSABS 0.1 09/19/2022 1526   BASOSABS 0.1 01/31/2023 1727   BASOSABS 0.1 09/19/2022 1526    CMP     Component Value Date/Time   NA 146 (H) 08/11/2023 0950   NA 146 (H) 09/19/2022 1526   K 3.1 (L) 08/11/2023 0950   CL 106 08/11/2023 0950   CO2 26 01/31/2023 1727   GLUCOSE 102 (H) 08/11/2023 0950   BUN 23 08/11/2023 0950   BUN 29 (H) 09/19/2022 1526   CREATININE 0.50 08/11/2023 0950   CALCIUM 8.3 (L) 01/31/2023 1727   PROT 5.2 (L) 01/31/2023 1727   PROT 6.5 09/19/2022 1526   ALBUMIN 2.3 (L) 01/31/2023 1727   ALBUMIN 3.8 09/19/2022 1526   AST 16 01/31/2023 1727   ALT 9 01/31/2023 1727   ALKPHOS 64 01/31/2023 1727   BILITOT 0.2 (L) 01/31/2023 1727   BILITOT 0.3 09/19/2022 1526   GFRNONAA >60 01/31/2023 1727   GFRAA 78 09/17/2020  1438    Lipid Panel     Component Value Date/Time   CHOL 167 07/12/2022 1520   TRIG 99 07/12/2022 1520   HDL 48 07/12/2022 1520   CHOLHDL 3.5 07/12/2022 1520   CHOLHDL 4.1 12/14/2020 0431   VLDL 14 12/14/2020 0431   LDLCALC 101 (H) 07/12/2022 1520     Imaging I have reviewed the images obtained:  CT-scan of the brain: No acute abnormality, stable remote cortical infarct in left parietal lobe and chronic lacunar infarcts in caudate head and cerebellum, stable 1.6 cm sellar and suprasellar mass lesion 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 Motion degraded brain MRI without acute finding including infarct. 2. Chronic small vessel disease and remote left frontal and parietal cortex infarcts. Generalized brain atrophy. 3. Known pituitary adenoma  with decreased size since 2021.  Assessment: 89 year old patient with history of hypertension, TIA, diabetes, seizures, syncopal episodes and severe dementia presents with new onset right-sided weakness and left-sided facial droop.  She has severe dementia and is bedbound at baseline.  TNK was declined by family in light of unclear last known well, and patient was not a candidate for mechanical thrombectomy due to poor baseline functional status.  On exam, she is noted to be disoriented to place time and situation with weakness in bilateral upper and lower extremities.  She is able to name some objects and answers "I do not know" to others.  Suspect that deficits are more due to her baseline dementia than aphasia.  MRI brain was negative for acute stroke, and on chest x-ray, patient was found to have pneumonia.  Patient's deficits are likely toxic metabolic encephalopathy as well as delirium in the setting of infection in patient with baseline dementia.  Impression: Toxic metabolic encephalopathy and delirium in the setting of baseline dementia and pneumonia  Recommendations: -Treatment of pneumonia per primary  team -Delirium precautions -Will sign off, please contact us with any questions or concerns  Pt seen by NP/Neuro and later by MD. Note/plan to be edited by MD as needed.  Cortney E Ernestina Columbia , MSN, AGACNP-BC [facility] See Amion for schedule and pager information 08/11/2023 10:09 AM   Attending Neurohospitalist Addendum Patient seen and examined with APP/Resident. Agree with the history and physical as documented above. Agree with the plan as documented, which I helped formulate. I have edited the note above to reflect my full findings and recommendations. I have independently reviewed the chart, obtained history, review of systems and examined the patient.I have personally reviewed pertinent head/neck/spine imaging (CT/MRI). Please feel free to call with any questions.  -- Bing Neighbors, MD [facility] [phone]  If 7pm- 7am, please page neurology on call as listed in AMION.

## 2023-08-12 ENCOUNTER — Other Ambulatory Visit: Payer: Self-pay

## 2023-08-12 ENCOUNTER — Inpatient Hospital Stay (HOSPITAL_COMMUNITY): Payer: Medicare PPO

## 2023-08-12 DIAGNOSIS — R4182 Altered mental status, unspecified: Secondary | ICD-10-CM | POA: Diagnosis not present

## 2023-08-12 DIAGNOSIS — J189 Pneumonia, unspecified organism: Secondary | ICD-10-CM | POA: Diagnosis not present

## 2023-08-12 DIAGNOSIS — R609 Edema, unspecified: Secondary | ICD-10-CM | POA: Diagnosis not present

## 2023-08-12 LAB — GLUCOSE, CAPILLARY
Glucose-Capillary: 105 mg/dL — ABNORMAL HIGH (ref 70–99)
Glucose-Capillary: 127 mg/dL — ABNORMAL HIGH (ref 70–99)
Glucose-Capillary: 130 mg/dL — ABNORMAL HIGH (ref 70–99)
Glucose-Capillary: 71 mg/dL (ref 70–99)
Glucose-Capillary: 79 mg/dL (ref 70–99)
Glucose-Capillary: 88 mg/dL (ref 70–99)

## 2023-08-12 LAB — CBC
HCT: 36.4 % (ref 36.0–46.0)
HCT: 37.1 % (ref 36.0–46.0)
Hemoglobin: 11.5 g/dL — ABNORMAL LOW (ref 12.0–15.0)
Hemoglobin: 11.8 g/dL — ABNORMAL LOW (ref 12.0–15.0)
MCH: 29.3 pg (ref 26.0–34.0)
MCH: 30.4 pg (ref 26.0–34.0)
MCHC: 31 g/dL (ref 30.0–36.0)
MCHC: 32.4 g/dL (ref 30.0–36.0)
MCV: 93.8 fL (ref 80.0–100.0)
MCV: 94.6 fL (ref 80.0–100.0)
Platelets: 153 10*3/uL (ref 150–400)
Platelets: 231 10*3/uL (ref 150–400)
RBC: 3.88 MIL/uL (ref 3.87–5.11)
RBC: 3.92 MIL/uL (ref 3.87–5.11)
RDW: 17.5 % — ABNORMAL HIGH (ref 11.5–15.5)
RDW: 17.6 % — ABNORMAL HIGH (ref 11.5–15.5)
WBC: 21.6 10*3/uL — ABNORMAL HIGH (ref 4.0–10.5)
WBC: 22.6 10*3/uL — ABNORMAL HIGH (ref 4.0–10.5)
nRBC: 0.1 % (ref 0.0–0.2)
nRBC: 0.1 % (ref 0.0–0.2)

## 2023-08-12 LAB — HEPATIC FUNCTION PANEL
ALT: 18 U/L (ref 0–44)
AST: 17 U/L (ref 15–41)
Albumin: 2.2 g/dL — ABNORMAL LOW (ref 3.5–5.0)
Alkaline Phosphatase: 54 U/L (ref 38–126)
Bilirubin, Direct: 0.3 mg/dL — ABNORMAL HIGH (ref 0.0–0.2)
Indirect Bilirubin: 0.1 mg/dL — ABNORMAL LOW (ref 0.3–0.9)
Total Bilirubin: 0.4 mg/dL (ref 0.3–1.2)
Total Protein: 5.3 g/dL — ABNORMAL LOW (ref 6.5–8.1)

## 2023-08-12 LAB — BASIC METABOLIC PANEL
Anion gap: 10 (ref 5–15)
BUN: 19 mg/dL (ref 8–23)
CO2: 26 mmol/L (ref 22–32)
Calcium: 8 mg/dL — ABNORMAL LOW (ref 8.9–10.3)
Chloride: 107 mmol/L (ref 98–111)
Creatinine, Ser: 0.44 mg/dL (ref 0.44–1.00)
GFR, Estimated: >60 mL/min (ref >60)
Glucose, Bld: 92 mg/dL (ref 70–99)
Potassium: 2.9 mmol/L — ABNORMAL LOW (ref 3.5–5.1)
Sodium: 143 mmol/L (ref 135–145)

## 2023-08-12 LAB — CREATININE, SERUM
Creatinine, Ser: 0.42 mg/dL — ABNORMAL LOW (ref 0.44–1.00)
GFR, Estimated: >60 mL/min (ref >60)

## 2023-08-12 LAB — TROPONIN I (HIGH SENSITIVITY)
Troponin I (High Sensitivity): 25 ng/L — ABNORMAL HIGH (ref <18)
Troponin I (High Sensitivity): 29 ng/L — ABNORMAL HIGH (ref <18)

## 2023-08-12 LAB — MRSA NEXT GEN BY PCR, NASAL: MRSA by PCR Next Gen: NOT DETECTED

## 2023-08-12 LAB — MAGNESIUM
Magnesium: 2 mg/dL (ref 1.7–2.4)
Magnesium: 2.1 mg/dL (ref 1.7–2.4)

## 2023-08-12 LAB — PHOSPHORUS: Phosphorus: 3.7 mg/dL (ref 2.5–4.6)

## 2023-08-12 LAB — BRAIN NATRIURETIC PEPTIDE: B Natriuretic Peptide: 1141.9 pg/mL — ABNORMAL HIGH (ref 0.0–100.0)

## 2023-08-12 LAB — C-REACTIVE PROTEIN: CRP: 11.4 mg/dL — ABNORMAL HIGH (ref <1.0)

## 2023-08-12 MED ORDER — POTASSIUM CHLORIDE 2 MEQ/ML IV SOLN
INTRAVENOUS | Status: AC
  Filled 2023-08-12 (×2): qty 1000

## 2023-08-12 MED ORDER — HYDRALAZINE HCL 20 MG/ML IJ SOLN: 10.0000 mg | Freq: Four times a day (QID) | INTRAMUSCULAR | Status: DC | PRN

## 2023-08-12 MED ORDER — SODIUM CHLORIDE 0.9 % IV SOLN
3.0000 g | Freq: Three times a day (TID) | INTRAVENOUS | Status: AC
  Administered 2023-08-12 – 2023-08-17 (×15): 3 g via INTRAVENOUS
  Filled 2023-08-12 (×15): qty 8

## 2023-08-12 MED ORDER — AMLODIPINE BESYLATE 10 MG PO TABS
10.0000 mg | ORAL_TABLET | Freq: Every day | ORAL | Status: DC
  Administered 2023-08-12 – 2023-08-17 (×6): 10 mg via ORAL
  Filled 2023-08-12 (×7): qty 1

## 2023-08-12 NOTE — Evaluation (Signed)
Clinical/Bedside Swallow Evaluation Patient Details  Name: Christine Newton MRN: [id number] Date of Birth: 1933-05-28  Today's Date: 08/12/2023 Time: SLP Start Time (ACUTE ONLY): 1117 SLP Stop Time (ACUTE ONLY): 1139 SLP Time Calculation (min) (ACUTE ONLY): 22 min  Past Medical History:  Past Medical History:  Diagnosis Date   Closed fracture of left inferior and superior pubic ramus (HCC) 01/19/2022   Diabetes (HCC)    Hypertension    Nonrheumatic aortic (valve) stenosis 09/21/2020   Past Surgical History:  Past Surgical History:  Procedure Laterality Date   CARDIAC VALVE REPLACEMENT     CHOLECYSTECTOMY     INTRAMEDULLARY (IM) NAIL INTERTROCHANTERIC Left 10/02/2022   Procedure: INTRAMEDULLARY (IM) NAIL INTERTROCHANTERIC;  Surgeon: Huel Cote, MD;  Location: [facility] OR;  Service: Orthopedics;  Laterality: Left;   HPI:  Christine Newton is an 89 yo female presenting to ED 8/16 from home with AMS, R sided weakness, and lethargy. MRI Brain unremarkable. CXR with bilateral lower lobe atelectasis. Found to have sepsis. PMH includes dementia, HTN, TIA, DM, CHF, seizures, syncope, s/p aortic valve placement and LVH    Assessment / Plan / Recommendation  Clinical Impression  Pt's daughter reports no prior concern for swallowing and that at baseline, pt typically receives assistance to consume a diet consisting of small, cut up pieces with thin liquids. Pt receives HH therapy, where her daughter reports they have targeted feeding strategies and reinforced education regarding aspiration precautions. Pt drowsy but easily roused and able to remain adequately alert throughout PO trials. Oral motor exam grossly WFL, although note pt with some difficutly following commands to fully assess. Pt observed with trials of thin liquids via straw, purees, and solids with no overt s/s of dysphagia or aspiration. Pt did consistently belch following sips of thin liquids which could be representative of an  esophageal component. Overall, pt's daughter recognizes pt's mentation may fluctuate throughout the day but pt appears to have great support. Education provided regarding aspiration precautions and only feeding pt when fully awake/alert, which pt's daughter demonstrated understanding of by using teach back method. Pt demonstrates functional swallow ability to continue diet of Dys 3 textures with thin liquids, which is what most closely resembles her baseline. No further SLP f/u is necessary at this time.  SLP Visit Diagnosis: Dysphagia, unspecified (R13.10)    Aspiration Risk  Mild aspiration risk    Diet Recommendation Dysphagia 3 (Mech soft);Thin liquid    Liquid Administration via: Cup;Straw Medication Administration: Whole meds with puree Supervision: Staff to assist with self feeding;Full supervision/cueing for compensatory strategies Compensations: Slow rate;Small sips/bites Postural Changes: Seated upright at 90 degrees;Remain upright for at least 30 minutes after po intake    Other  Recommendations Oral Care Recommendations: Oral care BID    Recommendations for follow up therapy are one component of a multi-disciplinary discharge planning process, led by the attending physician.  Recommendations may be updated based on patient status, additional functional criteria and insurance authorization.  Follow up Recommendations No SLP follow up      Assistance Recommended at Discharge    Functional Status Assessment Patient has not had a recent decline in their functional status  Frequency and Duration            Prognosis Prognosis for improved oropharyngeal function: Good Barriers to Reach Goals: Cognitive deficits      Swallow Study   General HPI: Christine Newton is an 89 yo female presenting to ED 8/16 from home with AMS, R sided  weakness, and lethargy. MRI Brain unremarkable. CXR with bilateral lower lobe atelectasis. Found to have sepsis. PMH includes dementia, HTN, TIA, DM,  CHF, seizures, syncope, s/p aortic valve placement and LVH Type of Study: Bedside Swallow Evaluation Previous Swallow Assessment: none in chart Diet Prior to this Study: NPO Temperature Spikes Noted: No Respiratory Status: Room air History of Recent Intubation: No Behavior/Cognition: Alert;Cooperative;Requires cueing Oral Cavity Assessment: Within Functional Limits Oral Care Completed by SLP: No Oral Cavity - Dentition: Adequate natural dentition Vision: Functional for self-feeding Self-Feeding Abilities: Needs assist Patient Positioning: Upright in bed Baseline Vocal Quality: Normal Volitional Cough: Cognitively unable to elicit Volitional Swallow: Unable to elicit    Oral/Motor/Sensory Function Overall Oral Motor/Sensory Function: Within functional limits   Ice Chips Ice chips: Not tested   Thin Liquid Thin Liquid: Within functional limits Presentation: Straw    Nectar Thick Nectar Thick Liquid: Not tested   Honey Thick Honey Thick Liquid: Not tested   Puree Puree: Within functional limits Presentation: Spoon   Solid     Solid: Within functional limits      Gwynneth Aliment, M.A., CF-SLP Speech Language Pathology, Acute Rehabilitation Services  Secure Chat preferred [phone]  08/12/2023,11:58 AM

## 2023-08-12 NOTE — Plan of Care (Signed)

## 2023-08-12 NOTE — Consult Note (Signed)
Pharmacy Antibiotic Note  Christine Newton is a 89 y.o. female admitted on 08/11/2023 with aspiration pneumonia.  Pharmacy has been consulted for Unasyn dosing.  Patient had a CT chest performed, confining possible aspiration pneumonia, with possible cough at home. Cultures are still pending. Scr <1, WBC 21.6.   Plan: Unasyn 3g IV q8h  Height: 5\' 6"  (167.6 cm) Weight: 60.1 kg (132 lb 7.9 oz) IBW/kg (Calculated) : 59.3  Temp (24hrs), Avg:97.5 F (36.4 C), Min:97 F (36.1 C), Max:98.2 F (36.8 C)  Recent Labs  Lab 08/11/23 0947 08/11/23 0950 08/11/23 1729 08/11/23 2046 08/11/23 2345 08/12/23 0118  WBC 20.2*  --   --   --  22.6* 21.6*  CREATININE 0.41* 0.50  --   --  0.42* 0.44  LATICACIDVEN  --   --  1.8 0.8  --   --     Estimated Creatinine Clearance: 45.5 mL/min (by C-G formula based on SCr of 0.44 mg/dL).    Allergies  Allergen Reactions   Cephalosporins Nausea Only    Antimicrobials this admission: 8/16 Rocephin  >> 8/17 8/18 Azithromycin >> 8/20 8/17 Unasyn >>   Dose adjustments this admission: N/a  Microbiology results: 8/16 BCx: ngtd 8/16 Ucx: sent  8/17 MRSA PCR: neg  Thank you for allowing pharmacy to be a part of this patient's care.  Merla Riches, PharmD  08/12/2023 9:11 AM

## 2023-08-12 NOTE — Evaluation (Signed)
Occupational Therapy Evaluation Patient Details Name: Christine Newton MRN: [id number] DOB: 1933/07/04 Today's Date: 08/12/2023   History of Present Illness 89 yo female presenting to ED 8/16 from home with AMS, R sided weakness, and lethargy. MRI Brain unremarkable. CXR with bilateral lower lobe atelectasis. Found to have sepsis (likely pna); dehydrationPMH includes dementia, HTN, TIA, DM, CHF, seizures, syncope, s/p aortic valve placement, L hip surgery 09/2022; and LVH   Clinical Impression   At baseline, pt requires assistance for all ADLs and IADLs and uses a Hoyer lift for functional transfers. Pt now presents with decreased activity tolerance, edematous B hands and forearms (worse on Left), generalized B UE weakness, decreased B UE fine and gross motor coordination, and decreased ability to actively participate in functional tasks. Pt currently demonstrates ability to complete UB ADLs with Min to Max assist, LB ADLs with Max to Total assist +2, and bed mobility with Total assist +2. OT instructed daughter this session in positioning pt's B UE to address B edematous hands and forearms with daughter demonstrating understanding. Pt will benefit from acute skilled OT services to address deficits outlined below, decrease caregiver burden, and increase ability to actively participate in ADLs and bed mobility. Just prior to admission pt was receiving skilled OT services in the home. Post acute discharge, pt will benefit from continued skilled OT services in the home to maximize rehab potential.       If plan is discharge home, recommend the following: Two people to help with walking and/or transfers;Two people to help with bathing/dressing/bathroom;Assistance with cooking/housework;Assistance with feeding;Direct supervision/assist for medications management;Direct supervision/assist for financial management;Assist for transportation;Help with stairs or ramp for entrance;Supervision due to cognitive  status    Functional Status Assessment  Patient has had a recent decline in their functional status and demonstrates the ability to make significant improvements in function in a reasonable and predictable amount of time.  Equipment Recommendations  None recommended by OT (Pt already has all needed equipment)    Recommendations for Other Services       Precautions / Restrictions Precautions Precautions: Fall Precaution Comments: per dtr, h/o HO left thigh Restrictions Weight Bearing Restrictions: No      Mobility Bed Mobility Overal bed mobility: Needs Assistance Bed Mobility: Supine to Sit, Sit to Supine     Supine to sit: Total assist, +2 for physical assistance, HOB elevated Sit to supine: Total assist, +2 for physical assistance   General bed mobility comments: pt assists with moving legs over EOB (AAROM), then needs total assist to pivot and raise torso to sitting EOB; full support for sit to supine    Transfers                   General transfer comment: Unable to attempt due to pt's fear and strong posterior lean in sitting      Balance Overall balance assessment: Needs assistance Sitting-balance support: No upper extremity supported, Feet supported Sitting balance-[name]: Poor Sitting balance - Comments: per daughter, very fearful of falling; pt pushing posteriorly but did come forward slightly x1 when dtr asked her to reach for her wallet                                   ADL either performed or assessed with clinical judgement   ADL Overall ADL's : Needs assistance/impaired Eating/Feeding: Minimal assistance;Moderate assistance;Bed level   Grooming: Moderate assistance;Bed level  Upper Body Bathing: Maximal assistance;Bed level   Lower Body Bathing: Total assistance;Bed level;+2 for physical assistance   Upper Body Dressing : Maximal assistance;Bed level   Lower Body Dressing: Total assistance;Bed level;+2 for physical  assistance       Toileting- Clothing Manipulation and Hygiene: Total assistance;Bed level;+2 for physical assistance         General ADL Comments: Pt with decreased activity tolerance and decreased initiation during functional tasks. Pt requires increased time for processing.     Vision Baseline Vision/History: 0 No visual deficits (Hx cataracts with subsequent sx) Ability to See in Adequate Light: 0 Adequate Patient Visual Report: No change from baseline       Perception         Praxis         Pertinent Vitals/Pain Pain Assessment Pain Assessment: No/denies pain     Extremity/Trunk Assessment Upper Extremity Assessment Upper Extremity Assessment: Generalized weakness;Left hand dominant;RUE deficits/detail;LUE deficits/detail RUE Deficits / Details: generalized weakness, edematous hand and forearm (worse on Left), decreased fine and gross motor coordincaiton, AAROM/PROM shoulder flexion to approx 80 degrees RUE Coordination: decreased fine motor;decreased gross motor LUE Deficits / Details: generalized weakness, edematous hand and forearm (worse on Left), decreased fine and gross motor coordincaiton, AAROM/PROM shoulder flexion to approx 90 degrees LUE Coordination: decreased fine motor;decreased gross motor   Lower Extremity Assessment Lower Extremity Assessment: Defer to PT evaluation   Cervical / Trunk Assessment Cervical / Trunk Assessment: Kyphotic   Communication Communication Communication: Difficulty communicating thoughts/reduced clarity of speech Cueing Techniques: Verbal cues;Tactile cues;Visual cues   Cognition Arousal: Alert Behavior During Therapy: WFL for tasks assessed/performed Overall Cognitive Status: History of cognitive impairments - at baseline                                 General Comments: Pt following one step commands with increased time. Pt with impaired sequencing ability and impaired initiation of tasks. Pt provides  incorrect home information and daughter corrects this.     General Comments  Daughter present throughout and reporting goals of HHOT were to increase B UE strength, activity tolareance, and ability to sequence tasks.    Exercises Exercises: General Upper Extremity General Exercises - Upper Extremity Shoulder Flexion: AAROM, Both, 5 reps, Supine, Strengthening (with HOB elevated; to address edematous B UE) Shoulder Extension: AAROM, Both, 5 reps, Supine (with HOB elevated; to address edematous B UE) Elbow Flexion: AAROM, Both, 5 reps, Supine (with HOB elevated; to address edematous B UE) Elbow Extension: AAROM, Both, 5 reps, Supine (with HOB elevated; to address edematous B UE)   Shoulder Instructions      Home Living Family/patient expects to be discharged to:: Private residence Living Arrangements: Children (Daughter) Available Help at Discharge: Family;Other (Comment);Available 24 hours/day (Private aides M-F when daughter is at work) Type of Home: Apartment Home Access: Level entry     Home Layout: One level     Bathroom Shower/Tub: Chief Strategy Officer: Standard Bathroom Accessibility: Yes How Accessible: Accessible via wheelchair;Accessible via walker Home Equipment: BSC/3in1;Tub bench;Other (comment);Hospital bed;Wheelchair - manual;Grab bars - toilet;Grab bars - Chartered loss adjuster (2 wheels);Cane - quad;Hand held shower head [employer] lift, toilet safety frame, Stedy lift)   Additional Comments: The pt lives with her daughter in a ground floor apartment. The pt has a home health nurse 5 days per week, for ~5 hrs each day who assisted her with bathing, lower  body dressing, and household chores. The pt ambulated household distances with supervision using a RW. The pt was independent with feeding & required occasional assistance with toileting.(per prior admission information)      Prior Functioning/Environment Prior Level of Function : Needs assist              Mobility Comments: Pt recently requiring Hoyer lift to w/c; stands at kitchen sink with +2 assist for up to 2 minutes ADLs Comments: Pt recently requiring Max assist with UB ADLs and Total assist with LB ADLs and is currently receiving OT and PT at home        OT Problem List: Decreased strength;Decreased range of motion;Decreased activity tolerance;Impaired balance (sitting and/or standing);Decreased coordination;Increased edema      OT Treatment/Interventions: Self-care/ADL training;Therapeutic exercise;Therapeutic activities;Cognitive remediation/compensation;Balance training;Patient/family education    OT Goals(Current goals can be found in the care plan section) Acute Rehab OT Goals Patient Stated Goal: Pt unable to state. Pt's daughter would like for pt to continue with skilled rehab services to prevent loss of function during acute admission and to continue to work toward goal of pt participating more in functional tasks/transfers. OT Goal Formulation: With patient/family Time For Goal Achievement: 08/26/23 Potential to Achieve Goals: Fair ADL Goals Pt Will Perform Grooming: with min assist;sitting Pt/caregiver will Perform Home Exercise Program: Increased ROM;Both right and left upper extremity;With theraputty;Increased strength;With minimal assist;With written HEP provided (with AROM; Increased activity tolerance) Additional ADL Goal #1: Patient will demonstrate ability to appropriately follow a 2-step command in 6/10 opportunities during a functional or therapeutic task to increase ability to actively participate in ADLs and bed mobility during or in preparation for ADLs.  OT Frequency: Min 1X/week    Co-evaluation PT/OT/SLP Co-Evaluation/Treatment: Yes Reason for Co-Treatment: Complexity of the patient's impairments (multi-system involvement);Necessary to address cognition/behavior during functional activity;For patient/therapist safety;To address functional/ADL  transfers PT goals addressed during session: Mobility/safety with mobility;Balance;Strengthening/ROM OT goals addressed during session: ADL's and self-care;Strengthening/ROM      AM-PAC OT "6 Clicks" Daily Activity     Outcome Measure Help from another person eating meals?: A Little Help from another person taking care of personal grooming?: A Little Help from another person toileting, which includes using toliet, bedpan, or urinal?: Total Help from another person bathing (including washing, rinsing, drying)?: Total Help from another person to put on and taking off regular upper body clothing?: A Lot Help from another person to put on and taking off regular lower body clothing?: Total 6 Click Score: 11   End of Session Nurse Communication: Mobility status  Activity Tolerance: Patient tolerated treatment well;Other (comment) (Pt limited by current cognitive level) Patient left: in bed;with call bell/phone within reach;with bed alarm set;with family/visitor present  OT Visit Diagnosis: Other abnormalities of gait and mobility (R26.89);Muscle weakness (generalized) (M62.81);Ataxia, unspecified (R27.0);Other (comment) (Edematous B UE)                Time: 9629-5284 OT Time Calculation (min): 53 min Charges:  OT General Charges $OT Visit: 1 Visit OT Evaluation $OT Eval Low Complexity: 1 Low OT Treatments $Therapeutic Exercise: 8-22 mins  Kahmari Koller "Orson Eva., OTR/L, MA Acute Rehab [phone]   Lendon Colonel 08/12/2023, 6:27 PM

## 2023-08-12 NOTE — Progress Notes (Signed)
VASCULAR LAB    Bilateral upper extremity venous duplex has been performed.  See CV proc for preliminary results.   Daksh Coates, RVT 08/12/2023, 5:45 PM

## 2023-08-12 NOTE — Progress Notes (Signed)
VASCULAR LAB    Bilateral lower extremity venous duplex has been performed.  See CV proc for preliminary results.   Versa Craton, RVT 08/12/2023, 5:44 PM

## 2023-08-12 NOTE — Evaluation (Signed)
Physical Therapy Evaluation Patient Details Name: Christine Newton MRN: [id number] DOB: 02-12-1933 Today's Date: 08/12/2023  History of Present Illness  89 yo female presenting to ED 8/16 from home with AMS, R sided weakness, and lethargy. MRI Brain unremarkable. CXR with bilateral lower lobe atelectasis. Found to have sepsis (likely pna); dehydrationPMH includes dementia, HTN, TIA, DM, CHF, seizures, syncope, s/p aortic valve placement, L hip surgery 09/2022; and LVH  Clinical Impression   Pt admitted secondary to problem above with deficits below. PTA patient was using a hoyer lift to transfer to wheelchair. With HHPT and daughter's assist, she was working on standing at kitchen sink for up to 2 minutes. HHPT goal is to be able to transfer without use of hoyer lift. Pt currently requires +2 total assist to come to sit EOB, to maintain sitting, and to return to sitting. Patient fearful of falling and pushing posteriorly while seated EOB. Daughter notes this is pt's typical behavior "until she gets to know and trust you." Anticipate patient will benefit from PT to address problems listed below.Will continue to follow acutely to maximize functional mobility independence and safety.           If plan is discharge home, recommend the following: Assistance with cooking/housework;Assistance with feeding;Direct supervision/assist for medications management;Direct supervision/assist for financial management;Assist for transportation   Can travel by private vehicle        Equipment Recommendations None recommended by PT  Recommendations for Other Services       Functional Status Assessment Patient has had a recent decline in their functional status and demonstrates the ability to make significant improvements in function in a reasonable and predictable amount of time.     Precautions / Restrictions Precautions Precautions: Fall Precaution Comments: per dtr, h/o HO left  thigh Restrictions Weight Bearing Restrictions: No      Mobility  Bed Mobility Overal bed mobility: Needs Assistance Bed Mobility: Supine to Sit, Sit to Supine     Supine to sit: Total assist, +2 for physical assistance, HOB elevated Sit to supine: Total assist, +2 for physical assistance   General bed mobility comments: pt assists with moving legs over EOB (AAROM), then needs total assist to pivot and raise torso to sitting EOB; full support for sit to supine    Transfers                   General transfer comment: Unable to attempt due to pt's fear and strong posterior lean in sitting    Ambulation/Gait                  Stairs            Wheelchair Mobility     Tilt Bed    Modified Rankin (Stroke Patients Only)       Balance Overall balance assessment: Needs assistance Sitting-balance support: No upper extremity supported, Feet supported Sitting balance-[name]: Poor Sitting balance - Comments: per daughter, very fearful of falling; pt pushing posteriorly but did come forward slightly x1 when dtr asked her to reach for her wallet                                     Pertinent Vitals/Pain Pain Assessment Pain Assessment: No/denies pain    Home Living Family/patient expects to be discharged to:: Private residence Living Arrangements: Children (Daughter) Available Help at Discharge: Family;Other (Comment);Available 24 hours/day (Private  aides M-F when daughter is at work) Type of Home: Apartment Home Access: Level entry       Home Layout: One level Home Equipment: BSC/3in1;Tub bench;Other (comment);Hospital bed;Wheelchair - manual;Grab bars - toilet;Grab bars - Chartered loss adjuster (2 wheels);Cane - quad;Hand held shower head [employer] lift, toilet safety frame, Stedy lift) Additional Comments: The pt lives with her daughter in a ground floor apartment. The pt has a home health nurse 5 days per week, for ~5 hrs each  day who assisted her with bathing, lower body dressing, and household chores. The pt ambulated household distances with supervision using a RW. The pt was independent with feeding & required occasional assistance with toileting.(information taken from prior admission)    Prior Function Prior Level of Function : Needs assist             Mobility Comments: Hoyer lift to w/c; stands at kitchen sink with +2 assist for up to 2 minutes ADLs Comments: Currently receiving OT and PT at home     Extremity/Trunk Assessment   Upper Extremity Assessment Upper Extremity Assessment: Defer to OT evaluation    Lower Extremity Assessment Lower Extremity Assessment: Generalized weakness;RLE deficits/detail;LLE deficits/detail RLE Deficits / Details: resisting full knee flexion in supine; seated EOB has at lease 90 degrees hip and knee flexion, ankle DF WFL; strength 2+ LLE Deficits / Details: resisting knee flexion in supine; seated EOB knee flexes only 45 degrees (h/o HO in quads per daughter), ankle DF to 0    Cervical / Trunk Assessment Cervical / Trunk Assessment: Kyphotic  Communication   Communication Communication: Difficulty communicating thoughts/reduced clarity of speech Cueing Techniques: Verbal cues;Tactile cues;Visual cues  Cognition Arousal: Alert Behavior During Therapy: WFL for tasks assessed/performed Overall Cognitive Status: History of cognitive impairments - at baseline                                 General Comments: following one step commands; provides incorrect home information and daughter corrects this        General Comments General comments (skin integrity, edema, etc.): Daughter present and reports goals of HHPT were to be able to stand-pivot (perhaps with stedy--pt currently afraid of stedy)    Exercises General Exercises - Lower Extremity Ankle Circles/Pumps: AAROM, Both, 5 reps Heel Slides: AAROM, Both, 10 reps, Supine   Assessment/Plan     PT Assessment Patient needs continued PT services  PT Problem List Decreased strength;Decreased range of motion;Decreased activity tolerance;Decreased balance;Decreased mobility;Decreased cognition;Decreased knowledge of use of DME       PT Treatment Interventions DME instruction;Functional mobility training;Therapeutic activities;Therapeutic exercise;Balance training;Cognitive remediation;Patient/family education;Wheelchair mobility training    PT Goals (Current goals can be found in the Care Plan section)  Acute Rehab PT Goals Patient Stated Goal: be able to stand-pivot to her w/c PT Goal Formulation: With patient/family Time For Goal Achievement: 08/26/23 Potential to Achieve Goals: Good    Frequency Min 1X/week     Co-evaluation PT/OT/SLP Co-Evaluation/Treatment: Yes Reason for Co-Treatment: Complexity of the patient's impairments (multi-system involvement);Necessary to address cognition/behavior during functional activity;For patient/therapist safety;To address functional/ADL transfers PT goals addressed during session: Mobility/safety with mobility;Balance;Strengthening/ROM         AM-PAC PT "6 Clicks" Mobility  Outcome Measure Help needed turning from your back to your side while in a flat bed without using bedrails?: Total Help needed moving from lying on your back to sitting on the side of a flat  bed without using bedrails?: Total Help needed moving to and from a bed to a chair (including a wheelchair)?: Total Help needed standing up from a chair using your arms (e.g., wheelchair or bedside chair)?: Total Help needed to walk in hospital room?: Total Help needed climbing 3-5 steps with a railing? : Total 6 Click Score: 6    End of Session   Activity Tolerance: Other (comment) (limited by fear of falling) Patient left: in bed;with call bell/phone within reach;with bed alarm set;with family/visitor present Nurse Communication: Mobility status PT Visit Diagnosis:  Muscle weakness (generalized) (M62.81);Difficulty in walking, not elsewhere classified (R26.2)    Time: 1610-9604 PT Time Calculation (min) (ACUTE ONLY): 31 min   Charges:   PT Evaluation $PT Eval Low Complexity: 1 Low   PT General Charges $$ ACUTE PT VISIT: 1 Visit          Jerolyn Center, PT Acute Rehabilitation Services  Office [phone]   Zena Amos 08/12/2023, 1:24 PM

## 2023-08-12 NOTE — Progress Notes (Signed)
PROGRESS NOTE                                                                                                                                                                                                             Patient Demographics:    Christine Newton, is a 89 y.o. female, DOB - 07-28-1933, WJX:[id number]  Outpatient Primary MD for the patient is [name], Fritzi Mandes, MD    LOS - 1  Admit date - 08/11/2023    Chief complaint.  Confusion   Brief Narrative (HPI from H&P)   89 year old African-American female with past medical history significant for dementia, hypertension, TIA, diabetes mellitus, diastolic congestive heart failure, seizures, syncope, status post aortic valve replacement and LVH. Patient was noted to have decreased responsiveness earlier today, with reported right-sided weakness, confusion and lethargy. Patient is also reported to have dry cough especially while eating at home, increased nasal congestion.  She had MRI brain which was nonacute, CT chest confirmed possible pneumonia, she also was noted to have left-sided upper and lower extremity swelling and she was admitted for further care.   Subjective:    Christine Newton today has, No headache, No chest pain, No abdominal pain - No Nausea, No new weakness tingling or numbness, no shortness of breath   Assessment  & Plan :   Metabolic encephalopathy in a patient with underlying history of dementia.  Likely due to aspiration pneumonia, MRI brain nonacute, no focal deficits, history suggestive of possible ongoing aspiration at home, placed on appropriate antibiotics, hydrate with IV fluids, follow cultures, speech eval.  No sepsis.  Dehydration and hypokalemia.  Hydrate and replace potassium.  Swollen left sided upper and lower extremities.  Check venous ultrasound, could be dependent edema, monitor.  Incidental thyroid nodule.  Outpatient thyroid ultrasound by PCP as  desired.  Hypertension.  Placed on Norvasc.  History of chronic diastolic CHF, previous history of aortic valve replacement, history of underlying left bundle branch block.  Compensated, no acute issues.  EF 60% on echocardiogram 1 year ago, monitor.  Dyslipidemia.  On statin.      Condition -  Guarded  Family Communication  : Daughter bedside in detail on 08/12/2023  Code Status : DNR  Consults  : None  PUD Prophylaxis : None   Procedures  :     Upper and lower  extremity venous ultrasound.     CT chest.  . Small amount of patchy airspace disease in the inferior left upper lobe may be infectious/inflammatory. 2. Bilateral lower lobe atelectasis. 3. Incidental right thyroid nodule with heterogeneous and enlarged thyroid measuring 1 cm. Recommend non-emergent thyroid ultrasound if clinically warranted given patient age.  MRI brain. : 1. Motion degraded brain MRI without acute finding including infarct. 2. Chronic small vessel disease and remote left frontal and parietal cortex infarcts. Generalized brain atrophy. 3. Known pituitary adenoma with decreased size since 2021.      Disposition Plan  :    Status is: Inpatient  DVT Prophylaxis  :    enoxaparin (LOVENOX) injection 40 mg Start: 08/12/23 0800  Lab Results  Component Value Date   PLT 231 08/12/2023    Diet :  Diet Order             Diet NPO time specified Except for: Sips with Meds  Diet effective now                    Inpatient Medications  Scheduled Meds:  amLODipine  10 mg Oral Daily   aspirin EC  81 mg Oral Daily   atorvastatin  40 mg Oral Daily   divalproex  250 mg Oral Daily   enoxaparin (LOVENOX) injection  40 mg Subcutaneous Q24H   insulin aspart  0-9 Units Subcutaneous Q4H   metoprolol tartrate  12.5 mg Oral BID   Continuous Infusions:  azithromycin Stopped (08/11/23 2031)   lactated ringers 1,000 mL with potassium chloride 40 mEq infusion 110 mL/hr at 08/12/23 0704   PRN  Meds:.hydrALAZINE    Objective:   Vitals:   08/12/23 0000 08/12/23 0048 08/12/23 0410 08/12/23 0421  BP:  131/62 (!) 160/79   Pulse:  95 67   Resp:  16 16   Temp:    (!) 97.2 F (36.2 C)  TempSrc:      SpO2:  95% 95%   Weight: 60.1 kg     Height:        Wt Readings from Last 3 Encounters:  08/12/23 60.1 kg  06/16/23 63.5 kg  01/31/23 65.8 kg     Intake/Output Summary (Last 24 hours) at 08/12/2023 0807 Last data filed at 08/12/2023 0551 Gross per 24 hour  Intake 383.28 ml  Output 450 ml  Net -66.72 ml     Physical Exam  Awake Alert, No new F.N deficits, Normal affect [name],PERRAL Supple Neck, No JVD,   Symmetrical Chest wall movement, Good air movement bilaterally, CTAB RRR,No Gallops,Rubs or new Murmurs,  +ve B.Sounds, Abd Soft, No tenderness,   Left upper and lower extremities swollen, chronic contractures in the left lower extremity, ankle protective boots on both feet      Data Review:    Recent Labs  Lab 08/11/23 0947 08/11/23 0950 08/11/23 2345 08/12/23 0118  WBC 20.2*  --  22.6* 21.6*  HGB 12.1 12.2 11.8* 11.5*  HCT 39.0 36.0 36.4 37.1  PLT 237  --  153 231  MCV 97.0  --  93.8 94.6  MCH 30.1  --  30.4 29.3  MCHC 31.0  --  32.4 31.0  RDW 17.6*  --  17.5* 17.6*  LYMPHSABS 3.1  --   --   --   MONOABS 1.5*  --   --   --   EOSABS 0.0  --   --   --   BASOSABS 0.1  --   --   --  Recent Labs  Lab 08/11/23 0947 08/11/23 0950 08/11/23 1729 08/11/23 2038 08/11/23 2046 08/11/23 2345 08/12/23 0118  NA 144 146*  --   --   --   --  143  K 3.4* 3.1*  --   --   --   --  2.9*  CL 105 106  --   --   --   --  107  CO2 26  --   --   --   --   --  26  ANIONGAP 13  --   --   --   --   --  10  GLUCOSE 100* 102*  --   --   --   --  92  BUN 22 23  --   --   --   --  19  CREATININE 0.41* 0.50  --   --   --  0.42* 0.44  AST 20  --   --   --   --  17  --   ALT 17  --   --   --   --  18  --   ALKPHOS 53  --   --   --   --  54  --   BILITOT 1.3*  --    --   --   --  0.4  --   ALBUMIN 2.4*  --   --   --   --  2.2*  --   PROCALCITON  --   --   --  <0.10  --   --   --   LATICACIDVEN  --   --  1.8  --  0.8  --   --   INR 1.1  --   --   --   --   --   --   HGBA1C  --   --   --  5.0  --   --   --   MG  --   --   --  1.9  --  2.0 2.1  CALCIUM 8.0*  --   --   --   --   --  8.0*      Recent Labs  Lab 08/11/23 0947 08/11/23 1729 08/11/23 2038 08/11/23 2046 08/11/23 2345 08/12/23 0118  PROCALCITON  --   --  <0.10  --   --   --   LATICACIDVEN  --  1.8  --  0.8  --   --   INR 1.1  --   --   --   --   --   HGBA1C  --   --  5.0  --   --   --   MG  --   --  1.9  --  2.0 2.1  CALCIUM 8.0*  --   --   --   --  8.0*     Micro Results Recent Results (from the past 240 hour(s))  SARS Coronavirus 2 by RT PCR (hospital order, performed in [facility] hospital lab) *cepheid single result test* Anterior Nasal Swab     Status: None   Collection Time: 08/11/23  1:10 PM   Specimen: Anterior Nasal Swab  Result Value Ref Range Status   SARS Coronavirus 2 by RT PCR NEGATIVE NEGATIVE Final    Comment: Performed at [facility], [address], [place], [place] [address]  Culture, blood (single)     Status: None (Preliminary result)   Collection Time: 08/11/23  5:22 PM  Specimen: BLOOD LEFT HAND  Result Value Ref Range Status   Specimen Description BLOOD LEFT HAND  Final   Special Requests   Final    BOTTLES DRAWN AEROBIC AND ANAEROBIC Blood Culture adequate volume   Culture   Final    NO GROWTH < 12 HOURS Performed at [facility], [address], [place], [place] [address]    Report Status PENDING  Incomplete    Radiology Reports CT CHEST WO CONTRAST  Result Date: 08/11/2023 CLINICAL DATA:  Respiratory illness EXAM: CT CHEST WITHOUT CONTRAST TECHNIQUE: Multidetector CT imaging of the chest was performed following the standard protocol without IV contrast. RADIATION DOSE REDUCTION: This exam was performed according to the  departmental dose-optimization program which includes automated exposure control, adjustment of the mA and/or kV according to patient size and/or use of iterative reconstruction technique. COMPARISON:  None Available. FINDINGS: Cardiovascular: No significant vascular findings. Normal heart size. No pericardial effusion. There are atherosclerotic calcifications of the aorta and coronary arteries. Mediastinum/Nodes: Thyroid gland is heterogeneous and enlarged, particularly the left thyroid. Multiple calcifications are present. This causes shift of the trachea to the right proximally 1 cm. Trachea is widely patent. There are no enlarged mediastinal or hilar lymph nodes. Esophagus is unremarkable. Lungs/Pleura: There is atelectasis in the bilateral lower lobes. There is a small amount of patchy airspace disease in the inferior left upper lobe. There is no pneumothorax or pleural effusion. Upper Abdomen: Bilateral renal calculi are present. Musculoskeletal: The bones are osteopenic. T7 compression deformity is age indeterminate (moderate). IMPRESSION: 1. Small amount of patchy airspace disease in the inferior left upper lobe may be infectious/inflammatory. 2. Bilateral lower lobe atelectasis. 3. Incidental right thyroid nodule with heterogeneous and enlarged thyroid measuring 1 cm. Recommend non-emergent thyroid ultrasound if clinically warranted given patient age. Reference: J Am Coll Radiol. 2015 Feb;12(2): 143-50 4. Aortic Atherosclerosis (ICD10-I70.0). Electronically Signed   By: Darliss Cheney M.D.   On: 08/11/2023 17:26   DG Chest 1 View  Result Date: 08/11/2023 CLINICAL DATA:  Altered mental status EXAM: CHEST  1 VIEW COMPARISON:  Chest radiograph dated 01/31/2023 FINDINGS: Low lung volumes. Left basilar patchy opacities. Unchanged blunting of the left costophrenic angle. No pneumothorax. Similar cardiomediastinal silhouette status post valve replacement. No acute osseous abnormality. IMPRESSION: 1. Left  basilar patchy opacities, which may represent atelectasis, aspiration, or pneumonia. 2. Unchanged blunting of the left costophrenic angle, which may represent a small pleural effusion. Electronically Signed   By: Agustin Cree M.D.   On: 08/11/2023 15:14   MR BRAIN WO CONTRAST  Result Date: 08/11/2023 CLINICAL DATA:  Sudden onset of left-sided facial droop and right-sided weakness. EXAM: MRI HEAD WITHOUT CONTRAST TECHNIQUE: Multiplanar, multiecho pulse sequences of the brain and surrounding structures were obtained without intravenous contrast. COMPARISON:  Head CT from earlier today FINDINGS: Brain: No acute infarction, hemorrhage, hydrocephalus, extra-axial collection or mass effect. Generalized atrophy, cortical thinning is advanced. Chronic left superior frontal and parietal cortically based infarcts. Small chronic bilateral cerebellar infarcts. Ischemic gliosis bilaterally in the deep cerebral white matter. There is a lobulated sellar and suprasellar mass indistinguishable from the pituitary gland and involving the left cavernous sinus, up to 2.3 cm on sagittal T1 weighted imaging, most consistent with pituitary adenoma and known from multiple prior studies. The mass appears contracted when compared to 2021. Vascular: Major flow voids are preserved Skull and upper cervical spine: No focal marrow lesion. Sinuses/Orbits: No acute finding Other: Intermittently moderate motion artifact. IMPRESSION: 1. Motion  degraded brain MRI without acute finding including infarct. 2. Chronic small vessel disease and remote left frontal and parietal cortex infarcts. Generalized brain atrophy. 3. Known pituitary adenoma with decreased size since 2021. Electronically Signed   By: Tiburcio Pea M.D.   On: 08/11/2023 13:31   CT HEAD CODE STROKE WO CONTRAST  Result Date: 08/11/2023 CLINICAL DATA:  Code stroke. Neuro deficit, acute, stroke suspected. Left-sided facial droop. Left-sided weakness. EXAM: CT HEAD WITHOUT CONTRAST  TECHNIQUE: Contiguous axial images were obtained from the base of the skull through the vertex without intravenous contrast. RADIATION DOSE REDUCTION: This exam was performed according to the departmental dose-optimization program which includes automated exposure control, adjustment of the mA and/or kV according to patient size and/or use of iterative reconstruction technique. COMPARISON:  CT head without contrast 02/02/2023. MR head without contrast 11/18/2022. FINDINGS: Brain: A remote cortical infarct of the left parietal lobe is stable. Remote lacunar infarcts of the caudate head bilaterally are stable. No acute infarct, hemorrhage, or mass lesion is present. The ventricles are proportionate to the degree of atrophy. No significant extraaxial fluid collection is present. Remote lacunar infarcts are again noted in the cerebellum bilaterally. The brainstem is unremarkable. A 1.6 cm sellar and suprasellar mass lesion is again noted. Vascular: Atherosclerotic calcifications are present in the cavernous internal carotid arteries. No hyperdense vessel is present. Skull: Calvarium is intact. No focal lytic or blastic lesions are present. No significant extracranial soft tissue lesion is present. Sinuses/Orbits: The paranasal sinuses and mastoid air cells are clear. Bilateral lens replacements are noted. Globes and orbits are otherwise unremarkable. ASPECTS [facility] Stroke Program Early CT Score) - Ganglionic level infarction (caudate, lentiform nuclei, internal capsule, insula, M1-M3 cortex): 7/7 - Supraganglionic infarction (M4-M6 cortex): 3/3 Total score (0-10 with 10 being normal): 10/10 IMPRESSION: 1. No acute intracranial abnormality or significant interval change. 2. 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 3. Stable remote lacunar infarcts of the caudate head bilaterally and cerebellum bilaterally. 4. Stable 1.6 cm sellar and suprasellar mass lesion. 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 The above was relayed via text pager to Dr. Selina Cooley on 08/11/2023 at 10:07 . Electronically Signed   By: Marin Roberts M.D.   On: 08/11/2023 10:07      Signature  -   Susa Raring M.D on 08/12/2023 at 8:07 AM   -  To page go to [email or web address]

## 2023-08-12 NOTE — Progress Notes (Addendum)
Bedside nurse reported that he noticed ST elevation at V6 lead at bedside telemonitoring. -At presentation to ED EKG obtained which showed sinus rhythm heart rate 77, evidence of right atrial enlargement and left bundle branch block.  Repeat EKG at 10 PM unchanged finding.  No concern for ST and T wave abnormality.  No concern for STEMI or NSTMI. - Patient denies any chest pain. - High sensitive troponin 29>31. -Unclear etiology of troponin elevation except demand ischemia in the setting of sepsis versus pneumonia unknown source of infection at this time.   -Continue to trend troponin and monitor patient for development of any chest pain. -Patient is already on aspirin 81 mg daily.

## 2023-08-12 NOTE — Progress Notes (Signed)
Lab was unable to get blood draw this am.  Two techs tried and the lab coordinator also came up and gave it a try and was unsuccessful.  I sent Dr. Thedore Mins a message and he advised to have them retry around 1500 today.

## 2023-08-12 NOTE — ED Provider Notes (Signed)
.  Critical Care  Performed by: Franne Forts, DO Authorized by: Franne Forts, DO   Critical care provider statement:    Critical care time (minutes):  75   Critical care was necessary to treat or prevent imminent or life-threatening deterioration of the following conditions:  Sepsis   Critical care was time spent personally by me on the following activities:  Development of treatment plan with patient or surrogate, discussions with consultants, evaluation of patient's response to treatment, examination of patient, ordering and review of laboratory studies, ordering and review of radiographic studies, ordering and performing treatments and interventions, pulse oximetry, re-evaluation of patient's condition and review of old charts   Care discussed with comment:  Neurology, admitting provider      Franne Forts, DO 08/12/23 0725

## 2023-08-13 DIAGNOSIS — R4182 Altered mental status, unspecified: Secondary | ICD-10-CM | POA: Diagnosis not present

## 2023-08-13 DIAGNOSIS — J189 Pneumonia, unspecified organism: Secondary | ICD-10-CM | POA: Diagnosis not present

## 2023-08-13 LAB — BLOOD CULTURE ID PANEL (REFLEXED) - BCID2

## 2023-08-13 LAB — CBC WITH DIFFERENTIAL/PLATELET
Abs Immature Granulocytes: 0.42 10*3/uL — ABNORMAL HIGH (ref 0.00–0.07)
Basophils Absolute: 0.1 10*3/uL (ref 0.0–0.1)
Basophils Relative: 0 %
Eosinophils Absolute: 0 10*3/uL (ref 0.0–0.5)
Eosinophils Relative: 0 %
HCT: 32.3 % — ABNORMAL LOW (ref 36.0–46.0)
Hemoglobin: 10.3 g/dL — ABNORMAL LOW (ref 12.0–15.0)
Immature Granulocytes: 2 %
Lymphocytes Relative: 6 %
Lymphs Abs: 1.1 10*3/uL (ref 0.7–4.0)
MCH: 30.2 pg (ref 26.0–34.0)
MCHC: 31.9 g/dL (ref 30.0–36.0)
MCV: 94.7 fL (ref 80.0–100.0)
Monocytes Absolute: 0.9 10*3/uL (ref 0.1–1.0)
Monocytes Relative: 5 %
Neutro Abs: 16 10*3/uL — ABNORMAL HIGH (ref 1.7–7.7)
Neutrophils Relative %: 87 %
Platelets: 246 10*3/uL (ref 150–400)
RBC: 3.41 MIL/uL — ABNORMAL LOW (ref 3.87–5.11)
RDW: 17.2 % — ABNORMAL HIGH (ref 11.5–15.5)
WBC: 18.4 10*3/uL — ABNORMAL HIGH (ref 4.0–10.5)
nRBC: 0.5 % — ABNORMAL HIGH (ref 0.0–0.2)

## 2023-08-13 LAB — BASIC METABOLIC PANEL
Anion gap: 11 (ref 5–15)
BUN: 14 mg/dL (ref 8–23)
CO2: 21 mmol/L — ABNORMAL LOW (ref 22–32)
Calcium: 7.9 mg/dL — ABNORMAL LOW (ref 8.9–10.3)
Chloride: 110 mmol/L (ref 98–111)
Creatinine, Ser: 0.48 mg/dL (ref 0.44–1.00)
GFR, Estimated: >60 mL/min (ref >60)
Glucose, Bld: 120 mg/dL — ABNORMAL HIGH (ref 70–99)
Potassium: 3.6 mmol/L (ref 3.5–5.1)
Sodium: 142 mmol/L (ref 135–145)

## 2023-08-13 LAB — GLUCOSE, CAPILLARY
Glucose-Capillary: 107 mg/dL — ABNORMAL HIGH (ref 70–99)
Glucose-Capillary: 109 mg/dL — ABNORMAL HIGH (ref 70–99)
Glucose-Capillary: 98 mg/dL (ref 70–99)
Glucose-Capillary: 118 mg/dL — ABNORMAL HIGH (ref 70–99)
Glucose-Capillary: 103 mg/dL — ABNORMAL HIGH (ref 70–99)

## 2023-08-13 LAB — MAGNESIUM: Magnesium: 1.8 mg/dL (ref 1.7–2.4)

## 2023-08-13 LAB — URINE CULTURE: Culture: 40000 — AB

## 2023-08-13 LAB — C-REACTIVE PROTEIN: CRP: 6.5 mg/dL — ABNORMAL HIGH (ref <1.0)

## 2023-08-13 LAB — BRAIN NATRIURETIC PEPTIDE: B Natriuretic Peptide: 399.1 pg/mL — ABNORMAL HIGH (ref 0.0–100.0)

## 2023-08-13 MED ORDER — FUROSEMIDE 10 MG/ML IJ SOLN
40.0000 mg | Freq: Once | INTRAMUSCULAR | Status: AC
  Administered 2023-08-13: 40 mg via INTRAVENOUS
  Filled 2023-08-13: qty 4

## 2023-08-13 MED ORDER — POTASSIUM CHLORIDE CRYS ER 20 MEQ PO TBCR
40.0000 meq | EXTENDED_RELEASE_TABLET | Freq: Once | ORAL | Status: AC
  Administered 2023-08-13: 40 meq via ORAL
  Filled 2023-08-13: qty 2

## 2023-08-13 MED ORDER — LEVALBUTEROL HCL 0.63 MG/3ML IN NEBU: 0.6300 mg | INHALATION_SOLUTION | Freq: Four times a day (QID) | RESPIRATORY_TRACT | Status: DC | PRN

## 2023-08-13 NOTE — Progress Notes (Signed)
Bedside nurse reported that patient O2 sat dropped to lower 90s and patient is tachypneic 30.  He has been admitted for acute metabolic encephalopathy likely secondary from aspiration pneumonia.  Chest x-ray showed hypoinflation with a stable small pleural effusion likely with associated left basilar atelectasis.  Stable right basilar atelectasis - Plan to continue check pulse ox, continues on cannula oxygen 3 to 4 L to maintain O2 sat above 92% and ordered Xopenex and 6-hour as needed for shortness of breath or wheezing.  -Patient is confused she has baseline dementia.  Family at the bedside for reorientation.   Tereasa Coop, MD [facility] 08/13/2023, 1:16 AM

## 2023-08-13 NOTE — Progress Notes (Addendum)
PROGRESS NOTE                                                                                                                                                                                                             Patient Demographics:    Christine Newton, is a 89 y.o. female, DOB - [birth year], MVH:[id number]  Outpatient Primary MD for the patient is [name], Fritzi Mandes, MD    LOS - 2  Admit date - 08/11/2023    Chief complaint.  Confusion   Brief Narrative (HPI from H&P)   89 year old African-American female with past medical history significant for dementia, hypertension, TIA, diabetes mellitus, diastolic congestive heart failure, seizures, syncope, status post aortic valve replacement and LVH. Patient was noted to have decreased responsiveness earlier today, with reported right-sided weakness, confusion and lethargy. Patient is also reported to have dry cough especially while eating at home, increased nasal congestion.  She had MRI brain which was nonacute, CT chest confirmed possible pneumonia, she also was noted to have left-sided upper and lower extremity swelling and she was admitted for further care.   Subjective:   Patient in bed, appears comfortable, denies any headache, no fever, no chest pain or pressure, no shortness of breath , no abdominal pain. No focal weakness.   Assessment  & Plan :   Metabolic encephalopathy in a patient with underlying history of dementia.  Likely due to aspiration pneumonia, MRI brain nonacute, no focal deficits, history suggestive of possible ongoing aspiration at home, placed on appropriate antibiotics, hydrated with IV fluids, follow cultures, BC ID likely suggestive of contaminant, speech eval.  No sepsis.  She is clinically improving continue to monitor.  Dehydration and hypokalemia.  Hydrate and replace potassium.  Swollen left sided upper and lower extremities.  Venous duplex negative for  upper and lower extremity, this appears to be dependent edema, challenge with low-dose Lasix and monitor.  Incidental thyroid nodule.  Outpatient thyroid ultrasound by PCP as desired.  Hypertension.  Placed on Norvasc.  History of chronic diastolic CHF, previous history of aortic valve replacement, history of underlying left bundle branch block.  Compensated, no acute issues.  EF 60% on echocardiogram 1 year ago, monitor.  Dyslipidemia.  On statin.      Condition -  Guarded  Family Communication  : Daughter bedside in detail on 08/12/2023, 08/13/2023  Code Status : DNR  Consults  : None  PUD Prophylaxis : None   Procedures  :     Upper and lower extremity venous ultrasound.     CT chest.  . Small amount of patchy airspace disease in the inferior left upper lobe may be infectious/inflammatory. 2. Bilateral lower lobe atelectasis. 3. Incidental right thyroid nodule with heterogeneous and enlarged thyroid measuring 1 cm. Recommend non-emergent thyroid ultrasound if clinically warranted given patient age.  MRI brain. : 1. Motion degraded brain MRI without acute finding including infarct. 2. Chronic small vessel disease and remote left frontal and parietal cortex infarcts. Generalized brain atrophy. 3. Known pituitary adenoma with decreased size since 2021.      Disposition Plan  :    Status is: Inpatient  DVT Prophylaxis  :    enoxaparin (LOVENOX) injection 40 mg Start: 08/12/23 0800  Lab Results  Component Value Date   PLT 246 08/13/2023    Diet :  Diet Order             DIET DYS 3 Room service appropriate? Yes; Fluid consistency: Thin  Diet effective now                    Inpatient Medications  Scheduled Meds:  amLODipine  10 mg Oral Daily   aspirin EC  81 mg Oral Daily   atorvastatin  40 mg Oral Daily   divalproex  250 mg Oral Daily   enoxaparin (LOVENOX) injection  40 mg Subcutaneous Q24H   furosemide  40 mg Intravenous Once   insulin aspart  0-9  Units Subcutaneous Q4H   metoprolol tartrate  12.5 mg Oral BID   potassium chloride  40 mEq Oral Once   Continuous Infusions:  ampicillin-sulbactam (UNASYN) IV 3 g (08/13/23 0909)   azithromycin 500 mg (08/12/23 1422)   PRN Meds:.hydrALAZINE, levalbuterol    Objective:   Vitals:   08/12/23 1943 08/12/23 2339 08/13/23 0417 08/13/23 0800  BP: (!) 147/81 (!) 149/85 (!) 122/93   Pulse: 81 65 83   Resp: (!) 22 15 15    Temp: 97.9 F (36.6 C)  (!) 97.4 F (36.3 C) 98 F (36.7 C)  TempSrc: Oral Oral Oral Oral  SpO2: 94% 97% 100%   Weight:      Height:        Wt Readings from Last 3 Encounters:  08/12/23 60.1 kg  06/16/23 63.5 kg  01/31/23 65.8 kg     Intake/Output Summary (Last 24 hours) at 08/13/2023 0944 Last data filed at 08/12/2023 2300 Gross per 24 hour  Intake 2349 ml  Output --  Net 2349 ml     Physical Exam  Awake Alert, No new F.N deficits, Normal affect [place].AT,PERRAL Supple Neck, No JVD,   Symmetrical Chest wall movement, Good air movement bilaterally, few rales RRR,No Gallops,Rubs or new Murmurs,  +ve B.Sounds, Abd Soft, No tenderness,   Left upper and lower extremities swollen, chronic contractures in the left lower extremity, ankle protective boots on both feet      Data Review:    Recent Labs  Lab 08/11/23 0947 08/11/23 0950 08/11/23 2345 08/12/23 0118 08/13/23 0557  WBC 20.2*  --  22.6* 21.6* 18.4*  HGB 12.1 12.2 11.8* 11.5* 10.3*  HCT 39.0 36.0 36.4 37.1 32.3*  PLT 237  --  153 231 246  MCV 97.0  --  93.8 94.6 94.7  MCH 30.1  --  30.4 29.3 30.2  MCHC 31.0  --  32.4 31.0 31.9  RDW 17.6*  --  17.5* 17.6* 17.2*  LYMPHSABS 3.1  --   --   --  1.1  MONOABS 1.5*  --   --   --  0.9  EOSABS 0.0  --   --   --  0.0  BASOSABS 0.1  --   --   --  0.1    Recent Labs  Lab 08/11/23 0947 08/11/23 0950 08/11/23 1729 08/11/23 2038 08/11/23 2046 08/11/23 2345 08/12/23 0118 08/12/23 1453 08/13/23 0557  NA 144 146*  --   --   --   --  143  --   142  K 3.4* 3.1*  --   --   --   --  2.9*  --  3.6  CL 105 106  --   --   --   --  107  --  110  CO2 26  --   --   --   --   --  26  --  21*  ANIONGAP 13  --   --   --   --   --  10  --  11  GLUCOSE 100* 102*  --   --   --   --  92  --  120*  BUN 22 23  --   --   --   --  19  --  14  CREATININE 0.41* 0.50  --   --   --  0.42* 0.44  --  0.48  AST 20  --   --   --   --  17  --   --   --   ALT 17  --   --   --   --  18  --   --   --   ALKPHOS 53  --   --   --   --  54  --   --   --   BILITOT 1.3*  --   --   --   --  0.4  --   --   --   ALBUMIN 2.4*  --   --   --   --  2.2*  --   --   --   CRP  --   --   --   --   --   --   --  11.4* 6.5*  PROCALCITON  --   --   --  <0.10  --   --   --   --   --   LATICACIDVEN  --   --  1.8  --  0.8  --   --   --   --   INR 1.1  --   --   --   --   --   --   --   --   HGBA1C  --   --   --  5.0  --   --   --   --   --   BNP  --   --   --   --   --   --   --  1,141.9* 399.1*  MG  --   --   --  1.9  --  2.0 2.1  --  1.8  CALCIUM 8.0*  --   --   --   --   --  8.0*  --  7.9*      Recent Labs  Lab 08/11/23 0947 08/11/23 1729 08/11/23 2038  08/11/23 2046 08/11/23 2345 08/12/23 0118 08/12/23 1453 08/13/23 0557  CRP  --   --   --   --   --   --  11.4* 6.5*  PROCALCITON  --   --  <0.10  --   --   --   --   --   LATICACIDVEN  --  1.8  --  0.8  --   --   --   --   INR 1.1  --   --   --   --   --   --   --   HGBA1C  --   --  5.0  --   --   --   --   --   BNP  --   --   --   --   --   --  1,141.9* 399.1*  MG  --   --  1.9  --  2.0 2.1  --  1.8  CALCIUM 8.0*  --   --   --   --  8.0*  --  7.9*     Micro Results Recent Results (from the past 240 hour(s))  Urine Culture     Status: None (Preliminary result)   Collection Time: 08/11/23 11:46 AM   Specimen: In/Out Cath Urine  Result Value Ref Range Status   Specimen Description IN/OUT CATH URINE  Final   Special Requests NONE  Final   Culture   Final    CULTURE REINCUBATED FOR BETTER GROWTH Performed at  [facility], [address] [place], [place] [address]    Report Status PENDING  Incomplete  SARS Coronavirus 2 by RT PCR (hospital order, performed in [facility] hospital lab) *cepheid single result test* Anterior Nasal Swab     Status: None   Collection Time: 08/11/23  1:10 PM   Specimen: Anterior Nasal Swab  Result Value Ref Range Status   SARS Coronavirus 2 by RT PCR NEGATIVE NEGATIVE Final    Comment: Performed at [facility], [address], [place], [place] [address]  Culture, blood (single)     Status: None (Preliminary result)   Collection Time: 08/11/23  5:22 PM   Specimen: BLOOD LEFT HAND  Result Value Ref Range Status   Specimen Description BLOOD LEFT HAND  Final   Special Requests   Final    BOTTLES DRAWN AEROBIC AND ANAEROBIC Blood Culture adequate volume   Culture  Setup Time   Final    GRAM POSITIVE COCCI AEROBIC BOTTLE ONLY Organism ID to follow CRITICAL RESULT CALLED TO, READ BACK BY AND VERIFIED WITH: K HURTH,[id number]  08/13/23 MK    Culture   Final    NO GROWTH < 12 HOURS Performed at [facility], [address] [place], [place] [address]    Report Status PENDING  Incomplete  Blood Culture ID Panel (Reflexed)     Status: Abnormal   Collection Time: 08/11/23  5:22 PM  Result Value Ref Range Status   Enterococcus faecalis NOT DETECTED NOT DETECTED Final   Enterococcus Faecium NOT DETECTED NOT DETECTED Final   Listeria monocytogenes NOT DETECTED NOT DETECTED Final   Staphylococcus species DETECTED (A) NOT DETECTED Final    Comment: CRITICAL RESULT CALLED TO, READ BACK BY AND VERIFIED WITH: K HURTH,[email or web address]@[id number]  08/13/23 MK    Staphylococcus aureus (BCID) NOT DETECTED NOT DETECTED Final   Staphylococcus epidermidis NOT DETECTED NOT DETECTED Final   Staphylococcus lugdunensis NOT DETECTED NOT DETECTED Final  Streptococcus species NOT DETECTED NOT DETECTED Final   Streptococcus agalactiae NOT DETECTED NOT DETECTED Final   Streptococcus  pneumoniae NOT DETECTED NOT DETECTED Final   Streptococcus pyogenes NOT DETECTED NOT DETECTED Final   A.calcoaceticus-baumannii NOT DETECTED NOT DETECTED Final   Bacteroides fragilis NOT DETECTED NOT DETECTED Final   Enterobacterales NOT DETECTED NOT DETECTED Final   Enterobacter cloacae complex NOT DETECTED NOT DETECTED Final   Escherichia coli NOT DETECTED NOT DETECTED Final   Klebsiella aerogenes NOT DETECTED NOT DETECTED Final   Klebsiella oxytoca NOT DETECTED NOT DETECTED Final   Klebsiella pneumoniae NOT DETECTED NOT DETECTED Final   Proteus species NOT DETECTED NOT DETECTED Final   Salmonella species NOT DETECTED NOT DETECTED Final   Serratia marcescens NOT DETECTED NOT DETECTED Final   Haemophilus influenzae NOT DETECTED NOT DETECTED Final   Neisseria meningitidis NOT DETECTED NOT DETECTED Final   Pseudomonas aeruginosa NOT DETECTED NOT DETECTED Final   Stenotrophomonas maltophilia NOT DETECTED NOT DETECTED Final   Candida albicans NOT DETECTED NOT DETECTED Final   Candida auris NOT DETECTED NOT DETECTED Final   Candida glabrata NOT DETECTED NOT DETECTED Final   Candida krusei NOT DETECTED NOT DETECTED Final   Candida parapsilosis NOT DETECTED NOT DETECTED Final   Candida tropicalis NOT DETECTED NOT DETECTED Final   Cryptococcus neoformans/gattii NOT DETECTED NOT DETECTED Final    Comment: Performed at [facility], [address], [place], [place] [address]  MRSA Next Gen by PCR, Nasal     Status: None   Collection Time: 08/12/23  5:38 AM   Specimen: Nasal Mucosa; Nasal Swab  Result Value Ref Range Status   MRSA by PCR Next Gen NOT DETECTED NOT DETECTED Final    Comment: (NOTE) The GeneXpert MRSA Assay (FDA approved for NASAL specimens only), is one component of a comprehensive MRSA colonization surveillance program. It is not intended to diagnose MRSA infection nor to guide or monitor treatment for MRSA infections. Test performance is not FDA approved in patients  less than 49 years old. Performed at [facility], [address], [place], [place] [address]     Radiology Reports VAS [name] UPPER EXTREMITY VENOUS DUPLEX  Result Date: 08/12/2023 UPPER VENOUS STUDY  Patient Name:  Christine Newton  Date of Exam:   08/12/2023 Medical Rec #: [id number]            Accession #:    [id number] Date of Birth: 08/25/1933            Patient Gender: F Patient Age:   69 years Exam Location:  [facility] Procedure:      VAS [name] UPPER EXTREMITY VENOUS DUPLEX Referring Phys: Jacqlyn Krauss OGBATA --------------------------------------------------------------------------------  Indications: Edema Limitations: Body habitus, poor ultrasound/tissue interface, bandages, line and Subcutaneous edema, patient's altered mental status (patient asking me to stop exam multiple times). Comparison Study: No prior study Performing Technologist: Sherren Kerns RVS  Examination Guidelines: A complete evaluation includes B-mode imaging, spectral Doppler, color Doppler, and power Doppler as needed of all accessible portions of each vessel. Bilateral testing is considered an integral part of a complete examination. Limited examinations for reoccurring indications may be performed as noted.  Right Findings: +----------+------------+---------+-----------+----------+---------------------+ RIGHT     CompressiblePhasicitySpontaneousProperties       Summary        +----------+------------+---------+-----------+----------+---------------------+ IJV           Full       Yes       Yes                                    +----------+------------+---------+-----------+----------+---------------------+  Subclavian               Yes       Yes                                    +----------+------------+---------+-----------+----------+---------------------+ Axillary                 Yes       Yes                                     +----------+------------+---------+-----------+----------+---------------------+ Brachial      Full       Yes       Yes                                    +----------+------------+---------+-----------+----------+---------------------+ Cephalic      Full                                                        +----------+------------+---------+-----------+----------+---------------------+ Basilic                  Yes       Yes               patent by color and                                                             Doppler        +----------+------------+---------+-----------+----------+---------------------+  Left Findings: +----------+------------+---------+-----------+----------+--------------+ LEFT      CompressiblePhasicitySpontaneousProperties   Summary     +----------+------------+---------+-----------+----------+--------------+ IJV           Full       Yes       Yes                             +----------+------------+---------+-----------+----------+--------------+ Subclavian    Full       Yes       Yes                             +----------+------------+---------+-----------+----------+--------------+ Brachial      Full       Yes       Yes                             +----------+------------+---------+-----------+----------+--------------+ Radial                                              Not visualized +----------+------------+---------+-----------+----------+--------------+ Ulnar  Not visualized +----------+------------+---------+-----------+----------+--------------+ Cephalic                                            Not visualized +----------+------------+---------+-----------+----------+--------------+ Basilic                                             Not visualized +----------+------------+---------+-----------+----------+--------------+  Summary:  Right: No  evidence of deep vein thrombosis in the upper extremity. No evidence of superficial vein thrombosis in the upper extremity.  Left: No DVT or SVT noted in the visualized veins of the left upper extremity. Significant subcutaneous edema noted.  *See table(s) above for measurements and observations.    Preliminary    VAS [name] LOWER EXTREMITY VENOUS (DVT)  Result Date: 08/12/2023  Lower Venous DVT Study Patient Name:  Christine Newton  Date of Exam:   08/12/2023 Medical Rec #: [id number]            Accession #:    [id number] Date of Birth: 03/06/1933            Patient Gender: F Patient Age:   51 years Exam Location:  [facility] Procedure:      VAS [name] LOWER EXTREMITY VENOUS (DVT) Referring Phys: Jacqlyn Krauss OGBATA --------------------------------------------------------------------------------  Indications: Edema.  Risk Factors: Dementia, chronic CHF, valvular hear disease, status post aortic valve replacement. Limitations: Patient condition/confusion, body habitus and poor ultrasound/tissue interface secondary to subcutaneous edema and arterial plaque shadowing, chronic contractures of the left lower extremity . Comparison Study: No prior study Performing Technologist: Sherren Kerns RVS  Examination Guidelines: A complete evaluation includes B-mode imaging, spectral Doppler, color Doppler, and power Doppler as needed of all accessible portions of each vessel. Bilateral testing is considered an integral part of a complete examination. Limited examinations for reoccurring indications may be performed as noted. The reflux portion of the exam is performed with the patient in reverse Trendelenburg.  +------+---------------+---------+-----------+----------+--------------+ RIGHT CompressibilityPhasicitySpontaneityPropertiesThrombus Aging +------+---------------+---------+-----------+----------+--------------+ FV Mid               Yes      Yes                                  +------+---------------+---------+-----------+----------+--------------+ Extremely limited and technically difficult study secondary to body habitus, edema, and acoustic shadowing from arterial plaque. Non diagnostic study   Extremely limited and technically difficult study secondary to body habitus, edema, and acoustic shadowing from arterial plaque. Non diagnostic study    Summary: RIGHT: - Extremely limited and technically difficult study secondary to body habitus, edema, and acoustic shadowing from arterial plaque. Non diagnostic study  LEFT: - Extremely limited and technically difficult study secondary to body habitus, edema, and acoustic shadowing from arterial plaque. Non diagnostic study  *See table(s) above for measurements and observations.    Preliminary    DG Chest Port 1 View  Result Date: 08/12/2023 CLINICAL DATA:  Shortness of breath. EXAM: PORTABLE CHEST 1 VIEW COMPARISON:  08/11/2023 FINDINGS: Lungs are hypoinflated with stable small left pleural effusion likely with associated left basilar atelectasis unchanged. Subtle linear density right base unchanged likely atelectasis/scarring. Cardiomediastinal silhouette and remainder of the exam is unchanged. IMPRESSION: 1. Hypoinflation with stable small left pleural  effusion likely with associated left basilar atelectasis. 2. Stable right basilar atelectasis/scarring. Electronically Signed   By: Elberta Fortis M.D.   On: 08/12/2023 08:23   CT CHEST WO CONTRAST  Result Date: 08/11/2023 CLINICAL DATA:  Respiratory illness EXAM: CT CHEST WITHOUT CONTRAST TECHNIQUE: Multidetector CT imaging of the chest was performed following the standard protocol without IV contrast. RADIATION DOSE REDUCTION: This exam was performed according to the departmental dose-optimization program which includes automated exposure control, adjustment of the mA and/or kV according to patient size and/or use of iterative reconstruction technique. COMPARISON:  None Available.  FINDINGS: Cardiovascular: No significant vascular findings. Normal heart size. No pericardial effusion. There are atherosclerotic calcifications of the aorta and coronary arteries. Mediastinum/Nodes: Thyroid gland is heterogeneous and enlarged, particularly the left thyroid. Multiple calcifications are present. This causes shift of the trachea to the right proximally 1 cm. Trachea is widely patent. There are no enlarged mediastinal or hilar lymph nodes. Esophagus is unremarkable. Lungs/Pleura: There is atelectasis in the bilateral lower lobes. There is a small amount of patchy airspace disease in the inferior left upper lobe. There is no pneumothorax or pleural effusion. Upper Abdomen: Bilateral renal calculi are present. Musculoskeletal: The bones are osteopenic. T7 compression deformity is age indeterminate (moderate). IMPRESSION: 1. Small amount of patchy airspace disease in the inferior left upper lobe may be infectious/inflammatory. 2. Bilateral lower lobe atelectasis. 3. Incidental right thyroid nodule with heterogeneous and enlarged thyroid measuring 1 cm. Recommend non-emergent thyroid ultrasound if clinically warranted given patient age. Reference: J Am Coll Radiol. 2015 Feb;12(2): 143-50 4. Aortic Atherosclerosis (ICD10-I70.0). Electronically Signed   By: Darliss Cheney M.D.   On: 08/11/2023 17:26   DG Chest 1 View  Result Date: 08/11/2023 CLINICAL DATA:  Altered mental status EXAM: CHEST  1 VIEW COMPARISON:  Chest radiograph dated 01/31/2023 FINDINGS: Low lung volumes. Left basilar patchy opacities. Unchanged blunting of the left costophrenic angle. No pneumothorax. Similar cardiomediastinal silhouette status post valve replacement. No acute osseous abnormality. IMPRESSION: 1. Left basilar patchy opacities, which may represent atelectasis, aspiration, or pneumonia. 2. Unchanged blunting of the left costophrenic angle, which may represent a small pleural effusion. Electronically Signed   By: Agustin Cree  M.D.   On: 08/11/2023 15:14   MR BRAIN WO CONTRAST  Result Date: 08/11/2023 CLINICAL DATA:  Sudden onset of left-sided facial droop and right-sided weakness. EXAM: MRI HEAD WITHOUT CONTRAST TECHNIQUE: Multiplanar, multiecho pulse sequences of the brain and surrounding structures were obtained without intravenous contrast. COMPARISON:  Head CT from earlier today FINDINGS: Brain: No acute infarction, hemorrhage, hydrocephalus, extra-axial collection or mass effect. Generalized atrophy, cortical thinning is advanced. Chronic left superior frontal and parietal cortically based infarcts. Small chronic bilateral cerebellar infarcts. Ischemic gliosis bilaterally in the deep cerebral white matter. There is a lobulated sellar and suprasellar mass indistinguishable from the pituitary gland and involving the left cavernous sinus, up to 2.3 cm on sagittal T1 weighted imaging, most consistent with pituitary adenoma and known from multiple prior studies. The mass appears contracted when compared to 2021. Vascular: Major flow voids are preserved Skull and upper cervical spine: No focal marrow lesion. Sinuses/Orbits: No acute finding Other: Intermittently moderate motion artifact. IMPRESSION: 1. Motion degraded brain MRI without acute finding including infarct. 2. Chronic small vessel disease and remote left frontal and parietal cortex infarcts. Generalized brain atrophy. 3. Known pituitary adenoma with decreased size since 2021. Electronically Signed   By: Tiburcio Pea M.D.   On: 08/11/2023 13:31   CT  HEAD CODE STROKE WO CONTRAST  Result Date: 08/11/2023 CLINICAL DATA:  Code stroke. Neuro deficit, acute, stroke suspected. Left-sided facial droop. Left-sided weakness. EXAM: CT HEAD WITHOUT CONTRAST TECHNIQUE: Contiguous axial images were obtained from the base of the skull through the vertex without intravenous contrast. RADIATION DOSE REDUCTION: This exam was performed according to the departmental dose-optimization  program which includes automated exposure control, adjustment of the mA and/or kV according to patient size and/or use of iterative reconstruction technique. COMPARISON:  CT head without contrast 02/02/2023. MR head without contrast 11/18/2022. FINDINGS: Brain: A remote cortical infarct of the left parietal lobe is stable. Remote lacunar infarcts of the caudate head bilaterally are stable. No acute infarct, hemorrhage, or mass lesion is present. The ventricles are proportionate to the degree of atrophy. No significant extraaxial fluid collection is present. Remote lacunar infarcts are again noted in the cerebellum bilaterally. The brainstem is unremarkable. A 1.6 cm sellar and suprasellar mass lesion is again noted. Vascular: Atherosclerotic calcifications are present in the cavernous internal carotid arteries. No hyperdense vessel is present. Skull: Calvarium is intact. No focal lytic or blastic lesions are present. No significant extracranial soft tissue lesion is present. Sinuses/Orbits: The paranasal sinuses and mastoid air cells are clear. Bilateral lens replacements are noted. Globes and orbits are otherwise unremarkable. ASPECTS [facility] Stroke Program Early CT Score) - Ganglionic level infarction (caudate, lentiform nuclei, internal capsule, insula, M1-M3 cortex): 7/7 - Supraganglionic infarction (M4-M6 cortex): 3/3 Total score (0-10 with 10 being normal): 10/10 IMPRESSION: 1. No acute intracranial abnormality or significant interval change. 2. 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 3. Stable remote lacunar infarcts of the caudate head bilaterally and cerebellum bilaterally. 4. Stable 1.6 cm sellar and suprasellar mass lesion. 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 The above was relayed via text pager to Dr. Selina Cooley on 08/11/2023 at 10:07 . Electronically Signed   By: Marin Roberts M.D.   On: 08/11/2023 10:07      Signature  -   Susa Raring M.D on 08/13/2023 at 9:44 AM   -   To page go to [email or web address]

## 2023-08-13 NOTE — Progress Notes (Signed)
PHARMACY - PHYSICIAN COMMUNICATION CRITICAL VALUE ALERT - BLOOD CULTURE IDENTIFICATION (BCID)  Christine Newton is an 89 y.o. female who presented to [facility] on 08/11/2023 with a chief complaint of confusion.  Assessment:  Bcx 1/2 growing GPC - BCID showing staph species with no resistance.   Name of physician (or Provider) Contacted: Dr Thedore Mins  Current antibiotics: Unasyn and azithromycin  Changes to prescribed antibiotics recommended:  Continue current antibiotics - possibly contaminant.   Results for orders placed or performed during the hospital encounter of 08/11/23  Blood Culture ID Panel (Reflexed) (Collected: 08/11/2023  5:22 PM)  Result Value Ref Range   Enterococcus faecalis NOT DETECTED NOT DETECTED   Enterococcus Faecium NOT DETECTED NOT DETECTED   Listeria monocytogenes NOT DETECTED NOT DETECTED   Staphylococcus species DETECTED (A) NOT DETECTED   Staphylococcus aureus (BCID) NOT DETECTED NOT DETECTED   Staphylococcus epidermidis NOT DETECTED NOT DETECTED   Staphylococcus lugdunensis NOT DETECTED NOT DETECTED   Streptococcus species NOT DETECTED NOT DETECTED   Streptococcus agalactiae NOT DETECTED NOT DETECTED   Streptococcus pneumoniae NOT DETECTED NOT DETECTED   Streptococcus pyogenes NOT DETECTED NOT DETECTED   A.calcoaceticus-baumannii NOT DETECTED NOT DETECTED   Bacteroides fragilis NOT DETECTED NOT DETECTED   Enterobacterales NOT DETECTED NOT DETECTED   Enterobacter cloacae complex NOT DETECTED NOT DETECTED   Escherichia coli NOT DETECTED NOT DETECTED   Klebsiella aerogenes NOT DETECTED NOT DETECTED   Klebsiella oxytoca NOT DETECTED NOT DETECTED   Klebsiella pneumoniae NOT DETECTED NOT DETECTED   Proteus species NOT DETECTED NOT DETECTED   Salmonella species NOT DETECTED NOT DETECTED   Serratia marcescens NOT DETECTED NOT DETECTED   Haemophilus influenzae NOT DETECTED NOT DETECTED   Neisseria meningitidis NOT DETECTED NOT DETECTED   Pseudomonas  aeruginosa NOT DETECTED NOT DETECTED   Stenotrophomonas maltophilia NOT DETECTED NOT DETECTED   Candida albicans NOT DETECTED NOT DETECTED   Candida auris NOT DETECTED NOT DETECTED   Candida glabrata NOT DETECTED NOT DETECTED   Candida krusei NOT DETECTED NOT DETECTED   Candida parapsilosis NOT DETECTED NOT DETECTED   Candida tropicalis NOT DETECTED NOT DETECTED   Cryptococcus neoformans/gattii NOT DETECTED NOT DETECTED    Thank you for allowing pharmacy to participate in this patient's care,  Sherron Monday, PharmD, BCCCP Clinical Pharmacist  Phone: [phone] 08/13/2023 7:49 AM  Please check AMION for all [facility] Pharmacy phone numbers After 10:00 PM, call Main Pharmacy [phone]

## 2023-08-14 ENCOUNTER — Inpatient Hospital Stay (HOSPITAL_COMMUNITY): Payer: Medicare PPO

## 2023-08-14 DIAGNOSIS — J189 Pneumonia, unspecified organism: Secondary | ICD-10-CM | POA: Diagnosis not present

## 2023-08-14 DIAGNOSIS — R569 Unspecified convulsions: Secondary | ICD-10-CM

## 2023-08-14 DIAGNOSIS — R4182 Altered mental status, unspecified: Secondary | ICD-10-CM | POA: Diagnosis not present

## 2023-08-14 LAB — CBC
HCT: 29.1 % — ABNORMAL LOW (ref 36.0–46.0)
Hemoglobin: 8.9 g/dL — ABNORMAL LOW (ref 12.0–15.0)
MCH: 29.6 pg (ref 26.0–34.0)
MCHC: 30.6 g/dL (ref 30.0–36.0)
MCV: 96.7 fL (ref 80.0–100.0)
Platelets: 218 10*3/uL (ref 150–400)
RBC: 3.01 MIL/uL — ABNORMAL LOW (ref 3.87–5.11)
RDW: 17.2 % — ABNORMAL HIGH (ref 11.5–15.5)
WBC: 11.2 10*3/uL — ABNORMAL HIGH (ref 4.0–10.5)
nRBC: 0.7 % — ABNORMAL HIGH (ref 0.0–0.2)

## 2023-08-14 LAB — BLOOD GAS, ARTERIAL
Acid-Base Excess: 3.5 mmol/L — ABNORMAL HIGH (ref 0.0–2.0)
Bicarbonate: 27.8 mmol/L (ref 20.0–28.0)
O2 Saturation: 98.1 %
Patient temperature: 36.5
pCO2 arterial: 39 mmHg (ref 32–48)
pH, Arterial: 7.46 — ABNORMAL HIGH (ref 7.35–7.45)
pO2, Arterial: 84 mmHg (ref 83–108)

## 2023-08-14 LAB — URINALYSIS, W/ REFLEX TO CULTURE (INFECTION SUSPECTED)
Bilirubin Urine: NEGATIVE
Glucose, UA: 150 mg/dL — AB
Hgb urine dipstick: NEGATIVE
Ketones, ur: 5 mg/dL — AB
Leukocytes,Ua: NEGATIVE
Nitrite: NEGATIVE
Protein, ur: 30 mg/dL — AB
Specific Gravity, Urine: 1.039 — ABNORMAL HIGH (ref 1.005–1.030)
pH: 5 (ref 5.0–8.0)

## 2023-08-14 LAB — GLUCOSE, CAPILLARY
Glucose-Capillary: 123 mg/dL — ABNORMAL HIGH (ref 70–99)
Glucose-Capillary: 200 mg/dL — ABNORMAL HIGH (ref 70–99)
Glucose-Capillary: 203 mg/dL — ABNORMAL HIGH (ref 70–99)
Glucose-Capillary: 80 mg/dL (ref 70–99)
Glucose-Capillary: 87 mg/dL (ref 70–99)
Glucose-Capillary: 88 mg/dL (ref 70–99)

## 2023-08-14 LAB — BASIC METABOLIC PANEL
Anion gap: 10 (ref 5–15)
BUN: 16 mg/dL (ref 8–23)
CO2: 23 mmol/L (ref 22–32)
Calcium: 7.4 mg/dL — ABNORMAL LOW (ref 8.9–10.3)
Chloride: 110 mmol/L (ref 98–111)
Creatinine, Ser: 0.5 mg/dL (ref 0.44–1.00)
GFR, Estimated: >60 mL/min (ref >60)
Glucose, Bld: 219 mg/dL — ABNORMAL HIGH (ref 70–99)
Potassium: 3.9 mmol/L (ref 3.5–5.1)
Sodium: 143 mmol/L (ref 135–145)

## 2023-08-14 LAB — CBC WITH DIFFERENTIAL/PLATELET
Abs Immature Granulocytes: 0.25 10*3/uL — ABNORMAL HIGH (ref 0.00–0.07)
Basophils Absolute: 0 10*3/uL (ref 0.0–0.1)
Basophils Relative: 0 %
Eosinophils Absolute: 0 10*3/uL (ref 0.0–0.5)
Eosinophils Relative: 0 %
HCT: 28 % — ABNORMAL LOW (ref 36.0–46.0)
Hemoglobin: 8.7 g/dL — ABNORMAL LOW (ref 12.0–15.0)
Immature Granulocytes: 2 %
Lymphocytes Relative: 10 %
Lymphs Abs: 1.1 10*3/uL (ref 0.7–4.0)
MCH: 30.3 pg (ref 26.0–34.0)
MCHC: 31.1 g/dL (ref 30.0–36.0)
MCV: 97.6 fL (ref 80.0–100.0)
Monocytes Absolute: 0.5 10*3/uL (ref 0.1–1.0)
Monocytes Relative: 5 %
Neutro Abs: 9.6 10*3/uL — ABNORMAL HIGH (ref 1.7–7.7)
Neutrophils Relative %: 83 %
Platelets: 235 10*3/uL (ref 150–400)
RBC: 2.87 MIL/uL — ABNORMAL LOW (ref 3.87–5.11)
RDW: 17.3 % — ABNORMAL HIGH (ref 11.5–15.5)
WBC: 11.5 10*3/uL — ABNORMAL HIGH (ref 4.0–10.5)
nRBC: 1.1 % — ABNORMAL HIGH (ref 0.0–0.2)

## 2023-08-14 LAB — PROCALCITONIN: Procalcitonin: <0.1 ng/mL

## 2023-08-14 LAB — C-REACTIVE PROTEIN: CRP: 1.8 mg/dL — ABNORMAL HIGH (ref <1.0)

## 2023-08-14 LAB — BRAIN NATRIURETIC PEPTIDE: B Natriuretic Peptide: 379.3 pg/mL — ABNORMAL HIGH (ref 0.0–100.0)

## 2023-08-14 LAB — MAGNESIUM: Magnesium: 1.8 mg/dL (ref 1.7–2.4)

## 2023-08-14 LAB — AMMONIA: Ammonia: 48 umol/L — ABNORMAL HIGH (ref 9–35)

## 2023-08-14 MED ORDER — LACTULOSE 10 GM/15ML PO SOLN
20.0000 g | Freq: Three times a day (TID) | ORAL | Status: DC
  Administered 2023-08-14 – 2023-08-16 (×2): 20 g via ORAL
  Filled 2023-08-14 (×8): qty 30

## 2023-08-14 MED ORDER — DEXTROSE 50 % IV SOLN
1.0000 | Freq: Once | INTRAVENOUS | Status: AC
  Administered 2023-08-14: 50 mL via INTRAVENOUS
  Filled 2023-08-14: qty 50

## 2023-08-14 MED ORDER — DEXTROSE 5 % IV SOLN: INTRAVENOUS | Status: AC

## 2023-08-14 MED ORDER — PANTOPRAZOLE SODIUM 40 MG PO TBEC
40.0000 mg | DELAYED_RELEASE_TABLET | Freq: Every day | ORAL | Status: DC
  Administered 2023-08-14 – 2023-08-18 (×5): 40 mg via ORAL
  Filled 2023-08-14 (×5): qty 1

## 2023-08-14 NOTE — Plan of Care (Signed)

## 2023-08-14 NOTE — Progress Notes (Signed)
EEG complete - results pending 

## 2023-08-14 NOTE — Care Management Important Message (Signed)
Important Message  Patient Details  Name: Christine Newton MRN: [id number] Date of Birth: February 19, 1933   Medicare Important Message Given:  Yes     Arvle Grabe [place] 08/14/2023, 4:32 PM

## 2023-08-14 NOTE — Progress Notes (Addendum)
PROGRESS NOTE                                                                                                                                                                                                             Patient Demographics:    Christine Newton, is a 89 y.o. female, DOB - January 20, 1933, QIH:[id number]  Outpatient Primary MD for the patient is [name], Fritzi Mandes, MD    LOS - 3  Admit date - 08/11/2023    Chief complaint.  Confusion   Brief Narrative (HPI from H&P)   89 year old African-American female with past medical history significant for dementia, hypertension, TIA, diabetes mellitus, diastolic congestive heart failure, seizures, syncope, status post aortic valve replacement and LVH. Patient was noted to have decreased responsiveness earlier today, with reported right-sided weakness, confusion and lethargy. Patient is also reported to have dry cough especially while eating at home, increased nasal congestion.  She had MRI brain which was nonacute, CT chest confirmed possible pneumonia, she also was noted to have left-sided upper and lower extremity swelling and she was admitted for further care.   Subjective:   Patient in bed, appears comfortable, denies any headache, no fever, no chest pain or pressure, no shortness of breath , no abdominal pain. No new focal weakness.   Assessment  & Plan :   Metabolic encephalopathy in a patient with underlying history of dementia due to aspiration pneumonia.  Likely due to aspiration pneumonia, MRI brain nonacute, no focal deficits, being seen by speech, on antibiotics with aspiration pneumonia improving, mentation had improved however sudden decline again morning of 08/14/2023.  Repeat CT head, EEG, check ABG, ammonia levels, UA stable, chest x-ray unchanged, avoid sedatives, continue to monitor  Dehydration and hypokalemia.  Since less responsive and oral diet as minimal D5W on  08/14/2023.  Swollen left sided upper and lower extremities.  Venous duplex negative for upper and lower extremity, this appears to be dependent edema, challenge with low-dose Lasix once oral intake is improved and mentation is better.  Incidental thyroid nodule.  Outpatient thyroid ultrasound by PCP as desired.  Hypertension.  Placed on Norvasc.  History of chronic diastolic CHF, previous history of aortic valve replacement, history of underlying left bundle branch block.  Compensated, no acute issues.  EF 60% on echocardiogram 1 year ago, monitor.  Dyslipidemia.  On statin.  Condition -  Guarded  Family Communication  : Daughter bedside in detail on 08/12/2023, 08/13/2023  Code Status : DNR  Consults  : None  PUD Prophylaxis : None   Procedures  :     Upper and lower extremity venous ultrasound.  No DVT.  CT chest.  . Small amount of patchy airspace disease in the inferior left upper lobe may be infectious/inflammatory. 2. Bilateral lower lobe atelectasis. 3. Incidental right thyroid nodule with heterogeneous and enlarged thyroid measuring 1 cm. Recommend non-emergent thyroid ultrasound if clinically warranted given patient age.  MRI brain. : 1. Motion degraded brain MRI without acute finding including infarct. 2. Chronic small vessel disease and remote left frontal and parietal cortex infarcts. Generalized brain atrophy. 3. Known pituitary adenoma with decreased size since 2021.  CT head repeat on 08/14/2023.      Disposition Plan  :    Status is: Inpatient  DVT Prophylaxis  :    enoxaparin (LOVENOX) injection 40 mg Start: 08/12/23 0800  Lab Results  Component Value Date   PLT 218 08/14/2023    Diet :  Diet Order             DIET DYS 3 Room service appropriate? No; Fluid consistency: Thin  Diet effective now                    Inpatient Medications  Scheduled Meds:  amLODipine  10 mg Oral Daily   aspirin EC  81 mg Oral Daily   atorvastatin  40 mg  Oral Daily   divalproex  250 mg Oral Daily   enoxaparin (LOVENOX) injection  40 mg Subcutaneous Q24H   insulin aspart  0-9 Units Subcutaneous Q4H   metoprolol tartrate  12.5 mg Oral BID   pantoprazole  40 mg Oral Daily   Continuous Infusions:  ampicillin-sulbactam (UNASYN) IV 3 g (08/14/23 0800)   azithromycin 500 mg (08/13/23 1537)   dextrose     PRN Meds:.hydrALAZINE, levalbuterol    Objective:   Vitals:   08/13/23 1956 08/14/23 0000 08/14/23 0400 08/14/23 0543  BP: 95/60 (!) 95/54 112/64   Pulse: 71 69 69 70  Resp: 15 17 14 13   Temp: 98.2 F (36.8 C) (!) 97 F (36.1 C)  97.7 F (36.5 C)  TempSrc: Oral Axillary Oral Axillary  SpO2: 100% 97% 100% 100%  Weight:      Height:        Wt Readings from Last 3 Encounters:  08/12/23 60.1 kg  06/16/23 63.5 kg  01/31/23 65.8 kg    No intake or output data in the 24 hours ending 08/14/23 0953    Physical Exam  Much more somnolent morning of 08/14/2023, moves extremities to painful stimuli .AT,PERRAL Supple Neck, No JVD,   Symmetrical Chest wall movement, Good air movement bilaterally, few rales RRR,No Gallops,Rubs or new Murmurs,  +ve B.Sounds, Abd Soft, No tenderness,   Left upper and lower extremities swollen, chronic contractures in the left lower extremity, ankle protective boots on both feet      Data Review:    Recent Labs  Lab 08/11/23 0947 08/11/23 0950 08/11/23 2345 08/12/23 0118 08/13/23 0557 08/14/23 0228 08/14/23 0821  WBC 20.2*  --  22.6* 21.6* 18.4* 11.5* 11.2*  HGB 12.1   < > 11.8* 11.5* 10.3* 8.7* 8.9*  HCT 39.0   < > 36.4 37.1 32.3* 28.0* 29.1*  PLT 237  --  153 231 246 235 218  MCV 97.0  --  93.8 94.6 94.7 97.6 96.7  MCH 30.1  --  30.4 29.3 30.2 30.3 29.6  MCHC 31.0  --  32.4 31.0 31.9 31.1 30.6  RDW 17.6*  --  17.5* 17.6* 17.2* 17.3* 17.2*  LYMPHSABS 3.1  --   --   --  1.1 1.1  --   MONOABS 1.5*  --   --   --  0.9 0.5  --   EOSABS 0.0  --   --   --  0.0 0.0  --   BASOSABS 0.1  --    --   --  0.1 0.0  --    < > = values in this interval not displayed.    Recent Labs  Lab 08/11/23 0947 08/11/23 0950 08/11/23 1729 08/11/23 2038 08/11/23 2046 08/11/23 2345 08/12/23 0118 08/12/23 1453 08/13/23 0557 08/14/23 0821  NA 144 146*  --   --   --   --  143  --  142 143  K 3.4* 3.1*  --   --   --   --  2.9*  --  3.6 3.9  CL 105 106  --   --   --   --  107  --  110 110  CO2 26  --   --   --   --   --  26  --  21* 23  ANIONGAP 13  --   --   --   --   --  10  --  11 10  GLUCOSE 100* 102*  --   --   --   --  92  --  120* 219*  BUN 22 23  --   --   --   --  19  --  14 16  CREATININE 0.41* 0.50  --   --   --  0.42* 0.44  --  0.48 0.50  AST 20  --   --   --   --  17  --   --   --   --   ALT 17  --   --   --   --  18  --   --   --   --   ALKPHOS 53  --   --   --   --  54  --   --   --   --   BILITOT 1.3*  --   --   --   --  0.4  --   --   --   --   ALBUMIN 2.4*  --   --   --   --  2.2*  --   --   --   --   CRP  --   --   --   --   --   --   --  11.4* 6.5* 1.8*  PROCALCITON  --   --   --  <0.10  --   --   --   --   --   --   LATICACIDVEN  --   --  1.8  --  0.8  --   --   --   --   --   INR 1.1  --   --   --   --   --   --   --   --   --   HGBA1C  --   --   --  5.0  --   --   --   --   --   --  BNP  --   --   --   --   --   --   --  1,141.9* 399.1* 379.3*  MG  --   --   --  1.9  --  2.0 2.1  --  1.8 1.8  CALCIUM 8.0*  --   --   --   --   --  8.0*  --  7.9* 7.4*      Recent Labs  Lab 08/11/23 0947 08/11/23 1729 08/11/23 2038 08/11/23 2046 08/11/23 2345 08/12/23 0118 08/12/23 1453 08/13/23 0557 08/14/23 0821  CRP  --   --   --   --   --   --  11.4* 6.5* 1.8*  PROCALCITON  --   --  <0.10  --   --   --   --   --   --   LATICACIDVEN  --  1.8  --  0.8  --   --   --   --   --   INR 1.1  --   --   --   --   --   --   --   --   HGBA1C  --   --  5.0  --   --   --   --   --   --   BNP  --   --   --   --   --   --  1,141.9* 399.1* 379.3*  MG  --   --  1.9  --  2.0 2.1  --   1.8 1.8  CALCIUM 8.0*  --   --   --   --  8.0*  --  7.9* 7.4*     Micro Results Recent Results (from the past 240 hour(s))  Urine Culture     Status: Abnormal   Collection Time: 08/11/23 11:46 AM   Specimen: In/Out Cath Urine  Result Value Ref Range Status   Specimen Description IN/OUT CATH URINE  Final   Special Requests NONE  Final   Culture (A)  Final    40,000 COLONIES/mL AEROCOCCUS SPECIES Standardized susceptibility testing for this organism is not available. Performed at [facility], [address] [place], [place] [address]    Report Status 08/13/2023 FINAL  Final  SARS Coronavirus 2 by RT PCR (hospital order, performed in [facility] hospital lab) *cepheid single result test* Anterior Nasal Swab     Status: None   Collection Time: 08/11/23  1:10 PM   Specimen: Anterior Nasal Swab  Result Value Ref Range Status   SARS Coronavirus 2 by RT PCR NEGATIVE NEGATIVE Final    Comment: Performed at [facility], [address], [place], [place] [address]  Culture, blood (single)     Status: None (Preliminary result)   Collection Time: 08/11/23  5:22 PM   Specimen: BLOOD LEFT HAND  Result Value Ref Range Status   Specimen Description BLOOD LEFT HAND  Final   Special Requests   Final    BOTTLES DRAWN AEROBIC AND ANAEROBIC Blood Culture adequate volume   Culture  Setup Time   Final    GRAM POSITIVE COCCI AEROBIC BOTTLE ONLY Organism ID to follow CRITICAL RESULT CALLED TO, READ BACK BY AND VERIFIED WITH: K HURTH,[email or web address]  08/13/23 MK    Culture   Final    CULTURE REINCUBATED FOR BETTER GROWTH Performed at [facility], [address], [place], [place] [address]    Report Status PENDING  Incomplete  Blood Culture ID Panel (Reflexed)     Status: Abnormal   Collection Time: 08/11/23  5:22 PM  Result Value Ref Range Status   Enterococcus faecalis NOT DETECTED NOT DETECTED Final   Enterococcus Faecium NOT DETECTED NOT DETECTED Final   Listeria monocytogenes  NOT DETECTED NOT DETECTED Final   Staphylococcus species DETECTED (A) NOT DETECTED Final    Comment: CRITICAL RESULT CALLED TO, READ BACK BY AND VERIFIED WITH: K HURTH,[email or web address]@[email or web address]  08/13/23 MK    Staphylococcus aureus (BCID) NOT DETECTED NOT DETECTED Final   Staphylococcus epidermidis NOT DETECTED NOT DETECTED Final   Staphylococcus lugdunensis NOT DETECTED NOT DETECTED Final   Streptococcus species NOT DETECTED NOT DETECTED Final   Streptococcus agalactiae NOT DETECTED NOT DETECTED Final   Streptococcus pneumoniae NOT DETECTED NOT DETECTED Final   Streptococcus pyogenes NOT DETECTED NOT DETECTED Final   A.calcoaceticus-baumannii NOT DETECTED NOT DETECTED Final   Bacteroides fragilis NOT DETECTED NOT DETECTED Final   Enterobacterales NOT DETECTED NOT DETECTED Final   Enterobacter cloacae complex NOT DETECTED NOT DETECTED Final   Escherichia coli NOT DETECTED NOT DETECTED Final   Klebsiella aerogenes NOT DETECTED NOT DETECTED Final   Klebsiella oxytoca NOT DETECTED NOT DETECTED Final   Klebsiella pneumoniae NOT DETECTED NOT DETECTED Final   Proteus species NOT DETECTED NOT DETECTED Final   Salmonella species NOT DETECTED NOT DETECTED Final   Serratia marcescens NOT DETECTED NOT DETECTED Final   Haemophilus influenzae NOT DETECTED NOT DETECTED Final   Neisseria meningitidis NOT DETECTED NOT DETECTED Final   Pseudomonas aeruginosa NOT DETECTED NOT DETECTED Final   Stenotrophomonas maltophilia NOT DETECTED NOT DETECTED Final   Candida albicans NOT DETECTED NOT DETECTED Final   Candida auris NOT DETECTED NOT DETECTED Final   Candida glabrata NOT DETECTED NOT DETECTED Final   Candida krusei NOT DETECTED NOT DETECTED Final   Candida parapsilosis NOT DETECTED NOT DETECTED Final   Candida tropicalis NOT DETECTED NOT DETECTED Final   Cryptococcus neoformans/gattii NOT DETECTED NOT DETECTED Final    Comment: Performed at [facility], [address], [place], [place] [address]  MRSA Next  Gen by PCR, Nasal     Status: None   Collection Time: 08/12/23  5:38 AM   Specimen: Nasal Mucosa; Nasal Swab  Result Value Ref Range Status   MRSA by PCR Next Gen NOT DETECTED NOT DETECTED Final    Comment: (NOTE) The GeneXpert MRSA Assay (FDA approved for NASAL specimens only), is one component of a comprehensive MRSA colonization surveillance program. It is not intended to diagnose MRSA infection nor to guide or monitor treatment for MRSA infections. Test performance is not FDA approved in patients less than 69 years old. Performed at [facility], [address] [place], [place] [address]     Radiology Reports DG Chest [place] 1 View  Result Date: 08/14/2023 CLINICAL DATA:  Shortness of breath. EXAM: PORTABLE CHEST 1 VIEW COMPARISON:  August 12, 2023. FINDINGS: Stable cardiomediastinal silhouette. Status post aortic valve repair. Moderate left pleural effusion is noted with associated left basilar atelectasis. Right lung is unremarkable. Bony thorax is unremarkable. IMPRESSION: Moderate left pleural effusion is noted with associated left basilar atelectasis. Electronically Signed   By: Lupita Raider M.D.   On: 08/14/2023 08:21   VAS [name] UPPER EXTREMITY VENOUS DUPLEX  Result Date: 08/13/2023 UPPER VENOUS STUDY  Patient Name:  Christine Newton  Date of Exam:   08/12/2023 Medical Rec #: [id number]  Accession #:    [id number] Date of Birth: December 15, 1933            Patient Gender: F Patient Age:   67 years Exam Location:  [facility] Procedure:      VAS [name] UPPER EXTREMITY VENOUS DUPLEX Referring Phys: Jacqlyn Krauss OGBATA --------------------------------------------------------------------------------  Indications: Edema Limitations: Body habitus, poor ultrasound/tissue interface, bandages, line and Subcutaneous edema, patient's altered mental status (patient asking me to stop exam multiple times). Comparison Study: No prior study Performing Technologist: Sherren Kerns RVS   Examination Guidelines: A complete evaluation includes B-mode imaging, spectral Doppler, color Doppler, and power Doppler as needed of all accessible portions of each vessel. Bilateral testing is considered an integral part of a complete examination. Limited examinations for reoccurring indications may be performed as noted.  Right Findings: +----------+------------+---------+-----------+----------+---------------------+ RIGHT     CompressiblePhasicitySpontaneousProperties       Summary        +----------+------------+---------+-----------+----------+---------------------+ IJV           Full       Yes       Yes                                    +----------+------------+---------+-----------+----------+---------------------+ Subclavian               Yes       Yes                                    +----------+------------+---------+-----------+----------+---------------------+ Axillary                 Yes       Yes                                    +----------+------------+---------+-----------+----------+---------------------+ Brachial      Full       Yes       Yes                                    +----------+------------+---------+-----------+----------+---------------------+ Cephalic      Full                                                        +----------+------------+---------+-----------+----------+---------------------+ Basilic                  Yes       Yes               patent by color and                                                             Doppler        +----------+------------+---------+-----------+----------+---------------------+  Left Findings: +----------+------------+---------+-----------+----------+--------------+ LEFT      CompressiblePhasicitySpontaneousProperties   Summary     +----------+------------+---------+-----------+----------+--------------+ IJV  Full       Yes       Yes                              +----------+------------+---------+-----------+----------+--------------+ Subclavian    Full       Yes       Yes                             +----------+------------+---------+-----------+----------+--------------+ Brachial      Full       Yes       Yes                             +----------+------------+---------+-----------+----------+--------------+ Radial                                              Not visualized +----------+------------+---------+-----------+----------+--------------+ Ulnar                                               Not visualized +----------+------------+---------+-----------+----------+--------------+ Cephalic                                            Not visualized +----------+------------+---------+-----------+----------+--------------+ Basilic                                             Not visualized +----------+------------+---------+-----------+----------+--------------+  Summary:  Right: No evidence of deep vein thrombosis in the upper extremity. No evidence of superficial vein thrombosis in the upper extremity.  Left: No DVT or SVT noted in the visualized veins of the left upper extremity. Significant subcutaneous edema noted.  *See table(s) above for measurements and observations.  Diagnosing physician: Lemar Livings MD Electronically signed by Lemar Livings MD on 08/13/2023 at 1:49:26 PM.    Final    VAS [name] LOWER EXTREMITY VENOUS (DVT)  Result Date: 08/13/2023  Lower Venous DVT Study Patient Name:  Christine Newton  Date of Exam:   08/12/2023 Medical Rec #: [id number]            Accession #:    [id number] Date of Birth: August 25, 1933            Patient Gender: F Patient Age:   61 years Exam Location:  [facility] Procedure:      VAS [name] LOWER EXTREMITY VENOUS (DVT) Referring Phys: Jacqlyn Krauss OGBATA --------------------------------------------------------------------------------  Indications: Edema.  Risk Factors: Dementia,  chronic CHF, valvular hear disease, status post aortic valve replacement. Limitations: Patient condition/confusion, body habitus and poor ultrasound/tissue interface secondary to subcutaneous edema and arterial plaque shadowing, chronic contractures of the left lower extremity . Comparison Study: No prior study Performing Technologist: Sherren Kerns RVS  Examination Guidelines: A complete evaluation includes B-mode imaging, spectral Doppler, color Doppler, and power Doppler as needed of all accessible portions of each vessel. Bilateral testing is considered an integral part of a complete examination. Limited examinations  for reoccurring indications may be performed as noted. The reflux portion of the exam is performed with the patient in reverse Trendelenburg.  +------+---------------+---------+-----------+----------+--------------+ RIGHT CompressibilityPhasicitySpontaneityPropertiesThrombus Aging +------+---------------+---------+-----------+----------+--------------+ FV Mid               Yes      Yes                                 +------+---------------+---------+-----------+----------+--------------+ Extremely limited and technically difficult study secondary to body habitus, edema, and acoustic shadowing from arterial plaque. Non diagnostic study   Extremely limited and technically difficult study secondary to body habitus, edema, and acoustic shadowing from arterial plaque. Non diagnostic study    Summary: RIGHT: - Extremely limited and technically difficult study secondary to body habitus, edema, and acoustic shadowing from arterial plaque. Non diagnostic study  LEFT: - Extremely limited and technically difficult study secondary to body habitus, edema, and acoustic shadowing from arterial plaque. Non diagnostic study  *See table(s) above for measurements and observations. Electronically signed by Lemar Livings MD on 08/13/2023 at 1:49:18 PM.    Final    DG Chest Port 1 View  Result Date:  08/12/2023 CLINICAL DATA:  Shortness of breath. EXAM: PORTABLE CHEST 1 VIEW COMPARISON:  08/11/2023 FINDINGS: Lungs are hypoinflated with stable small left pleural effusion likely with associated left basilar atelectasis unchanged. Subtle linear density right base unchanged likely atelectasis/scarring. Cardiomediastinal silhouette and remainder of the exam is unchanged. IMPRESSION: 1. Hypoinflation with stable small left pleural effusion likely with associated left basilar atelectasis. 2. Stable right basilar atelectasis/scarring. Electronically Signed   By: Elberta Fortis M.D.   On: 08/12/2023 08:23   CT CHEST WO CONTRAST  Result Date: 08/11/2023 CLINICAL DATA:  Respiratory illness EXAM: CT CHEST WITHOUT CONTRAST TECHNIQUE: Multidetector CT imaging of the chest was performed following the standard protocol without IV contrast. RADIATION DOSE REDUCTION: This exam was performed according to the departmental dose-optimization program which includes automated exposure control, adjustment of the mA and/or kV according to patient size and/or use of iterative reconstruction technique. COMPARISON:  None Available. FINDINGS: Cardiovascular: No significant vascular findings. Normal heart size. No pericardial effusion. There are atherosclerotic calcifications of the aorta and coronary arteries. Mediastinum/Nodes: Thyroid gland is heterogeneous and enlarged, particularly the left thyroid. Multiple calcifications are present. This causes shift of the trachea to the right proximally 1 cm. Trachea is widely patent. There are no enlarged mediastinal or hilar lymph nodes. Esophagus is unremarkable. Lungs/Pleura: There is atelectasis in the bilateral lower lobes. There is a small amount of patchy airspace disease in the inferior left upper lobe. There is no pneumothorax or pleural effusion. Upper Abdomen: Bilateral renal calculi are present. Musculoskeletal: The bones are osteopenic. T7 compression deformity is age indeterminate  (moderate). IMPRESSION: 1. Small amount of patchy airspace disease in the inferior left upper lobe may be infectious/inflammatory. 2. Bilateral lower lobe atelectasis. 3. Incidental right thyroid nodule with heterogeneous and enlarged thyroid measuring 1 cm. Recommend non-emergent thyroid ultrasound if clinically warranted given patient age. Reference: J Am Coll Radiol. 2015 Feb;12(2): 143-50 4. Aortic Atherosclerosis (ICD10-I70.0). Electronically Signed   By: Darliss Cheney M.D.   On: 08/11/2023 17:26   DG Chest 1 View  Result Date: 08/11/2023 CLINICAL DATA:  Altered mental status EXAM: CHEST  1 VIEW COMPARISON:  Chest radiograph dated 01/31/2023 FINDINGS: Low lung volumes. Left basilar patchy opacities. Unchanged blunting of the left costophrenic angle. No pneumothorax. Similar cardiomediastinal  silhouette status post valve replacement. No acute osseous abnormality. IMPRESSION: 1. Left basilar patchy opacities, which may represent atelectasis, aspiration, or pneumonia. 2. Unchanged blunting of the left costophrenic angle, which may represent a small pleural effusion. Electronically Signed   By: Agustin Cree M.D.   On: 08/11/2023 15:14   MR BRAIN WO CONTRAST  Result Date: 08/11/2023 CLINICAL DATA:  Sudden onset of left-sided facial droop and right-sided weakness. EXAM: MRI HEAD WITHOUT CONTRAST TECHNIQUE: Multiplanar, multiecho pulse sequences of the brain and surrounding structures were obtained without intravenous contrast. COMPARISON:  Head CT from earlier today FINDINGS: Brain: No acute infarction, hemorrhage, hydrocephalus, extra-axial collection or mass effect. Generalized atrophy, cortical thinning is advanced. Chronic left superior frontal and parietal cortically based infarcts. Small chronic bilateral cerebellar infarcts. Ischemic gliosis bilaterally in the deep cerebral white matter. There is a lobulated sellar and suprasellar mass indistinguishable from the pituitary gland and involving the left  cavernous sinus, up to 2.3 cm on sagittal T1 weighted imaging, most consistent with pituitary adenoma and known from multiple prior studies. The mass appears contracted when compared to 2021. Vascular: Major flow voids are preserved Skull and upper cervical spine: No focal marrow lesion. Sinuses/Orbits: No acute finding Other: Intermittently moderate motion artifact. IMPRESSION: 1. Motion degraded brain MRI without acute finding including infarct. 2. Chronic small vessel disease and remote left frontal and parietal cortex infarcts. Generalized brain atrophy. 3. Known pituitary adenoma with decreased size since 2021. Electronically Signed   By: Tiburcio Pea M.D.   On: 08/11/2023 13:31   CT HEAD CODE STROKE WO CONTRAST  Result Date: 08/11/2023 CLINICAL DATA:  Code stroke. Neuro deficit, acute, stroke suspected. Left-sided facial droop. Left-sided weakness. EXAM: CT HEAD WITHOUT CONTRAST TECHNIQUE: Contiguous axial images were obtained from the base of the skull through the vertex without intravenous contrast. RADIATION DOSE REDUCTION: This exam was performed according to the departmental dose-optimization program which includes automated exposure control, adjustment of the mA and/or kV according to patient size and/or use of iterative reconstruction technique. COMPARISON:  CT head without contrast 02/02/2023. MR head without contrast 11/18/2022. FINDINGS: Brain: A remote cortical infarct of the left parietal lobe is stable. Remote lacunar infarcts of the caudate head bilaterally are stable. No acute infarct, hemorrhage, or mass lesion is present. The ventricles are proportionate to the degree of atrophy. No significant extraaxial fluid collection is present. Remote lacunar infarcts are again noted in the cerebellum bilaterally. The brainstem is unremarkable. A 1.6 cm sellar and suprasellar mass lesion is again noted. Vascular: Atherosclerotic calcifications are present in the cavernous internal carotid  arteries. No hyperdense vessel is present. Skull: Calvarium is intact. No focal lytic or blastic lesions are present. No significant extracranial soft tissue lesion is present. Sinuses/Orbits: The paranasal sinuses and mastoid air cells are clear. Bilateral lens replacements are noted. Globes and orbits are otherwise unremarkable. ASPECTS [facility] Stroke Program Early CT Score) - Ganglionic level infarction (caudate, lentiform nuclei, internal capsule, insula, M1-M3 cortex): 7/7 - Supraganglionic infarction (M4-M6 cortex): 3/3 Total score (0-10 with 10 being normal): 10/10 IMPRESSION: 1. No acute intracranial abnormality or significant interval change. 2. 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 3. Stable remote lacunar infarcts of the caudate head bilaterally and cerebellum bilaterally. 4. Stable 1.6 cm sellar and suprasellar mass lesion. 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 The above was relayed via text pager to Dr. Selina Cooley on 08/11/2023 at 10:07 . Electronically Signed   By: Marin Roberts M.D.   On:  08/11/2023 10:07      Signature  -   Susa Raring M.D on 08/14/2023 at 9:53 AM   -  To page go to [email or web address]

## 2023-08-14 NOTE — Procedures (Signed)
Patient Name: Christine Newton  MRN: [id number]  Epilepsy Attending: Charlsie Quest  Referring Physician/Provider: Leroy Sea, MD  Date: 08/14/2023 Duration: 23.17 mins  Patient history: 89yo F with ams getting eeg to evaluate for seizure  Level of alertness: Awake  AEDs during EEG study: VPA  Technical aspects: This EEG study was done with scalp electrodes positioned according to the 10-20 International system of electrode placement. Electrical activity was reviewed with band pass filter of 1-70Hz , sensitivity of 7 uV/mm, display speed of 43mm/sec with a 60Hz  notched filter applied as appropriate. EEG data were recorded continuously and digitally stored.  Video monitoring was available and reviewed as appropriate.  Description: The posterior dominant rhythm consists of 7 Hz activity of moderate voltage (25-35 uV) seen predominantly in posterior head regions, symmetric and reactive to eye opening and eye closing. EEG showed continuous generalized 5 to 7 Hz theta slowing. Hyperventilation and photic stimulation were not performed.     ABNORMALITY - Continuous slow, generalized  IMPRESSION: This study is suggestive of moderate diffuse encephalopathy, nonspecific etiology. No seizures or epileptiform discharges were seen throughout the recording.  Berlin Mokry Annabelle Harman

## 2023-08-15 DIAGNOSIS — R4182 Altered mental status, unspecified: Secondary | ICD-10-CM | POA: Diagnosis not present

## 2023-08-15 DIAGNOSIS — J189 Pneumonia, unspecified organism: Secondary | ICD-10-CM | POA: Diagnosis not present

## 2023-08-15 LAB — BASIC METABOLIC PANEL
Anion gap: 9 (ref 5–15)
BUN: 16 mg/dL (ref 8–23)
CO2: 25 mmol/L (ref 22–32)
Calcium: 7.6 mg/dL — ABNORMAL LOW (ref 8.9–10.3)
Chloride: 108 mmol/L (ref 98–111)
Creatinine, Ser: 0.51 mg/dL (ref 0.44–1.00)
GFR, Estimated: >60 mL/min (ref >60)
Glucose, Bld: 135 mg/dL — ABNORMAL HIGH (ref 70–99)
Potassium: 3.3 mmol/L — ABNORMAL LOW (ref 3.5–5.1)
Sodium: 142 mmol/L (ref 135–145)

## 2023-08-15 LAB — CBC WITH DIFFERENTIAL/PLATELET
Abs Immature Granulocytes: 0.32 10*3/uL — ABNORMAL HIGH (ref 0.00–0.07)
Basophils Absolute: 0 10*3/uL (ref 0.0–0.1)
Basophils Relative: 0 %
Eosinophils Absolute: 0 10*3/uL (ref 0.0–0.5)
Eosinophils Relative: 0 %
HCT: 31.4 % — ABNORMAL LOW (ref 36.0–46.0)
Hemoglobin: 9.6 g/dL — ABNORMAL LOW (ref 12.0–15.0)
Immature Granulocytes: 3 %
Lymphocytes Relative: 9 %
Lymphs Abs: 1.1 10*3/uL (ref 0.7–4.0)
MCH: 29 pg (ref 26.0–34.0)
MCHC: 30.6 g/dL (ref 30.0–36.0)
MCV: 94.9 fL (ref 80.0–100.0)
Monocytes Absolute: 0.7 10*3/uL (ref 0.1–1.0)
Monocytes Relative: 6 %
Neutro Abs: 9.4 10*3/uL — ABNORMAL HIGH (ref 1.7–7.7)
Neutrophils Relative %: 82 %
Platelets: 271 10*3/uL (ref 150–400)
RBC: 3.31 MIL/uL — ABNORMAL LOW (ref 3.87–5.11)
RDW: 16.8 % — ABNORMAL HIGH (ref 11.5–15.5)
WBC: 11.6 10*3/uL — ABNORMAL HIGH (ref 4.0–10.5)
nRBC: 0.7 % — ABNORMAL HIGH (ref 0.0–0.2)

## 2023-08-15 LAB — GLUCOSE, CAPILLARY
Glucose-Capillary: 107 mg/dL — ABNORMAL HIGH (ref 70–99)
Glucose-Capillary: 144 mg/dL — ABNORMAL HIGH (ref 70–99)
Glucose-Capillary: 151 mg/dL — ABNORMAL HIGH (ref 70–99)
Glucose-Capillary: 212 mg/dL — ABNORMAL HIGH (ref 70–99)
Glucose-Capillary: 243 mg/dL — ABNORMAL HIGH (ref 70–99)
Glucose-Capillary: 96 mg/dL (ref 70–99)

## 2023-08-15 LAB — BRAIN NATRIURETIC PEPTIDE: B Natriuretic Peptide: 347.8 pg/mL — ABNORMAL HIGH (ref 0.0–100.0)

## 2023-08-15 LAB — AMMONIA: Ammonia: 36 umol/L — ABNORMAL HIGH (ref 9–35)

## 2023-08-15 LAB — TSH: TSH: 0.518 u[IU]/mL (ref 0.350–4.500)

## 2023-08-15 LAB — MAGNESIUM: Magnesium: 1.8 mg/dL (ref 1.7–2.4)

## 2023-08-15 MED ORDER — DEXTROSE-NACL 5-0.45 % IV SOLN: INTRAVENOUS | Status: DC

## 2023-08-15 MED ORDER — POTASSIUM CHLORIDE 10 MEQ/100ML IV SOLN
10.0000 meq | INTRAVENOUS | Status: AC
  Administered 2023-08-15 (×4): 10 meq via INTRAVENOUS
  Filled 2023-08-15 (×4): qty 100

## 2023-08-15 MED ORDER — DEXTROSE-NACL 5-0.45 % IV SOLN
INTRAVENOUS | Status: AC
  Filled 2023-08-15 (×2): qty 1000

## 2023-08-15 NOTE — Plan of Care (Signed)
Problem: Education: Goal: Ability to describe self-care measures that may prevent or decrease complications (Diabetes Survival Skills Education) will improve 08/15/2023 0118 by Bonnita Levan, RN Outcome: Progressing 08/15/2023 0118 by Bonnita Levan, RN Outcome: Progressing Goal: Individualized Educational Video(s) 08/15/2023 0118 by Bonnita Levan, RN Outcome: Progressing 08/15/2023 0118 by Bonnita Levan, RN Outcome: Progressing   Problem: Coping: Goal: Ability to adjust to condition or change in health will improve 08/15/2023 0118 by Bonnita Levan, RN Outcome: Progressing 08/15/2023 0118 by Bonnita Levan, RN Outcome: Progressing   Problem: Fluid Volume: Goal: Ability to maintain a balanced intake and output will improve 08/15/2023 0118 by Bonnita Levan, RN Outcome: Progressing 08/15/2023 0118 by Bonnita Levan, RN Outcome: Progressing   Problem: Health Behavior/Discharge Planning: Goal: Ability to identify and utilize available resources and services will improve 08/15/2023 0118 by Bonnita Levan, RN Outcome: Progressing 08/15/2023 0118 by Bonnita Levan, RN Outcome: Progressing Goal: Ability to manage health-related needs will improve 08/15/2023 0118 by Bonnita Levan, RN Outcome: Progressing 08/15/2023 0118 by Bonnita Levan, RN Outcome: Progressing   Problem: Metabolic: Goal: Ability to maintain appropriate glucose levels will improve 08/15/2023 0118 by Bonnita Levan, RN Outcome: Progressing 08/15/2023 0118 by Bonnita Levan, RN Outcome: Progressing   Problem: Nutritional: Goal: Maintenance of adequate nutrition will improve 08/15/2023 0118 by Bonnita Levan, RN Outcome: Progressing 08/15/2023 0118 by Bonnita Levan, RN Outcome: Progressing Goal: Progress toward achieving an optimal weight will improve 08/15/2023 0118 by Bonnita Levan, RN Outcome: Progressing 08/15/2023 0118 by Bonnita Levan, RN Outcome: Progressing   Problem: Skin Integrity: Goal: Risk for impaired skin  integrity will decrease 08/15/2023 0118 by Bonnita Levan, RN Outcome: Progressing 08/15/2023 0118 by Bonnita Levan, RN Outcome: Progressing   Problem: Tissue Perfusion: Goal: Adequacy of tissue perfusion will improve 08/15/2023 0118 by Bonnita Levan, RN Outcome: Progressing 08/15/2023 0118 by Bonnita Levan, RN Outcome: Progressing   Problem: Education: Goal: Knowledge of General Education information will improve Description: Including pain rating scale, medication(s)/side effects and non-pharmacologic comfort measures 08/15/2023 0118 by Bonnita Levan, RN Outcome: Progressing 08/15/2023 0118 by Bonnita Levan, RN Outcome: Progressing   Problem: Health Behavior/Discharge Planning: Goal: Ability to manage health-related needs will improve 08/15/2023 0118 by Bonnita Levan, RN Outcome: Progressing 08/15/2023 0118 by Bonnita Levan, RN Outcome: Progressing   Problem: Clinical Measurements: Goal: Ability to maintain clinical measurements within normal limits will improve 08/15/2023 0118 by Bonnita Levan, RN Outcome: Progressing 08/15/2023 0118 by Bonnita Levan, RN Outcome: Progressing Goal: Will remain free from infection 08/15/2023 0118 by Bonnita Levan, RN Outcome: Progressing 08/15/2023 0118 by Bonnita Levan, RN Outcome: Progressing Goal: Diagnostic test results will improve 08/15/2023 0118 by Bonnita Levan, RN Outcome: Progressing 08/15/2023 0118 by Bonnita Levan, RN Outcome: Progressing Goal: Respiratory complications will improve 08/15/2023 0118 by Bonnita Levan, RN Outcome: Progressing 08/15/2023 0118 by Bonnita Levan, RN Outcome: Progressing Goal: Cardiovascular complication will be avoided 08/15/2023 0118 by Bonnita Levan, RN Outcome: Progressing 08/15/2023 0118 by Bonnita Levan, RN Outcome: Progressing   Problem: Activity: Goal: Risk for activity intolerance will decrease 08/15/2023 0118 by Bonnita Levan, RN Outcome: Progressing 08/15/2023 0118 by Bonnita Levan, RN Outcome:  Progressing   Problem: Nutrition: Goal: Adequate nutrition will be maintained 08/15/2023 0118 by Bonnita Levan, RN Outcome: Progressing 08/15/2023 0118 by Bonnita Levan, RN Outcome: Progressing  Problem: Coping: Goal: Level of anxiety will decrease 08/15/2023 0118 by Bonnita Levan, RN Outcome: Progressing 08/15/2023 0118 by Bonnita Levan, RN Outcome: Progressing   Problem: Elimination: Goal: Will not experience complications related to bowel motility 08/15/2023 0118 by Bonnita Levan, RN Outcome: Progressing 08/15/2023 0118 by Bonnita Levan, RN Outcome: Progressing Goal: Will not experience complications related to urinary retention 08/15/2023 0118 by Bonnita Levan, RN Outcome: Progressing 08/15/2023 0118 by Bonnita Levan, RN Outcome: Progressing   Problem: Pain Managment: Goal: General experience of comfort will improve 08/15/2023 0118 by Bonnita Levan, RN Outcome: Progressing 08/15/2023 0118 by Bonnita Levan, RN Outcome: Progressing   Problem: Safety: Goal: Ability to remain free from injury will improve 08/15/2023 0118 by Bonnita Levan, RN Outcome: Progressing 08/15/2023 0118 by Bonnita Levan, RN Outcome: Progressing   Problem: Skin Integrity: Goal: Risk for impaired skin integrity will decrease 08/15/2023 0118 by Bonnita Levan, RN Outcome: Progressing 08/15/2023 0118 by Bonnita Levan, RN Outcome: Progressing

## 2023-08-15 NOTE — Progress Notes (Signed)
PROGRESS NOTE                                                                                                                                                                                                             Patient Demographics:    Christine Newton, is a 89 y.o. female, DOB - 1933/11/06, OZH:[id number]  Outpatient Primary MD for the patient is Blane Ohara, MD    LOS - 4  Admit date - 08/11/2023    Chief complaint.  Confusion   Brief Narrative (HPI from H&P)   89 year old African-American female with past medical history significant for dementia, hypertension, TIA, diabetes mellitus, diastolic congestive heart failure, seizures, syncope, status post aortic valve replacement and LVH. Patient was noted to have decreased responsiveness earlier today, with reported right-sided weakness, confusion and lethargy. Patient is also reported to have dry cough especially while eating at home, increased nasal congestion.  She had MRI brain which was nonacute, CT chest confirmed possible pneumonia, she also was noted to have left-sided upper and lower extremity swelling and she was admitted for further care.   Subjective:   Patient in bed, appears comfortable but remains unresponsive since 08/14/2023 morning   Assessment  & Plan :   Metabolic encephalopathy in a patient with underlying history of dementia due to aspiration pneumonia.  Likely due to aspiration pneumonia, MRI brain nonacute, no focal deficits, repeat CT on 08/14/2019 4 repeat nonacute, stable ABG, stable EEG, ammonia minimally elevated but does not explain her mental status, placed on lactulose.  Aspiration pneumonia being treated, being seen by speech, had long discussion with patient's daughter bedside on 08/15/2023, patient has had gradual decline in her quality of life, daughter understanding that she might be approaching end-of-life, she wants to continue gentle medical  treatment for 1 to 2 days if no further improvement then she wants to pursue full comfort measures and home hospice.  Will involve palliative care.  Dehydration and hypokalemia.  D5W and replace potassium  Swollen left sided upper and lower extremities.  Venous duplex negative for upper and lower extremity nonacute, this appears to be dependent edema, she does favor her left side when she sleeps according to daughter, improve positioning and place pillows if needed.  Incidental thyroid nodule.  Outpatient thyroid ultrasound by PCP as desired.  Hypertension.  Placed on Norvasc.  History of chronic diastolic CHF, previous history of aortic valve replacement, history of underlying left bundle branch block.  Compensated, no acute issues.  EF 60% on echocardiogram 1 year ago, monitor.  Dyslipidemia.  On statin.      Condition -  Guarded  Family Communication  : Daughter bedside in detail on 08/12/2023, 08/13/2023  Code Status : DNR  Consults  : None  PUD Prophylaxis : None   Procedures  :     Upper and lower extremity venous ultrasound.  No DVT.  CT chest.  . Small amount of patchy airspace disease in the inferior left upper lobe may be infectious/inflammatory. 2. Bilateral lower lobe atelectasis. 3. Incidental right thyroid nodule with heterogeneous and enlarged thyroid measuring 1 cm. Recommend non-emergent thyroid ultrasound if clinically warranted given patient age.  MRI brain. : 1. Motion degraded brain MRI without acute finding including infarct. 2. Chronic small vessel disease and remote left frontal and parietal cortex infarcts. Generalized brain atrophy. 3. Known pituitary adenoma with decreased size since 2021.  CT head repeat on 08/14/2023.      Disposition Plan  :    Status is: Inpatient  DVT Prophylaxis  :    enoxaparin (LOVENOX) injection 40 mg Start: 08/12/23 0800  Lab Results  Component Value Date   PLT 271 08/15/2023    Diet :  Diet Order              DIET DYS 3 Room service appropriate? No; Fluid consistency: Thin  Diet effective now                    Inpatient Medications  Scheduled Meds:  amLODipine  10 mg Oral Daily   aspirin EC  81 mg Oral Daily   atorvastatin  40 mg Oral Daily   divalproex  250 mg Oral Daily   enoxaparin (LOVENOX) injection  40 mg Subcutaneous Q24H   insulin aspart  0-9 Units Subcutaneous Q4H   lactulose  20 g Oral TID   metoprolol tartrate  12.5 mg Oral BID   pantoprazole  40 mg Oral Daily   Continuous Infusions:  ampicillin-sulbactam (UNASYN) IV 3 g (08/15/23 0950)   azithromycin 500 mg (08/14/23 1508)   PRN Meds:.hydrALAZINE, levalbuterol    Objective:   Vitals:   08/14/23 1600 08/14/23 2000 08/15/23 0000 08/15/23 0900  BP: 114/66 118/61 121/65 (!) 146/93  Pulse: 85 69 63 84  Resp: 17 17 16  (!) 29  Temp:  97.6 F (36.4 C) 97.8 F (36.6 C) (!) 97.1 F (36.2 C)  TempSrc:  Oral Oral Axillary  SpO2: 98% 98% 100%   Weight:      Height:        Wt Readings from Last 3 Encounters:  08/12/23 60.1 kg  06/16/23 63.5 kg  01/31/23 65.8 kg    No intake or output data in the 24 hours ending 08/15/23 1004    Physical Exam  Somnolent and not responsive, moves extremities to painful stimuli Marion.AT,PERRAL Supple Neck, No JVD,   Symmetrical Chest wall movement, Good air movement bilaterally, few rales RRR,No Gallops,Rubs or new Murmurs,  +ve B.Sounds, Abd Soft, No tenderness,   Left upper and lower extremities swollen, chronic contractures in the left lower extremity, ankle protective boots on both feet      Data Review:    Recent Labs  Lab 08/11/23 0947 08/11/23 0950 08/12/23 0118 08/13/23 0557 08/14/23 0228 08/14/23 0821 08/15/23 0027  WBC 20.2*   < > 21.6*  18.4* 11.5* 11.2* 11.6*  HGB 12.1   < > 11.5* 10.3* 8.7* 8.9* 9.6*  HCT 39.0   < > 37.1 32.3* 28.0* 29.1* 31.4*  PLT 237   < > 231 246 235 218 271  MCV 97.0   < > 94.6 94.7 97.6 96.7 94.9  MCH 30.1   < > 29.3 30.2 30.3  29.6 29.0  MCHC 31.0   < > 31.0 31.9 31.1 30.6 30.6  RDW 17.6*   < > 17.6* 17.2* 17.3* 17.2* 16.8*  LYMPHSABS 3.1  --   --  1.1 1.1  --  1.1  MONOABS 1.5*  --   --  0.9 0.5  --  0.7  EOSABS 0.0  --   --  0.0 0.0  --  0.0  BASOSABS 0.1  --   --  0.1 0.0  --  0.0   < > = values in this interval not displayed.    Recent Labs  Lab 08/11/23 0947 08/11/23 0950 08/11/23 1729 08/11/23 2038 08/11/23 2038 08/11/23 2046 08/11/23 2345 08/12/23 0118 08/12/23 1453 08/13/23 0557 08/14/23 0821 08/14/23 1200 08/15/23 0642  NA 144 146*  --   --   --   --   --  143  --  142 143  --  142  K 3.4* 3.1*  --   --   --   --   --  2.9*  --  3.6 3.9  --  3.3*  CL 105 106  --   --   --   --   --  107  --  110 110  --  108  CO2 26  --   --   --   --   --   --  26  --  21* 23  --  25  ANIONGAP 13  --   --   --   --   --   --  10  --  11 10  --  9  GLUCOSE 100* 102*  --   --   --   --   --  92  --  120* 219*  --  135*  BUN 22 23  --   --   --   --   --  19  --  14 16  --  16  CREATININE 0.41* 0.50  --   --   --   --  0.42* 0.44  --  0.48 0.50  --  0.51  AST 20  --   --   --   --   --  17  --   --   --   --   --   --   ALT 17  --   --   --   --   --  18  --   --   --   --   --   --   ALKPHOS 53  --   --   --   --   --  54  --   --   --   --   --   --   BILITOT 1.3*  --   --   --   --   --  0.4  --   --   --   --   --   --   ALBUMIN 2.4*  --   --   --   --   --  2.2*  --   --   --   --   --   --  CRP  --   --   --   --   --   --   --   --  11.4* 6.5* 1.8*  --   --   PROCALCITON  --   --   --  <0.10  --   --   --   --   --   --  <0.10  --   --   LATICACIDVEN  --   --  1.8  --   --  0.8  --   --   --   --   --   --   --   INR 1.1  --   --   --   --   --   --   --   --   --   --   --   --   TSH  --   --   --   --   --   --   --   --   --   --   --   --  0.518  HGBA1C  --   --   --  5.0  --   --   --   --   --   --   --   --   --   AMMONIA  --   --   --   --   --   --   --   --   --   --   --  48* 36*  BNP   --   --   --   --   --   --   --   --  1,141.9* 399.1* 379.3*  --  347.8*  MG  --   --   --  1.9   < >  --  2.0 2.1  --  1.8 1.8  --  1.8  CALCIUM 8.0*  --   --   --   --   --   --  8.0*  --  7.9* 7.4*  --  7.6*   < > = values in this interval not displayed.      Recent Labs  Lab 08/11/23 0947 08/11/23 1729 08/11/23 2038 08/11/23 2038 08/11/23 2046 08/11/23 2345 08/12/23 0118 08/12/23 1453 08/13/23 0557 08/14/23 0821 08/14/23 1200 08/15/23 0642  CRP  --   --   --   --   --   --   --  11.4* 6.5* 1.8*  --   --   PROCALCITON  --   --  <0.10  --   --   --   --   --   --  <0.10  --   --   LATICACIDVEN  --  1.8  --   --  0.8  --   --   --   --   --   --   --   INR 1.1  --   --   --   --   --   --   --   --   --   --   --   TSH  --   --   --   --   --   --   --   --   --   --   --  0.518  HGBA1C  --   --  5.0  --   --   --   --   --   --   --   --   --  AMMONIA  --   --   --   --   --   --   --   --   --   --  48* 36*  BNP  --   --   --   --   --   --   --  1,141.9* 399.1* 379.3*  --  347.8*  MG  --   --  1.9   < >  --  2.0 2.1  --  1.8 1.8  --  1.8  CALCIUM 8.0*  --   --   --   --   --  8.0*  --  7.9* 7.4*  --  7.6*   < > = values in this interval not displayed.     Micro Results Recent Results (from the past 240 hour(s))  Urine Culture     Status: Abnormal   Collection Time: 08/11/23 11:46 AM   Specimen: In/Out Cath Urine  Result Value Ref Range Status   Specimen Description IN/OUT CATH URINE  Final   Special Requests NONE  Final   Culture (A)  Final    40,000 COLONIES/mL AEROCOCCUS SPECIES Standardized susceptibility testing for this organism is not available. Performed at [facility], [address], [place], [place] [address]    Report Status 08/13/2023 FINAL  Final  SARS Coronavirus 2 by RT PCR (hospital order, performed in [facility] hospital lab) *cepheid single result test* Anterior Nasal Swab     Status: None   Collection Time: 08/11/23  1:10 PM    Specimen: Anterior Nasal Swab  Result Value Ref Range Status   SARS Coronavirus 2 by RT PCR NEGATIVE NEGATIVE Final    Comment: Performed at [facility], [address] [place], [place] [address]  Culture, blood (single)     Status: None (Preliminary result)   Collection Time: 08/11/23  5:22 PM   Specimen: BLOOD LEFT HAND  Result Value Ref Range Status   Specimen Description BLOOD LEFT HAND  Final   Special Requests   Final    BOTTLES DRAWN AEROBIC AND ANAEROBIC Blood Culture adequate volume   Culture  Setup Time   Final    GRAM POSITIVE COCCI AEROBIC BOTTLE ONLY Organism ID to follow CRITICAL RESULT CALLED TO, READ BACK BY AND VERIFIED WITH: K HURTH,PHARMD@[id number]  08/13/23 MK    Culture   Final    GRAM POSITIVE COCCI IDENTIFICATION TO FOLLOW Performed at [facility], [address] [place], [place] [address]    Report Status PENDING  Incomplete  Blood Culture ID Panel (Reflexed)     Status: Abnormal   Collection Time: 08/11/23  5:22 PM  Result Value Ref Range Status   Enterococcus faecalis NOT DETECTED NOT DETECTED Final   Enterococcus Faecium NOT DETECTED NOT DETECTED Final   Listeria monocytogenes NOT DETECTED NOT DETECTED Final   Staphylococcus species DETECTED (A) NOT DETECTED Final    Comment: CRITICAL RESULT CALLED TO, READ BACK BY AND VERIFIED WITH: K HURTH,[email or web address]@[id number]  08/13/23 MK    Staphylococcus aureus (BCID) NOT DETECTED NOT DETECTED Final   Staphylococcus epidermidis NOT DETECTED NOT DETECTED Final   Staphylococcus lugdunensis NOT DETECTED NOT DETECTED Final   Streptococcus species NOT DETECTED NOT DETECTED Final   Streptococcus agalactiae NOT DETECTED NOT DETECTED Final   Streptococcus pneumoniae NOT DETECTED NOT DETECTED Final   Streptococcus pyogenes NOT DETECTED NOT DETECTED Final   A.calcoaceticus-baumannii NOT DETECTED NOT DETECTED Final   Bacteroides fragilis NOT DETECTED NOT  DETECTED Final   Enterobacterales NOT DETECTED NOT DETECTED Final    Enterobacter cloacae complex NOT DETECTED NOT DETECTED Final   Escherichia coli NOT DETECTED NOT DETECTED Final   Klebsiella aerogenes NOT DETECTED NOT DETECTED Final   Klebsiella oxytoca NOT DETECTED NOT DETECTED Final   Klebsiella pneumoniae NOT DETECTED NOT DETECTED Final   Proteus species NOT DETECTED NOT DETECTED Final   Salmonella species NOT DETECTED NOT DETECTED Final   Serratia marcescens NOT DETECTED NOT DETECTED Final   Haemophilus influenzae NOT DETECTED NOT DETECTED Final   Neisseria meningitidis NOT DETECTED NOT DETECTED Final   Pseudomonas aeruginosa NOT DETECTED NOT DETECTED Final   Stenotrophomonas maltophilia NOT DETECTED NOT DETECTED Final   Candida albicans NOT DETECTED NOT DETECTED Final   Candida auris NOT DETECTED NOT DETECTED Final   Candida glabrata NOT DETECTED NOT DETECTED Final   Candida krusei NOT DETECTED NOT DETECTED Final   Candida parapsilosis NOT DETECTED NOT DETECTED Final   Candida tropicalis NOT DETECTED NOT DETECTED Final   Cryptococcus neoformans/gattii NOT DETECTED NOT DETECTED Final    Comment: Performed at [facility], [address] [place], [place] [address]  MRSA Next Gen by PCR, Nasal     Status: None   Collection Time: 08/12/23  5:38 AM   Specimen: Nasal Mucosa; Nasal Swab  Result Value Ref Range Status   MRSA by PCR Next Gen NOT DETECTED NOT DETECTED Final    Comment: (NOTE) The GeneXpert MRSA Assay (FDA approved for NASAL specimens only), is one component of a comprehensive MRSA colonization surveillance program. It is not intended to diagnose MRSA infection nor to guide or monitor treatment for MRSA infections. Test performance is not FDA approved in patients less than 71 years old. Performed at [facility], [address] [place], [place] [address]     Radiology Reports EEG adult  Result Date: 08/14/2023 Charlsie Quest, MD     08/14/2023  2:27 PM Patient Name: Emya Phair MRN: [id number] Epilepsy  Attending: Charlsie Quest Referring Physician/Provider: Leroy Sea, MD Date: 08/14/2023 Duration: 23.17 mins Patient history: 89yo F with ams getting eeg to evaluate for seizure Level of alertness: Awake AEDs during EEG study: VPA Technical aspects: This EEG study was done with scalp electrodes positioned according to the 10-20 International system of electrode placement. Electrical activity was reviewed with band pass filter of 1-70Hz , sensitivity of 7 uV/mm, display speed of 42mm/sec with a 60Hz  notched filter applied as appropriate. EEG data were recorded continuously and digitally stored.  Video monitoring was available and reviewed as appropriate. Description: The posterior dominant rhythm consists of 7 Hz activity of moderate voltage (25-35 uV) seen predominantly in posterior head regions, symmetric and reactive to eye opening and eye closing. EEG showed continuous generalized 5 to 7 Hz theta slowing. Hyperventilation and photic stimulation were not performed.   ABNORMALITY - Continuous slow, generalized IMPRESSION: This study is suggestive of moderate diffuse encephalopathy, nonspecific etiology. No seizures or epileptiform discharges were seen throughout the recording. Priyanka Annabelle Harman   CT HEAD WO CONTRAST ( )  Result Date: 08/14/2023 CLINICAL DATA:  Mental status change, unknown cause EXAM: CT HEAD WITHOUT CONTRAST TECHNIQUE: Contiguous axial images were obtained from the base of the skull through the vertex without intravenous contrast. RADIATION DOSE REDUCTION: This exam was performed according to the departmental dose-optimization program which includes automated exposure control, adjustment of the mA and/or kV according to patient size and/or use of iterative reconstruction technique.  COMPARISON:  CT head 08/11/2023. FINDINGS: Brain: Similar remote infarcts in the caudate and left parietal lobe. No evidence of acute large vascular territory infarct, acute hemorrhage, or midline shift.  Patchy white matter hypodensities are nonspecific but compatible with chronic microvascular ischemic disease. Grossly similar sellar/suprasellar mass. Vascular: Calcific atherosclerosis. Skull: No acute fracture. Sinuses/Orbits: Left sphenoid sinus mucosal thickening. No acute orbital findings. Other: No mastoid effusions. IMPRESSION: 1. No evidence of acute intracranial abnormality. 2. Similar remote infarcts and chronic microvascular ischemic change. 3. Grossly similar sellar/suprasellar mass. Electronically Signed   By: Feliberto Harts M.D.   On: 08/14/2023 11:55   DG Chest Port 1 View  Result Date: 08/14/2023 CLINICAL DATA:  Shortness of breath. EXAM: PORTABLE CHEST 1 VIEW COMPARISON:  August 12, 2023. FINDINGS: Stable cardiomediastinal silhouette. Status post aortic valve repair. Moderate left pleural effusion is noted with associated left basilar atelectasis. Right lung is unremarkable. Bony thorax is unremarkable. IMPRESSION: Moderate left pleural effusion is noted with associated left basilar atelectasis. Electronically Signed   By: Lupita Raider M.D.   On: 08/14/2023 08:21   VAS [name] UPPER EXTREMITY VENOUS DUPLEX  Result Date: 08/13/2023 UPPER VENOUS STUDY  Patient Name:  Michela SHIVER Hackert  Date of Exam:   08/12/2023 Medical Rec #: [id number]            Accession #:    [id number] Date of Birth: [birth year]            Patient Gender: F Patient Age:   37 years Exam Location:  [facility] Procedure:      VAS [name] UPPER EXTREMITY VENOUS DUPLEX Referring Phys: Jacqlyn Krauss OGBATA --------------------------------------------------------------------------------  Indications: Edema Limitations: Body habitus, poor ultrasound/tissue interface, bandages, line and Subcutaneous edema, patient's altered mental status (patient asking me to stop exam multiple times). Comparison Study: No prior study Performing Technologist: Sherren Kerns RVS  Examination Guidelines: A complete evaluation includes B-mode  imaging, spectral Doppler, color Doppler, and power Doppler as needed of all accessible portions of each vessel. Bilateral testing is considered an integral part of a complete examination. Limited examinations for reoccurring indications may be performed as noted.  Right Findings: +----------+------------+---------+-----------+----------+---------------------+ RIGHT     CompressiblePhasicitySpontaneousProperties       Summary        +----------+------------+---------+-----------+----------+---------------------+ IJV           Full       Yes       Yes                                    +----------+------------+---------+-----------+----------+---------------------+ Subclavian               Yes       Yes                                    +----------+------------+---------+-----------+----------+---------------------+ Axillary                 Yes       Yes                                    +----------+------------+---------+-----------+----------+---------------------+ Brachial      Full       Yes       Yes                                    +----------+------------+---------+-----------+----------+---------------------+  Cephalic      Full                                                        +----------+------------+---------+-----------+----------+---------------------+ Basilic                  Yes       Yes               patent by color and                                                             Doppler        +----------+------------+---------+-----------+----------+---------------------+  Left Findings: +----------+------------+---------+-----------+----------+--------------+ LEFT      CompressiblePhasicitySpontaneousProperties   Summary     +----------+------------+---------+-----------+----------+--------------+ IJV           Full       Yes       Yes                              +----------+------------+---------+-----------+----------+--------------+ Subclavian    Full       Yes       Yes                             +----------+------------+---------+-----------+----------+--------------+ Brachial      Full       Yes       Yes                             +----------+------------+---------+-----------+----------+--------------+ Radial                                              Not visualized +----------+------------+---------+-----------+----------+--------------+ Ulnar                                               Not visualized +----------+------------+---------+-----------+----------+--------------+ Cephalic                                            Not visualized +----------+------------+---------+-----------+----------+--------------+ Basilic                                             Not visualized +----------+------------+---------+-----------+----------+--------------+  Summary:  Right: No evidence of deep vein thrombosis in the upper extremity. No evidence of superficial vein thrombosis in the upper extremity.  Left: No DVT or SVT noted in the visualized veins of the left upper extremity. Significant subcutaneous edema noted.  *See table(s) above for measurements and observations.  Diagnosing physician: Lemar Livings  MD Electronically signed by Lemar Livings MD on 08/13/2023 at 1:49:26 PM.    Final    VAS [name] LOWER EXTREMITY VENOUS (DVT)  Result Date: 08/13/2023  Lower Venous DVT Study Patient Name:  Twanna SHIVER Eastwood  Date of Exam:   08/12/2023 Medical Rec #: [id number]            Accession #:    [id number] Date of Birth: Jun 22, 1933            Patient Gender: F Patient Age:   27 years Exam Location:  [facility] Procedure:      VAS [name] LOWER EXTREMITY VENOUS (DVT) Referring Phys: Jacqlyn Krauss OGBATA --------------------------------------------------------------------------------  Indications: Edema.  Risk Factors: Dementia, chronic  CHF, valvular hear disease, status post aortic valve replacement. Limitations: Patient condition/confusion, body habitus and poor ultrasound/tissue interface secondary to subcutaneous edema and arterial plaque shadowing, chronic contractures of the left lower extremity . Comparison Study: No prior study Performing Technologist: Sherren Kerns RVS  Examination Guidelines: A complete evaluation includes B-mode imaging, spectral Doppler, color Doppler, and power Doppler as needed of all accessible portions of each vessel. Bilateral testing is considered an integral part of a complete examination. Limited examinations for reoccurring indications may be performed as noted. The reflux portion of the exam is performed with the patient in reverse Trendelenburg.  +------+---------------+---------+-----------+----------+--------------+ RIGHT CompressibilityPhasicitySpontaneityPropertiesThrombus Aging +------+---------------+---------+-----------+----------+--------------+ FV Mid               Yes      Yes                                 +------+---------------+---------+-----------+----------+--------------+ Extremely limited and technically difficult study secondary to body habitus, edema, and acoustic shadowing from arterial plaque. Non diagnostic study   Extremely limited and technically difficult study secondary to body habitus, edema, and acoustic shadowing from arterial plaque. Non diagnostic study    Summary: RIGHT: - Extremely limited and technically difficult study secondary to body habitus, edema, and acoustic shadowing from arterial plaque. Non diagnostic study  LEFT: - Extremely limited and technically difficult study secondary to body habitus, edema, and acoustic shadowing from arterial plaque. Non diagnostic study  *See table(s) above for measurements and observations. Electronically signed by Lemar Livings MD on 08/13/2023 at 1:49:18 PM.    Final    DG Chest Port 1 View  Result Date:  08/12/2023 CLINICAL DATA:  Shortness of breath. EXAM: PORTABLE CHEST 1 VIEW COMPARISON:  08/11/2023 FINDINGS: Lungs are hypoinflated with stable small left pleural effusion likely with associated left basilar atelectasis unchanged. Subtle linear density right base unchanged likely atelectasis/scarring. Cardiomediastinal silhouette and remainder of the exam is unchanged. IMPRESSION: 1. Hypoinflation with stable small left pleural effusion likely with associated left basilar atelectasis. 2. Stable right basilar atelectasis/scarring. Electronically Signed   By: Elberta Fortis M.D.   On: 08/12/2023 08:23   CT CHEST WO CONTRAST  Result Date: 08/11/2023 CLINICAL DATA:  Respiratory illness EXAM: CT CHEST WITHOUT CONTRAST TECHNIQUE: Multidetector CT imaging of the chest was performed following the standard protocol without IV contrast. RADIATION DOSE REDUCTION: This exam was performed according to the departmental dose-optimization program which includes automated exposure control, adjustment of the mA and/or kV according to patient size and/or use of iterative reconstruction technique. COMPARISON:  None Available. FINDINGS: Cardiovascular: No significant vascular findings. Normal heart size. No pericardial effusion. There are atherosclerotic calcifications of the aorta and coronary arteries. Mediastinum/Nodes: Thyroid gland is heterogeneous  and enlarged, particularly the left thyroid. Multiple calcifications are present. This causes shift of the trachea to the right proximally 1 cm. Trachea is widely patent. There are no enlarged mediastinal or hilar lymph nodes. Esophagus is unremarkable. Lungs/Pleura: There is atelectasis in the bilateral lower lobes. There is a small amount of patchy airspace disease in the inferior left upper lobe. There is no pneumothorax or pleural effusion. Upper Abdomen: Bilateral renal calculi are present. Musculoskeletal: The bones are osteopenic. T7 compression deformity is age indeterminate  (moderate). IMPRESSION: 1. Small amount of patchy airspace disease in the inferior left upper lobe may be infectious/inflammatory. 2. Bilateral lower lobe atelectasis. 3. Incidental right thyroid nodule with heterogeneous and enlarged thyroid measuring 1 cm. Recommend non-emergent thyroid ultrasound if clinically warranted given patient age. Reference: J Am Coll Radiol. 2015 Feb;12(2): 143-50 4. Aortic Atherosclerosis (ICD10-I70.0). Electronically Signed   By: Darliss Cheney M.D.   On: 08/11/2023 17:26   DG Chest 1 View  Result Date: 08/11/2023 CLINICAL DATA:  Altered mental status EXAM: CHEST  1 VIEW COMPARISON:  Chest radiograph dated 01/31/2023 FINDINGS: Low lung volumes. Left basilar patchy opacities. Unchanged blunting of the left costophrenic angle. No pneumothorax. Similar cardiomediastinal silhouette status post valve replacement. No acute osseous abnormality. IMPRESSION: 1. Left basilar patchy opacities, which may represent atelectasis, aspiration, or pneumonia. 2. Unchanged blunting of the left costophrenic angle, which may represent a small pleural effusion. Electronically Signed   By: Agustin Cree M.D.   On: 08/11/2023 15:14   MR BRAIN WO CONTRAST  Result Date: 08/11/2023 CLINICAL DATA:  Sudden onset of left-sided facial droop and right-sided weakness. EXAM: MRI HEAD WITHOUT CONTRAST TECHNIQUE: Multiplanar, multiecho pulse sequences of the brain and surrounding structures were obtained without intravenous contrast. COMPARISON:  Head CT from earlier today FINDINGS: Brain: No acute infarction, hemorrhage, hydrocephalus, extra-axial collection or mass effect. Generalized atrophy, cortical thinning is advanced. Chronic left superior frontal and parietal cortically based infarcts. Small chronic bilateral cerebellar infarcts. Ischemic gliosis bilaterally in the deep cerebral white matter. There is a lobulated sellar and suprasellar mass indistinguishable from the pituitary gland and involving the left  cavernous sinus, up to 2.3 cm on sagittal T1 weighted imaging, most consistent with pituitary adenoma and known from multiple prior studies. The mass appears contracted when compared to 2021. Vascular: Major flow voids are preserved Skull and upper cervical spine: No focal marrow lesion. Sinuses/Orbits: No acute finding Other: Intermittently moderate motion artifact. IMPRESSION: 1. Motion degraded brain MRI without acute finding including infarct. 2. Chronic small vessel disease and remote left frontal and parietal cortex infarcts. Generalized brain atrophy. 3. Known pituitary adenoma with decreased size since 2021. Electronically Signed   By: Tiburcio Pea M.D.   On: 08/11/2023 13:31      Signature  -   Susa Raring M.D on 08/15/2023 at 10:04 AM   -  To page go to [email or web address]

## 2023-08-15 NOTE — Progress Notes (Signed)
Physical Therapy Discharge Patient Details Name: Aricka Caughell MRN: [id number] DOB: [birth year] Today's Date: 08/15/2023 Time:  -     Patient discharged from PT services secondary to medical decline - will need to re-order PT to resume therapy services.  Please see latest therapy progress note for current level of functioning and progress toward goals.    Progress and discharge plan discussed with patient and/or caregiver: Patient unable to participate in discharge planning and no caregivers available  Noted pt now unresponsive and no longer appropriate for PT. If condition improves, please consider re-ordering PT.   GP     Jerolyn Center, PT Acute Rehabilitation Services  Office [phone]  Zena Amos 08/15/2023, 3:30 PM

## 2023-08-16 DIAGNOSIS — Z7189 Other specified counseling: Secondary | ICD-10-CM

## 2023-08-16 DIAGNOSIS — F03C0 Unspecified dementia, severe, without behavioral disturbance, psychotic disturbance, mood disturbance, and anxiety: Secondary | ICD-10-CM

## 2023-08-16 DIAGNOSIS — J189 Pneumonia, unspecified organism: Secondary | ICD-10-CM | POA: Diagnosis not present

## 2023-08-16 DIAGNOSIS — Z515 Encounter for palliative care: Secondary | ICD-10-CM

## 2023-08-16 DIAGNOSIS — R4182 Altered mental status, unspecified: Secondary | ICD-10-CM | POA: Diagnosis not present

## 2023-08-16 LAB — CBC WITH DIFFERENTIAL/PLATELET
Abs Immature Granulocytes: 0.2 10*3/uL — ABNORMAL HIGH (ref 0.00–0.07)
Basophils Absolute: 0 10*3/uL (ref 0.0–0.1)
Basophils Relative: 0 %
Eosinophils Absolute: 0.1 10*3/uL (ref 0.0–0.5)
Eosinophils Relative: 1 %
HCT: 30.6 % — ABNORMAL LOW (ref 36.0–46.0)
Hemoglobin: 9.6 g/dL — ABNORMAL LOW (ref 12.0–15.0)
Lymphocytes Relative: 20 %
Lymphs Abs: 2.2 10*3/uL (ref 0.7–4.0)
MCH: 29.5 pg (ref 26.0–34.0)
MCHC: 31.4 g/dL (ref 30.0–36.0)
MCV: 94.2 fL (ref 80.0–100.0)
Monocytes Absolute: 0.7 10*3/uL (ref 0.1–1.0)
Monocytes Relative: 6 %
Myelocytes: 1 %
Neutro Abs: 7.8 10*3/uL — ABNORMAL HIGH (ref 1.7–7.7)
Neutrophils Relative %: 71 %
Platelets: 279 10*3/uL (ref 150–400)
Promyelocytes Relative: 1 %
RBC: 3.25 MIL/uL — ABNORMAL LOW (ref 3.87–5.11)
RDW: 16.8 % — ABNORMAL HIGH (ref 11.5–15.5)
WBC: 11 10*3/uL — ABNORMAL HIGH (ref 4.0–10.5)
nRBC: 0.5 % — ABNORMAL HIGH (ref 0.0–0.2)
nRBC: 1 /100 WBC — ABNORMAL HIGH

## 2023-08-16 LAB — GLUCOSE, CAPILLARY
Glucose-Capillary: 112 mg/dL — ABNORMAL HIGH (ref 70–99)
Glucose-Capillary: 120 mg/dL — ABNORMAL HIGH (ref 70–99)
Glucose-Capillary: 128 mg/dL — ABNORMAL HIGH (ref 70–99)
Glucose-Capillary: 142 mg/dL — ABNORMAL HIGH (ref 70–99)
Glucose-Capillary: 151 mg/dL — ABNORMAL HIGH (ref 70–99)
Glucose-Capillary: 157 mg/dL — ABNORMAL HIGH (ref 70–99)
Glucose-Capillary: 176 mg/dL — ABNORMAL HIGH (ref 70–99)

## 2023-08-16 LAB — BASIC METABOLIC PANEL
Anion gap: 7 (ref 5–15)
BUN: 14 mg/dL (ref 8–23)
CO2: 25 mmol/L (ref 22–32)
Calcium: 7.4 mg/dL — ABNORMAL LOW (ref 8.9–10.3)
Chloride: 110 mmol/L (ref 98–111)
Creatinine, Ser: 0.59 mg/dL (ref 0.44–1.00)
GFR, Estimated: >60 mL/min (ref >60)
Glucose, Bld: 179 mg/dL — ABNORMAL HIGH (ref 70–99)
Potassium: 3.8 mmol/L (ref 3.5–5.1)
Sodium: 142 mmol/L (ref 135–145)

## 2023-08-16 LAB — BRAIN NATRIURETIC PEPTIDE: B Natriuretic Peptide: 301.4 pg/mL — ABNORMAL HIGH (ref 0.0–100.0)

## 2023-08-16 LAB — MAGNESIUM: Magnesium: 1.7 mg/dL (ref 1.7–2.4)

## 2023-08-16 NOTE — Progress Notes (Signed)
PROGRESS NOTE                                                                                                                                                                                                             Patient Demographics:    Christine Newton, is a 89 y.o. female, DOB - October 16, 1933, AOZ:[id number]  Outpatient Primary MD for the patient is Blane Ohara, MD    LOS - 5  Admit date - 08/11/2023    Chief complaint.  Confusion   Brief Narrative (HPI from H&P)    89 year old African-American female with past medical history significant for dementia, hypertension, TIA, diabetes mellitus, diastolic congestive heart failure, seizures, syncope, status post aortic valve replacement and LVH. Patient was noted to have decreased responsiveness earlier today, with reported right-sided weakness, confusion and lethargy. Patient is also reported to have dry cough especially while eating at home, increased nasal congestion.  She had MRI brain which was nonacute, CT chest confirmed possible pneumonia, she also was noted to have left-sided upper and lower extremity swelling and she was admitted for further care.   Subjective:   No significant events overnight as discussed with staff, unable to provide any complaints today   Assessment  & Plan :   Metabolic encephalopathy in a patient with underlying history of dementia due to aspiration pneumonia.  - Likely due to aspiration pneumonia, MRI brain nonacute, no focal deficits, repeat CT on 08/14/2019 4 repeat nonacute, stable ABG, stable EEG, ammonia minimally elevated but does not explain her mental status, placed on lactulose.  Aspiration pneumonia being treated, being seen by speech,previous MD had long discussion with patient's daughter bedside on 08/15/2023, patient has had gradual decline in her quality of life, continue with current medical management, if no improvement then would  consider palliative.-Continue with IV Unasyn for aspiration pneumonia.  Dehydration and hypokalemia.   - D5W and replace potassium  Swollen left sided upper and lower extremities.   - Venous duplex negative for upper and lower extremity nonacute, this appears to be dependent edema, she does favor her left side when she sleeps according to daughter, improve positioning and place pillows if needed.  Incidental thyroid nodule.  Outpatient thyroid ultrasound by PCP as desired.  Hypertension.  Placed on Norvasc.  History of chronic diastolic CHF, previous history of aortic valve replacement, history  of underlying left bundle branch block.  Compensated, no acute issues.  EF 60% on echocardiogram 1 year ago, monitor.  Dyslipidemia.  On statin.      Condition -  Guarded  Family Communication  : none at bedside  Code Status : DNR  Consults  : None  PUD Prophylaxis : None   Procedures  :     Upper and lower extremity venous ultrasound.  No DVT.  CT chest.  . Small amount of patchy airspace disease in the inferior left upper lobe may be infectious/inflammatory. 2. Bilateral lower lobe atelectasis. 3. Incidental right thyroid nodule with heterogeneous and enlarged thyroid measuring 1 cm. Recommend non-emergent thyroid ultrasound if clinically warranted given patient age.  MRI brain. : 1. Motion degraded brain MRI without acute finding including infarct. 2. Chronic small vessel disease and remote left frontal and parietal cortex infarcts. Generalized brain atrophy. 3. Known pituitary adenoma with decreased size since 2021.  CT head repeat on 08/14/2023.      Disposition Plan  :    Status is: Inpatient  DVT Prophylaxis  :    enoxaparin (LOVENOX) injection 40 mg Start: 08/12/23 0800  Lab Results  Component Value Date   PLT 279 08/16/2023    Diet :  Diet Order             DIET DYS 3 Room service appropriate? No; Fluid consistency: Thin  Diet effective now                     Inpatient Medications  Scheduled Meds:  amLODipine  10 mg Oral Daily   aspirin EC  81 mg Oral Daily   atorvastatin  40 mg Oral Daily   divalproex  250 mg Oral Daily   enoxaparin (LOVENOX) injection  40 mg Subcutaneous Q24H   insulin aspart  0-9 Units Subcutaneous Q4H   lactulose  20 g Oral TID   metoprolol tartrate  12.5 mg Oral BID   pantoprazole  40 mg Oral Daily   Continuous Infusions:  ampicillin-sulbactam (UNASYN) IV 3 g (08/16/23 0917)   PRN Meds:.hydrALAZINE, levalbuterol    Objective:   Vitals:   08/16/23 0000 08/16/23 0400 08/16/23 0800 08/16/23 1200  BP: (!) 141/75 112/74    Pulse: 76 66    Resp: 14 15    Temp: 97.6 F (36.4 C) 97.7 F (36.5 C) (!) 97.3 F (36.3 C) (!) 97.5 F (36.4 C)  TempSrc: Oral Oral Oral Oral  SpO2: 100% 100%    Weight:      Height:        Wt Readings from Last 3 Encounters:  08/12/23 60.1 kg  06/16/23 63.5 kg  01/31/23 65.8 kg    No intake or output data in the 24 hours ending 08/16/23 1305    Physical Exam  Somnolent, open eyes to painful stimuli, otherwise does not answer any questions or follow any commands Good air movement bilaterally, scattered Rales Regular rate and rhythm, no rubs or gallops Abdomen soft Left upper and lower extremities swollen, chronic contractures in the left lower extremity, ankle protective boots on both feet      Data Review:    Recent Labs  Lab 08/11/23 0947 08/11/23 0950 08/13/23 0557 08/14/23 0228 08/14/23 0821 08/15/23 0027 08/16/23 0116  WBC 20.2*   < > 18.4* 11.5* 11.2* 11.6* 11.0*  HGB 12.1   < > 10.3* 8.7* 8.9* 9.6* 9.6*  HCT 39.0   < > 32.3* 28.0* 29.1* 31.4* 30.6*  PLT 237   < > 246 235 218 271 279  MCV 97.0   < > 94.7 97.6 96.7 94.9 94.2  MCH 30.1   < > 30.2 30.3 29.6 29.0 29.5  MCHC 31.0   < > 31.9 31.1 30.6 30.6 31.4  RDW 17.6*   < > 17.2* 17.3* 17.2* 16.8* 16.8*  LYMPHSABS 3.1  --  1.1 1.1  --  1.1 2.2  MONOABS 1.5*  --  0.9 0.5  --  0.7 0.7  EOSABS 0.0   --  0.0 0.0  --  0.0 0.1  BASOSABS 0.1  --  0.1 0.0  --  0.0 0.0   < > = values in this interval not displayed.    Recent Labs  Lab 08/11/23 0947 08/11/23 0950 08/11/23 1729 08/11/23 2038 08/11/23 2038 08/11/23 2046 08/11/23 2345 08/12/23 0118 08/12/23 1453 08/13/23 0557 08/14/23 0821 08/14/23 1200 08/15/23 0642 08/16/23 0116  NA 144   < >  --   --   --   --   --  143  --  142 143  --  142 142  K 3.4*   < >  --   --   --   --   --  2.9*  --  3.6 3.9  --  3.3* 3.8  CL 105   < >  --   --   --   --   --  107  --  110 110  --  108 110  CO2 26  --   --   --   --   --   --  26  --  21* 23  --  25 25  ANIONGAP 13  --   --   --   --   --   --  10  --  11 10  --  9 7  GLUCOSE 100*   < >  --   --   --   --   --  92  --  120* 219*  --  135* 179*  BUN 22   < >  --   --   --   --   --  19  --  14 16  --  16 14  CREATININE 0.41*   < >  --   --   --   --  0.42* 0.44  --  0.48 0.50  --  0.51 0.59  AST 20  --   --   --   --   --  17  --   --   --   --   --   --   --   ALT 17  --   --   --   --   --  18  --   --   --   --   --   --   --   ALKPHOS 53  --   --   --   --   --  54  --   --   --   --   --   --   --   BILITOT 1.3*  --   --   --   --   --  0.4  --   --   --   --   --   --   --   ALBUMIN 2.4*  --   --   --   --   --  2.2*  --   --   --   --   --   --   --  CRP  --   --   --   --   --   --   --   --  11.4* 6.5* 1.8*  --   --   --   PROCALCITON  --   --   --  <0.10  --   --   --   --   --   --  <0.10  --   --   --   LATICACIDVEN  --   --  1.8  --   --  0.8  --   --   --   --   --   --   --   --   INR 1.1  --   --   --   --   --   --   --   --   --   --   --   --   --   TSH  --   --   --   --   --   --   --   --   --   --   --   --  0.518  --   HGBA1C  --   --   --  5.0  --   --   --   --   --   --   --   --   --   --   AMMONIA  --   --   --   --   --   --   --   --   --   --   --  48* 36*  --   BNP  --   --   --   --   --   --   --   --  1,141.9* 399.1* 379.3*  --  347.8* 301.4*  MG   --   --   --  1.9   < >  --  2.0 2.1  --  1.8 1.8  --  1.8 1.7  CALCIUM 8.0*  --   --   --   --   --   --  8.0*  --  7.9* 7.4*  --  7.6* 7.4*   < > = values in this interval not displayed.      Recent Labs  Lab 08/11/23 0947 08/11/23 1729 08/11/23 2038 08/11/23 2046 08/11/23 2345 08/12/23 0118 08/12/23 1453 08/13/23 0557 08/14/23 0821 08/14/23 1200 08/15/23 0642 08/16/23 0116  CRP  --   --   --   --   --   --  11.4* 6.5* 1.8*  --   --   --   PROCALCITON  --   --  <0.10  --   --   --   --   --  <0.10  --   --   --   LATICACIDVEN  --  1.8  --  0.8  --   --   --   --   --   --   --   --   INR 1.1  --   --   --   --   --   --   --   --   --   --   --   TSH  --   --   --   --   --   --   --   --   --   --  0.518  --   HGBA1C  --   --  5.0  --   --   --   --   --   --   --   --   --   AMMONIA  --   --   --   --   --   --   --   --   --  48* 36*  --   BNP  --   --   --   --   --   --  1,141.9* 399.1* 379.3*  --  347.8* 301.4*  MG  --   --  1.9  --    < > 2.1  --  1.8 1.8  --  1.8 1.7  CALCIUM 8.0*  --   --   --   --  8.0*  --  7.9* 7.4*  --  7.6* 7.4*   < > = values in this interval not displayed.     Micro Results Recent Results (from the past 240 hour(s))  Urine Culture     Status: Abnormal   Collection Time: 08/11/23 11:46 AM   Specimen: In/Out Cath Urine  Result Value Ref Range Status   Specimen Description IN/OUT CATH URINE  Final   Special Requests NONE  Final   Culture (A)  Final    40,000 COLONIES/mL AEROCOCCUS SPECIES Standardized susceptibility testing for this organism is not available. Performed at [facility], [address], [place], [place] [address]    Report Status 08/13/2023 FINAL  Final  SARS Coronavirus 2 by RT PCR (hospital order, performed in [facility] hospital lab) *cepheid single result test* Anterior Nasal Swab     Status: None   Collection Time: 08/11/23  1:10 PM   Specimen: Anterior Nasal Swab  Result Value Ref Range Status   SARS  Coronavirus 2 by RT PCR NEGATIVE NEGATIVE Final    Comment: Performed at [facility], [address], [place], [place] [address]  Culture, blood (single)     Status: None (Preliminary result)   Collection Time: 08/11/23  5:22 PM   Specimen: BLOOD LEFT HAND  Result Value Ref Range Status   Specimen Description BLOOD LEFT HAND  Final   Special Requests   Final    BOTTLES DRAWN AEROBIC AND ANAEROBIC Blood Culture adequate volume   Culture  Setup Time   Final    GRAM POSITIVE COCCI AEROBIC BOTTLE ONLY Organism ID to follow CRITICAL RESULT CALLED TO, READ BACK BY AND VERIFIED WITH: K HURTH,PHARMD@[id number]  08/13/23 MK    Culture   Final    GRAM POSITIVE COCCI [facility] FOR ID Performed at [facility], [address], [place], [place] [address]    Report Status PENDING  Incomplete  Blood Culture ID Panel (Reflexed)     Status: Abnormal   Collection Time: 08/11/23  5:22 PM  Result Value Ref Range Status   Enterococcus faecalis NOT DETECTED NOT DETECTED Final   Enterococcus Faecium NOT DETECTED NOT DETECTED Final   Listeria monocytogenes NOT DETECTED NOT DETECTED Final   Staphylococcus species DETECTED (A) NOT DETECTED Final    Comment: CRITICAL RESULT CALLED TO, READ BACK BY AND VERIFIED WITH: K HURTH,PHARMD@[id number]  08/13/23 MK    Staphylococcus aureus (BCID) NOT DETECTED NOT DETECTED Final   Staphylococcus epidermidis NOT DETECTED NOT DETECTED Final   Staphylococcus lugdunensis NOT DETECTED NOT DETECTED Final   Streptococcus species NOT DETECTED NOT DETECTED Final   Streptococcus agalactiae NOT DETECTED NOT DETECTED Final   Streptococcus pneumoniae NOT DETECTED NOT  DETECTED Final   Streptococcus pyogenes NOT DETECTED NOT DETECTED Final   A.calcoaceticus-baumannii NOT DETECTED NOT DETECTED Final   Bacteroides fragilis NOT DETECTED NOT DETECTED Final   Enterobacterales NOT DETECTED NOT DETECTED Final   Enterobacter cloacae complex NOT DETECTED NOT DETECTED Final    Escherichia coli NOT DETECTED NOT DETECTED Final   Klebsiella aerogenes NOT DETECTED NOT DETECTED Final   Klebsiella oxytoca NOT DETECTED NOT DETECTED Final   Klebsiella pneumoniae NOT DETECTED NOT DETECTED Final   Proteus species NOT DETECTED NOT DETECTED Final   Salmonella species NOT DETECTED NOT DETECTED Final   Serratia marcescens NOT DETECTED NOT DETECTED Final   Haemophilus influenzae NOT DETECTED NOT DETECTED Final   Neisseria meningitidis NOT DETECTED NOT DETECTED Final   Pseudomonas aeruginosa NOT DETECTED NOT DETECTED Final   Stenotrophomonas maltophilia NOT DETECTED NOT DETECTED Final   Candida albicans NOT DETECTED NOT DETECTED Final   Candida auris NOT DETECTED NOT DETECTED Final   Candida glabrata NOT DETECTED NOT DETECTED Final   Candida krusei NOT DETECTED NOT DETECTED Final   Candida parapsilosis NOT DETECTED NOT DETECTED Final   Candida tropicalis NOT DETECTED NOT DETECTED Final   Cryptococcus neoformans/gattii NOT DETECTED NOT DETECTED Final    Comment: Performed at [facility], [address], [place], [place] [address]  MRSA Next Gen by PCR, Nasal     Status: None   Collection Time: 08/12/23  5:38 AM   Specimen: Nasal Mucosa; Nasal Swab  Result Value Ref Range Status   MRSA by PCR Next Gen NOT DETECTED NOT DETECTED Final    Comment: (NOTE) The GeneXpert MRSA Assay (FDA approved for NASAL specimens only), is one component of a comprehensive MRSA colonization surveillance program. It is not intended to diagnose MRSA infection nor to guide or monitor treatment for MRSA infections. Test performance is not FDA approved in patients less than 63 years old. Performed at [facility], [address], [place], [place] [address]     Radiology Reports EEG adult  Result Date: 08/14/2023 Charlsie Quest, MD     08/14/2023  2:27 PM Patient Name: Christine Newton MRN: [id number] Epilepsy Attending: Charlsie Quest Referring Physician/Provider: Leroy Sea, MD Date: 08/14/2023 Duration: 23.17 mins Patient history: 89yo F with ams getting eeg to evaluate for seizure Level of alertness: Awake AEDs during EEG study: VPA Technical aspects: This EEG study was done with scalp electrodes positioned according to the 10-20 International system of electrode placement. Electrical activity was reviewed with band pass filter of 1-70Hz , sensitivity of 7 uV/mm, display speed of 14mm/sec with a 60Hz  notched filter applied as appropriate. EEG data were recorded continuously and digitally stored.  Video monitoring was available and reviewed as appropriate. Description: The posterior dominant rhythm consists of 7 Hz activity of moderate voltage (25-35 uV) seen predominantly in posterior head regions, symmetric and reactive to eye opening and eye closing. EEG showed continuous generalized 5 to 7 Hz theta slowing. Hyperventilation and photic stimulation were not performed.   ABNORMALITY - Continuous slow, generalized IMPRESSION: This study is suggestive of moderate diffuse encephalopathy, nonspecific etiology. No seizures or epileptiform discharges were seen throughout the recording. Priyanka Annabelle Harman   CT HEAD WO CONTRAST ( )  Result Date: 08/14/2023 CLINICAL DATA:  Mental status change, unknown cause EXAM: CT HEAD WITHOUT CONTRAST TECHNIQUE: Contiguous axial images were obtained from the base of the skull through the vertex without intravenous contrast. RADIATION DOSE REDUCTION: This exam was performed according  to the departmental dose-optimization program which includes automated exposure control, adjustment of the mA and/or kV according to patient size and/or use of iterative reconstruction technique. COMPARISON:  CT head 08/11/2023. FINDINGS: Brain: Similar remote infarcts in the caudate and left parietal lobe. No evidence of acute large vascular territory infarct, acute hemorrhage, or midline shift. Patchy white matter hypodensities are nonspecific but compatible  with chronic microvascular ischemic disease. Grossly similar sellar/suprasellar mass. Vascular: Calcific atherosclerosis. Skull: No acute fracture. Sinuses/Orbits: Left sphenoid sinus mucosal thickening. No acute orbital findings. Other: No mastoid effusions. IMPRESSION: 1. No evidence of acute intracranial abnormality. 2. Similar remote infarcts and chronic microvascular ischemic change. 3. Grossly similar sellar/suprasellar mass. Electronically Signed   By: Feliberto Harts M.D.   On: 08/14/2023 11:55   DG Chest Port 1 View  Result Date: 08/14/2023 CLINICAL DATA:  Shortness of breath. EXAM: PORTABLE CHEST 1 VIEW COMPARISON:  August 12, 2023. FINDINGS: Stable cardiomediastinal silhouette. Status post aortic valve repair. Moderate left pleural effusion is noted with associated left basilar atelectasis. Right lung is unremarkable. Bony thorax is unremarkable. IMPRESSION: Moderate left pleural effusion is noted with associated left basilar atelectasis. Electronically Signed   By: Lupita Raider M.D.   On: 08/14/2023 08:21   VAS [name] UPPER EXTREMITY VENOUS DUPLEX  Result Date: 08/13/2023 UPPER VENOUS STUDY  Patient Name:  Christine Newton  Date of Exam:   08/12/2023 Medical Rec #: [id number]            Accession #:    [id number] Date of Birth: Jan 30, 1933            Patient Gender: F Patient Age:   56 years Exam Location:  [facility] Procedure:      VAS [name] UPPER EXTREMITY VENOUS DUPLEX Referring Phys: Jacqlyn Krauss OGBATA --------------------------------------------------------------------------------  Indications: Edema Limitations: Body habitus, poor ultrasound/tissue interface, bandages, line and Subcutaneous edema, patient's altered mental status (patient asking me to stop exam multiple times). Comparison Study: No prior study Performing Technologist: Sherren Kerns RVS  Examination Guidelines: A complete evaluation includes B-mode imaging, spectral Doppler, color Doppler, and power Doppler as needed of  all accessible portions of each vessel. Bilateral testing is considered an integral part of a complete examination. Limited examinations for reoccurring indications may be performed as noted.  Right Findings: +----------+------------+---------+-----------+----------+---------------------+ RIGHT     CompressiblePhasicitySpontaneousProperties       Summary        +----------+------------+---------+-----------+----------+---------------------+ IJV           Full       Yes       Yes                                    +----------+------------+---------+-----------+----------+---------------------+ Subclavian               Yes       Yes                                    +----------+------------+---------+-----------+----------+---------------------+ Axillary                 Yes       Yes                                    +----------+------------+---------+-----------+----------+---------------------+  Brachial      Full       Yes       Yes                                    +----------+------------+---------+-----------+----------+---------------------+ Cephalic      Full                                                        +----------+------------+---------+-----------+----------+---------------------+ Basilic                  Yes       Yes               patent by color and                                                             Doppler        +----------+------------+---------+-----------+----------+---------------------+  Left Findings: +----------+------------+---------+-----------+----------+--------------+ LEFT      CompressiblePhasicitySpontaneousProperties   Summary     +----------+------------+---------+-----------+----------+--------------+ IJV           Full       Yes       Yes                             +----------+------------+---------+-----------+----------+--------------+ Subclavian    Full       Yes       Yes                              +----------+------------+---------+-----------+----------+--------------+ Brachial      Full       Yes       Yes                             +----------+------------+---------+-----------+----------+--------------+ Radial                                              Not visualized +----------+------------+---------+-----------+----------+--------------+ Ulnar                                               Not visualized +----------+------------+---------+-----------+----------+--------------+ Cephalic                                            Not visualized +----------+------------+---------+-----------+----------+--------------+ Basilic                                             Not visualized +----------+------------+---------+-----------+----------+--------------+  Summary:  Right: No evidence of deep vein thrombosis in the upper extremity. No evidence of superficial vein thrombosis in the upper extremity.  Left: No DVT or SVT noted in the visualized veins of the left upper extremity. Significant subcutaneous edema noted.  *See table(s) above for measurements and observations.  Diagnosing physician: Lemar Livings MD Electronically signed by Lemar Livings MD on 08/13/2023 at 1:49:26 PM.    Final    VAS [name] LOWER EXTREMITY VENOUS (DVT)  Result Date: 08/13/2023  Lower Venous DVT Study Patient Name:  Christine Newton  Date of Exam:   08/12/2023 Medical Rec #: [id number]            Accession #:    [id number] Date of Birth: January 19, 1933            Patient Gender: F Patient Age:   33 years Exam Location:  [facility] Procedure:      VAS [name] LOWER EXTREMITY VENOUS (DVT) Referring Phys: Jacqlyn Krauss OGBATA --------------------------------------------------------------------------------  Indications: Edema.  Risk Factors: Dementia, chronic CHF, valvular hear disease, status post aortic valve replacement. Limitations: Patient condition/confusion, body habitus and poor  ultrasound/tissue interface secondary to subcutaneous edema and arterial plaque shadowing, chronic contractures of the left lower extremity . Comparison Study: No prior study Performing Technologist: Sherren Kerns RVS  Examination Guidelines: A complete evaluation includes B-mode imaging, spectral Doppler, color Doppler, and power Doppler as needed of all accessible portions of each vessel. Bilateral testing is considered an integral part of a complete examination. Limited examinations for reoccurring indications may be performed as noted. The reflux portion of the exam is performed with the patient in reverse Trendelenburg.  +------+---------------+---------+-----------+----------+--------------+ RIGHT CompressibilityPhasicitySpontaneityPropertiesThrombus Aging +------+---------------+---------+-----------+----------+--------------+ FV Mid               Yes      Yes                                 +------+---------------+---------+-----------+----------+--------------+ Extremely limited and technically difficult study secondary to body habitus, edema, and acoustic shadowing from arterial plaque. Non diagnostic study   Extremely limited and technically difficult study secondary to body habitus, edema, and acoustic shadowing from arterial plaque. Non diagnostic study    Summary: RIGHT: - Extremely limited and technically difficult study secondary to body habitus, edema, and acoustic shadowing from arterial plaque. Non diagnostic study  LEFT: - Extremely limited and technically difficult study secondary to body habitus, edema, and acoustic shadowing from arterial plaque. Non diagnostic study  *See table(s) above for measurements and observations. Electronically signed by Lemar Livings MD on 08/13/2023 at 1:49:18 PM.    Final       Signature  -   Huey Bienenstock M.D on 08/16/2023 at 1:05 PM   -  To page go to [email or web address]

## 2023-08-16 NOTE — Plan of Care (Signed)
Patient remains on antibiotics and more alert today.

## 2023-08-16 NOTE — Consult Note (Signed)
Palliative Care Consult Note                                  Date: 08/16/2023   Patient Name: Christine Newton  DOB: 1933-10-07  MRN: [id number]  Age / Sex: 89 y.o., female  PCP: Blane Ohara, MD Referring Physician: Starleen Arms, MD  Reason for Consultation: Establishing goals of care  HPI/Patient Profile: 89 y.o. female  with past medical history of dementia, TIA, chronic diastolic CHF, diabetes type 2, seizures, hypertension, syncope, and status post aortic valve replacement.  Patient presented to the ED on 08/11/2023 with reported right-sided weakness, confusion, and lethargy.  MRI brain was negative for acute issues.  CT chest showed possible pneumonia.  She was also noted to have sided upper and lower extremity swelling; venous duplex negative and this appears to be dependent edema. She was admitted with metabolic encephalopathy likely due to aspiration pneumonia.  Palliative Medicine was consulted for goals of care.   Subjective:   I have reviewed medical records including EPIC notes, labs and imaging, and assessed the patient at bedside. She is resting quietly in bed, but awakens easily to voice. She is able to answer simple yes or no questions.   I met with her daughter Stefani Dama in the 5th floor waiting room to discuss diagnosis, prognosis, and GOC.  I introduced Palliative Medicine as specialized medical care for people living with serious illness. It focuses on providing relief from the symptoms and stress of a serious illness.   Created space and opportunity for daughter to express thoughts and feelings regarding current medical situation. Values and goals of care important were attempted to be elicited.  Social History: Patient is widowed. Stefani Dama is the only living child (there was also a son, but he is deceased). Vikki is patient's primary caregiver and has lived with her for several years.   Functional Status: Patient  was hospitalized in October 2023 with hip fracture secondary to a fall. Daughter states she never fully recovered from this, and has been non-ambulatory since then.  They have 24/7 private duty caregivers during the week to help with care.  Patient has been receiving PT and OT services at home. She is able to stand with assistance, but is not able to pivot.   GOC Discussion: We discussed patient's current illness within in the larger context of her ongoing co-morbidities.  We discussed the natural trajectory of chronic illness.  We reviewed that dementia is a progressive, non-curable disease underlying the patient's current acute medical conditions. We reviewed specific indicators that patient had reached advanced stages, including non-ambulatory status, decreased ability to communicate,  incontinence of bowel/bladder, and dysphagia.  We discussed the likelihood of recurrent aspiration pneumonia in the setting of irreversible dysphagia. Explained that it will ultimately be a terminal condition.We discussed evidence-based outcomes that artificial feeding not been shown to increase survival, improve quality of life, or prevent aspiration in patients with advanced dementia.    We discussed different paths of care in the context of scope of treatment - full scope versus limited interventions (treat the treatable) versus comfort care. Discussed that this path involves de-escalating full scope medical interventions and allowing a natural course to occur, with the goal of comfort and dignity rather than prolonging life.   I discussed with daughter my recommendation to consider the addition of hospice support at home. I explained that hospice is a Medicare benefit  that provide support and symptom management when patient further declines. Daughter is in agreement with the overall philosophy of hospice; however she wants her mother to continue PT/OT services for now as she feels this is still beneficial to her  mother's overall quality of life.  We discussed outpatient palliative services, with the option to transition to hospice at some point in the future. Daughter will consider and let us know prior to discharge.  A discussion was had today regarding advanced directives. Concepts specific to code status, artifical feeding and hydration, continued IV antibiotics and rehospitalization was had.  The MOST form was introduced and discussed.  Questions and concerns addressed. Daughter was encouraged to call with questions or concerns. "Hard choices" book was provided.    Review of Systems  Unable to perform ROS   Objective:   Primary Diagnoses: Present on Admission:  Altered mental status   Physical Exam Vitals reviewed.  Constitutional:      General: She is awake. She is not in acute distress.    Comments: Frail and chronically ill-appearing  Pulmonary:     Effort: Pulmonary effort is normal.  Psychiatric:        Cognition and Memory: Cognition is impaired. Memory is impaired.     Vital Signs:  BP 112/74 (BP Location: Right Arm)   Pulse 66   Temp (!) 97.5 F (36.4 C) (Oral)   Resp 15   Ht 5\' 6"  (1.676 m)   Wt 60.1 kg   SpO2 100%   BMI 21.39 kg/m   Palliative Assessment/Data: PPS 30%     Assessment & Plan:   SUMMARY OF RECOMMENDATIONS   DNR as previously documented Continue current supportive interventions - treat the treatable Plan for discharge home when medically stable Patient is likely eligible for home hospice; however daughter is not ready for this yet as she wishes for patient to continue PT/OT services for now PMT will continue to follow  Primary Decision Maker: Daughter - Khaleesi Cristiano  Prognosis:  Difficult to determine, less than 6 months would not be surprising  Discharge Planning:  Home with Home Health   Discussed with: Dr. Westley Gambles, RN, and [facility]   Thank you for allowing [name] to participate in the care of Christine Newton  Time Total: 75  minutes  Greater than 50%  of this time was spent counseling and coordinating care related to the above assessment and plan.  Signed by: Sherlean Foot, NP Palliative Medicine Team  Team Phone # [phone]  For individual providers, please see [facility]

## 2023-08-16 NOTE — Plan of Care (Signed)

## 2023-08-16 NOTE — Progress Notes (Signed)
Occupational Therapy Discharge Patient Details Name: Christine Newton MRN: [id number] DOB: 12-08-1933 Today's Date: 08/16/2023 Time:  -     Patient discharged from OT services secondary to medical decline - will need to re-order OT to resume therapy services.  Please see latest therapy progress note for current level of functioning and progress toward goals.    Progress and discharge plan discussed with patient and/or caregiver: Patient unable to participate in discharge planning and no caregivers available  Discussed with RN that patient is not appropriate for therapy at this time following medical decline. Reconsult OT when able, will be signing off at this time.  08/16/2023  AB, OTR/L  Acute Rehabilitation Services  Office: [phone]   GO     [name] 08/16/2023, 5:25 PM

## 2023-08-17 DIAGNOSIS — J690 Pneumonitis due to inhalation of food and vomit: Secondary | ICD-10-CM

## 2023-08-17 DIAGNOSIS — R4182 Altered mental status, unspecified: Secondary | ICD-10-CM | POA: Diagnosis not present

## 2023-08-17 LAB — CBC WITH DIFFERENTIAL/PLATELET
Abs Immature Granulocytes: 2.03 K/uL — ABNORMAL HIGH (ref 0.00–0.07)
Basophils Absolute: 0 K/uL (ref 0.0–0.1)
Basophils Relative: 0 %
Eosinophils Absolute: 0.2 K/uL (ref 0.0–0.5)
Eosinophils Relative: 1 %
HCT: 35.4 % — ABNORMAL LOW (ref 36.0–46.0)
Hemoglobin: 10.9 g/dL — ABNORMAL LOW (ref 12.0–15.0)
Immature Granulocytes: 15 %
Lymphocytes Relative: 21 %
Lymphs Abs: 2.9 K/uL (ref 0.7–4.0)
MCH: 30.5 pg (ref 26.0–34.0)
MCHC: 30.8 g/dL (ref 30.0–36.0)
MCV: 99.2 fL (ref 80.0–100.0)
Monocytes Absolute: 1 K/uL (ref 0.1–1.0)
Monocytes Relative: 7 %
Neutro Abs: 7.6 K/uL (ref 1.7–7.7)
Neutrophils Relative %: 56 %
Platelets: 288 K/uL (ref 150–400)
RBC: 3.57 MIL/uL — ABNORMAL LOW (ref 3.87–5.11)
RDW: 16.7 % — ABNORMAL HIGH (ref 11.5–15.5)
WBC: 13.7 K/uL — ABNORMAL HIGH (ref 4.0–10.5)
nRBC: 0.8 % — ABNORMAL HIGH (ref 0.0–0.2)

## 2023-08-17 LAB — GLUCOSE, CAPILLARY
Glucose-Capillary: 95 mg/dL (ref 70–99)
Glucose-Capillary: 81 mg/dL (ref 70–99)
Glucose-Capillary: 83 mg/dL (ref 70–99)
Glucose-Capillary: 176 mg/dL — ABNORMAL HIGH (ref 70–99)
Glucose-Capillary: 173 mg/dL — ABNORMAL HIGH (ref 70–99)
Glucose-Capillary: 139 mg/dL — ABNORMAL HIGH (ref 70–99)

## 2023-08-17 LAB — MAGNESIUM: Magnesium: 1.8 mg/dL (ref 1.7–2.4)

## 2023-08-17 LAB — BASIC METABOLIC PANEL WITH GFR
Anion gap: 8 (ref 5–15)
BUN: 14 mg/dL (ref 8–23)
CO2: 23 mmol/L (ref 22–32)
Calcium: 7.4 mg/dL — ABNORMAL LOW (ref 8.9–10.3)
Chloride: 109 mmol/L (ref 98–111)
Creatinine, Ser: 0.63 mg/dL (ref 0.44–1.00)
GFR, Estimated: >60 mL/min
Glucose, Bld: 108 mg/dL — ABNORMAL HIGH (ref 70–99)
Potassium: 3.9 mmol/L (ref 3.5–5.1)
Sodium: 140 mmol/L (ref 135–145)

## 2023-08-17 LAB — BRAIN NATRIURETIC PEPTIDE: B Natriuretic Peptide: 320.1 pg/mL — ABNORMAL HIGH (ref 0.0–100.0)

## 2023-08-17 MED ORDER — LACTATED RINGERS IV BOLUS: 1000.0000 mL | Freq: Once | INTRAVENOUS | Status: DC

## 2023-08-17 NOTE — TOC CM/SW Note (Addendum)
CM went to speak to patient and to see if Daughter might be here visiting. Patient was asleep and Daughter was not in room. CM will call Daughter.  Want top discuss DC plan to home . Patient is active with [facility] and will resume services .   TOC will continue to follow patient for any additional discharge needs

## 2023-08-17 NOTE — Progress Notes (Deleted)
BP is 93/40. Plan to give fluid bolus and hold metoprolol tonight.

## 2023-08-17 NOTE — TOC Progression Note (Signed)
Transition of Care [facility]) - Progression Note    Patient Details  Name: Christine Newton MRN: [id number] Date of Birth: 07-01-1933  [facility] CM/SW Contact  Gordy Clement, RN Phone Number: 08/17/2023, 4:40 PM  Clinical Narrative:     CM spoke with Daughter regarding DC plan. Patient will DC to home tomorrow (8/23). [facility] will transport .  Patient has all  DME in the home. Hospital Bed, Wheelchair, Nurse, adult and [employer].  Patient has a caregiver during the day Elsie Lincoln [phone]) until Patient's Daughter gets home from work. Patient is current with [facility].   TOC will continue to follow patient for any additional discharge needs               Expected Discharge Plan and Services                                               Social Determinants of Health (SDOH) Interventions SDOH Screenings   Food Insecurity: No Food Insecurity (08/12/2023)  Housing: Low Risk  (08/12/2023)  Transportation Needs: No Transportation Needs (08/12/2023)  Utilities: Not At Risk (08/12/2023)  Depression (PHQ2-9): Low Risk  (06/16/2023)  Tobacco Use: Low Risk  (06/19/2023)    Readmission Risk Interventions     No data to display

## 2023-08-17 NOTE — Progress Notes (Signed)
PROGRESS NOTE                                                                                                                                                                                                             Patient Demographics:    Christine Newton, is a 89 y.o. female, DOB - 02/10/1933, JYN:[id number]  Outpatient Primary MD for the patient is Blane Ohara, MD    LOS - 6  Admit date - 08/11/2023    Chief complaint.  Confusion   Brief Narrative (HPI from H&P)    89 year old African-American female with past medical history significant for dementia, hypertension, TIA, diabetes mellitus, diastolic congestive heart failure, seizures, syncope, status post aortic valve replacement and LVH. Patient was noted to have decreased responsiveness earlier today, with reported right-sided weakness, confusion and lethargy. Patient is also reported to have dry cough especially while eating at home, increased nasal congestion.  She had MRI brain which was nonacute, CT chest confirmed possible pneumonia, she also was noted to have left-sided upper and lower extremity swelling and she was admitted for further care.   Subjective:   No significant events overnight as discussed with staff, she is unable to provide any complaints today   Assessment  & Plan :   Metabolic encephalopathy in a patient with underlying history of dementia due to aspiration pneumonia.  - Likely due to aspiration pneumonia, MRI brain nonacute, no focal deficits, repeat CT on 08/14/2019 4 repeat nonacute, stable ABG, stable EEG, ammonia minimally elevated but does not explain her mental status, placed on lactulose.  Aspiration pneumonia being treated, being seen by speech,previous MD had long discussion with patient's daughter bedside on 08/15/2023, patient has had gradual decline in her quality of life, continue with current medical management, if no improvement then would  consider palliative. -Did with IV Unasyn for aspiration pneumonia -Palliative medicine assistance greatly appreciated, plan to go home with home health upon discharge, likely tomorrow.  Dehydration and hypokalemia.   -Resolved with IV fluids.  Swollen left sided upper and lower extremities.   - Venous duplex negative for upper and lower extremity nonacute, this appears to be dependent edema, she does favor her left side when she sleeps according to daughter, improve positioning and place pillows if needed.  Incidental thyroid nodule.   Outpatient thyroid ultrasound by PCP as desired.  Hypertension.  Placed on Norvasc.  History of chronic diastolic CHF, previous history of aortic valve replacement, history of underlying left bundle branch block.  Compensated, no acute issues.  EF 60% on echocardiogram 1 year ago, monitor.  Dyslipidemia.  On statin.      Condition -  Guarded  Family Communication  : none at bedside  Code Status : DNR  Consults  : None  PUD Prophylaxis : None   Procedures  :     Upper and lower extremity venous ultrasound.  No DVT.  CT chest.  . Small amount of patchy airspace disease in the inferior left upper lobe may be infectious/inflammatory. 2. Bilateral lower lobe atelectasis. 3. Incidental right thyroid nodule with heterogeneous and enlarged thyroid measuring 1 cm. Recommend non-emergent thyroid ultrasound if clinically warranted given patient age.  MRI brain. : 1. Motion degraded brain MRI without acute finding including infarct. 2. Chronic small vessel disease and remote left frontal and parietal cortex infarcts. Generalized brain atrophy. 3. Known pituitary adenoma with decreased size since 2021.  CT head repeat on 08/14/2023.      Disposition Plan  :    Status is: Inpatient  DVT Prophylaxis  :    enoxaparin (LOVENOX) injection 40 mg Start: 08/12/23 0800  Lab Results  Component Value Date   PLT 288 08/17/2023    Diet :  Diet Order              DIET DYS 3 Room service appropriate? No; Fluid consistency: Thin  Diet effective now                    Inpatient Medications  Scheduled Meds:  amLODipine  10 mg Oral Daily   aspirin EC  81 mg Oral Daily   atorvastatin  40 mg Oral Daily   divalproex  250 mg Oral Daily   enoxaparin (LOVENOX) injection  40 mg Subcutaneous Q24H   insulin aspart  0-9 Units Subcutaneous Q4H   lactulose  20 g Oral TID   metoprolol tartrate  12.5 mg Oral BID   pantoprazole  40 mg Oral Daily   Continuous Infusions:   PRN Meds:.hydrALAZINE, levalbuterol    Objective:   Vitals:   08/17/23 0400 08/17/23 0405 08/17/23 0841 08/17/23 1135  BP: 138/70     Pulse: 76 75    Resp: 12 15  20   Temp:  98 F (36.7 C)  98.3 F (36.8 C)  TempSrc:  Axillary Oral Oral  SpO2: 100% 100%    Weight:      Height:        Wt Readings from Last 3 Encounters:  08/12/23 60.1 kg  06/16/23 63.5 kg  01/31/23 65.8 kg     Intake/Output Summary (Last 24 hours) at 08/17/2023 1346 Last data filed at 08/17/2023 0800 Gross per 24 hour  Intake 0 ml  Output --  Net 0 ml      Physical Exam  She is more awake today, answering to her name, but otherwise unable to follow commands Symmetrical Chest wall movement, Good air movement bilaterally, CTAB RRR,No Gallops,Rubs or new Murmurs, No Parasternal Heave +ve B.Sounds, Abd Soft, No tenderness, No rebound - guarding or rigidity. Chronic contracture in upper and lower extremities       Data Review:    Recent Labs  Lab 08/13/23 0557 08/14/23 0228 08/14/23 0821 08/15/23 0027 08/16/23 0116 08/17/23 0254  WBC 18.4* 11.5* 11.2* 11.6* 11.0* 13.7*  HGB 10.3* 8.7* 8.9* 9.6* 9.6* 10.9*  HCT 32.3* 28.0* 29.1* 31.4* 30.6* 35.4*  PLT 246 235 218 271 279 288  MCV 94.7 97.6 96.7 94.9 94.2 99.2  MCH 30.2 30.3 29.6 29.0 29.5 30.5  MCHC 31.9 31.1 30.6 30.6 31.4 30.8  RDW 17.2* 17.3* 17.2* 16.8* 16.8* 16.7*  LYMPHSABS 1.1 1.1  --  1.1 2.2 2.9  MONOABS 0.9 0.5   --  0.7 0.7 1.0  EOSABS 0.0 0.0  --  0.0 0.1 0.2  BASOSABS 0.1 0.0  --  0.0 0.0 0.0    Recent Labs  Lab 08/11/23 0947 08/11/23 0950 08/11/23 1729 08/11/23 2038 08/11/23 2038 08/11/23 2046 08/11/23 2345 08/12/23 0118 08/12/23 1453 08/13/23 0557 08/14/23 0821 08/14/23 1200 08/15/23 0642 08/16/23 0116 08/17/23 0254  NA 144   < >  --   --   --   --   --    < >  --  142 143  --  142 142 140  K 3.4*   < >  --   --   --   --   --    < >  --  3.6 3.9  --  3.3* 3.8 3.9  CL 105   < >  --   --   --   --   --    < >  --  110 110  --  108 110 109  CO2 26  --   --   --   --   --   --    < >  --  21* 23  --  25 25 23   ANIONGAP 13  --   --   --   --   --   --    < >  --  11 10  --  9 7 8   GLUCOSE 100*   < >  --   --   --   --   --    < >  --  120* 219*  --  135* 179* 108*  BUN 22   < >  --   --   --   --   --    < >  --  14 16  --  16 14 14   CREATININE 0.41*   < >  --   --   --   --  0.42*   < >  --  0.48 0.50  --  0.51 0.59 0.63  AST 20  --   --   --   --   --  17  --   --   --   --   --   --   --   --   ALT 17  --   --   --   --   --  18  --   --   --   --   --   --   --   --   ALKPHOS 53  --   --   --   --   --  54  --   --   --   --   --   --   --   --   BILITOT 1.3*  --   --   --   --   --  0.4  --   --   --   --   --   --   --   --   ALBUMIN 2.4*  --   --   --   --   --  2.2*  --   --   --   --   --   --   --   --   CRP  --   --   --   --   --   --   --   --  11.4* 6.5* 1.8*  --   --   --   --   PROCALCITON  --   --   --  <0.10  --   --   --   --   --   --  <0.10  --   --   --   --   LATICACIDVEN  --   --  1.8  --   --  0.8  --   --   --   --   --   --   --   --   --   INR 1.1  --   --   --   --   --   --   --   --   --   --   --   --   --   --   TSH  --   --   --   --   --   --   --   --   --   --   --   --  0.518  --   --   HGBA1C  --   --   --  5.0  --   --   --   --   --   --   --   --   --   --   --   AMMONIA  --   --   --   --   --   --   --   --   --   --   --  48* 36*  --   --    BNP  --   --   --   --   --   --   --    < > 1,141.9* 399.1* 379.3*  --  347.8* 301.4* 320.1*  MG  --   --   --  1.9   < >  --  2.0   < >  --  1.8 1.8  --  1.8 1.7 1.8  CALCIUM 8.0*  --   --   --   --   --   --    < >  --  7.9* 7.4*  --  7.6* 7.4* 7.4*   < > = values in this interval not displayed.      Recent Labs  Lab 08/11/23 0947 08/11/23 1729 08/11/23 2038 08/11/23 2046 08/11/23 2345 08/12/23 1453 08/13/23 0557 08/14/23 0821 08/14/23 1200 08/15/23 0642 08/16/23 0116 08/17/23 0254  CRP  --   --   --   --   --  11.4* 6.5* 1.8*  --   --   --   --   PROCALCITON  --   --  <0.10  --   --   --   --  <0.10  --   --   --   --   LATICACIDVEN  --  1.8  --  0.8  --   --   --   --   --   --   --   --   INR 1.1  --   --   --   --   --   --   --   --   --   --   --  TSH  --   --   --   --   --   --   --   --   --  0.518  --   --   HGBA1C  --   --  5.0  --   --   --   --   --   --   --   --   --   AMMONIA  --   --   --   --   --   --   --   --  48* 36*  --   --   BNP  --   --   --   --    < > 1,141.9* 399.1* 379.3*  --  347.8* 301.4* 320.1*  MG  --   --  1.9  --    < >  --  1.8 1.8  --  1.8 1.7 1.8  CALCIUM 8.0*  --   --   --    < >  --  7.9* 7.4*  --  7.6* 7.4* 7.4*   < > = values in this interval not displayed.     Micro Results Recent Results (from the past 240 hour(s))  Urine Culture     Status: Abnormal   Collection Time: 08/11/23 11:46 AM   Specimen: In/Out Cath Urine  Result Value Ref Range Status   Specimen Description IN/OUT CATH URINE  Final   Special Requests NONE  Final   Culture (A)  Final    40,000 COLONIES/mL AEROCOCCUS SPECIES Standardized susceptibility testing for this organism is not available. Performed at [facility], [address] [place], [place] [address]    Report Status 08/13/2023 FINAL  Final  SARS Coronavirus 2 by RT PCR (hospital order, performed in [facility] hospital lab) *cepheid single result test* Anterior Nasal Swab     Status: None    Collection Time: 08/11/23  1:10 PM   Specimen: Anterior Nasal Swab  Result Value Ref Range Status   SARS Coronavirus 2 by RT PCR NEGATIVE NEGATIVE Final    Comment: Performed at [facility], [address], [place], [place] [address]  Culture, blood (single)     Status: None (Preliminary result)   Collection Time: 08/11/23  5:22 PM   Specimen: BLOOD LEFT HAND  Result Value Ref Range Status   Specimen Description BLOOD LEFT HAND  Final   Special Requests   Final    BOTTLES DRAWN AEROBIC AND ANAEROBIC Blood Culture adequate volume   Culture  Setup Time   Final    GRAM POSITIVE COCCI AEROBIC BOTTLE ONLY Organism ID to follow CRITICAL RESULT CALLED TO, READ BACK BY AND VERIFIED WITH: K HURTH,[id number]  08/13/23 MK    Culture   Final    GRAM POSITIVE COCCI [facility] FOR ID Performed at [facility], [address], [place], [place] [address]    Report Status PENDING  Incomplete  Blood Culture ID Panel (Reflexed)     Status: Abnormal   Collection Time: 08/11/23  5:22 PM  Result Value Ref Range Status   Enterococcus faecalis NOT DETECTED NOT DETECTED Final   Enterococcus Faecium NOT DETECTED NOT DETECTED Final   Listeria monocytogenes NOT DETECTED NOT DETECTED Final   Staphylococcus species DETECTED (A) NOT DETECTED Final    Comment: CRITICAL RESULT CALLED TO, READ BACK BY AND VERIFIED WITH: K HURTH,[email or web address]@[id number]  08/13/23 MK    Staphylococcus aureus (BCID) NOT DETECTED NOT DETECTED Final  Staphylococcus epidermidis NOT DETECTED NOT DETECTED Final   Staphylococcus lugdunensis NOT DETECTED NOT DETECTED Final   Streptococcus species NOT DETECTED NOT DETECTED Final   Streptococcus agalactiae NOT DETECTED NOT DETECTED Final   Streptococcus pneumoniae NOT DETECTED NOT DETECTED Final   Streptococcus pyogenes NOT DETECTED NOT DETECTED Final   A.calcoaceticus-baumannii NOT DETECTED NOT DETECTED Final   Bacteroides fragilis NOT DETECTED NOT DETECTED Final    Enterobacterales NOT DETECTED NOT DETECTED Final   Enterobacter cloacae complex NOT DETECTED NOT DETECTED Final   Escherichia coli NOT DETECTED NOT DETECTED Final   Klebsiella aerogenes NOT DETECTED NOT DETECTED Final   Klebsiella oxytoca NOT DETECTED NOT DETECTED Final   Klebsiella pneumoniae NOT DETECTED NOT DETECTED Final   Proteus species NOT DETECTED NOT DETECTED Final   Salmonella species NOT DETECTED NOT DETECTED Final   Serratia marcescens NOT DETECTED NOT DETECTED Final   Haemophilus influenzae NOT DETECTED NOT DETECTED Final   Neisseria meningitidis NOT DETECTED NOT DETECTED Final   Pseudomonas aeruginosa NOT DETECTED NOT DETECTED Final   Stenotrophomonas maltophilia NOT DETECTED NOT DETECTED Final   Candida albicans NOT DETECTED NOT DETECTED Final   Candida auris NOT DETECTED NOT DETECTED Final   Candida glabrata NOT DETECTED NOT DETECTED Final   Candida krusei NOT DETECTED NOT DETECTED Final   Candida parapsilosis NOT DETECTED NOT DETECTED Final   Candida tropicalis NOT DETECTED NOT DETECTED Final   Cryptococcus neoformans/gattii NOT DETECTED NOT DETECTED Final    Comment: Performed at [facility], [address] [place], [place] [address]  MRSA Next Gen by PCR, Nasal     Status: None   Collection Time: 08/12/23  5:38 AM   Specimen: Nasal Mucosa; Nasal Swab  Result Value Ref Range Status   MRSA by PCR Next Gen NOT DETECTED NOT DETECTED Final    Comment: (NOTE) The GeneXpert MRSA Assay (FDA approved for NASAL specimens only), is one component of a comprehensive MRSA colonization surveillance program. It is not intended to diagnose MRSA infection nor to guide or monitor treatment for MRSA infections. Test performance is not FDA approved in patients less than 58 years old. Performed at [facility], [address], [place], [place] [address]     Radiology Reports EEG adult  Result Date: 08/14/2023 Charlsie Quest, MD     08/14/2023  2:27 PM Patient  Name: Christine Newton MRN: [id number] Epilepsy Attending: Charlsie Quest Referring Physician/Provider: Leroy Sea, MD Date: 08/14/2023 Duration: 23.17 mins Patient history: 88yo F with ams getting eeg to evaluate for seizure Level of alertness: Awake AEDs during EEG study: VPA Technical aspects: This EEG study was done with scalp electrodes positioned according to the 10-20 International system of electrode placement. Electrical activity was reviewed with band pass filter of 1-70Hz , sensitivity of 7 uV/mm, display speed of 25mm/sec with a 60Hz  notched filter applied as appropriate. EEG data were recorded continuously and digitally stored.  Video monitoring was available and reviewed as appropriate. Description: The posterior dominant rhythm consists of 7 Hz activity of moderate voltage (25-35 uV) seen predominantly in posterior head regions, symmetric and reactive to eye opening and eye closing. EEG showed continuous generalized 5 to 7 Hz theta slowing. Hyperventilation and photic stimulation were not performed.   ABNORMALITY - Continuous slow, generalized IMPRESSION: This study is suggestive of moderate diffuse encephalopathy, nonspecific etiology. No seizures or epileptiform discharges were seen throughout the recording. Priyanka Annabelle Harman   CT HEAD WO CONTRAST ( )  Result  Date: 08/14/2023 CLINICAL DATA:  Mental status change, unknown cause EXAM: CT HEAD WITHOUT CONTRAST TECHNIQUE: Contiguous axial images were obtained from the base of the skull through the vertex without intravenous contrast. RADIATION DOSE REDUCTION: This exam was performed according to the departmental dose-optimization program which includes automated exposure control, adjustment of the mA and/or kV according to patient size and/or use of iterative reconstruction technique. COMPARISON:  CT head 08/11/2023. FINDINGS: Brain: Similar remote infarcts in the caudate and left parietal lobe. No evidence of acute large vascular  territory infarct, acute hemorrhage, or midline shift. Patchy white matter hypodensities are nonspecific but compatible with chronic microvascular ischemic disease. Grossly similar sellar/suprasellar mass. Vascular: Calcific atherosclerosis. Skull: No acute fracture. Sinuses/Orbits: Left sphenoid sinus mucosal thickening. No acute orbital findings. Other: No mastoid effusions. IMPRESSION: 1. No evidence of acute intracranial abnormality. 2. Similar remote infarcts and chronic microvascular ischemic change. 3. Grossly similar sellar/suprasellar mass. Electronically Signed   By: Feliberto Harts M.D.   On: 08/14/2023 11:55   DG Chest Port 1 View  Result Date: 08/14/2023 CLINICAL DATA:  Shortness of breath. EXAM: PORTABLE CHEST 1 VIEW COMPARISON:  August 12, 2023. FINDINGS: Stable cardiomediastinal silhouette. Status post aortic valve repair. Moderate left pleural effusion is noted with associated left basilar atelectasis. Right lung is unremarkable. Bony thorax is unremarkable. IMPRESSION: Moderate left pleural effusion is noted with associated left basilar atelectasis. Electronically Signed   By: Lupita Raider M.D.   On: 08/14/2023 08:21      Signature  -   Huey Bienenstock M.D on 08/17/2023 at 1:46 PM   -  To page go to [email or web address]

## 2023-08-17 NOTE — Plan of Care (Signed)
  Problem: Education: Goal: Ability to describe self-care measures that may prevent or decrease complications (Diabetes Survival Skills Education) will improve Outcome: Progressing Goal: Individualized Educational Video(s) Outcome: Progressing   Problem: Coping: Goal: Ability to adjust to condition or change in health will improve Outcome: Progressing   Problem: Fluid Volume: Goal: Ability to maintain a balanced intake and output will improve Outcome: Progressing   Problem: Metabolic: Goal: Ability to maintain appropriate glucose levels will improve Outcome: Progressing   Problem: Nutritional: Goal: Maintenance of adequate nutrition will improve Outcome: Progressing Goal: Progress toward achieving an optimal weight will improve Outcome: Progressing   Problem: Skin Integrity: Goal: Risk for impaired skin integrity will decrease Outcome: Progressing   Problem: Tissue Perfusion: Goal: Adequacy of tissue perfusion will improve Outcome: Progressing   Problem: Education: Goal: Knowledge of General Education information will improve Description: Including pain rating scale, medication(s)/side effects and non-pharmacologic comfort measures Outcome: Progressing   Problem: Health Behavior/Discharge Planning: Goal: Ability to manage health-related needs will improve Outcome: Progressing   Problem: Clinical Measurements: Goal: Ability to maintain clinical measurements within normal limits will improve Outcome: Progressing Goal: Will remain free from infection Outcome: Progressing   Problem: Education: Goal: Ability to describe self-care measures that may prevent or decrease complications (Diabetes Survival Skills Education) will improve Outcome: Progressing Goal: Individualized Educational Video(s) Outcome: Progressing   Problem: Coping: Goal: Ability to adjust to condition or change in health will improve Outcome: Progressing   Problem: Fluid Volume: Goal: Ability to  maintain a balanced intake and output will improve Outcome: Progressing   Problem: Metabolic: Goal: Ability to maintain appropriate glucose levels will improve Outcome: Progressing   Problem: Nutritional: Goal: Maintenance of adequate nutrition will improve Outcome: Progressing Goal: Progress toward achieving an optimal weight will improve Outcome: Progressing   Problem: Skin Integrity: Goal: Risk for impaired skin integrity will decrease Outcome: Progressing   Problem: Tissue Perfusion: Goal: Adequacy of tissue perfusion will improve Outcome: Progressing   Problem: Education: Goal: Knowledge of General Education information will improve Description: Including pain rating scale, medication(s)/side effects and non-pharmacologic comfort measures Outcome: Progressing   Problem: Health Behavior/Discharge Planning: Goal: Ability to manage health-related needs will improve Outcome: Progressing   Problem: Clinical Measurements: Goal: Ability to maintain clinical measurements within normal limits will improve Outcome: Progressing Goal: Will remain free from infection Outcome: Progressing

## 2023-08-17 NOTE — Plan of Care (Signed)
Patient more alert today ? ?

## 2023-08-18 ENCOUNTER — Other Ambulatory Visit (HOSPITAL_COMMUNITY): Payer: Self-pay

## 2023-08-18 DIAGNOSIS — R4182 Altered mental status, unspecified: Secondary | ICD-10-CM | POA: Diagnosis not present

## 2023-08-18 DIAGNOSIS — J690 Pneumonitis due to inhalation of food and vomit: Secondary | ICD-10-CM | POA: Insufficient documentation

## 2023-08-18 LAB — GLUCOSE, CAPILLARY
Glucose-Capillary: 124 mg/dL — ABNORMAL HIGH (ref 70–99)
Glucose-Capillary: 80 mg/dL (ref 70–99)
Glucose-Capillary: 86 mg/dL (ref 70–99)
Glucose-Capillary: 98 mg/dL (ref 70–99)

## 2023-08-18 LAB — CBC WITH DIFFERENTIAL/PLATELET
Abs Immature Granulocytes: 2.38 10*3/uL — ABNORMAL HIGH (ref 0.00–0.07)
Basophils Absolute: 0.1 10*3/uL (ref 0.0–0.1)
Basophils Relative: 0 %
Eosinophils Absolute: 0.2 10*3/uL (ref 0.0–0.5)
Eosinophils Relative: 1 %
HCT: 35.6 % — ABNORMAL LOW (ref 36.0–46.0)
Hemoglobin: 11 g/dL — ABNORMAL LOW (ref 12.0–15.0)
Immature Granulocytes: 16 %
Lymphocytes Relative: 23 %
Lymphs Abs: 3.5 10*3/uL (ref 0.7–4.0)
MCH: 29.3 pg (ref 26.0–34.0)
MCHC: 30.9 g/dL (ref 30.0–36.0)
MCV: 94.7 fL (ref 80.0–100.0)
Monocytes Absolute: 1 10*3/uL (ref 0.1–1.0)
Monocytes Relative: 6 %
Neutro Abs: 8.1 10*3/uL — ABNORMAL HIGH (ref 1.7–7.7)
Neutrophils Relative %: 54 %
Platelets: 279 10*3/uL (ref 150–400)
RBC: 3.76 MIL/uL — ABNORMAL LOW (ref 3.87–5.11)
RDW: 16.8 % — ABNORMAL HIGH (ref 11.5–15.5)
Smear Review: ADEQUATE
WBC: 15.2 10*3/uL — ABNORMAL HIGH (ref 4.0–10.5)
nRBC: 0.8 % — ABNORMAL HIGH (ref 0.0–0.2)

## 2023-08-18 LAB — BASIC METABOLIC PANEL
Anion gap: 8 (ref 5–15)
BUN: 10 mg/dL (ref 8–23)
CO2: 28 mmol/L (ref 22–32)
Calcium: 7.7 mg/dL — ABNORMAL LOW (ref 8.9–10.3)
Chloride: 107 mmol/L (ref 98–111)
Creatinine, Ser: 0.55 mg/dL (ref 0.44–1.00)
GFR, Estimated: >60 mL/min (ref >60)
Glucose, Bld: 93 mg/dL (ref 70–99)
Potassium: 4.1 mmol/L (ref 3.5–5.1)
Sodium: 143 mmol/L (ref 135–145)

## 2023-08-18 LAB — BRAIN NATRIURETIC PEPTIDE: B Natriuretic Peptide: 314.1 pg/mL — ABNORMAL HIGH (ref 0.0–100.0)

## 2023-08-18 LAB — MAGNESIUM: Magnesium: 1.8 mg/dL (ref 1.7–2.4)

## 2023-08-18 MED ORDER — DIVALPROEX SODIUM 250 MG PO DR TAB
250.0000 mg | DELAYED_RELEASE_TABLET | Freq: Every evening | ORAL | Status: DC
  Filled 2023-08-18: qty 1

## 2023-08-18 MED ORDER — LACTULOSE 10 GM/15ML PO SOLN
20.0000 g | Freq: Two times a day (BID) | ORAL | 0 refills | Status: DC
  Filled 2023-08-18: qty 946, 16d supply, fill #0

## 2023-08-18 MED ORDER — DIVALPROEX SODIUM 250 MG PO DR TAB: 250.0000 mg | DELAYED_RELEASE_TABLET | Freq: Every evening | ORAL | Status: DC

## 2023-08-18 MED ORDER — AMOXICILLIN-POT CLAVULANATE 875-125 MG PO TABS
1.0000 | ORAL_TABLET | Freq: Two times a day (BID) | ORAL | 0 refills | Status: DC
  Filled 2023-08-18: qty 14, 7d supply, fill #0

## 2023-08-18 NOTE — Discharge Instructions (Signed)
Follow with Primary MD [name], [name], MD in 7 days     Disposition Home   Diet: Dysphagia 3 with thin liquids   On your next visit with your primary care physician please Get Medicines reviewed and adjusted.   Please request your Prim.MD to go over all Hospital Tests and Procedure/Radiological results at the follow up, please get all Hospital records sent to your Prim MD by signing hospital release before you go home.   If you experience worsening of your admission symptoms, develop shortness of breath, life threatening emergency, suicidal or homicidal thoughts you must seek medical attention immediately by calling 911 or calling your MD immediately  if symptoms less severe.  You Must read complete instructions/literature along with all the possible adverse reactions/side effects for all the Medicines you take and that have been prescribed to you. Take any new Medicines after you have completely understood and accpet all the possible adverse reactions/side effects.   Do not drive, operating heavy machinery, perform activities at heights, swimming or participation in water activities or provide baby sitting services if your were admitted for syncope or siezures until you have seen by Primary MD or a Neurologist and advised to do so again.  Do not drive when taking Pain medications.    Do not take more than prescribed Pain, Sleep and Anxiety Medications  Special Instructions: If you have smoked or chewed Tobacco  in the last 2 yrs please stop smoking, stop any regular Alcohol  and or any Recreational drug use.  Wear Seat belts while driving.   Please note  You were cared for by a hospitalist during your hospital stay. If you have any questions about your discharge medications or the care you received while you were in the hospital after you are discharged, you can call the unit and asked to speak with the hospitalist on call if the hospitalist that took care of you is not available.  Once you are discharged, your primary care physician will handle any further medical issues. Please note that NO REFILLS for any discharge medications will be authorized once you are discharged, as it is imperative that you return to your primary care physician (or establish a relationship with a primary care physician if you do not have one) for your aftercare needs so that they can reassess your need for medications and monitor your lab values.

## 2023-08-18 NOTE — Plan of Care (Signed)
  Problem: Education: Goal: Ability to describe self-care measures that may prevent or decrease complications (Diabetes Survival Skills Education) will improve Outcome: Not Applicable Goal: Individualized Educational Video(s) Outcome: Not Applicable   Problem: Coping: Goal: Ability to adjust to condition or change in health will improve Outcome: Completed/Met   Problem: Fluid Volume: Goal: Ability to maintain a balanced intake and output will improve Outcome: Progressing   Problem: Metabolic: Goal: Ability to maintain appropriate glucose levels will improve Outcome: Completed/Met   Problem: Nutritional: Goal: Maintenance of adequate nutrition will improve Outcome: Completed/Met Goal: Progress toward achieving an optimal weight will improve Outcome: Progressing   Problem: Tissue Perfusion: Goal: Adequacy of tissue perfusion will improve Outcome: Progressing

## 2023-08-18 NOTE — TOC Transition Note (Signed)
Transition of Care [facility]) - CM/SW Discharge Note   Patient Details  Name: Christine Newton MRN: [id number] Date of Birth: March 01, 1933  Transition of Care [facility] CM/SW Contact:  Christine Clement, RN Phone Number: 08/18/2023, 11:05 AM   Clinical Narrative:      Patient will DC to home today . [facility] will transport .  Patient has all  DME in the home. Hospital Bed, Wheelchair, Nurse, adult and [employer].  Patient has a caregiver during the day Christine Newton [phone]) until Patient's Daughter gets home from work. Patient is current with [facility]- PT/OT SW ordered  Christine Newton has been called and is in the home waiting for patient to arrive   No additional TOC needs            Patient Goals and CMS Choice      Discharge Placement                         Discharge Plan and Services Additional resources added to the After Visit Summary for                                       Social Determinants of Health (SDOH) Interventions SDOH Screenings   Food Insecurity: No Food Insecurity (08/12/2023)  Housing: Low Risk  (08/12/2023)  Transportation Needs: No Transportation Needs (08/12/2023)  Utilities: Not At Risk (08/12/2023)  Depression (PHQ2-9): Low Risk  (06/16/2023)  Tobacco Use: Low Risk  (06/19/2023)     Readmission Risk Interventions     No data to display

## 2023-08-18 NOTE — Progress Notes (Signed)
   08/18/23 1101  Medical Necessity for Transport Certificate --- IF THIS TRANSPORT IS ROUND TRIP OR SCHEDULED AND REPEATED, A PHYSICIAN MUST COMPLETE THIS FORM  Transport from: (Location) [facility]  Transport to (Location) Other (Comment) ([address] [place] [address])  Did the patient arrive from a Skilled Nursing Facility, Assisted Living Facility or Group Home? No  Date of Transport Service 08/18/23  Name of Transporting Agency [facility]  Round Trip Transport? No  Reason for Transport Discharge  Is this a hospice patient? No  Describe the Medical Condition dementia, hypertension, TIA, diabetes mellitus, diastolic congestive heart failure, seizures, syncope, status post aortic valve replacement and LVH.  Q1 Are ALL the following "true"? 1. Patient unable to get up from bed without assistance  AND  2. Unable to ambulate  AND  3. Unable to sit in a chair, including wheelchair. Yes  Q2 Could the patient be transported safely by other means of transportation (I.E., wheelchair van)? No  Q3 Please check any of the following conditions that apply at the time of transport: Risk of injury to self and/or others;Decreased level of consciousness;Altered mental status  Electronic Signature Jonathon Jordan Case Manager  Credentials RN  Date Signed 08/18/23  Print Form Print

## 2023-08-18 NOTE — Discharge Summary (Signed)
Physician Discharge Summary  Christine Newton KGM:[id number] DOB: 1933/10/31 DOA: 08/11/2023  PCP: Christine Ohara, MD  Admit date: 08/11/2023 Discharge date: 08/18/2023  Admitted From: (Home) Disposition:  (Home )  Recommendations for Outpatient Follow-up:  Outpatient palliative to follow   Discharge Condition: (Consider hospice if she continues to deteriorate) CODE STATUS: (DNR)   Brief/Interim Summary:  89 year old African-American female with past medical history significant for dementia, hypertension, TIA, diabetes mellitus, diastolic congestive heart failure, seizures, syncope, status post aortic valve replacement and LVH. Patient was noted to have decreased responsiveness earlier today, with reported right-sided weakness, confusion and lethargy. Patient is also reported to have dry cough especially while eating at home, increased nasal congestion. She had MRI brain which was nonacute, CT chest confirmed possible pneumonia, she also was noted to have left-sided upper and lower extremity swelling and she was admitted for further care.   Acute metabolic encephalopathy in the setting of underlying dementia and aspiration pneumonia  Dementia  Aspiration pneumonia  Failure to thrive  Deconditioning -Worsening mental status due to aspiration pneumonia, in the setting of this patient with underlying severe deconditioning, and dementia  - MRI brain nonacute, no focal deficits, repeat CT on 8/19/2024nonacute, stable ABG, stable EEG - ammonia minimally elevated but does not explain her mental status, placed on lactulose.   -Aspiration pneumonia being treated, being seen by speech, currently advanced to dysphagia 3 diet, mentation has improved, back to baseline . -Medicine has been following, patient with difficult dementia, palliative to follow as an outpatient, and hospice can be considered in outpatient setting if she is to deteriorate again -Treated with IV Unasyn.   Dehydration and  hypokalemia.   -Resolved with IV fluids.   Swollen left sided upper and lower extremities.   - Venous duplex negative for upper and lower extremity nonacute, this appears to be dependent edema, she does favor her left side when she sleeps according to daughter, improve positioning and place pillows if needed.   Incidental thyroid nodule.   Outpatient thyroid ultrasound by PCP as desired.   Hypertension.  Blood pressure has been soft, so no meds at time of discharge   History of chronic diastolic CHF, previous history of aortic valve replacement, history of underlying left bundle branch block.  Compensated, no acute issues.  EF 60% on echocardiogram 1 year ago, monitor.   Dyslipidemia.  On statin.  History of SVT-continue with home dose metoprolol  History of seizures -Continue with home dose Depakote   Discharge Diagnoses:  Principal Problem:   Altered mental status Active Problems:   Hypertensive heart disease with chronic diastolic congestive heart failure (HCC)   Hyperlipidemia   History of multiple strokes   Dementia (HCC)   Non-insulin dependent type 2 diabetes mellitus (HCC)   Paroxysmal SVT (supraventricular tachycardia)   Physical deconditioning   Seizure disorder (HCC)   Aspiration pneumonia [facility])    Discharge Instructions  Discharge Instructions     Diet - low sodium heart healthy   Complete by: As directed    Discharge instructions   Complete by: As directed    Follow with Primary MD [name], [name], MD in 7 days     Disposition Home   Diet: Dysphagia 3 with thin liquids   On your next visit with your primary care physician please Get Medicines reviewed and adjusted.   Please request your Prim.MD to go over all Hospital Tests and Procedure/Radiological results at the follow up, please get all Hospital records sent to your [facility]  MD by signing hospital release before you go home.   If you experience worsening of your admission symptoms, develop  shortness of breath, life threatening emergency, suicidal or homicidal thoughts you must seek medical attention immediately by calling 911 or calling your MD immediately  if symptoms less severe.  You Must read complete instructions/literature along with all the possible adverse reactions/side effects for all the Medicines you take and that have been prescribed to you. Take any new Medicines after you have completely understood and accpet all the possible adverse reactions/side effects.   Do not drive, operating heavy machinery, perform activities at heights, swimming or participation in water activities or provide baby sitting services if your were admitted for syncope or siezures until you have seen by Primary MD or a Neurologist and advised to do so again.  Do not drive when taking Pain medications.    Do not take more than prescribed Pain, Sleep and Anxiety Medications  Special Instructions: If you have smoked or chewed Tobacco  in the last 2 yrs please stop smoking, stop any regular Alcohol  and or any Recreational drug use.  Wear Seat belts while driving.   Please note  You were cared for by a hospitalist during your hospital stay. If you have any questions about your discharge medications or the care you received while you were in the hospital after you are discharged, you can call the unit and asked to speak with the hospitalist on call if the hospitalist that took care of you is not available. Once you are discharged, your primary care physician will handle any further medical issues. Please note that NO REFILLS for any discharge medications will be authorized once you are discharged, as it is imperative that you return to your primary care physician (or establish a relationship with a primary care physician if you do not have one) for your aftercare needs so that they can reassess your need for medications and monitor your lab values.   Increase activity slowly   Complete by: As  directed    No wound care   Complete by: As directed       Allergies as of 08/18/2023       Reactions   Cephalosporins Nausea Only        Medication List     STOP taking these medications    lisinopril 10 MG tablet Commonly known as: ZESTRIL       TAKE these medications    amoxicillin-clavulanate 875-125 MG tablet Commonly known as: AUGMENTIN Take 1 tablet by mouth 2 (two) times daily.   aspirin EC 81 MG tablet Take 81 mg by mouth daily. Swallow whole.   atorvastatin 40 MG tablet Commonly known as: LIPITOR TAKE 1 TABLET BY MOUTH EVERY DAY   Constulose 10 GM/15ML solution Generic drug: lactulose Take 30 mLs (20 g total) by mouth 2 (two) times daily. Please hold if more than 2 BMs per day   divalproex 250 MG DR tablet Commonly known as: DEPAKOTE Take 1 tablet (250 mg total) by mouth every evening. What changed: when to take this   erythromycin ophthalmic ointment Place 1 Application into the right eye daily as needed (eye drainage).   metoprolol tartrate 25 MG tablet Commonly known as: LOPRESSOR TAKE 1/2 TABLET TWICE A DAY BY MOUTH   Mucinex Maximum Strength 1200 MG Tb12 Generic drug: Guaifenesin Take 1,200 mg by mouth daily as needed (chest congestion).        Follow-up Information  [facility] ([facility] Follow up.   Why: Iantha Fallen will contact you within 48 hours to schedule home health visit Contact information: [address] [place] [address] [phone]                Allergies  Allergen Reactions   Cephalosporins Nausea Only    Consultations: Palliaitve medicine  Procedures/Studies: EEG adult  Result Date: 09/05/23 Christine Quest, MD     09-05-23  2:27 PM Patient Name: Christine Newton MRN: [id number] Epilepsy Attending: Charlsie Newton Referring Physician/Provider: Leroy Sea, MD Date: 09-05-2023 Duration: 23.17 mins Patient history: 89yo F with ams getting eeg to  evaluate for seizure Level of alertness: Awake AEDs during EEG study: VPA Technical aspects: This EEG study was done with scalp electrodes positioned according to the 10-20 International system of electrode placement. Electrical activity was reviewed with band pass filter of 1-70Hz , sensitivity of 7 uV/mm, display speed of 68mm/sec with a 60Hz  notched filter applied as appropriate. EEG data were recorded continuously and digitally stored.  Video monitoring was available and reviewed as appropriate. Description: The posterior dominant rhythm consists of 7 Hz activity of moderate voltage (25-35 uV) seen predominantly in posterior head regions, symmetric and reactive to eye opening and eye closing. EEG showed continuous generalized 5 to 7 Hz theta slowing. Hyperventilation and photic stimulation were not performed.   ABNORMALITY - Continuous slow, generalized IMPRESSION: This study is suggestive of moderate diffuse encephalopathy, nonspecific etiology. No seizures or epileptiform discharges were seen throughout the recording. Priyanka Annabelle Harman   CT HEAD WO CONTRAST ( )  Result Date: 2023-09-05 CLINICAL DATA:  Mental status change, unknown cause EXAM: CT HEAD WITHOUT CONTRAST TECHNIQUE: Contiguous axial images were obtained from the base of the skull through the vertex without intravenous contrast. RADIATION DOSE REDUCTION: This exam was performed according to the departmental dose-optimization program which includes automated exposure control, adjustment of the mA and/or kV according to patient size and/or use of iterative reconstruction technique. COMPARISON:  CT head 08/11/2023. FINDINGS: Brain: Similar remote infarcts in the caudate and left parietal lobe. No evidence of acute large vascular territory infarct, acute hemorrhage, or midline shift. Patchy white matter hypodensities are nonspecific but compatible with chronic microvascular ischemic disease. Grossly similar sellar/suprasellar mass. Vascular:  Calcific atherosclerosis. Skull: No acute fracture. Sinuses/Orbits: Left sphenoid sinus mucosal thickening. No acute orbital findings. Other: No mastoid effusions. IMPRESSION: 1. No evidence of acute intracranial abnormality. 2. Similar remote infarcts and chronic microvascular ischemic change. 3. Grossly similar sellar/suprasellar mass. Electronically Signed   By: Feliberto Harts M.D.   On: 2023-09-05 11:55   DG Chest Port 1 View  Result Date: 2023-09-05 CLINICAL DATA:  Shortness of breath. EXAM: PORTABLE CHEST 1 VIEW COMPARISON:  August 12, 2023. FINDINGS: Stable cardiomediastinal silhouette. Status post aortic valve repair. Moderate left pleural effusion is noted with associated left basilar atelectasis. Right lung is unremarkable. Bony thorax is unremarkable. IMPRESSION: Moderate left pleural effusion is noted with associated left basilar atelectasis. Electronically Signed   By: Lupita Raider M.D.   On: 09/05/23 08:21   VAS [name] UPPER EXTREMITY VENOUS DUPLEX  Result Date: 08/13/2023 UPPER VENOUS STUDY  Patient Name:  Christine Newton  Date of Exam:   08/12/2023 Medical Rec #: [id number]            Accession #:    [id number] Date of Birth: 17-Sep-1933            Patient Gender: F Patient  Age:   65 years Exam Location:  [facility] Procedure:      VAS [name] UPPER EXTREMITY VENOUS DUPLEX Referring Phys: SYLVESTER OGBATA --------------------------------------------------------------------------------  Indications: Edema Limitations: Body habitus, poor ultrasound/tissue interface, bandages, line and Subcutaneous edema, patient's altered mental status (patient asking me to stop exam multiple times). Comparison Study: No prior study Performing Technologist: Sherren Kerns RVS  Examination Guidelines: A complete evaluation includes B-mode imaging, spectral Doppler, color Doppler, and power Doppler as needed of all accessible portions of each vessel. Bilateral testing is considered an integral part of a  complete examination. Limited examinations for reoccurring indications may be performed as noted.  Right Findings: +----------+------------+---------+-----------+----------+---------------------+ RIGHT     CompressiblePhasicitySpontaneousProperties       Summary        +----------+------------+---------+-----------+----------+---------------------+ IJV           Full       Yes       Yes                                    +----------+------------+---------+-----------+----------+---------------------+ Subclavian               Yes       Yes                                    +----------+------------+---------+-----------+----------+---------------------+ Axillary                 Yes       Yes                                    +----------+------------+---------+-----------+----------+---------------------+ Brachial      Full       Yes       Yes                                    +----------+------------+---------+-----------+----------+---------------------+ Cephalic      Full                                                        +----------+------------+---------+-----------+----------+---------------------+ Basilic                  Yes       Yes               patent by color and                                                             Doppler        +----------+------------+---------+-----------+----------+---------------------+  Left Findings: +----------+------------+---------+-----------+----------+--------------+ LEFT      CompressiblePhasicitySpontaneousProperties   Summary     +----------+------------+---------+-----------+----------+--------------+ IJV           Full       Yes       Yes                             +----------+------------+---------+-----------+----------+--------------+  Subclavian    Full       Yes       Yes                             +----------+------------+---------+-----------+----------+--------------+  Brachial      Full       Yes       Yes                             +----------+------------+---------+-----------+----------+--------------+ Radial                                              Not visualized +----------+------------+---------+-----------+----------+--------------+ Ulnar                                               Not visualized +----------+------------+---------+-----------+----------+--------------+ Cephalic                                            Not visualized +----------+------------+---------+-----------+----------+--------------+ Basilic                                             Not visualized +----------+------------+---------+-----------+----------+--------------+  Summary:  Right: No evidence of deep vein thrombosis in the upper extremity. No evidence of superficial vein thrombosis in the upper extremity.  Left: No DVT or SVT noted in the visualized veins of the left upper extremity. Significant subcutaneous edema noted.  *See table(s) above for measurements and observations.  Diagnosing physician: Lemar Livings MD Electronically signed by Lemar Livings MD on 08/13/2023 at 1:49:26 PM.    Final    VAS [name] LOWER EXTREMITY VENOUS (DVT)  Result Date: 08/13/2023  Lower Venous DVT Study Patient Name:  Christine Newton  Date of Exam:   08/12/2023 Medical Rec #: [id number]            Accession #:    [id number] Date of Birth: Sep 08, 1933            Patient Gender: F Patient Age:   35 years Exam Location:  [facility] Procedure:      VAS [name] LOWER EXTREMITY VENOUS (DVT) Referring Phys: Jacqlyn Krauss OGBATA --------------------------------------------------------------------------------  Indications: Edema.  Risk Factors: Dementia, chronic CHF, valvular hear disease, status post aortic valve replacement. Limitations: Patient condition/confusion, body habitus and poor ultrasound/tissue interface secondary to subcutaneous edema and arterial plaque shadowing,  chronic contractures of the left lower extremity . Comparison Study: No prior study Performing Technologist: Sherren Kerns RVS  Examination Guidelines: A complete evaluation includes B-mode imaging, spectral Doppler, color Doppler, and power Doppler as needed of all accessible portions of each vessel. Bilateral testing is considered an integral part of a complete examination. Limited examinations for reoccurring indications may be performed as noted. The reflux portion of the exam is performed with the patient in reverse Trendelenburg.  +------+---------------+---------+-----------+----------+--------------+ RIGHT CompressibilityPhasicitySpontaneityPropertiesThrombus Aging +------+---------------+---------+-----------+----------+--------------+ FV Mid  Yes      Yes                                 +------+---------------+---------+-----------+----------+--------------+ Extremely limited and technically difficult study secondary to body habitus, edema, and acoustic shadowing from arterial plaque. Non diagnostic study   Extremely limited and technically difficult study secondary to body habitus, edema, and acoustic shadowing from arterial plaque. Non diagnostic study    Summary: RIGHT: - Extremely limited and technically difficult study secondary to body habitus, edema, and acoustic shadowing from arterial plaque. Non diagnostic study  LEFT: - Extremely limited and technically difficult study secondary to body habitus, edema, and acoustic shadowing from arterial plaque. Non diagnostic study  *See table(s) above for measurements and observations. Electronically signed by Lemar Livings MD on 08/13/2023 at 1:49:18 PM.    Final    DG Chest Port 1 View  Result Date: 08/12/2023 CLINICAL DATA:  Shortness of breath. EXAM: PORTABLE CHEST 1 VIEW COMPARISON:  08/11/2023 FINDINGS: Lungs are hypoinflated with stable small left pleural effusion likely with associated left basilar atelectasis unchanged.  Subtle linear density right base unchanged likely atelectasis/scarring. Cardiomediastinal silhouette and remainder of the exam is unchanged. IMPRESSION: 1. Hypoinflation with stable small left pleural effusion likely with associated left basilar atelectasis. 2. Stable right basilar atelectasis/scarring. Electronically Signed   By: Elberta Fortis M.D.   On: 08/12/2023 08:23   CT CHEST WO CONTRAST  Result Date: 08/11/2023 CLINICAL DATA:  Respiratory illness EXAM: CT CHEST WITHOUT CONTRAST TECHNIQUE: Multidetector CT imaging of the chest was performed following the standard protocol without IV contrast. RADIATION DOSE REDUCTION: This exam was performed according to the departmental dose-optimization program which includes automated exposure control, adjustment of the mA and/or kV according to patient size and/or use of iterative reconstruction technique. COMPARISON:  None Available. FINDINGS: Cardiovascular: No significant vascular findings. Normal heart size. No pericardial effusion. There are atherosclerotic calcifications of the aorta and coronary arteries. Mediastinum/Nodes: Thyroid gland is heterogeneous and enlarged, particularly the left thyroid. Multiple calcifications are present. This causes shift of the trachea to the right proximally 1 cm. Trachea is widely patent. There are no enlarged mediastinal or hilar lymph nodes. Esophagus is unremarkable. Lungs/Pleura: There is atelectasis in the bilateral lower lobes. There is a small amount of patchy airspace disease in the inferior left upper lobe. There is no pneumothorax or pleural effusion. Upper Abdomen: Bilateral renal calculi are present. Musculoskeletal: The bones are osteopenic. T7 compression deformity is age indeterminate (moderate). IMPRESSION: 1. Small amount of patchy airspace disease in the inferior left upper lobe may be infectious/inflammatory. 2. Bilateral lower lobe atelectasis. 3. Incidental right thyroid nodule with heterogeneous and  enlarged thyroid measuring 1 cm. Recommend non-emergent thyroid ultrasound if clinically warranted given patient age. Reference: J Am Coll Radiol. 2015 Feb;12(2): 143-50 4. Aortic Atherosclerosis (ICD10-I70.0). Electronically Signed   By: Darliss Cheney M.D.   On: 08/11/2023 17:26   DG Chest 1 View  Result Date: 08/11/2023 CLINICAL DATA:  Altered mental status EXAM: CHEST  1 VIEW COMPARISON:  Chest radiograph dated 01/31/2023 FINDINGS: Low lung volumes. Left basilar patchy opacities. Unchanged blunting of the left costophrenic angle. No pneumothorax. Similar cardiomediastinal silhouette status post valve replacement. No acute osseous abnormality. IMPRESSION: 1. Left basilar patchy opacities, which may represent atelectasis, aspiration, or pneumonia. 2. Unchanged blunting of the left costophrenic angle, which may represent a small pleural effusion. Electronically Signed   By: Benjaman Kindler  Xu M.D.   On: 08/11/2023 15:14   MR BRAIN WO CONTRAST  Result Date: 08/11/2023 CLINICAL DATA:  Sudden onset of left-sided facial droop and right-sided weakness. EXAM: MRI HEAD WITHOUT CONTRAST TECHNIQUE: Multiplanar, multiecho pulse sequences of the brain and surrounding structures were obtained without intravenous contrast. COMPARISON:  Head CT from earlier today FINDINGS: Brain: No acute infarction, hemorrhage, hydrocephalus, extra-axial collection or mass effect. Generalized atrophy, cortical thinning is advanced. Chronic left superior frontal and parietal cortically based infarcts. Small chronic bilateral cerebellar infarcts. Ischemic gliosis bilaterally in the deep cerebral white matter. There is a lobulated sellar and suprasellar mass indistinguishable from the pituitary gland and involving the left cavernous sinus, up to 2.3 cm on sagittal T1 weighted imaging, most consistent with pituitary adenoma and known from multiple prior studies. The mass appears contracted when compared to 2021. Vascular: Major flow voids are  preserved Skull and upper cervical spine: No focal marrow lesion. Sinuses/Orbits: No acute finding Other: Intermittently moderate motion artifact. IMPRESSION: 1. Motion degraded brain MRI without acute finding including infarct. 2. Chronic small vessel disease and remote left frontal and parietal cortex infarcts. Generalized brain atrophy. 3. Known pituitary adenoma with decreased size since 2021. Electronically Signed   By: Tiburcio Pea M.D.   On: 08/11/2023 13:31   CT HEAD CODE STROKE WO CONTRAST  Result Date: 08/11/2023 CLINICAL DATA:  Code stroke. Neuro deficit, acute, stroke suspected. Left-sided facial droop. Left-sided weakness. EXAM: CT HEAD WITHOUT CONTRAST TECHNIQUE: Contiguous axial images were obtained from the base of the skull through the vertex without intravenous contrast. RADIATION DOSE REDUCTION: This exam was performed according to the departmental dose-optimization program which includes automated exposure control, adjustment of the mA and/or kV according to patient size and/or use of iterative reconstruction technique. COMPARISON:  CT head without contrast 02/02/2023. MR head without contrast 11/18/2022. FINDINGS: Brain: A remote cortical infarct of the left parietal lobe is stable. Remote lacunar infarcts of the caudate head bilaterally are stable. No acute infarct, hemorrhage, or mass lesion is present. The ventricles are proportionate to the degree of atrophy. No significant extraaxial fluid collection is present. Remote lacunar infarcts are again noted in the cerebellum bilaterally. The brainstem is unremarkable. A 1.6 cm sellar and suprasellar mass lesion is again noted. Vascular: Atherosclerotic calcifications are present in the cavernous internal carotid arteries. No hyperdense vessel is present. Skull: Calvarium is intact. No focal lytic or blastic lesions are present. No significant extracranial soft tissue lesion is present. Sinuses/Orbits: The paranasal sinuses and mastoid air  cells are clear. Bilateral lens replacements are noted. Globes and orbits are otherwise unremarkable. ASPECTS [facility] Stroke Program Early CT Score) - Ganglionic level infarction (caudate, lentiform nuclei, internal capsule, insula, M1-M3 cortex): 7/7 - Supraganglionic infarction (M4-M6 cortex): 3/3 Total score (0-10 with 10 being normal): 10/10 IMPRESSION: 1. No acute intracranial abnormality or significant interval change. 2. 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 3. Stable remote lacunar infarcts of the caudate head bilaterally and cerebellum bilaterally. 4. Stable 1.6 cm sellar and suprasellar mass lesion. 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 The above was relayed via text pager to Dr. Selina Cooley on 08/11/2023 at 10:07 . Electronically Signed   By: Marin Roberts M.D.   On: 08/11/2023 10:07      Subjective:   Discharge Exam: Vitals:   08/18/23 0359 08/18/23 0819  BP: 105/68   Pulse: 88   Resp: 14 (!) 21  Temp: (!) 97.1 F (36.2 C) 97.9 F (36.6 C)  SpO2: 98%    Vitals:   08/18/23 0000 08/18/23 0100 08/18/23 0359 08/18/23 0819  BP: 109/70  105/68   Pulse: 88 88 88   Resp: 15  14 (!) 21  Temp:   (!) 97.1 F (36.2 C) 97.9 F (36.6 C)  TempSrc:   Oral Oral  SpO2: 96% 99% 98%   Weight:      Height:        Awake Alert, more coherent and appropriate today, extremely frail and deconditioned Symmetrical Chest wall movement, Good air movement bilaterally, CTAB RRR,No Gallops,Rubs or new Murmurs, No Parasternal Heave +ve B.Sounds, Abd Soft, No tenderness, No rebound - guarding or rigidity. Chronic contracture in upper and lower extremities    The results of significant diagnostics from this hospitalization (including imaging, microbiology, ancillary and laboratory) are listed below for reference.     Microbiology: Recent Results (from the past 240 hour(s))  Urine Culture     Status: Abnormal   Collection Time: 08/11/23 11:46 AM   Specimen: In/Out  Cath Urine  Result Value Ref Range Status   Specimen Description IN/OUT CATH URINE  Final   Special Requests NONE  Final   Culture (A)  Final    40,000 COLONIES/mL AEROCOCCUS SPECIES Standardized susceptibility testing for this organism is not available. Performed at [facility], [address] [place], [place] [address]    Report Status 08/13/2023 FINAL  Final  SARS Coronavirus 2 by RT PCR (hospital order, performed in [facility] hospital lab) *cepheid single result test* Anterior Nasal Swab     Status: None   Collection Time: 08/11/23  1:10 PM   Specimen: Anterior Nasal Swab  Result Value Ref Range Status   SARS Coronavirus 2 by RT PCR NEGATIVE NEGATIVE Final    Comment: Performed at [facility], [address], [place], [place] [address]  Culture, blood (single)     Status: None (Preliminary result)   Collection Time: 08/11/23  5:22 PM   Specimen: BLOOD LEFT HAND  Result Value Ref Range Status   Specimen Description BLOOD LEFT HAND  Final   Special Requests   Final    BOTTLES DRAWN AEROBIC AND ANAEROBIC Blood Culture adequate volume   Culture  Setup Time   Final    GRAM POSITIVE COCCI AEROBIC BOTTLE ONLY Organism ID to follow CRITICAL RESULT CALLED TO, READ BACK BY AND VERIFIED WITH: K HURTH,PHARMD@0725  08/13/23 MK    Culture   Final    GRAM POSITIVE COCCI [facility] FOR ID Performed at [facility], [address] [place], [place] [address]    Report Status PENDING  Incomplete  Blood Culture ID Panel (Reflexed)     Status: Abnormal   Collection Time: 08/11/23  5:22 PM  Result Value Ref Range Status   Enterococcus faecalis NOT DETECTED NOT DETECTED Final   Enterococcus Faecium NOT DETECTED NOT DETECTED Final   Listeria monocytogenes NOT DETECTED NOT DETECTED Final   Staphylococcus species DETECTED (A) NOT DETECTED Final    Comment: CRITICAL RESULT CALLED TO, READ BACK BY AND VERIFIED WITH: K HURTH,PHARMD@0725  08/13/23 MK     Staphylococcus aureus (BCID) NOT DETECTED NOT DETECTED Final   Staphylococcus epidermidis NOT DETECTED NOT DETECTED Final   Staphylococcus lugdunensis NOT DETECTED NOT DETECTED Final   Streptococcus species NOT DETECTED NOT DETECTED Final   Streptococcus agalactiae NOT DETECTED NOT DETECTED Final   Streptococcus pneumoniae NOT DETECTED NOT DETECTED Final   Streptococcus pyogenes NOT DETECTED NOT DETECTED  Final   A.calcoaceticus-baumannii NOT DETECTED NOT DETECTED Final   Bacteroides fragilis NOT DETECTED NOT DETECTED Final   Enterobacterales NOT DETECTED NOT DETECTED Final   Enterobacter cloacae complex NOT DETECTED NOT DETECTED Final   Escherichia coli NOT DETECTED NOT DETECTED Final   Klebsiella aerogenes NOT DETECTED NOT DETECTED Final   Klebsiella oxytoca NOT DETECTED NOT DETECTED Final   Klebsiella pneumoniae NOT DETECTED NOT DETECTED Final   Proteus species NOT DETECTED NOT DETECTED Final   Salmonella species NOT DETECTED NOT DETECTED Final   Serratia marcescens NOT DETECTED NOT DETECTED Final   Haemophilus influenzae NOT DETECTED NOT DETECTED Final   Neisseria meningitidis NOT DETECTED NOT DETECTED Final   Pseudomonas aeruginosa NOT DETECTED NOT DETECTED Final   Stenotrophomonas maltophilia NOT DETECTED NOT DETECTED Final   Candida albicans NOT DETECTED NOT DETECTED Final   Candida auris NOT DETECTED NOT DETECTED Final   Candida glabrata NOT DETECTED NOT DETECTED Final   Candida krusei NOT DETECTED NOT DETECTED Final   Candida parapsilosis NOT DETECTED NOT DETECTED Final   Candida tropicalis NOT DETECTED NOT DETECTED Final   Cryptococcus neoformans/gattii NOT DETECTED NOT DETECTED Final    Comment: Performed at [facility], [address], [place], [place] [address]  MRSA Next Gen by PCR, Nasal     Status: None   Collection Time: 08/12/23  5:38 AM   Specimen: Nasal Mucosa; Nasal Swab  Result Value Ref Range Status   MRSA by PCR Next Gen NOT DETECTED NOT DETECTED Final     Comment: (NOTE) The GeneXpert MRSA Assay (FDA approved for NASAL specimens only), is one component of a comprehensive MRSA colonization surveillance program. It is not intended to diagnose MRSA infection nor to guide or monitor treatment for MRSA infections. Test performance is not FDA approved in patients less than 21 years old. Performed at [facility], [address], [place], [place] [address]      Labs: BNP (last 3 results) Recent Labs    08/16/23 0116 08/17/23 0254 08/18/23 0617  BNP 301.4* 320.1* 314.1*   Basic Metabolic Panel: Recent Labs  Lab 08/11/23 2345 08/12/23 0118 08/14/23 0821 08/15/23 7846 08/16/23 0116 08/17/23 0254 08/18/23 0617  NA  --    < > 143 142 142 140 143  K  --    < > 3.9 3.3* 3.8 3.9 4.1  CL  --    < > 110 108 110 109 107  CO2  --    < > 23 25 25 23 28   GLUCOSE  --    < > 219* 135* 179* 108* 93  BUN  --    < > 16 16 14 14 10   CREATININE 0.42*   < > 0.50 0.51 0.59 0.63 0.55  CALCIUM  --    < > 7.4* 7.6* 7.4* 7.4* 7.7*  MG 2.0   < > 1.8 1.8 1.7 1.8 1.8  PHOS 3.7  --   --   --   --   --   --    < > = values in this interval not displayed.   Liver Function Tests: Recent Labs  Lab 08/11/23 2345  AST 17  ALT 18  ALKPHOS 54  BILITOT 0.4  PROT 5.3*  ALBUMIN 2.2*   No results for input(s): "LIPASE", "AMYLASE" in the last 168 hours. Recent Labs  Lab 08/14/23 1200 08/15/23 0642  AMMONIA 48* 36*   CBC: Recent Labs  Lab 08/14/23 0228 08/14/23 9629 08/15/23 5284  08/16/23 0116 08/17/23 0254 08/18/23 0617  WBC 11.5* 11.2* 11.6* 11.0* 13.7* 15.2*  NEUTROABS 9.6*  --  9.4* 7.8* 7.6 8.1*  HGB 8.7* 8.9* 9.6* 9.6* 10.9* 11.0*  HCT 28.0* 29.1* 31.4* 30.6* 35.4* 35.6*  MCV 97.6 96.7 94.9 94.2 99.2 94.7  PLT 235 218 271 279 288 279   Cardiac Enzymes: No results for input(s): "CKTOTAL", "CKMB", "CKMBINDEX", "TROPONINI" in the last 168 hours. BNP: Invalid input(s): "POCBNP" CBG: Recent Labs  Lab 08/17/23 2320  08/18/23 0049 08/18/23 0357 08/18/23 0813 08/18/23 1201  GLUCAP 139* 124* 98 80 86   D-Dimer No results for input(s): "DDIMER" in the last 72 hours. Hgb A1c No results for input(s): "HGBA1C" in the last 72 hours. Lipid Profile No results for input(s): "CHOL", "HDL", "LDLCALC", "TRIG", "CHOLHDL", "LDLDIRECT" in the last 72 hours. Thyroid function studies No results for input(s): "TSH", "T4TOTAL", "T3FREE", "THYROIDAB" in the last 72 hours.  Invalid input(s): "FREET3" Anemia work up No results for input(s): "VITAMINB12", "FOLATE", "FERRITIN", "TIBC", "IRON", "RETICCTPCT" in the last 72 hours. Urinalysis    Component Value Date/Time   COLORURINE YELLOW 08/14/2023 0725   APPEARANCEUR HAZY (A) 08/14/2023 0725   LABSPEC 1.039 (H) 08/14/2023 0725   PHURINE 5.0 08/14/2023 0725   GLUCOSEU 150 (A) 08/14/2023 0725   HGBUR NEGATIVE 08/14/2023 0725   BILIRUBINUR NEGATIVE 08/14/2023 0725   BILIRUBINUR negative 03/01/2022 1206   BILIRUBINUR Negative 11/01/2021 1152   KETONESUR 5 (A) 08/14/2023 0725   PROTEINUR 30 (A) 08/14/2023 0725   UROBILINOGEN 1.0 03/01/2022 1206   NITRITE NEGATIVE 08/14/2023 0725   LEUKOCYTESUR NEGATIVE 08/14/2023 0725   Sepsis Labs Recent Labs  Lab 08/15/23 0027 08/16/23 0116 08/17/23 0254 08/18/23 0617  WBC 11.6* 11.0* 13.7* 15.2*   Microbiology Recent Results (from the past 240 hour(s))  Urine Culture     Status: Abnormal   Collection Time: 08/11/23 11:46 AM   Specimen: In/Out Cath Urine  Result Value Ref Range Status   Specimen Description IN/OUT CATH URINE  Final   Special Requests NONE  Final   Culture (A)  Final    40,000 COLONIES/mL AEROCOCCUS SPECIES Standardized susceptibility testing for this organism is not available. Performed at [facility], [address], [place], [place] [address]    Report Status 08/13/2023 FINAL  Final  SARS Coronavirus 2 by RT PCR (hospital order, performed in [facility] hospital lab) *cepheid single  result test* Anterior Nasal Swab     Status: None   Collection Time: 08/11/23  1:10 PM   Specimen: Anterior Nasal Swab  Result Value Ref Range Status   SARS Coronavirus 2 by RT PCR NEGATIVE NEGATIVE Final    Comment: Performed at [facility], [address], [place], [place] [address]  Culture, blood (single)     Status: None (Preliminary result)   Collection Time: 08/11/23  5:22 PM   Specimen: BLOOD LEFT HAND  Result Value Ref Range Status   Specimen Description BLOOD LEFT HAND  Final   Special Requests   Final    BOTTLES DRAWN AEROBIC AND ANAEROBIC Blood Culture adequate volume   Culture  Setup Time   Final    GRAM POSITIVE COCCI AEROBIC BOTTLE ONLY Organism ID to follow CRITICAL RESULT CALLED TO, READ BACK BY AND VERIFIED WITH: K HURTH,PHARMD@0725  08/13/23 MK    Culture   Final    GRAM POSITIVE COCCI [facility] [place] FOR ID Performed at [facility], [address] [place], [place] [address]  Report Status PENDING  Incomplete  Blood Culture ID Panel (Reflexed)     Status: Abnormal   Collection Time: 08/11/23  5:22 PM  Result Value Ref Range Status   Enterococcus faecalis NOT DETECTED NOT DETECTED Final   Enterococcus Faecium NOT DETECTED NOT DETECTED Final   Listeria monocytogenes NOT DETECTED NOT DETECTED Final   Staphylococcus species DETECTED (A) NOT DETECTED Final    Comment: CRITICAL RESULT CALLED TO, READ BACK BY AND VERIFIED WITH: K HURTH,PHARMD@0725  08/13/23 MK    Staphylococcus aureus (BCID) NOT DETECTED NOT DETECTED Final   Staphylococcus epidermidis NOT DETECTED NOT DETECTED Final   Staphylococcus lugdunensis NOT DETECTED NOT DETECTED Final   Streptococcus species NOT DETECTED NOT DETECTED Final   Streptococcus agalactiae NOT DETECTED NOT DETECTED Final   Streptococcus pneumoniae NOT DETECTED NOT DETECTED Final   Streptococcus pyogenes NOT DETECTED NOT DETECTED Final   A.calcoaceticus-baumannii NOT DETECTED NOT DETECTED Final   Bacteroides  fragilis NOT DETECTED NOT DETECTED Final   Enterobacterales NOT DETECTED NOT DETECTED Final   Enterobacter cloacae complex NOT DETECTED NOT DETECTED Final   Escherichia coli NOT DETECTED NOT DETECTED Final   Klebsiella aerogenes NOT DETECTED NOT DETECTED Final   Klebsiella oxytoca NOT DETECTED NOT DETECTED Final   Klebsiella pneumoniae NOT DETECTED NOT DETECTED Final   Proteus species NOT DETECTED NOT DETECTED Final   Salmonella species NOT DETECTED NOT DETECTED Final   Serratia marcescens NOT DETECTED NOT DETECTED Final   Haemophilus influenzae NOT DETECTED NOT DETECTED Final   Neisseria meningitidis NOT DETECTED NOT DETECTED Final   Pseudomonas aeruginosa NOT DETECTED NOT DETECTED Final   Stenotrophomonas maltophilia NOT DETECTED NOT DETECTED Final   Candida albicans NOT DETECTED NOT DETECTED Final   Candida auris NOT DETECTED NOT DETECTED Final   Candida glabrata NOT DETECTED NOT DETECTED Final   Candida krusei NOT DETECTED NOT DETECTED Final   Candida parapsilosis NOT DETECTED NOT DETECTED Final   Candida tropicalis NOT DETECTED NOT DETECTED Final   Cryptococcus neoformans/gattii NOT DETECTED NOT DETECTED Final    Comment: Performed at [facility], [address] [place], [place] [address]  MRSA Next Gen by PCR, Nasal     Status: None   Collection Time: 08/12/23  5:38 AM   Specimen: Nasal Mucosa; Nasal Swab  Result Value Ref Range Status   MRSA by PCR Next Gen NOT DETECTED NOT DETECTED Final    Comment: (NOTE) The GeneXpert MRSA Assay (FDA approved for NASAL specimens only), is one component of a comprehensive MRSA colonization surveillance program. It is not intended to diagnose MRSA infection nor to guide or monitor treatment for MRSA infections. Test performance is not FDA approved in patients less than 27 years old. Performed at [facility], [address] [place], [place] [address]      Time coordinating discharge: Over 30 minutes  SIGNED:   Huey Bienenstock, MD  [facility] 08/18/2023, 1:08 PM Pager   If 7PM-7AM, please contact night-coverage [email or web address] Password [email or web address]

## 2023-08-20 LAB — BACTERIAL ORGANISM REFLEX

## 2023-08-21 ENCOUNTER — Telehealth: Payer: Self-pay

## 2023-08-21 ENCOUNTER — Encounter: Payer: Self-pay | Admitting: *Deleted

## 2023-08-21 ENCOUNTER — Telehealth: Payer: Self-pay | Admitting: *Deleted

## 2023-08-21 DIAGNOSIS — I6523 Occlusion and stenosis of bilateral carotid arteries: Secondary | ICD-10-CM | POA: Diagnosis not present

## 2023-08-21 DIAGNOSIS — I5032 Chronic diastolic (congestive) heart failure: Secondary | ICD-10-CM | POA: Diagnosis not present

## 2023-08-21 DIAGNOSIS — S72142D Displaced intertrochanteric fracture of left femur, subsequent encounter for closed fracture with routine healing: Secondary | ICD-10-CM | POA: Diagnosis not present

## 2023-08-21 DIAGNOSIS — R29898 Other symptoms and signs involving the musculoskeletal system: Secondary | ICD-10-CM | POA: Diagnosis not present

## 2023-08-21 DIAGNOSIS — E119 Type 2 diabetes mellitus without complications: Secondary | ICD-10-CM | POA: Diagnosis not present

## 2023-08-21 DIAGNOSIS — E782 Mixed hyperlipidemia: Secondary | ICD-10-CM | POA: Diagnosis not present

## 2023-08-21 DIAGNOSIS — R5381 Other malaise: Secondary | ICD-10-CM | POA: Diagnosis not present

## 2023-08-21 DIAGNOSIS — I350 Nonrheumatic aortic (valve) stenosis: Secondary | ICD-10-CM | POA: Diagnosis not present

## 2023-08-21 DIAGNOSIS — I110 Hypertensive heart disease with heart failure: Secondary | ICD-10-CM | POA: Diagnosis not present

## 2023-08-21 LAB — MISC LABCORP TEST (SEND OUT)
LabCorp test name: 8664
Labcorp test code: 8664

## 2023-08-21 LAB — ORGANISM ID, BACTERIA

## 2023-08-21 NOTE — Telephone Encounter (Signed)
Returned call to [facility] nurse requesting VO to continue PT 2 times a week for 3 weeks, 1 time a week for 1 week. VO given.  Betsy: [phone]

## 2023-08-21 NOTE — Transitions of Care (Post Inpatient/ED Visit) (Signed)
   08/21/2023  Name: Christine Newton MRN: [id number] DOB: [birth year]  Today's TOC FU Call Status: Today's TOC FU Call Status:: Unsuccessful Call (1st Attempt) Unsuccessful Call (1st Attempt) Date: 08/21/23  Attempted to reach the patient regarding the most recent Inpatient visit; left HIPAA compliant voice message requesting call back  Follow Up Plan: Additional outreach attempts will be made to reach the patient to complete the Transitions of Care (Post Inpatient visit) call.   Caryl Pina, RN, BSN, CCRN Alumnus RN CM Care Coordination/ Transition of Care- [facility] Care Management [phone]: direct office

## 2023-08-22 ENCOUNTER — Encounter: Payer: Self-pay | Admitting: *Deleted

## 2023-08-22 ENCOUNTER — Telehealth: Payer: Self-pay | Admitting: *Deleted

## 2023-08-22 NOTE — Transitions of Care (Post Inpatient/ED Visit) (Signed)
08/22/2023  Name: Christine Newton MRN: [id number] DOB: [birth year]  Today's TOC FU Call Status: Today's TOC FU Call Status:: Successful TOC FU Call Completed TOC FU Call Complete Date: 08/22/23 Patient's Name and Date of Birth confirmed.  Transition Care Management Follow-up Telephone Call Date of Discharge: 08/18/23 Discharge Facility: [facility]) Type of Discharge: Inpatient Admission Primary Inpatient Discharge Diagnosis:: Acute Metabolic encephalopathy How have you been since you were released from the hospital?: Better (per daughter/ caregiver: "She is overall better, but she is very swollen up from the fluids she got in the hospital.  We have virtual visits with Dr. Sedalia Muta; I am going to ask her about whether palliative care or hospice should be ordered") Any questions or concerns?: Yes Patient Questions/Concerns:: Daughter unsure if outpatient referral to palliative care was placed at time of hospitalization: I could not verify: she verbalizes plans to discuss with Dr. Sedalia Muta-- she wants to ensure that patient will be able to continue her PT through home health if these resources are placed Patient Questions/Concerns Addressed: Notified Provider of Patient Questions/Concerns, Other: (provided basic education around difference between palliative and hospice care in outpatient setting)  Items Reviewed: Did you receive and understand the discharge instructions provided?: Yes (thoroughly reviewed with patient's caregiver/ daughter who verbalizes good understanding of same) Medications obtained,verified, and reconciled?: Yes (Medications Reviewed) (Full medication reconciliation/ review completed; confirmed patient obtained all newly Rx'd medications as instructed; daughter-manages medications and denies questions/ concerns around medications today) Any new allergies since your discharge?: No Dietary orders reviewed?: Yes Type of Diet Ordered:: "As healthy as possible-- mostly  mediteranean" confirmed following dysphagia diet Do you have support at home?: Yes (daughter confirms that she is primary caregiver/ POA: daughter works full time as Research scientist (physical sciences)-- has private duty caregivers while she is at work) Primary school teacher in Home: child(ren), adult Name of Support/Comfort Primary Source: Daughter reports patient is dependent for all self-care needs; daughter provides all care in evenings after work day is completed; has private duty caregivers during daytime hours while she is at work-- patient is bed-bound  Medications Reviewed Today: Medications Reviewed Today     Reviewed by Michaela Corner, RN (Registered Nurse) on 08/22/23 at 1658  Med List Status: <None>   Medication Order Taking? Sig Documenting Provider Last Dose Status Informant  amoxicillin-clavulanate (AUGMENTIN) 875-125 MG tablet [id number] No Take 1 tablet by mouth 2 (two) times daily.  Patient not taking: Reported on 08/22/2023   Elgergawy, Leana Roe, MD Not Taking Active            Med Note Michaela Corner   Tue Aug 22, 2023  4:28 PM) 08/22/23: daughter reports during [facility] call that she was told by discharging hospital provider to not give-- to keep on  "hand" in case it was needed "in the future;" reports she was told verbally that patient's "pneumonia is all cleared up"  aspirin EC 81 MG tablet [id number] Yes Take 81 mg by mouth daily. Swallow whole. [provider] Taking Active Pharmacy Records, Child  atorvastatin (LIPITOR) 40 MG tablet [id number] No TAKE 1 TABLET BY MOUTH EVERY DAY  Patient not taking: Reported on 08/22/2023   Blane Ohara, MD Not Taking Active Pharmacy Records, Child           Med Note Michaela Corner   Tue Aug 22, 2023  4:30 PM) 08/22/23: daughter reports during [facility] call that she was told by hospital discharging provider patient "did not need to take anymore;" so  she has not been giving  divalproex (DEPAKOTE) 250 MG DR tablet [id number] Yes Take 1 tablet (250 mg total) by mouth every  evening. Elgergawy, Leana Roe, MD Taking Active   erythromycin ophthalmic ointment [id number] Yes Place 1 Application into the right eye daily as needed (eye drainage). [provider] Taking Active Pharmacy Records, Child  Guaifenesin [facility] MAXIMUM STRENGTH) 1200 MG TB12 [id number] Yes Take 1,200 mg by mouth daily as needed (chest congestion). [provider] Taking Active Pharmacy Records, Child           Med Note Michaela Corner   Tue Aug 22, 2023  4:31 PM) 08/22/23: Reports during [facility] call patient has not had to take "in quite awhile"  lactulose (CHRONULAC) 10 GM/15ML solution [id number] No Take 30 mLs (20 g total) by mouth 2 (two) times daily. Please hold if more than 2 BMs per day  Patient not taking: Reported on 08/22/2023   Elgergawy, Leana Roe, MD Not Taking Active            Med Note Michaela Corner   Tue Aug 22, 2023  4:23 PM) 08/22/23: Reports during TOC call not taking: daughter states "she does not need it- she poops regularly"  metoprolol tartrate (LOPRESSOR) 25 MG tablet [id number] Yes TAKE 1/2 TABLET TWICE A DAY BY MOUTH [name], [name], MD Taking Active Pharmacy Records, Child           Home Care and Equipment/Supplies: Were Home Health Services Ordered?: Yes Name of Home Health Agency:: [phone] - PT/ OT Has Agency set up a time to come to your home?: Yes First Home Health Visit Date: 08/21/23 Any new equipment or medical supplies ordered?: No  Functional Questionnaire: Do you need assistance with bathing/showering or dressing?: Yes (daughter and private duty caregivers provide all care for patient) Do you need assistance with meal preparation?: Yes (daughter and private duty caregivers provide all care for patient) Do you need assistance with eating?: Yes (daughter and private duty caregivers provide all care for patient) Do you have difficulty maintaining continence: Yes (daughter and private duty caregivers provide all care for patient-- uses purewick  prn) Do you need assistance with getting out of bed/getting out of a chair/moving?: Yes (daughter and private duty caregivers provide all care for patient) Do you have difficulty managing or taking your medications?: Yes (daughter and private duty caregivers provide all care for patient)  Follow up appointments reviewed: PCP Follow-up appointment confirmed?: No (sent message to [name] to schedule post-hospital follow up office visit, as per practice request) MD Provider Line Number:[phone] Given: No (verified well-established with current PCP) Specialist Hospital Follow-up appointment confirmed?: NA (verified not indicated per hospital discharging provider discharge notes) Do you need transportation to your follow-up appointment?: No Do you understand care options if your condition(s) worsen?: Yes-patient verbalized understanding  SDOH Interventions Today    Flowsheet Row Most Recent Value  SDOH Interventions   Food Insecurity Interventions Intervention Not Indicated  Transportation Interventions Intervention Not Indicated  [daughter reports patient does not leave home unless she is going to the hospital-- have virtual visits with PCP]      TOC Interventions Today    Flowsheet Row Most Recent Value  TOC Interventions   TOC Interventions Discussed/Reviewed TOC Interventions Discussed, Contacted provider for patient needs  [provided my direct contact information should questions/ concerns/ needs arise post-TOC call, prior to RN CM telephone visit]       Interventions Today    Flowsheet  Row Most Recent Value  Chronic Disease   Chronic disease during today's visit Other  [AMS- metabolic encephalopathy/ aspiration pneumonias in setting of dementia]  General Interventions   General Interventions Discussed/Reviewed General Interventions Discussed, Doctor Visits, [facility], Horticulturist, commercial (DME), Referral to Nurse, Communication with  [scheduled with RN CM  Care Coordinator for follow up telephone visit on 09/07/23,  resources for caregiver fatigue: today, daughter declined need for additional resources for caregiver support]  Doctor Visits Discussed/Reviewed PCP, Doctor Visits Discussed  Horticulturist, commercial (DME) Other, Wheelchair  [hospital bed, hoyer lift, purewick-- patient bed-bound]  Wheelchair Standard  PCP/Specialist Visits Compliance with follow-up visit  Communication with PCP/Specialists, RN  Education Interventions   Education Provided Provided Education  Provided Engineer, petroleum On Other, Medication, [facility], Nutrition  [id number] diet,  safe swallowing techniques/ strategies to prevent aspiration,  palliative vs. hospice care services]  Mental Health Interventions   Mental Health Discussed/Reviewed Other  [dementia]  Nutrition Interventions   Nutrition Discussed/Reviewed Nutrition Discussed  Pharmacy Interventions   Pharmacy Dicussed/Reviewed Pharmacy Topics Discussed  Safety Interventions   Safety Discussed/Reviewed Safety Discussed, Fall Risk, Home Safety  Home Engineer, water, Refer for community resources  [possibility of community palliative care vs. hospice services]      Caryl Pina, RN, BSN, CCRN Alumnus RN CM Care Coordination/ Transition of Care- [facility] Care Management [phone]: direct office

## 2023-08-23 ENCOUNTER — Telehealth: Payer: Self-pay

## 2023-08-23 DIAGNOSIS — I6523 Occlusion and stenosis of bilateral carotid arteries: Secondary | ICD-10-CM | POA: Diagnosis not present

## 2023-08-23 DIAGNOSIS — R29898 Other symptoms and signs involving the musculoskeletal system: Secondary | ICD-10-CM | POA: Diagnosis not present

## 2023-08-23 DIAGNOSIS — R5381 Other malaise: Secondary | ICD-10-CM | POA: Diagnosis not present

## 2023-08-23 DIAGNOSIS — I110 Hypertensive heart disease with heart failure: Secondary | ICD-10-CM | POA: Diagnosis not present

## 2023-08-23 DIAGNOSIS — E782 Mixed hyperlipidemia: Secondary | ICD-10-CM | POA: Diagnosis not present

## 2023-08-23 DIAGNOSIS — I5032 Chronic diastolic (congestive) heart failure: Secondary | ICD-10-CM | POA: Diagnosis not present

## 2023-08-23 DIAGNOSIS — I350 Nonrheumatic aortic (valve) stenosis: Secondary | ICD-10-CM | POA: Diagnosis not present

## 2023-08-23 DIAGNOSIS — E119 Type 2 diabetes mellitus without complications: Secondary | ICD-10-CM | POA: Diagnosis not present

## 2023-08-23 DIAGNOSIS — S72142D Displaced intertrochanteric fracture of left femur, subsequent encounter for closed fracture with routine healing: Secondary | ICD-10-CM | POA: Diagnosis not present

## 2023-08-23 NOTE — Telephone Encounter (Signed)
[facility] -Order [phone]

## 2023-08-24 LAB — CULTURE, BLOOD (SINGLE): Special Requests: ADEQUATE

## 2023-08-25 ENCOUNTER — Telehealth: Payer: Self-pay

## 2023-08-25 NOTE — Telephone Encounter (Signed)
Patient's daughter called and stated that she is needing to schedule a virtual hospital follow up with you for the patient. You have nothing available for the patient to be scheduled because for it to be a hospital follow up from when the patient was seen by the hospitalist today would be the last day to schedule it for insurance to pay for it. Please advise scheduling for the patient.

## 2023-08-29 DIAGNOSIS — S72142D Displaced intertrochanteric fracture of left femur, subsequent encounter for closed fracture with routine healing: Secondary | ICD-10-CM | POA: Diagnosis not present

## 2023-08-29 DIAGNOSIS — E119 Type 2 diabetes mellitus without complications: Secondary | ICD-10-CM | POA: Diagnosis not present

## 2023-08-29 DIAGNOSIS — R5381 Other malaise: Secondary | ICD-10-CM | POA: Diagnosis not present

## 2023-08-29 DIAGNOSIS — I5032 Chronic diastolic (congestive) heart failure: Secondary | ICD-10-CM | POA: Diagnosis not present

## 2023-08-29 DIAGNOSIS — I110 Hypertensive heart disease with heart failure: Secondary | ICD-10-CM | POA: Diagnosis not present

## 2023-08-29 DIAGNOSIS — R29898 Other symptoms and signs involving the musculoskeletal system: Secondary | ICD-10-CM | POA: Diagnosis not present

## 2023-08-29 DIAGNOSIS — I350 Nonrheumatic aortic (valve) stenosis: Secondary | ICD-10-CM | POA: Diagnosis not present

## 2023-08-29 DIAGNOSIS — E782 Mixed hyperlipidemia: Secondary | ICD-10-CM | POA: Diagnosis not present

## 2023-08-29 DIAGNOSIS — I6523 Occlusion and stenosis of bilateral carotid arteries: Secondary | ICD-10-CM | POA: Diagnosis not present

## 2023-08-29 NOTE — Telephone Encounter (Signed)
Scheduled. Dr. Tobie Poet

## 2023-08-30 NOTE — Progress Notes (Signed)
Virtual Visit via Video Note   This visit type was conducted either per patient request OR due to national recommendations for restrictions regarding the COVID-19 Pandemic (e.g. social distancing) in an effort to limit this patient's exposure and mitigate transmission in our community.  Due to her co-morbid illnesses, this patient is at least at moderate risk for complications without adequate follow up.  This format is felt to be most appropriate for this patient at this time.  All issues noted in this document were discussed and addressed.  A limited physical exam was performed with this format.  A verbal consent was obtained for the virtual visit.   Date:  09/03/2023   ID:  Christine Newton, Christine Newton 05/28/1933, MRN [id number]  Patient Location: Home Provider Location: Office/Clinic  PCP:  Blane Ohara, MD   Chief Complaint:  Hospital follow up  History of Present Illness:   Red text is current issues.  Christine Newton is a 89 y.o. female for hospital follow-up virtually for pneumonia.  Patient presented August 11, 2023 via EMS with right-sided weakness, confusion, and lethargy.  She also had a dry cough and some nasal congestion.  No fevers.  At that time an MRI of her brain showed no acute strokes.  CT of the chest showed possible pneumonia.  She was noted on admission to have some swelling of her left upper arm and lower leg however the patient's daughter who is with her the entire time said that the swelling was not when she came in with.  She had swelling develop through the day while she was in the emergency department after the infiltrate and numerous IVs. Discharge diagnoses as follows: Acute metabolic encephalopathy in the setting of underlying dementia and aspiration pneumonia  Failure to thrive and Deconditioning -Worsening mental status due to aspiration pneumonia, in the setting of this patient with underlying severe deconditioning, and dementia  - MRI brain nonacute, no  focal deficits, repeat CT on 08/14/2023.  nonacute, stable ABG, stable EEG - ammonia minimally elevated but does not explain her mental status, placed on lactulose.   -Aspiration pneumonia being treated. -Treated with IV Unasyn.   - Patient was seen by speech therapy, currently advanced to dysphagia 3 diet, mentation has improved, back to baseline. Patient is currently eating pureed diet. Eating about 5 tbsps of food with 4 oz water with each meal.   Per the daughter, she was given a prescription for Augmentin and told by the discharging hospitalist to keep on hand and if her symptoms were to worsen to restart.  This would be if the daughter was concerned she was developing pneumonia.  The discharge paperwork says to go ahead and take the Augmentin.  She has not given her this.  The patient has had occasional coughing.  The day after discharge patient had a temperature up to 101 but no further fever.   Dehydration and hypokalemia.  -Resolved with IV fluids. Swollen left sided upper and lower extremities.  - Venous duplex negative for upper and lower extremity nonacute.  Incidental thyroid nodule.  Outpatient thyroid ultrasound by PCP as desired. I would not recommend further work up. Hypertension.  Blood pressure has been soft, so no meds at time of discharge.  Held lisinopril on discharge.  Her daughter or her caretaker take vitals signs daily.  Over the last week her blood pressure has gradually increased to 150-170s over 90s off her bp meds.  History of chronic diastolic CHF, previous history of aortic  valve replacement, history of underlying left bundle branch block.  Compensated, no acute issues.  EF 60% on echocardiogram 1 year ago, monitor. Dyslipidemia.  On statin. History of SVT - continued home metoprolol History of seizures -Continue with home dose Depakote  Since discharge patient is more tired.  She has decreased activity.  She responds to questions and directions but not as quickly and  sometimes she does not seem to understand. She still has occupational therapy, physical therapy, and home health care nursing coming out.   Daughter and Sister are considering hospice, but they prefer to present it to Christine Newton as another care team. Patient is a DNR. It is posted in 2 places in her home. Her daughter, Christine Newton, is her main caretaker and is still working full time as a Psychologist, forensic.  She has a paid caretaker and Christine Newton's sister is visiting currently.  History of multiple strokes of left frontal and parietal lobes (08/23/2022) persistent right partial hemiparesis.    Past Medical History:  Diagnosis Date   Altered mental status 08/11/2023   Closed fracture of left hip (HCC) 10/02/2022   Closed fracture of left inferior and superior pubic ramus (HCC) 01/19/2022   Closed left femoral fracture (HCC) 10/01/2022   Diabetes (HCC)    History of colon cancer 11/04/2013   Hypertension    Hypertensive heart disease with chronic diastolic congestive heart failure (HCC) 11/04/2013   Nonrheumatic aortic (valve) stenosis 09/21/2020   Paroxysmal SVT (supraventricular tachycardia) 01/31/2023    Past Surgical History:  Procedure Laterality Date   CARDIAC VALVE REPLACEMENT     CHOLECYSTECTOMY     INTRAMEDULLARY (IM) NAIL INTERTROCHANTERIC Left 10/02/2022   Procedure: INTRAMEDULLARY (IM) NAIL INTERTROCHANTERIC;  Surgeon: Huel Cote, MD;  Location: [facility] OR;  Service: Orthopedics;  Laterality: Left;    History reviewed. No pertinent family history.  Social History   Socioeconomic History   Marital status: Widowed    Spouse name: Not on file   Number of children: 2   Years of education: 12 + 4   Highest education level: Not on file  Occupational History   Occupation: RETIRED SOCIAL WORKER  Tobacco Use   Smoking status: Never   Smokeless tobacco: Never  Vaping Use   Vaping status: Never Used  Substance and Sexual Activity   Alcohol use: Never   Drug use: Never   Sexual  activity: Not Currently  Other Topics Concern   Not on file  Social History Narrative   Not on file   Social Determinants of Health   Financial Resource Strain: Not on file  Food Insecurity: No Food Insecurity (08/22/2023)   Hunger Vital Sign    Worried About Running Out of Food in the Last Year: Never true    Ran Out of Food in the Last Year: Never true  Transportation Needs: No Transportation Needs (08/22/2023)   PRAPARE - Administrator, Civil Service (Medical): No    Lack of Transportation (Non-Medical): No  Physical Activity: Not on file  Stress: Not on file  Social Connections: Not on file  Intimate Partner Violence: Not At Risk (08/12/2023)   Humiliation, Afraid, Rape, and Kick questionnaire    Fear of Current or Ex-Partner: No    Emotionally Abused: No    Physically Abused: No    Sexually Abused: No    Outpatient Medications Prior to Visit  Medication Sig Dispense Refill   amoxicillin-clavulanate (AUGMENTIN) 875-125 MG tablet Take 1 tablet by mouth 2 (two) times daily. (  Patient not taking: Reported on 08/22/2023) 14 tablet 0   aspirin EC 81 MG tablet Take 81 mg by mouth daily. Swallow whole.     atorvastatin (LIPITOR) 40 MG tablet TAKE 1 TABLET BY MOUTH EVERY DAY (Patient not taking: Reported on 08/22/2023) 90 tablet 1   divalproex (DEPAKOTE) 250 MG DR tablet Take 1 tablet (250 mg total) by mouth every evening.     erythromycin ophthalmic ointment Place 1 Application into the right eye daily as needed (eye drainage).     Guaifenesin (MUCINEX MAXIMUM STRENGTH) 1200 MG TB12 Take 1,200 mg by mouth daily as needed (chest congestion).     lactulose (CHRONULAC) 10 GM/15ML solution Take 30 mLs (20 g total) by mouth 2 (two) times daily. Please hold if more than 2 BMs per day (Patient not taking: Reported on 08/22/2023) 946 mL 0   metoprolol tartrate (LOPRESSOR) 25 MG tablet TAKE 1/2 TABLET TWICE A DAY BY MOUTH 90 tablet 0   No facility-administered medications prior to  visit.    Allergies  Allergen Reactions   Cephalosporins Nausea Only     Social History   Tobacco Use   Smoking status: Never   Smokeless tobacco: Never  Vaping Use   Vaping status: Never Used  Substance Use Topics   Alcohol use: Never   Drug use: Never     ROS  Denies pain.  Denies fever.  No shortness of breath. Decreased appetite. Decreased cognition. Denies bed sores.    Labs/Other Tests and Data Reviewed:    Recent Labs: 08/11/2023: ALT 18 08/15/2023: TSH 0.518 08/18/2023: B Natriuretic Peptide 314.1; BUN 10; Creatinine, Ser 0.55; Hemoglobin 11.0; Magnesium 1.8; Platelets 279; Potassium 4.1; Sodium 143   Recent Lipid Panel Lab Results  Component Value Date/Time   CHOL 167 07/12/2022 03:20 PM   TRIG 99 07/12/2022 03:20 PM   HDL 48 07/12/2022 03:20 PM   CHOLHDL 3.5 07/12/2022 03:20 PM   CHOLHDL 4.1 12/14/2020 04:31 AM   LDLCALC 101 (H) 07/12/2022 03:20 PM    Wt Readings from Last 3 Encounters:  08/12/23 132 lb 7.9 oz (60.1 kg)  06/16/23 140 lb (63.5 kg)  01/31/23 145 lb (65.8 kg)     Objective:    Vital Signs:  BP (!) 150/90   Pulse 70   Temp (!) 96.1 F (35.6 C)    Physical Exam Vitals reviewed.  Constitutional:      Comments: Paid is bed ridden. In a hospital bed. Rouses to speech. She is able to lift her left arm and left leg for brief periods. Not able to go through FROM.   Right arm and leg she has more difficulty moving. She can raise her right arm briefly off the bed.        ASSESSMENT & PLAN:   Acute metabolic encephalopathy Assessment & Plan: Improved. This is on top of dementia.    Aspiration pneumonia of left upper lobe due to regurgitated food [facility]) Assessment & Plan: Resolved.  If begins to have a fever, shortness of breath, or chest pain, have home health care nurse check patient or call our office.    Hypertensive heart disease with chronic diastolic congestive heart failure (HCC) Assessment & Plan: Uncontrolled. Restart  lisinopril 10 mg daily.  Continue to check bp daily.     Seizure disorder [facility]) Assessment & Plan: Continue depakote 250 mg daily.    Moderate dementia without behavioral disturbance, psychotic disturbance, mood disturbance, or anxiety, unspecified dementia type [facility]) Assessment & Plan: Secondary  to strokes.  Worsened. Continue aspirin 81 mg daily.   Orders: -     Ambulatory referral to Hospice  History of multiple strokes Assessment & Plan: hemiparesis   Hemiparesis of right dominant side as late effect of cerebral infarction [facility]) Assessment & Plan: Causes clear dysfunction.   Orders: -     Ambulatory referral to Hospice  Physical deconditioning Assessment & Plan: Currently getting home health care.  [facility] to [facility].   Orders: -     Ambulatory referral to Hospice  Severe protein-calorie malnutrition (HCC) Assessment & Plan: Albumin low. Continued to lose weight while in the hospital.  Decreased oral intake.  Recommended to continue to encourage oral intake.  Orders: -     Ambulatory referral to Hospice  Thyroid nodule Assessment & Plan: No further work up recommended due to poor surgical candidate.    Other orders -     Lisinopril; Take 1 tablet (10 mg total) by mouth daily.     Orders Placed This Encounter  Procedures   Ambulatory referral to Hospice     Meds ordered this encounter  Medications   lisinopril (ZESTRIL) 10 MG tablet    Sig: Take 1 tablet (10 mg total) by mouth daily.    I spent 45 minutes dedicated to the care of this patient on the date of this encounter to include face-to-face time with the patient.  Follow Up:  Virtual Visit  in 6 weeks.  SignedBlane Ohara, MD  09/03/2023 5:46 PM    [facility] [place]

## 2023-08-31 ENCOUNTER — Telehealth: Payer: Medicare PPO | Admitting: Family Medicine

## 2023-08-31 ENCOUNTER — Encounter: Payer: Self-pay | Admitting: Family Medicine

## 2023-08-31 VITALS — BP 150/90 | HR 70 | Temp 96.1°F

## 2023-08-31 DIAGNOSIS — S72142D Displaced intertrochanteric fracture of left femur, subsequent encounter for closed fracture with routine healing: Secondary | ICD-10-CM | POA: Diagnosis not present

## 2023-08-31 DIAGNOSIS — E782 Mixed hyperlipidemia: Secondary | ICD-10-CM | POA: Diagnosis not present

## 2023-08-31 DIAGNOSIS — I5032 Chronic diastolic (congestive) heart failure: Secondary | ICD-10-CM

## 2023-08-31 DIAGNOSIS — I350 Nonrheumatic aortic (valve) stenosis: Secondary | ICD-10-CM | POA: Diagnosis not present

## 2023-08-31 DIAGNOSIS — J690 Pneumonitis due to inhalation of food and vomit: Secondary | ICD-10-CM | POA: Diagnosis not present

## 2023-08-31 DIAGNOSIS — I110 Hypertensive heart disease with heart failure: Secondary | ICD-10-CM | POA: Diagnosis not present

## 2023-08-31 DIAGNOSIS — I69351 Hemiplegia and hemiparesis following cerebral infarction affecting right dominant side: Secondary | ICD-10-CM

## 2023-08-31 DIAGNOSIS — G40909 Epilepsy, unspecified, not intractable, without status epilepticus: Secondary | ICD-10-CM

## 2023-08-31 DIAGNOSIS — E43 Unspecified severe protein-calorie malnutrition: Secondary | ICD-10-CM

## 2023-08-31 DIAGNOSIS — F03B0 Unspecified dementia, moderate, without behavioral disturbance, psychotic disturbance, mood disturbance, and anxiety: Secondary | ICD-10-CM | POA: Diagnosis not present

## 2023-08-31 DIAGNOSIS — E041 Nontoxic single thyroid nodule: Secondary | ICD-10-CM | POA: Diagnosis not present

## 2023-08-31 DIAGNOSIS — R5381 Other malaise: Secondary | ICD-10-CM

## 2023-08-31 DIAGNOSIS — E119 Type 2 diabetes mellitus without complications: Secondary | ICD-10-CM | POA: Diagnosis not present

## 2023-08-31 DIAGNOSIS — R29898 Other symptoms and signs involving the musculoskeletal system: Secondary | ICD-10-CM | POA: Diagnosis not present

## 2023-08-31 DIAGNOSIS — G9341 Metabolic encephalopathy: Secondary | ICD-10-CM | POA: Diagnosis not present

## 2023-08-31 DIAGNOSIS — R4182 Altered mental status, unspecified: Secondary | ICD-10-CM

## 2023-08-31 DIAGNOSIS — Z8673 Personal history of transient ischemic attack (TIA), and cerebral infarction without residual deficits: Secondary | ICD-10-CM

## 2023-08-31 DIAGNOSIS — I6523 Occlusion and stenosis of bilateral carotid arteries: Secondary | ICD-10-CM | POA: Diagnosis not present

## 2023-08-31 MED ORDER — LISINOPRIL 10 MG PO TABS: 10.0000 mg | ORAL_TABLET | Freq: Every day | ORAL | Status: DC

## 2023-09-03 ENCOUNTER — Encounter: Payer: Self-pay | Admitting: Family Medicine

## 2023-09-03 DIAGNOSIS — E43 Unspecified severe protein-calorie malnutrition: Secondary | ICD-10-CM | POA: Insufficient documentation

## 2023-09-03 DIAGNOSIS — I69351 Hemiplegia and hemiparesis following cerebral infarction affecting right dominant side: Secondary | ICD-10-CM | POA: Insufficient documentation

## 2023-09-03 DIAGNOSIS — E041 Nontoxic single thyroid nodule: Secondary | ICD-10-CM | POA: Insufficient documentation

## 2023-09-03 DIAGNOSIS — G9341 Metabolic encephalopathy: Secondary | ICD-10-CM | POA: Insufficient documentation

## 2023-09-03 NOTE — Assessment & Plan Note (Signed)
Continue depakote 250 mg daily.

## 2023-09-03 NOTE — Assessment & Plan Note (Signed)
Currently getting home health care.  Refer to [facility].

## 2023-09-03 NOTE — Assessment & Plan Note (Signed)
No further work up recommended due to poor surgical candidate.

## 2023-09-03 NOTE — Assessment & Plan Note (Signed)
Albumin low. Continued to lose weight while in the hospital.  Decreased oral intake.  Recommended to continue to encourage oral intake.

## 2023-09-03 NOTE — Assessment & Plan Note (Signed)
hemiparesis

## 2023-09-03 NOTE — Assessment & Plan Note (Addendum)
Uncontrolled. Restart lisinopril 10 mg daily.  Continue to check bp daily.

## 2023-09-03 NOTE — Assessment & Plan Note (Signed)
Improved. This is on top of dementia.

## 2023-09-03 NOTE — Assessment & Plan Note (Signed)
Causes clear dysfunction.

## 2023-09-03 NOTE — Assessment & Plan Note (Signed)
Resolved.  If begins to have a fever, shortness of breath, or chest pain, have home health care nurse check patient or call our office.

## 2023-09-03 NOTE — Assessment & Plan Note (Signed)
Secondary to strokes.  Worsened. Continue aspirin 81 mg daily.

## 2023-09-04 DIAGNOSIS — S72142D Displaced intertrochanteric fracture of left femur, subsequent encounter for closed fracture with routine healing: Secondary | ICD-10-CM | POA: Diagnosis not present

## 2023-09-04 DIAGNOSIS — E782 Mixed hyperlipidemia: Secondary | ICD-10-CM | POA: Diagnosis not present

## 2023-09-04 DIAGNOSIS — R29898 Other symptoms and signs involving the musculoskeletal system: Secondary | ICD-10-CM | POA: Diagnosis not present

## 2023-09-04 DIAGNOSIS — R5381 Other malaise: Secondary | ICD-10-CM | POA: Diagnosis not present

## 2023-09-04 DIAGNOSIS — E119 Type 2 diabetes mellitus without complications: Secondary | ICD-10-CM | POA: Diagnosis not present

## 2023-09-04 DIAGNOSIS — I110 Hypertensive heart disease with heart failure: Secondary | ICD-10-CM | POA: Diagnosis not present

## 2023-09-04 DIAGNOSIS — I6523 Occlusion and stenosis of bilateral carotid arteries: Secondary | ICD-10-CM | POA: Diagnosis not present

## 2023-09-04 DIAGNOSIS — I350 Nonrheumatic aortic (valve) stenosis: Secondary | ICD-10-CM | POA: Diagnosis not present

## 2023-09-04 DIAGNOSIS — I5032 Chronic diastolic (congestive) heart failure: Secondary | ICD-10-CM | POA: Diagnosis not present

## 2023-09-07 ENCOUNTER — Ambulatory Visit: Payer: Self-pay

## 2023-09-07 DIAGNOSIS — I110 Hypertensive heart disease with heart failure: Secondary | ICD-10-CM | POA: Diagnosis not present

## 2023-09-07 DIAGNOSIS — R29898 Other symptoms and signs involving the musculoskeletal system: Secondary | ICD-10-CM | POA: Diagnosis not present

## 2023-09-07 DIAGNOSIS — S72142D Displaced intertrochanteric fracture of left femur, subsequent encounter for closed fracture with routine healing: Secondary | ICD-10-CM | POA: Diagnosis not present

## 2023-09-07 DIAGNOSIS — I350 Nonrheumatic aortic (valve) stenosis: Secondary | ICD-10-CM | POA: Diagnosis not present

## 2023-09-07 DIAGNOSIS — E119 Type 2 diabetes mellitus without complications: Secondary | ICD-10-CM | POA: Diagnosis not present

## 2023-09-07 DIAGNOSIS — I6523 Occlusion and stenosis of bilateral carotid arteries: Secondary | ICD-10-CM | POA: Diagnosis not present

## 2023-09-07 DIAGNOSIS — E782 Mixed hyperlipidemia: Secondary | ICD-10-CM | POA: Diagnosis not present

## 2023-09-07 DIAGNOSIS — I5032 Chronic diastolic (congestive) heart failure: Secondary | ICD-10-CM | POA: Diagnosis not present

## 2023-09-07 DIAGNOSIS — R5381 Other malaise: Secondary | ICD-10-CM | POA: Diagnosis not present

## 2023-09-07 NOTE — Patient Outreach (Signed)
  Care Coordination   Initial Visit Note   09/07/2023 Name: Christine Newton MRN: [id number] DOB: May 03, 1933  Christine Newton is a 89 y.o. year old female who sees [name], [name], MD for primary care. I spoke with  Christine Newton by phone today. Placed call to daughter per visit notes.   What matters to the patients health and wellness today?  Placed call to daughter who states that is she in the bed with  Covid and she can not talk.  Request I reschedule appointment. Appointment rescheduled for next.      SDOH assessments and interventions completed:  No     Care Coordination Interventions:  Yes, provided  Appointment rescheduled Follow up plan: Follow up call scheduled for 09/14/2023    Encounter Outcome:  Patient Visit Completed   Lonia Chimera, RN, BSN, CEN [facility] Coordinator [phone]

## 2023-09-11 DIAGNOSIS — E782 Mixed hyperlipidemia: Secondary | ICD-10-CM | POA: Diagnosis not present

## 2023-09-11 DIAGNOSIS — I350 Nonrheumatic aortic (valve) stenosis: Secondary | ICD-10-CM | POA: Diagnosis not present

## 2023-09-11 DIAGNOSIS — I110 Hypertensive heart disease with heart failure: Secondary | ICD-10-CM | POA: Diagnosis not present

## 2023-09-11 DIAGNOSIS — S72142D Displaced intertrochanteric fracture of left femur, subsequent encounter for closed fracture with routine healing: Secondary | ICD-10-CM | POA: Diagnosis not present

## 2023-09-11 DIAGNOSIS — I5032 Chronic diastolic (congestive) heart failure: Secondary | ICD-10-CM | POA: Diagnosis not present

## 2023-09-11 DIAGNOSIS — R29898 Other symptoms and signs involving the musculoskeletal system: Secondary | ICD-10-CM | POA: Diagnosis not present

## 2023-09-11 DIAGNOSIS — R5381 Other malaise: Secondary | ICD-10-CM | POA: Diagnosis not present

## 2023-09-11 DIAGNOSIS — I6523 Occlusion and stenosis of bilateral carotid arteries: Secondary | ICD-10-CM | POA: Diagnosis not present

## 2023-09-11 DIAGNOSIS — E119 Type 2 diabetes mellitus without complications: Secondary | ICD-10-CM | POA: Diagnosis not present

## 2023-09-14 ENCOUNTER — Other Ambulatory Visit (HOSPITAL_COMMUNITY): Payer: Self-pay

## 2023-09-14 ENCOUNTER — Ambulatory Visit: Payer: Self-pay

## 2023-09-14 NOTE — Patient Outreach (Signed)
Care Coordination   Initial Visit Note   09/14/2023 Name: Christine Newton MRN: [id number] DOB: 09-28-1933  Christine Newton is a 89 y.o. year old female who sees [name], [name], MD for primary care. I spoke with  Reona Shiver Ludlam by phone today.  What matters to the patients health and wellness today?  Spoke with daughter today for initial assessment.  ( Vickie Barton Fanny) After review of EMR noted that patient was referred to hospice.  Daughter reports hospice nurse is coming to do an assessment tomorrow.  Reviewed with daughter if patient/ family chose to go with hospice, that hospice would provide all needed services.  After discussion with daughter, I will plan to call patient/ daughter back on 09/18/2023 at 830 am for update.     SDOH assessments and interventions completed:  No     Care Coordination Interventions:  Yes, provided   Interventions Today    Flowsheet Row Most Recent Value  Chronic Disease   Chronic disease during today's visit Diabetes, Other  [h/o stroke, dementia]  General Interventions   General Interventions Discussed/Reviewed General Interventions Discussed, Level of Care, Doctor Visits  Doctor Visits Discussed/Reviewed Doctor Visits Discussed, Doctor Visits Reviewed, PCP, Specialist  [reviewed EMR and noted first diagnosis of dementia on12/19/2021]  Level of Care --  [? hospice]  Exercise Interventions   Exercise Discussed/Reviewed Physical Activity  [family doing passive ROM exercises]  Education Interventions   Education Provided Provided Education  Provided Verbal Education On Other  [Reviewed hospice program.]  Mental Health Interventions   Mental Health Discussed/Reviewed Other  [provided listening ear and support to daughter]  Nutrition Interventions   Nutrition Discussed/Reviewed Nutrition Discussed  [reviewed improved appetite]        Follow up plan: Follow up call scheduled for 09/18/2023    Encounter Outcome:  Patient Visit  Completed   Lonia Chimera, RN, BSN, CEN [facility] Coordinator [phone]

## 2023-09-18 ENCOUNTER — Telehealth: Payer: Self-pay

## 2023-09-18 ENCOUNTER — Ambulatory Visit: Payer: Self-pay

## 2023-09-18 NOTE — Patient Outreach (Signed)
Care Coordination   Follow Up Visit Note   09/18/2023 Name: Christine Newton MRN: [id number] DOB: 1933/08/16  Christine Newton is a 89 y.o. year old female who sees [name], [name], MD for primary care. I spoke with  Christine Newton by phone today.  What matters to the patients health and wellness today?  Incoming call from daughter who confirms patient is on hospice care at this time.  MD notified by secured chat  .    SDOH assessments and interventions completed:  No     Care Coordination Interventions:  No, not indicated   Follow up plan: No further intervention required.   Encounter Outcome:  Patient Visit Completed {THN Tip this will not be part of the note when signed-REQUIRED REPORT FIELD DO NOT DELETE (Optional):[id number]  Lonia Chimera, RN, BSN, CEN [facility] Coordinator [phone]

## 2023-09-18 NOTE — Patient Outreach (Signed)
Care Coordination   Follow Up Visit Note   09/18/2023 Name: Christine Newton MRN: [id number] DOB: 04-10-1933  Christine Newton is a 89 y.o. year old female who sees [name], [name], MD for primary care. I  was unable to reach daughter today as scheduled. Placed call to [facility].   What matters to the patients health and wellness today?  Patient was admitted to [facility] on 09/15/2023.     SDOH assessments and interventions completed:  No     Care Coordination Interventions:  No, not indicated   Follow up plan: No further intervention required.   Encounter Outcome:  Patient Visit Completed   Lonia Chimera, RN, BSN, CEN [facility] Coordinator [phone]

## 2023-10-02 ENCOUNTER — Telehealth: Payer: Self-pay | Admitting: Family Medicine

## 2023-10-06 ENCOUNTER — Telehealth: Payer: Medicare PPO | Admitting: Family Medicine

## 2023-10-27 NOTE — Telephone Encounter (Signed)
HOSPICE CALLED THIS MORNING STATING THIS PT PASSED AWAY EARLY THIS MORNING AT HOME.

## 2023-10-27 DEATH — deceased
